# Patient Record
Sex: Female | Born: 1935
Health system: Southern US, Community
[De-identification: ages and names within clinical notes are randomized; demographics above are authoritative.]

## PROBLEM LIST (undated history)

## (undated) DIAGNOSIS — R7303 Prediabetes: Secondary | ICD-10-CM

## (undated) DIAGNOSIS — J302 Other seasonal allergic rhinitis: Secondary | ICD-10-CM

## (undated) DIAGNOSIS — I10 Essential (primary) hypertension: Secondary | ICD-10-CM

## (undated) DIAGNOSIS — E119 Type 2 diabetes mellitus without complications: Secondary | ICD-10-CM

## (undated) DIAGNOSIS — G459 Transient cerebral ischemic attack, unspecified: Secondary | ICD-10-CM

## (undated) DIAGNOSIS — Z8679 Personal history of other diseases of the circulatory system: Secondary | ICD-10-CM

## (undated) DIAGNOSIS — M1990 Unspecified osteoarthritis, unspecified site: Secondary | ICD-10-CM

## (undated) DIAGNOSIS — Z87891 Personal history of nicotine dependence: Secondary | ICD-10-CM

## (undated) DIAGNOSIS — N189 Chronic kidney disease, unspecified: Secondary | ICD-10-CM

## (undated) HISTORY — DX: Personal history of nicotine dependence: Z87.891

## (undated) HISTORY — DX: Transient cerebral ischemic attack, unspecified: G45.9

## (undated) HISTORY — DX: Essential (primary) hypertension: I10

## (undated) HISTORY — PX: TONSILLECTOMY: SUR1361

---

## 1997-08-22 ENCOUNTER — Ambulatory Visit (HOSPITAL_COMMUNITY): Admission: RE | Admit: 1997-08-22 | Discharge: 1997-08-22 | Payer: Self-pay | Admitting: Cardiology

## 2000-04-17 ENCOUNTER — Encounter (INDEPENDENT_AMBULATORY_CARE_PROVIDER_SITE_OTHER): Payer: Self-pay | Admitting: *Deleted

## 2000-04-17 ENCOUNTER — Ambulatory Visit (HOSPITAL_COMMUNITY): Admission: RE | Admit: 2000-04-17 | Discharge: 2000-04-17 | Payer: Self-pay | Admitting: Gastroenterology

## 2000-07-08 ENCOUNTER — Encounter: Admission: RE | Admit: 2000-07-08 | Discharge: 2000-07-08 | Payer: Self-pay | Admitting: Family Medicine

## 2000-07-08 ENCOUNTER — Encounter: Payer: Self-pay | Admitting: Family Medicine

## 2003-12-07 ENCOUNTER — Ambulatory Visit (HOSPITAL_COMMUNITY): Admission: RE | Admit: 2003-12-07 | Discharge: 2003-12-07 | Payer: Self-pay | Admitting: Gastroenterology

## 2005-12-10 ENCOUNTER — Ambulatory Visit (HOSPITAL_COMMUNITY): Admission: RE | Admit: 2005-12-10 | Discharge: 2005-12-10 | Payer: Self-pay | Admitting: Cardiology

## 2005-12-10 IMAGING — CT CT HEAD WO/W CM
2 of 3 series · 16 of 30 positions shown, 18 images · IV contrast (omnipaque)
Comparison: Report of MRI of [DATE].

CLINICAL DATA: 80-year-old female with recurrent TIAs and visual disturbance.  Decreased vision in the left eye.   No eye bleed or CVA. 
 HEAD CT WITHOUT AND WITH CONTRAST:
TECHNIQUE: Contiguous axial images were obtained from the base of the skull through the vertex according to standard protocol before and after administration of intravenous contrast.
 Contrast:  100 mL Omnipaque 300.

[Series 2: brain · axial · 0.47mm/px · z∈[+166,+274]mm · 8 of 28 slices shown, 10 images (1 of 2)]
[im 4/28  brain]
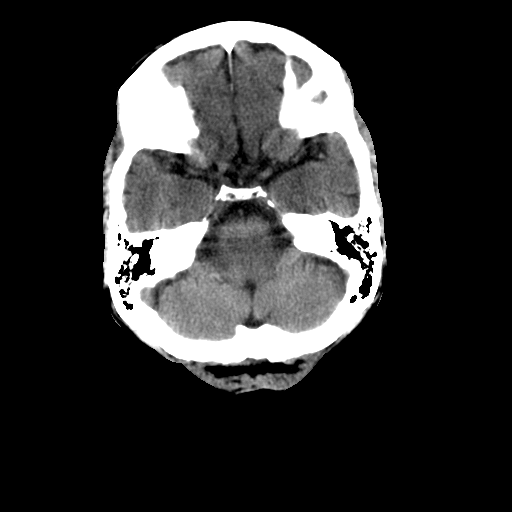
[im 4/28  bone]
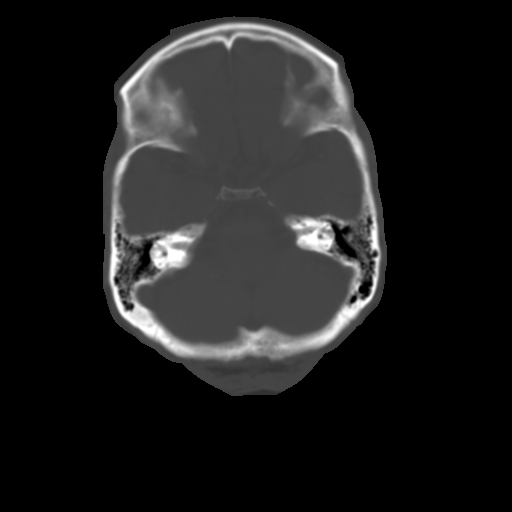
[im 7/28  brain]
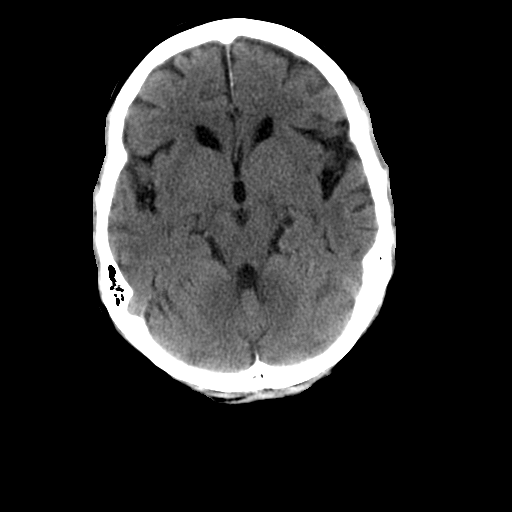
[im 10/28  brain]
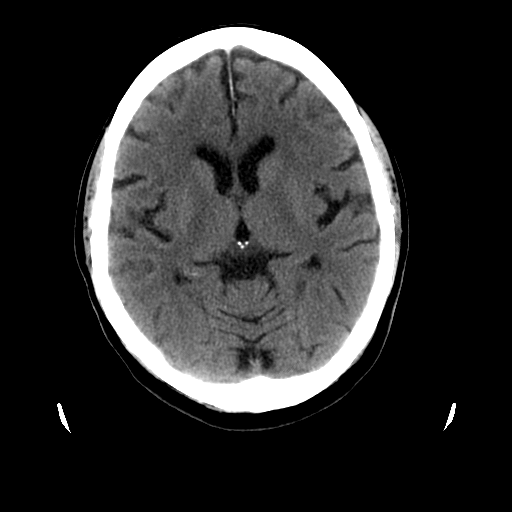
[im 13/28  brain]
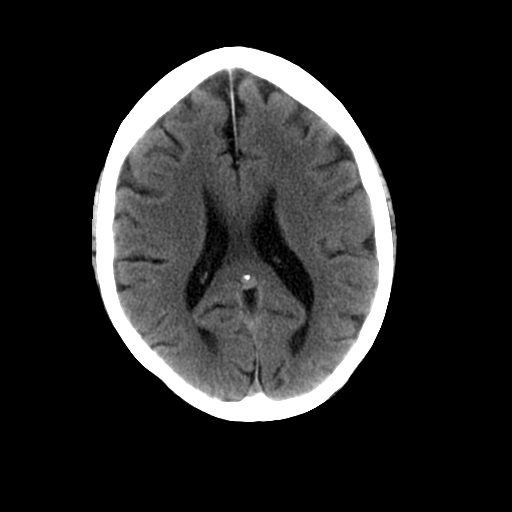
[im 16/28  brain]
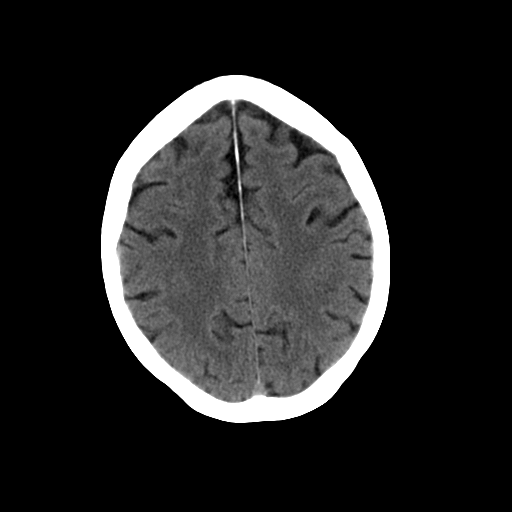
[im 16/28  bone]
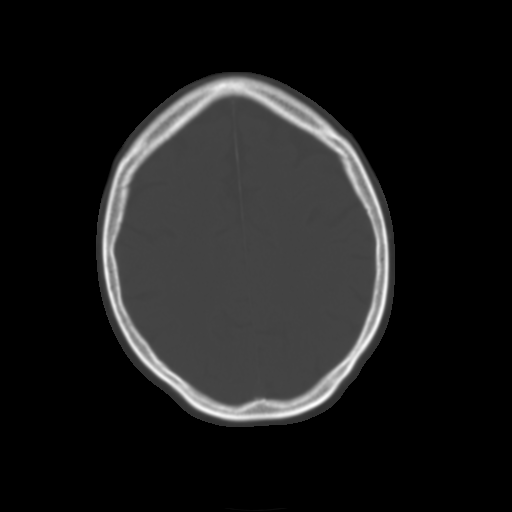
[im 19/28  brain]
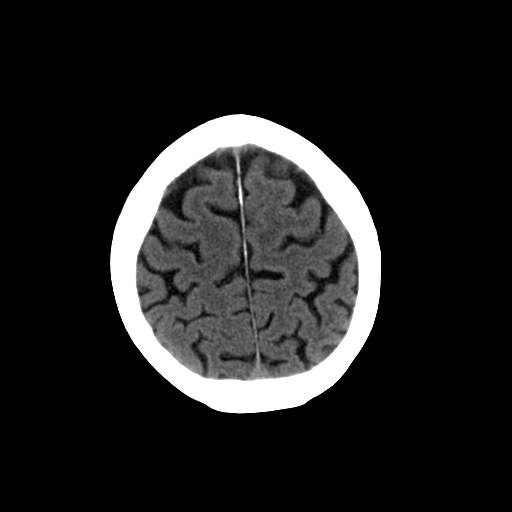
[im 22/28  brain]
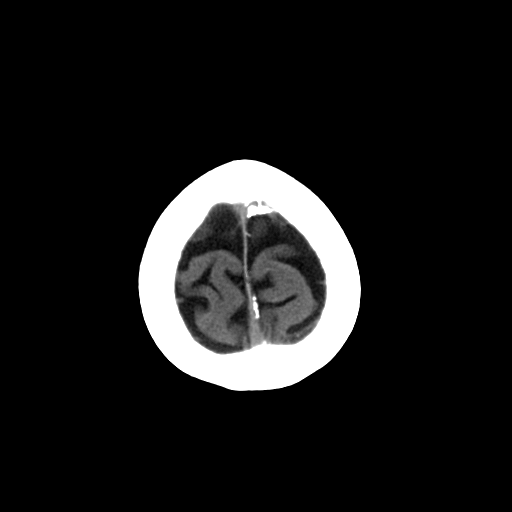
[im 25/28  brain]
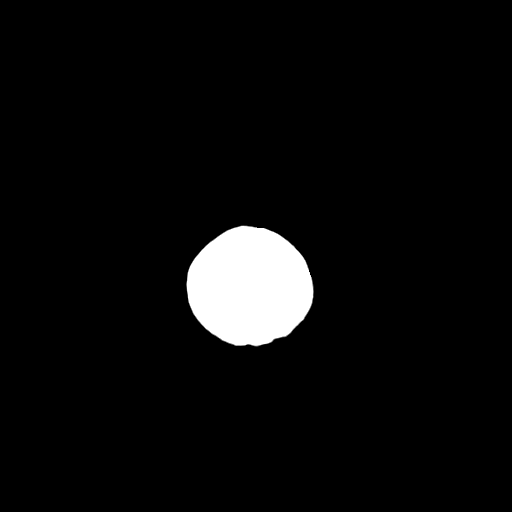

[Series 3: brain · axial · 0.47mm/px · z∈[+166,+274]mm · 8 of 28 slices shown (2 of 2)]
[im 4/28  brain]
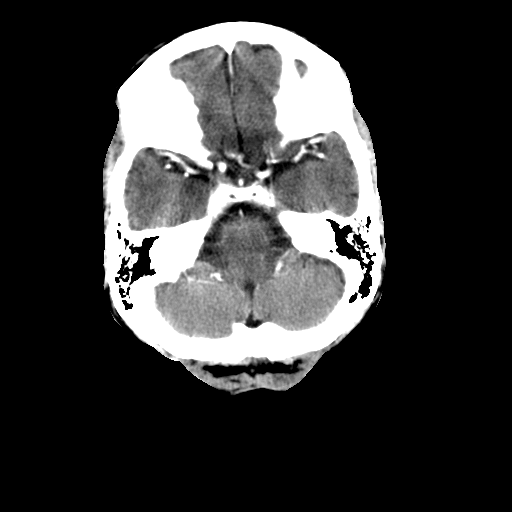
[im 7/28  brain]
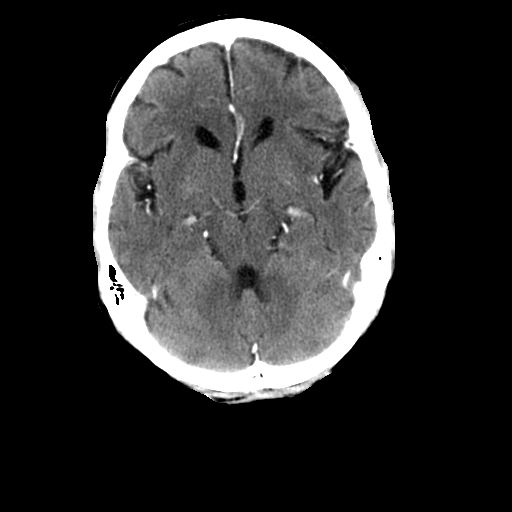
[im 10/28  brain]
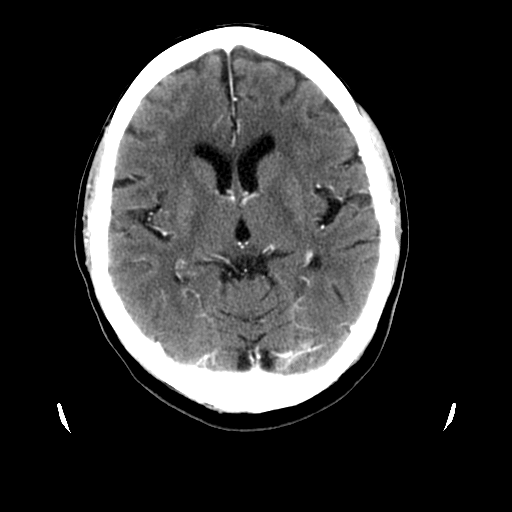
[im 13/28  brain]
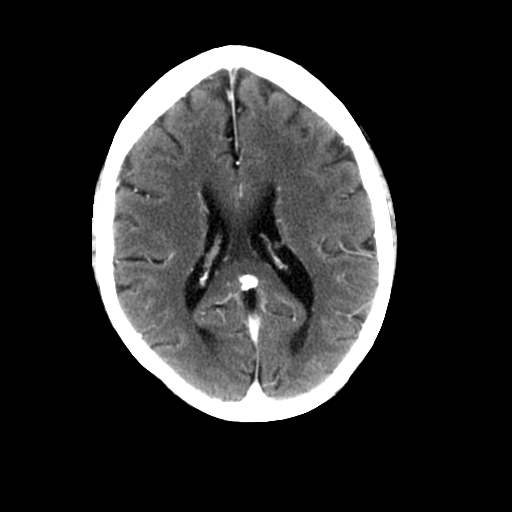
[im 16/28  brain]
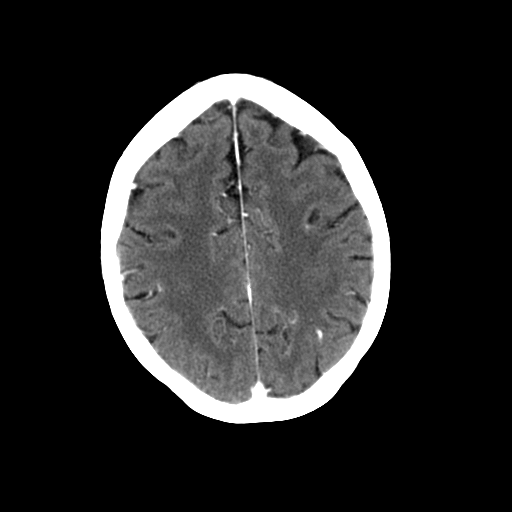
[im 19/28  brain]
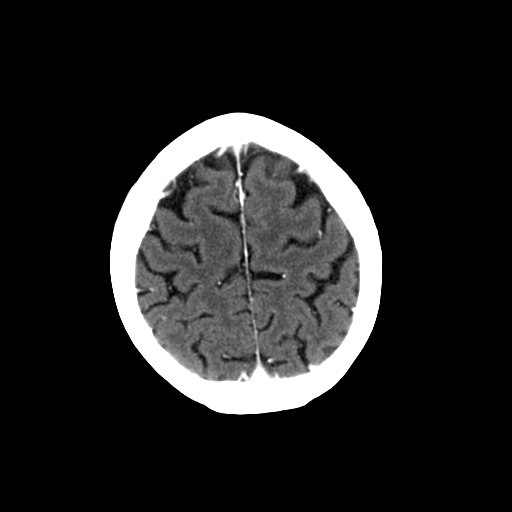
[im 22/28  brain]
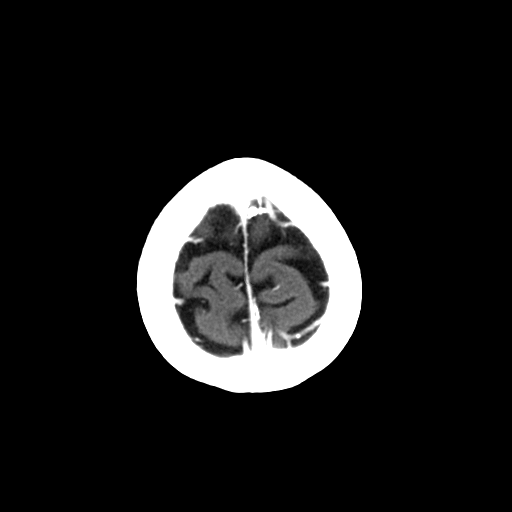
[im 25/28  brain]
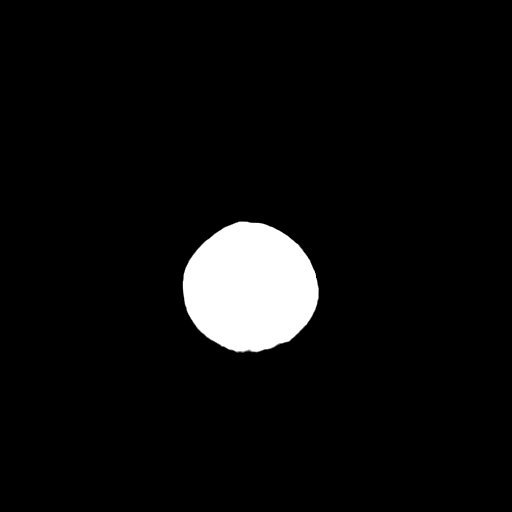

[16 of 30 positions shown; findings below may reference images not displayed]

Images are no longer available.  Of interest, the patient's complaint at the time of that MRI scan was blurred vision in the left eye.
FINDINGS: No acute intracranial abnormality is present.  Specifically, there is no evidence for acute infarct, hemorrhage, mass, hydrocephalus, or extraaxial fluid collection.  There are scattered ill-defined hypodensities in the subcortical white matter bilaterally.  The most prominent area is in the left frontal operculum.  The basal ganglia are intact.  The globes are not imaged. 
 Postcontrast images demonstrate no areas of pathologic enhancement. 
 The paranasal sinuses and mastoid air cells are clear.
IMPRESSION: 1.  Minimal subcortical white matter disease.
 2.  No acute intracranial abnormality or areas of pathologic enhancement. 
 3.  CT is known to be insensitive for acute infarcts in the first 24-48 hours.  MRI would be more sensitive and specific for the workup of TIA if clinically indicated.

## 2005-12-18 HISTORY — PX: TRANSTHORACIC ECHOCARDIOGRAM: SHX275

## 2005-12-24 ENCOUNTER — Encounter: Admission: RE | Admit: 2005-12-24 | Discharge: 2005-12-24 | Payer: Self-pay | Admitting: Family Medicine

## 2005-12-24 IMAGING — CR DG SHOULDER 2+V*L*
3 series · 3 of 3 positions shown · non-contrast
Comparison: none

CLINICAL DATA: Three weeks left shoulder pain.  No specific injury. 
 LEFT SHOULDER THREE VIEWS:
 There is no evidence of fracture or dislocation.  No other significant bone or soft tissue abnormalities are identified. 
 IMPRESSION
 Normal study.

[w shoulder ap internal left]
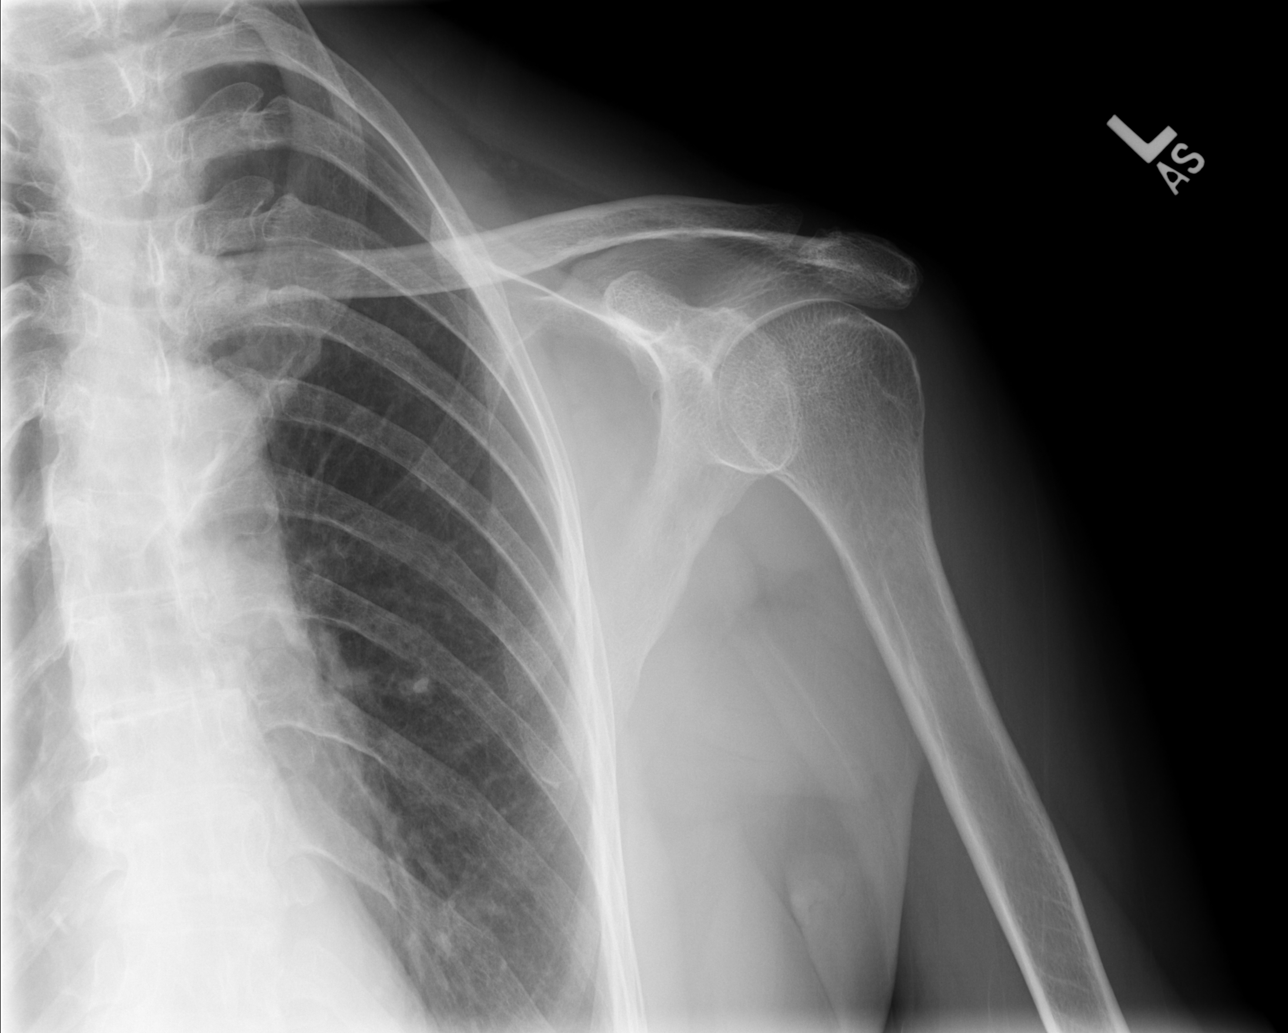

[w shoulder ap external left]
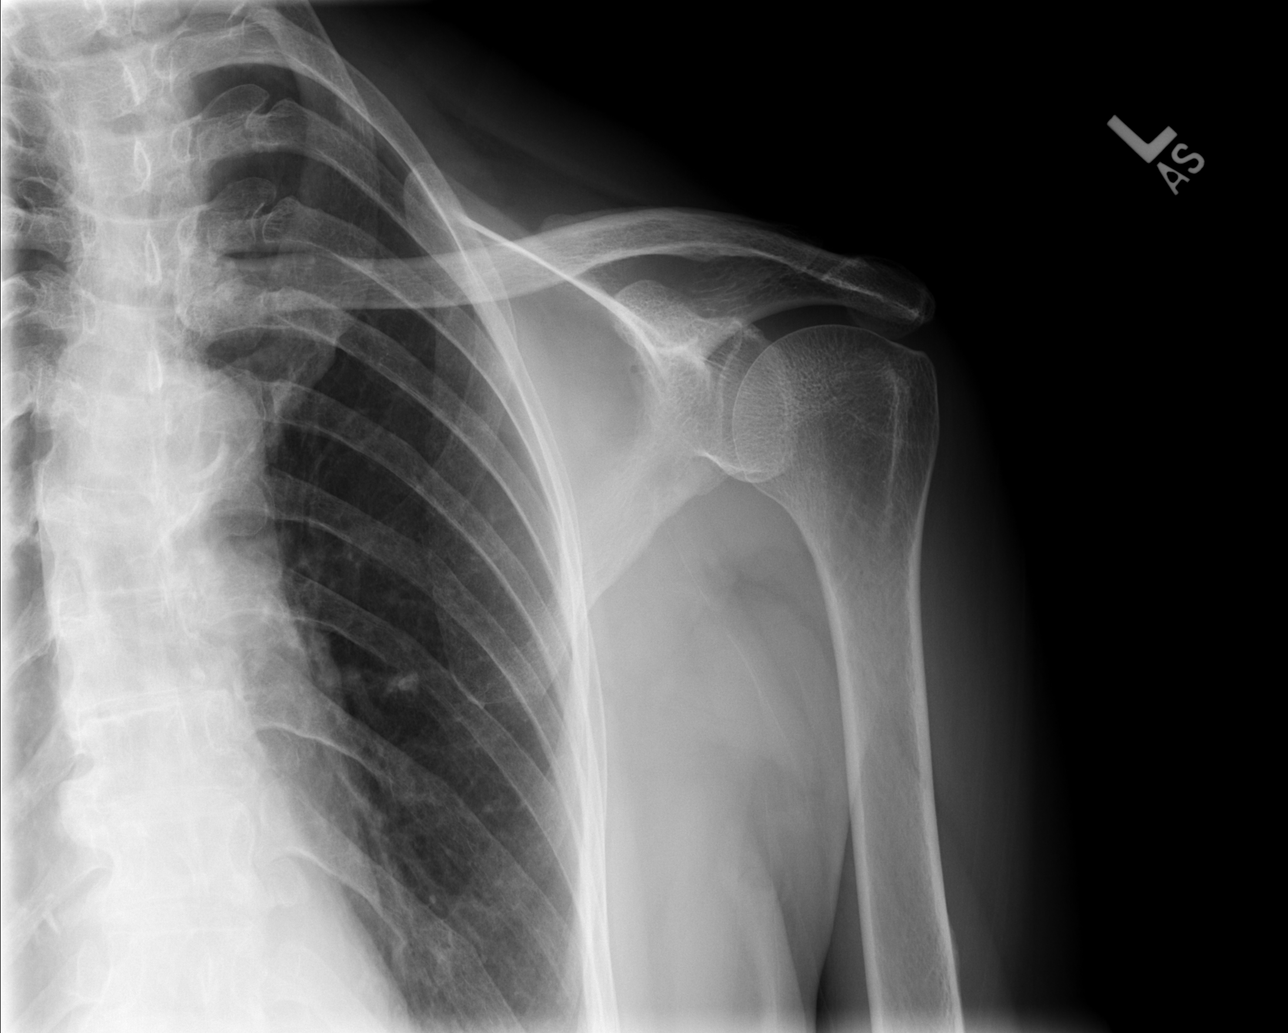

[w shoulder axillary left *]
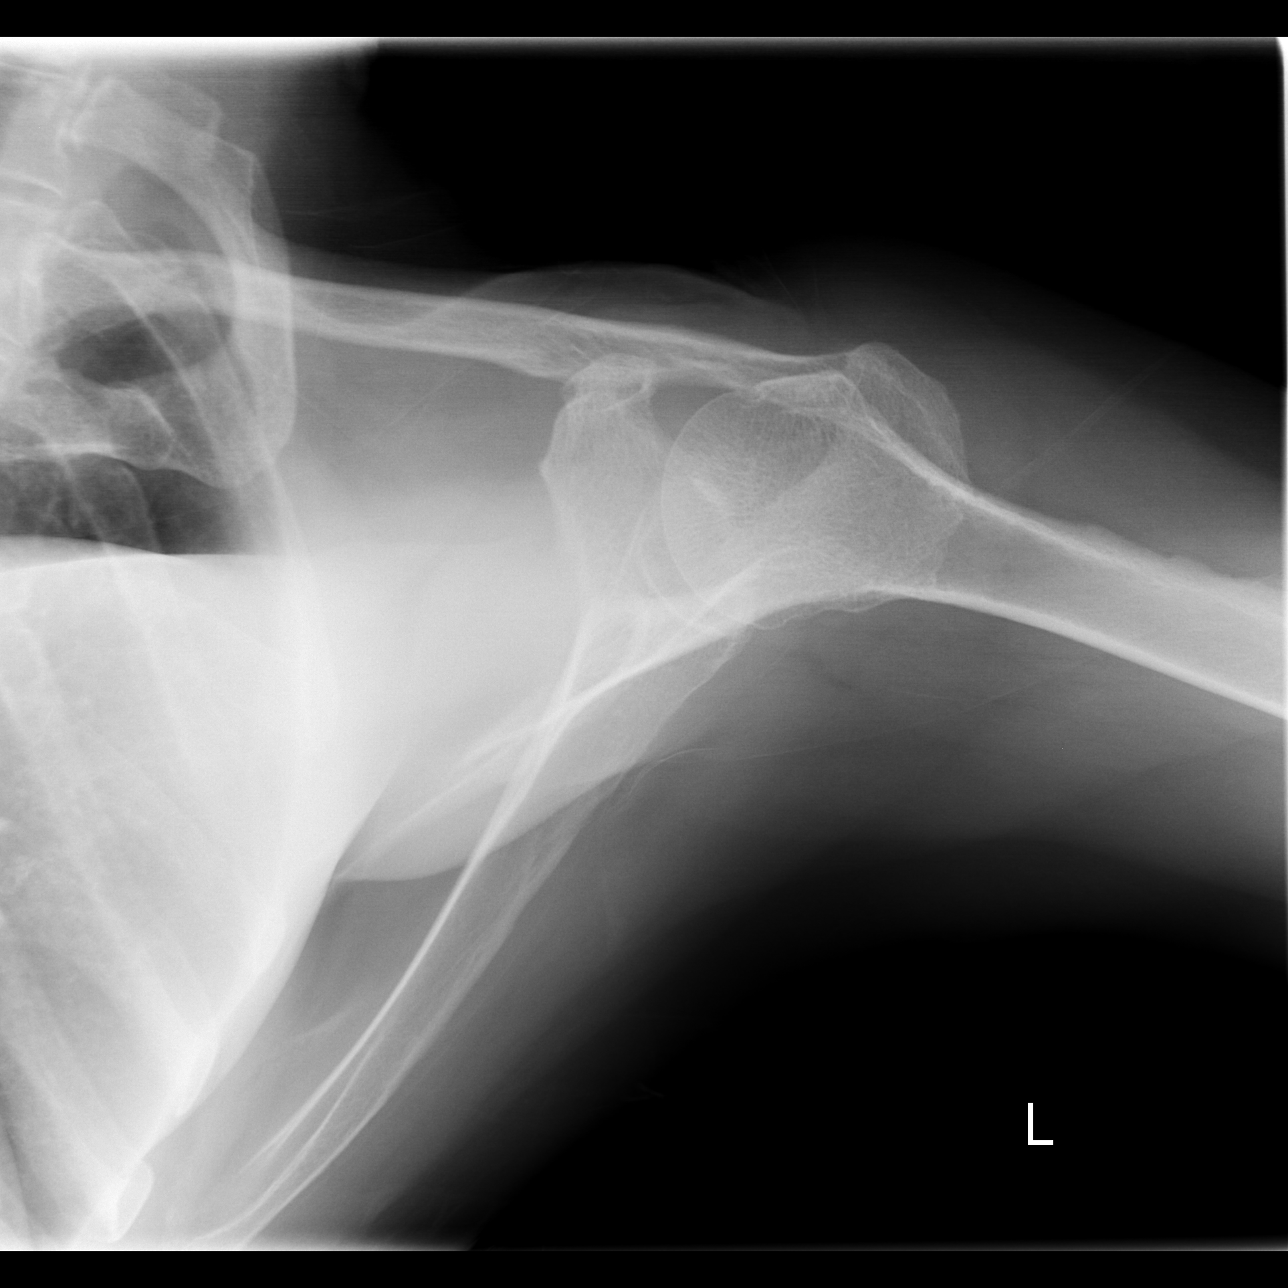

[3 of 3 positions shown; findings below may reference images not displayed]

## 2005-12-27 ENCOUNTER — Encounter: Admission: RE | Admit: 2005-12-27 | Discharge: 2005-12-27 | Payer: Self-pay | Admitting: Family Medicine

## 2005-12-27 IMAGING — US US CAROTID DUPLEX BILAT
1 series · 14 of 24 positions shown · non-contrast
Comparison: none

CLINICAL DATA: TIA.  Visual losses. 
 BILATERAL CAROTID DUPLEX ULTRASOUND: 
 Velocities are as follows (cm per second):
 SITE  PEAK SYSTOLIC  END-DIASTOLIC
 RIGHT ICA    86  21
 RIGHT CCA  61  10
 RIGHT ICA/CCA RATIO
 RIGHT ECA  66
 LEFT ICA  91  27
 LEFT CCA    81  10
 LEFT ICA/CCA RATIO
 LEFT ECA    101

[Series 1: unknown · 0.07mm/px · 14 of 64 slices shown]
[im 1/64]
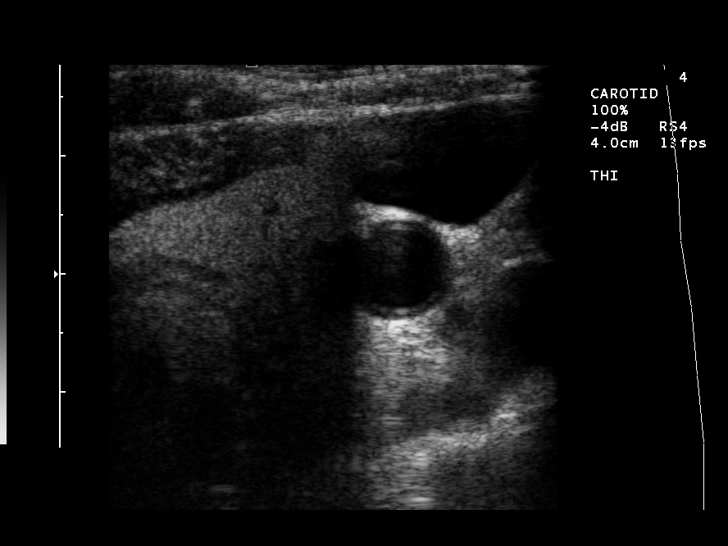
[im 6/64]
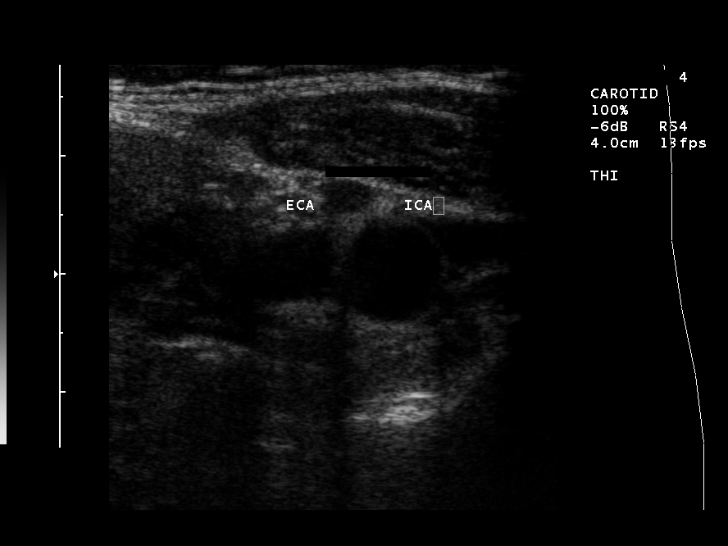
[im 11/64]
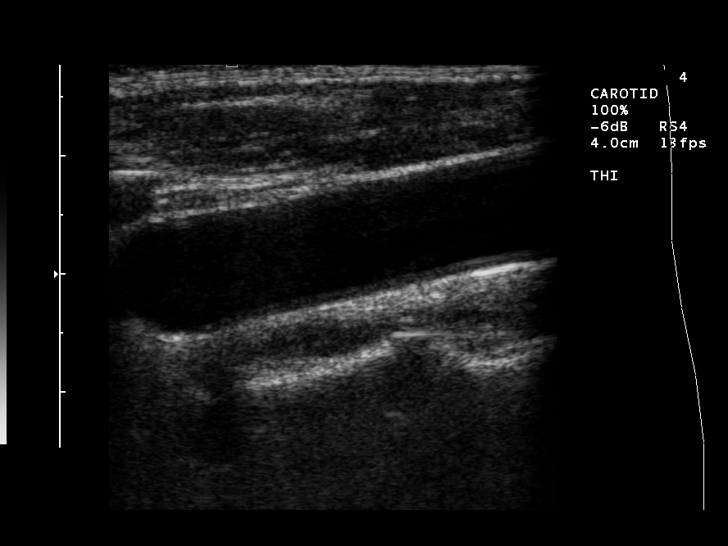
[im 17/64]
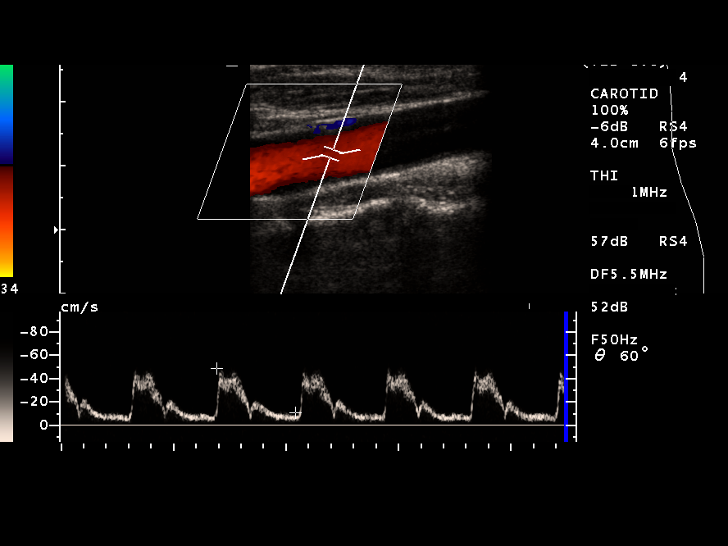
[im 20/64]
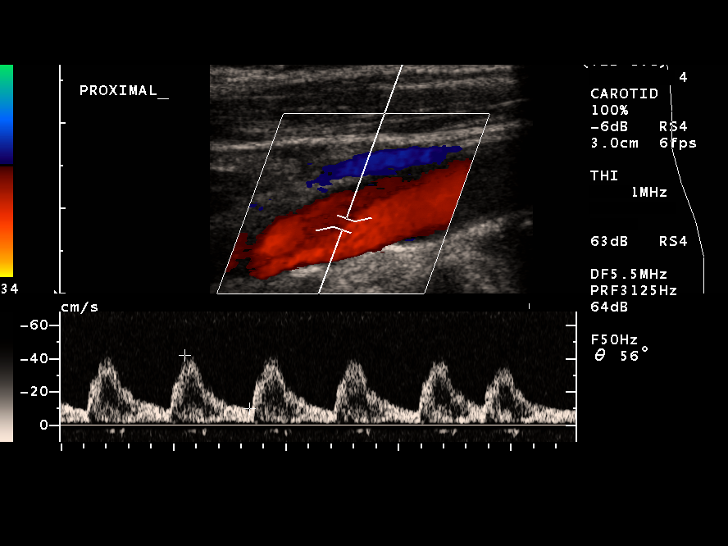
[im 25/64]
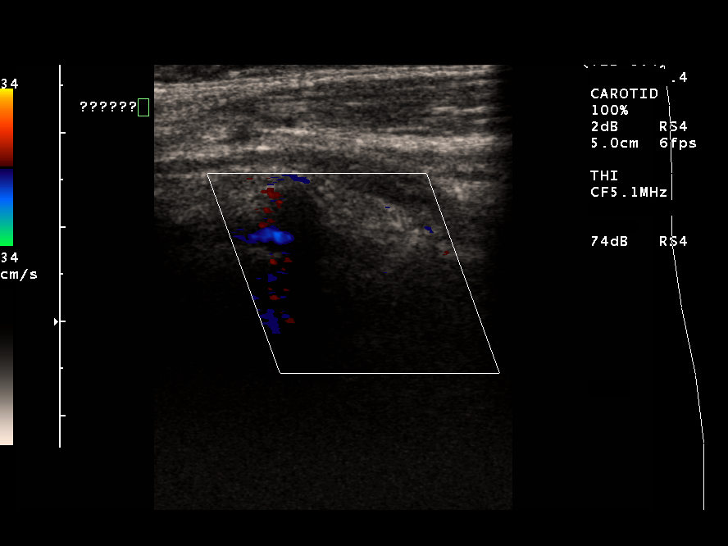
[im 31/64]
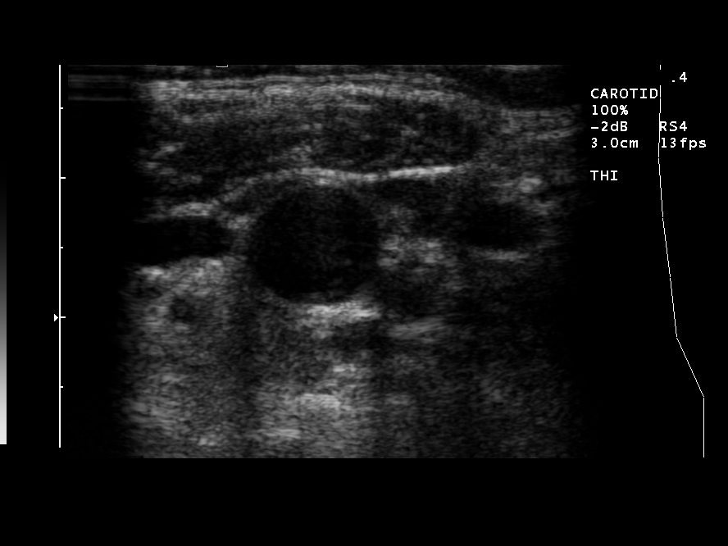
[im 33/64]
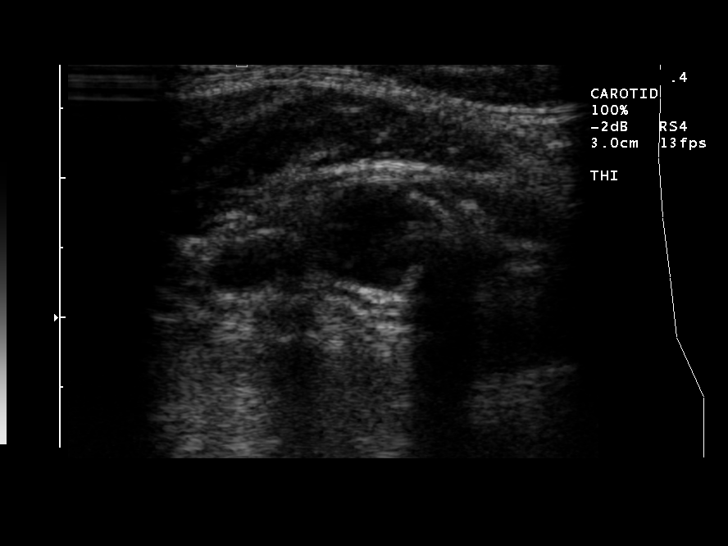
[im 39/64]
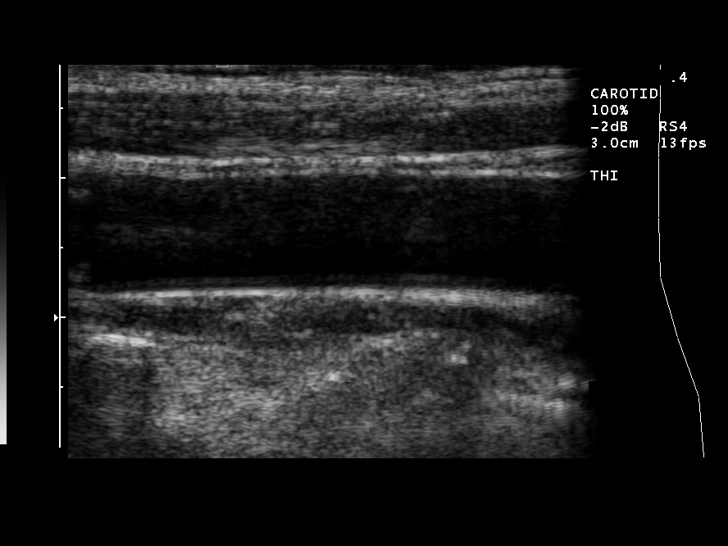
[im 44/64]
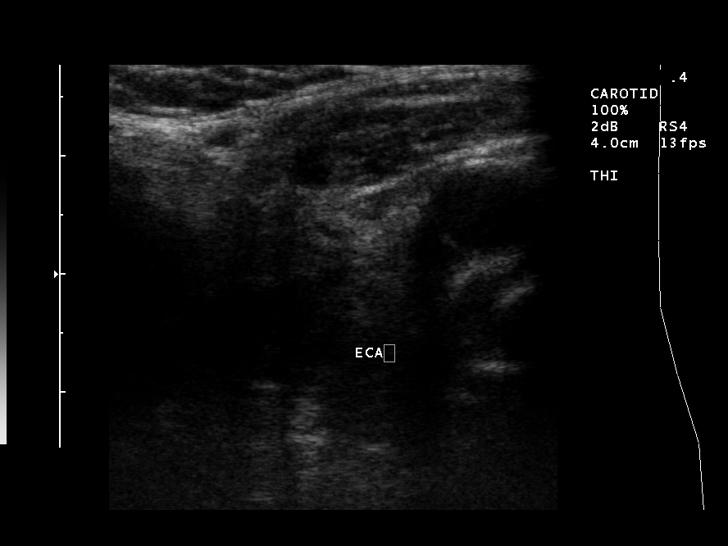
[im 50/64]
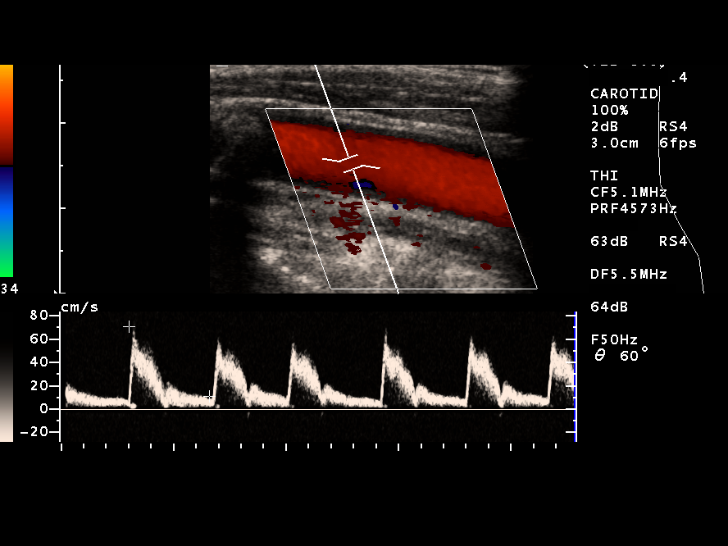
[im 53/64]
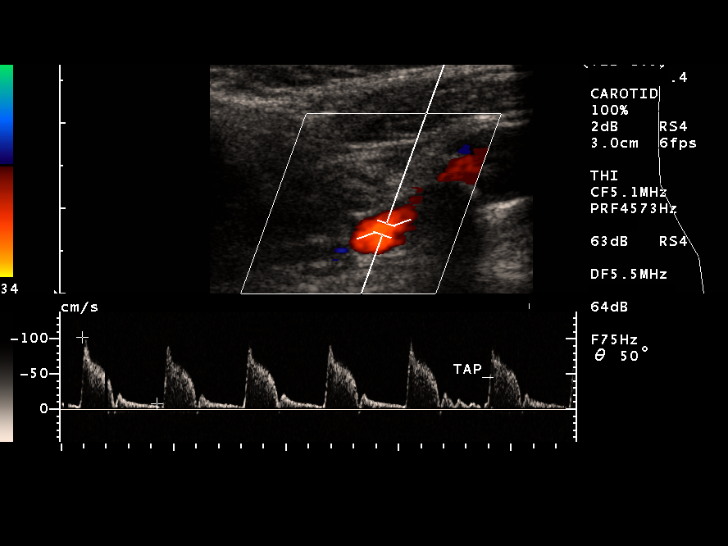
[im 58/64]
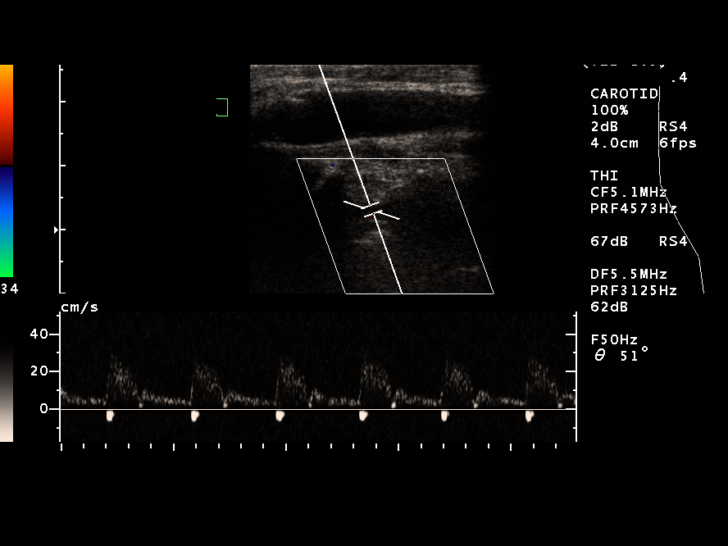
[im 64/64]
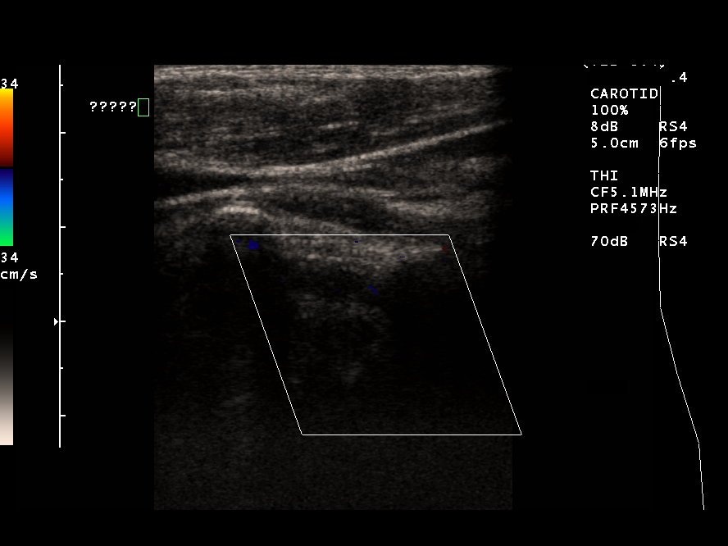

[14 of 24 positions shown; findings below may reference images not displayed]

FINDINGS: There is mild soft plaque in the right ICA bulb.  Doppler analysis demonstrates a low resistance waveform with a sharp upstroke.  The right vertebral artery was very poorly visualized.  Both antegrade and retrograde flow is suspected. 
 There is minimal soft and calcified plaque in the left ICA bulb.  Doppler analysis demonstrates a low resistance waveform with sharp upstroke.  The left vertebral artery is antegrade in flow.
IMPRESSION: 1.  Estimated stenosis in the right and left ICA?s is 0 to 50% and 0 to 50% respectively.
 2.  There is abnormal flow in the right vertebral artery as described.  Dedicated angiography can be performed to further delineate.

## 2006-05-30 ENCOUNTER — Encounter: Admission: RE | Admit: 2006-05-30 | Discharge: 2006-05-30 | Payer: Self-pay | Admitting: Family Medicine

## 2006-05-30 IMAGING — CR DG TMJ OPEN & CLOSE BILAT
6 series · 6 of 6 positions shown · non-contrast
Comparison: none

CLINICAL DATA: Left sided TMJ pain for four days. 
 TEMPOROMANDIBULAR JOINTS WITH OPEN AND CLOSED VIEWS - 6 VIEW:

[view not recorded (1 of 6)]
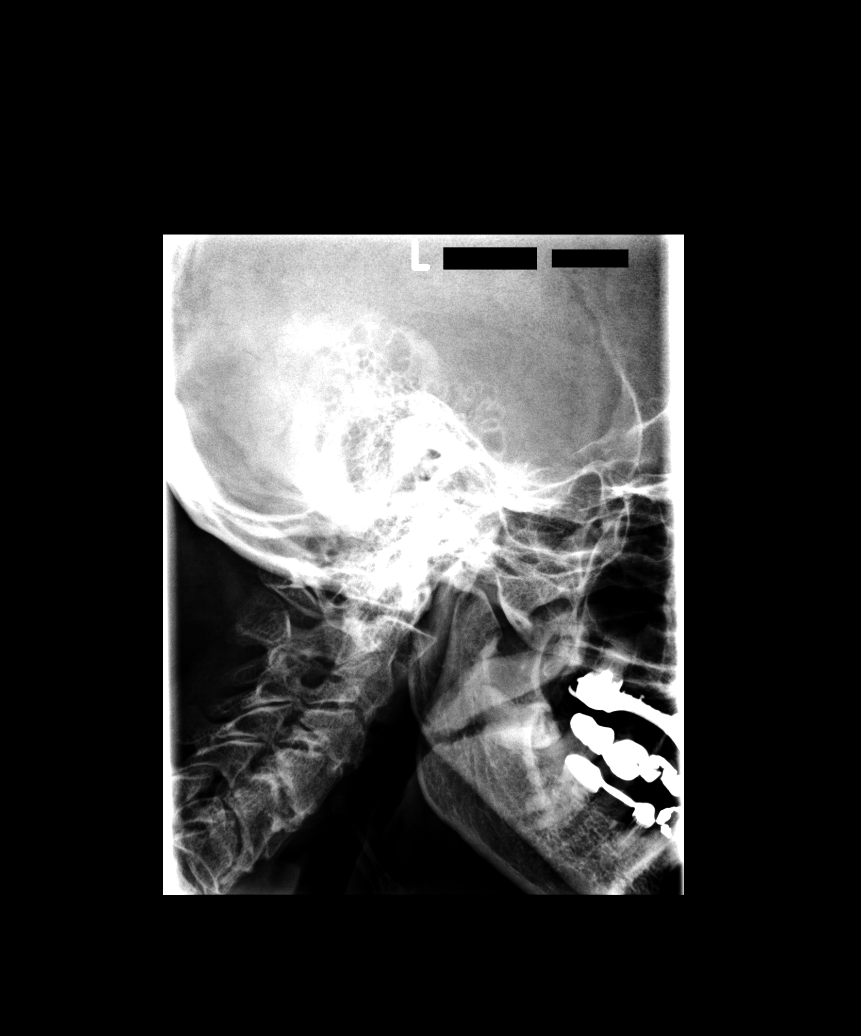

[view not recorded (2 of 6)]
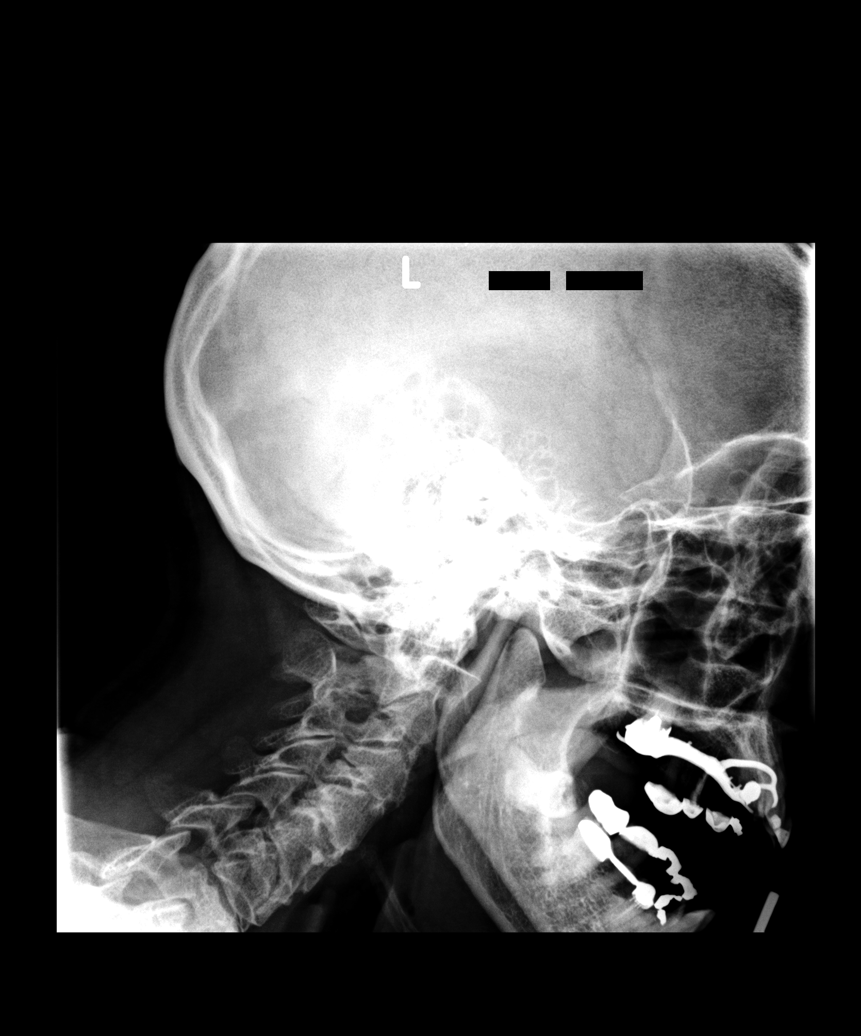

[view not recorded (3 of 6)]
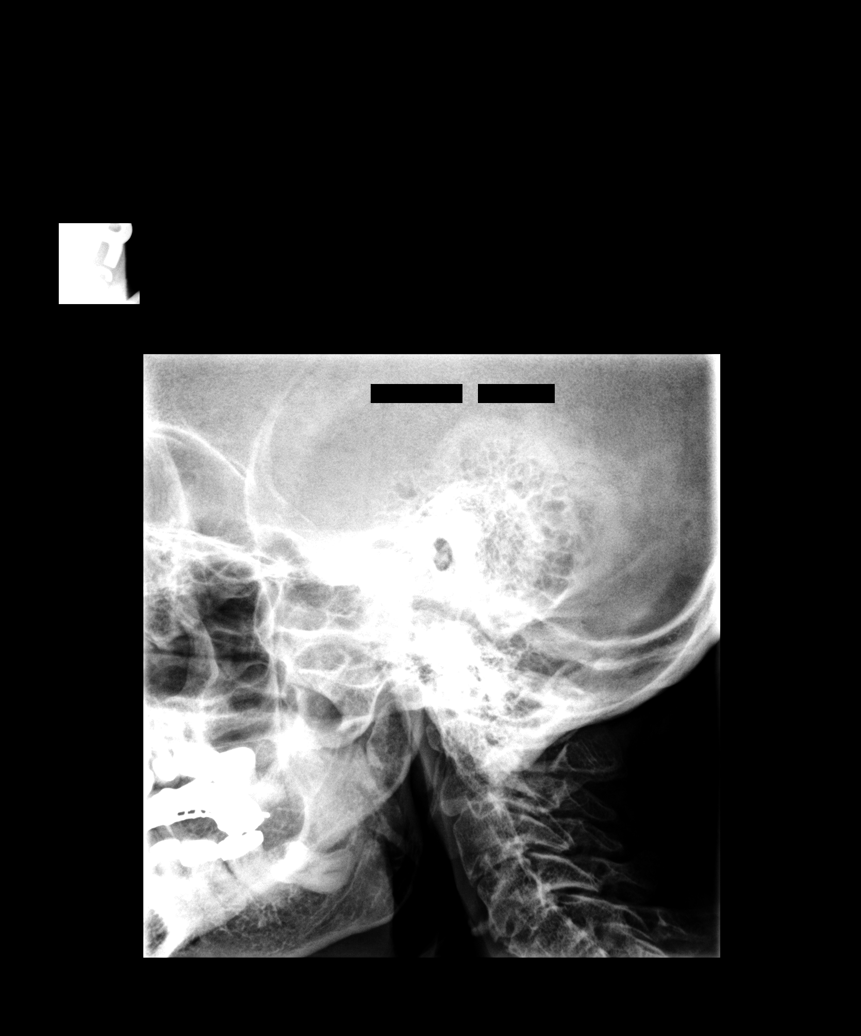

[view not recorded (4 of 6)]
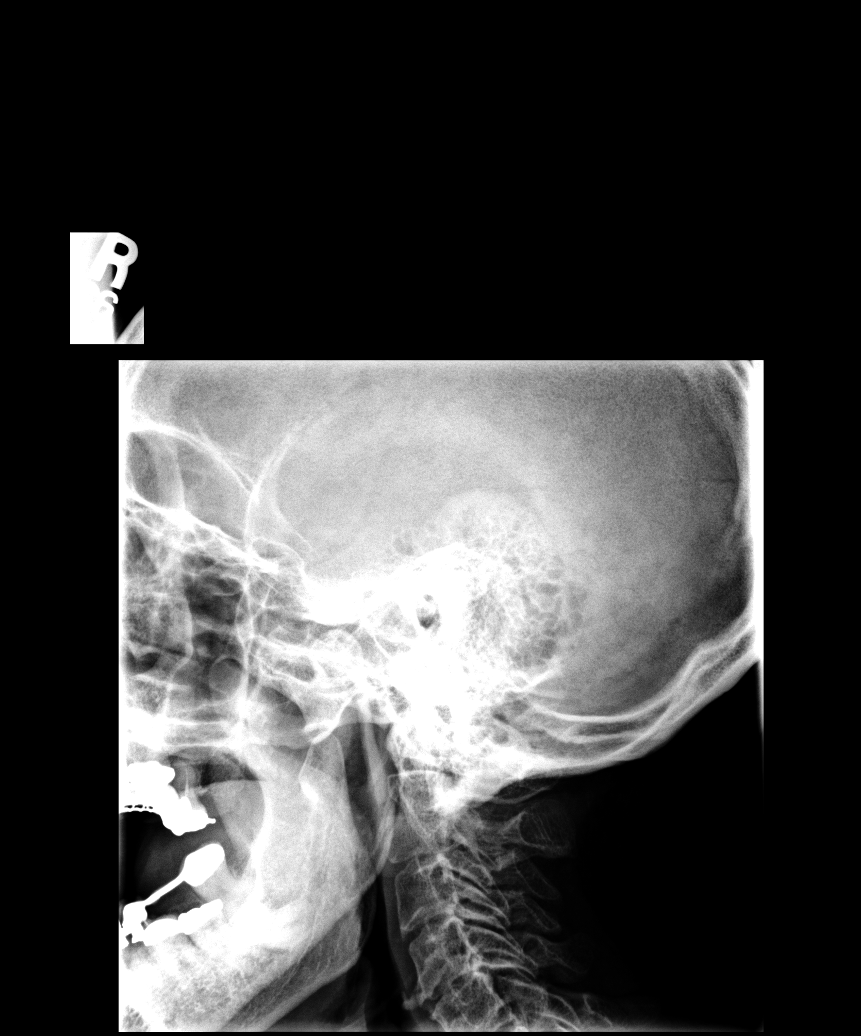

[view not recorded (5 of 6)]
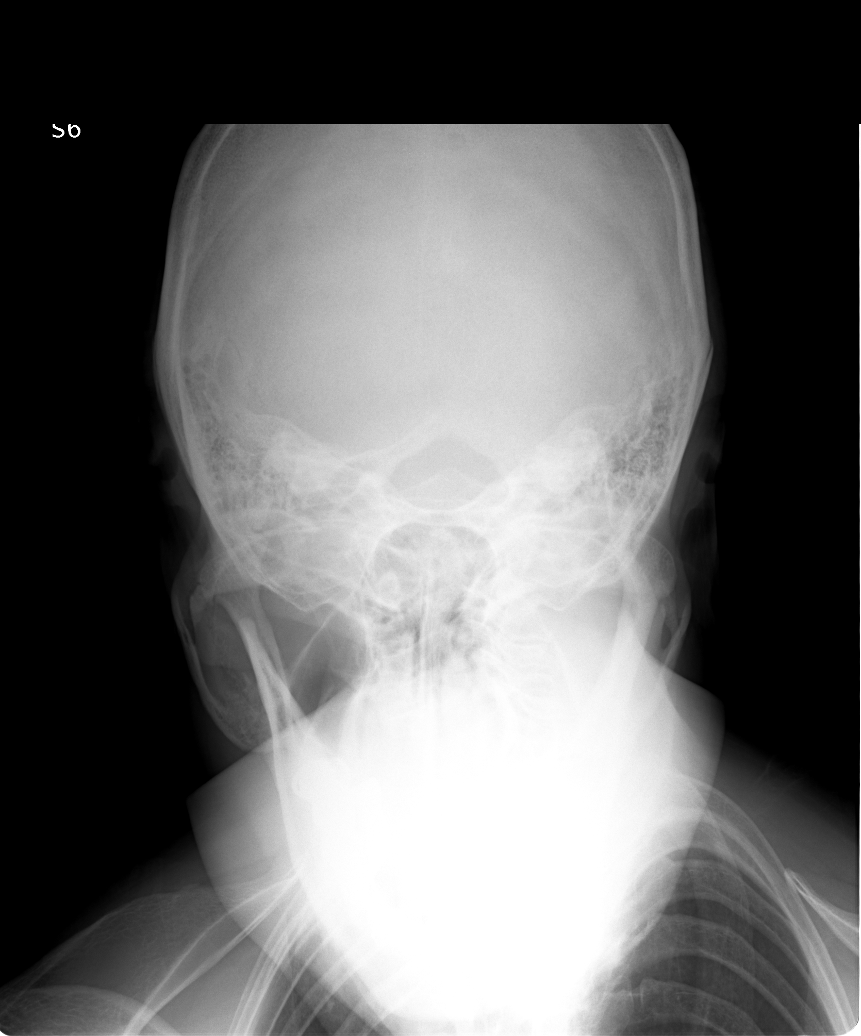

[view not recorded (6 of 6)]
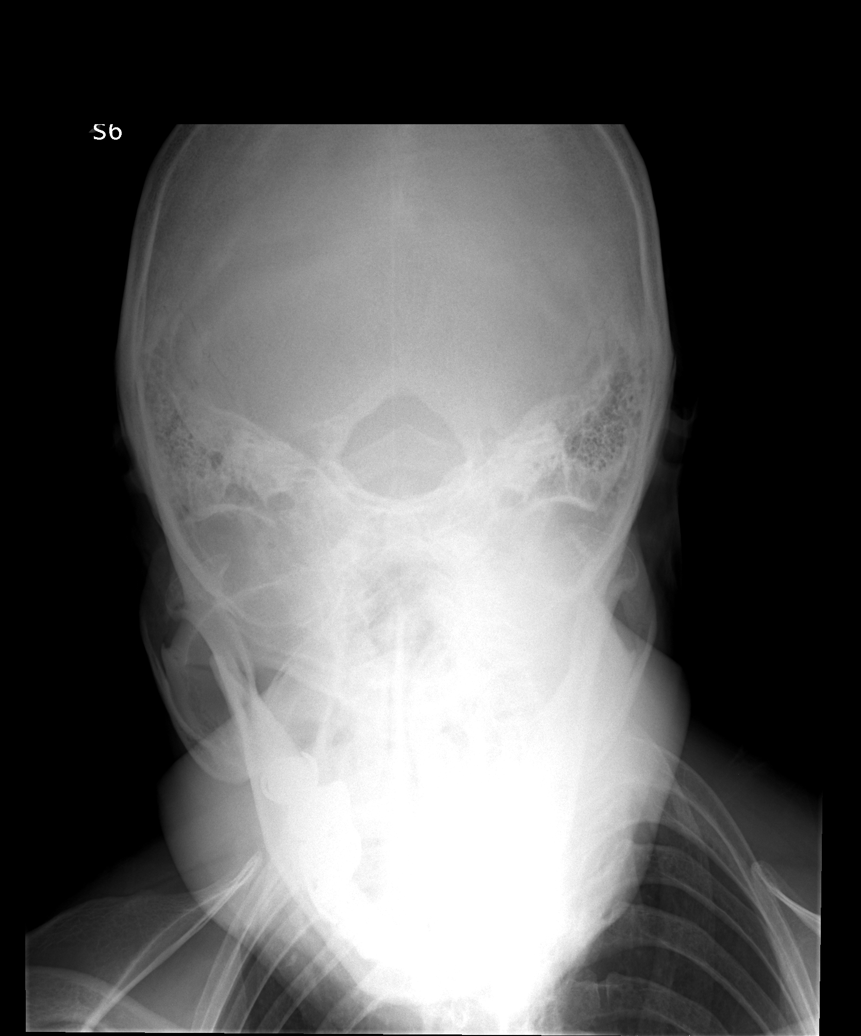

[6 of 6 positions shown; findings below may reference images not displayed]

FINDINGS: Both temporomandibular joints appear within normal limits radiographically.  I do not see any arthritic changes affecting either of the mandibular condyles or the temporal fossae.  The mandibular condyles move appropriately with opening and closing.
IMPRESSION: Normal plain film examination.  Of course, this does not address the possibility of meniscal disease.

## 2007-05-07 ENCOUNTER — Ambulatory Visit (HOSPITAL_COMMUNITY): Admission: RE | Admit: 2007-05-07 | Discharge: 2007-05-07 | Payer: Self-pay | Admitting: Ophthalmology

## 2007-05-07 IMAGING — CR DG CHEST 2V
2 series · 2 of 2 positions shown · non-contrast
Comparison: none

CLINICAL DATA: Iritis, hypertension.
CHEST - 2 VIEWS:

[w chest pa *]
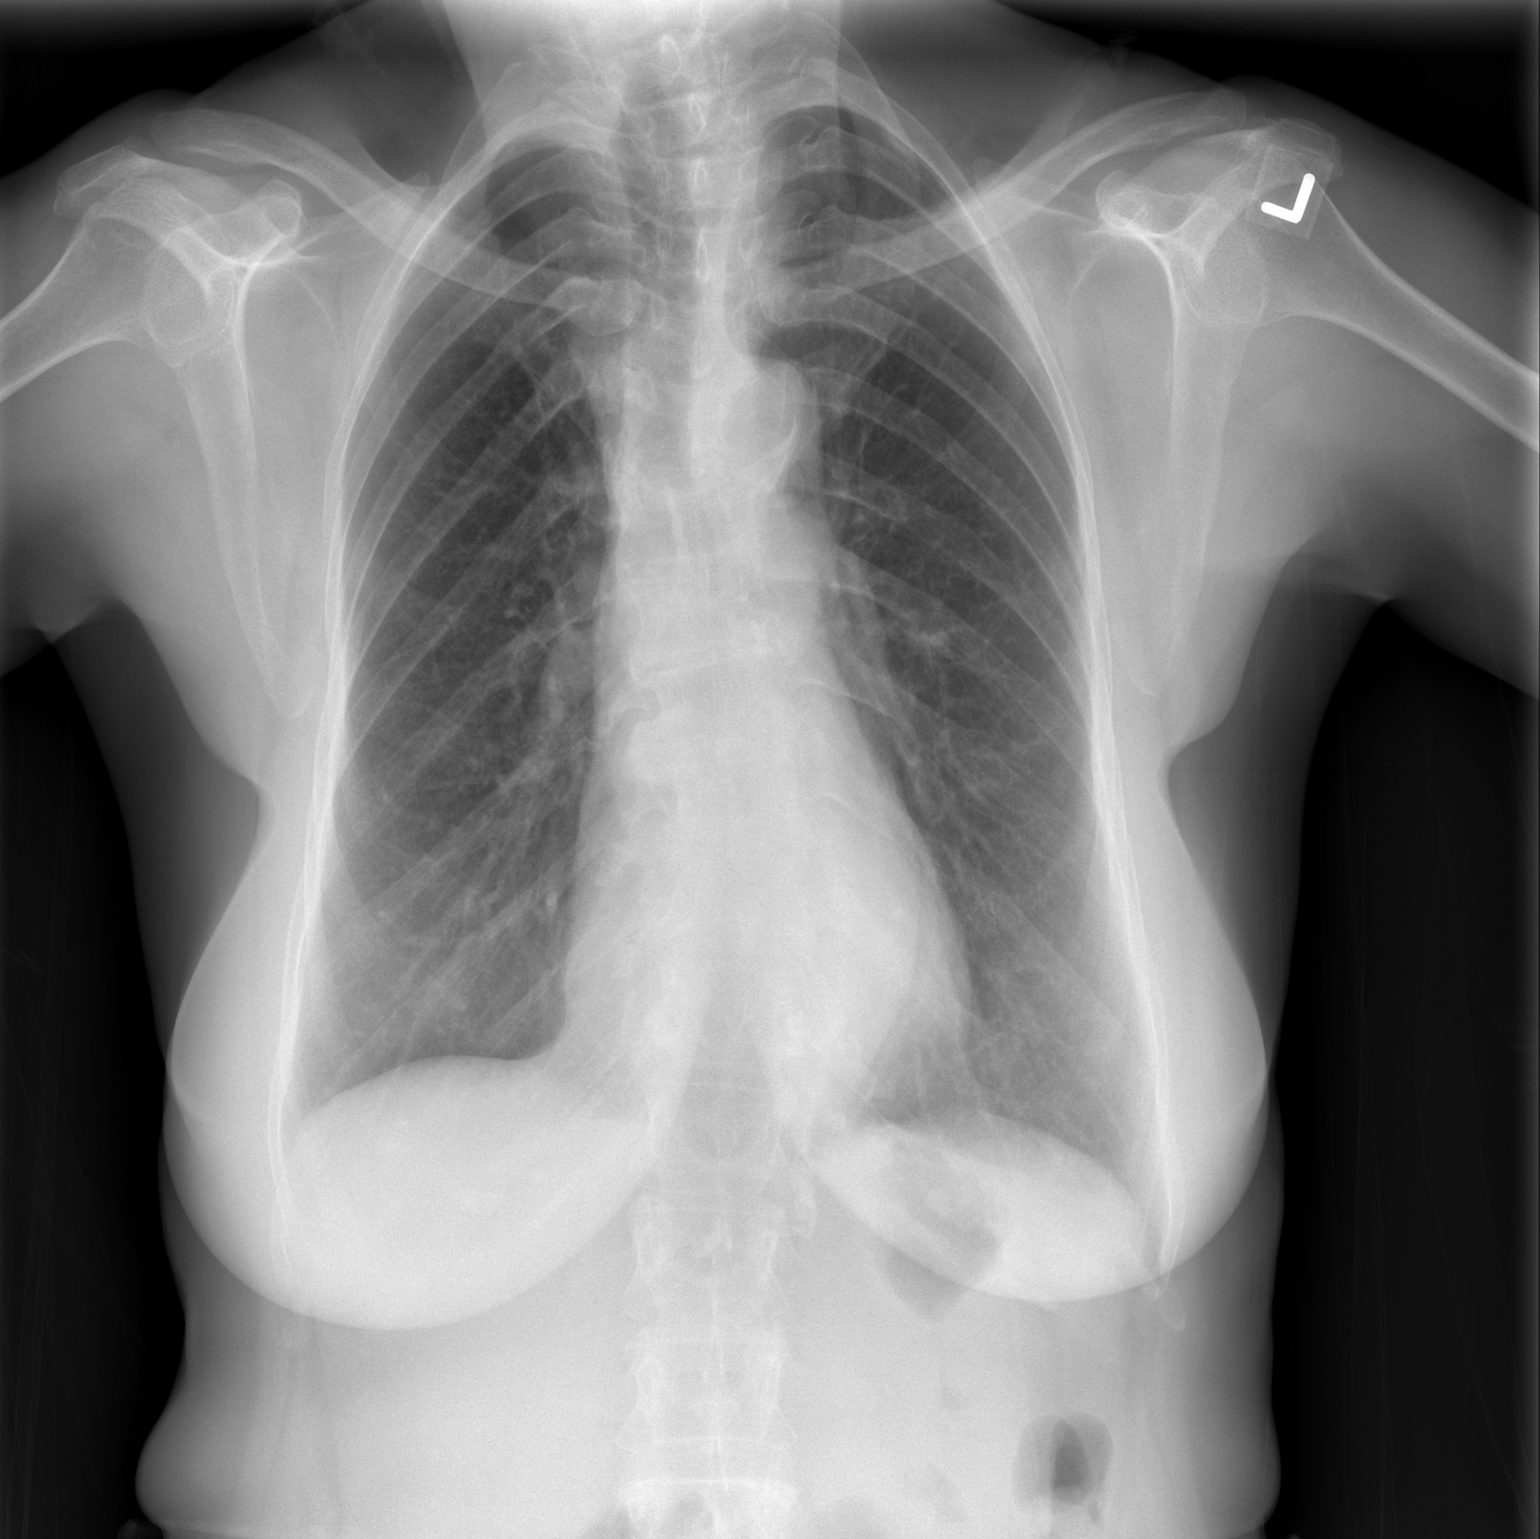

[w chest lat]
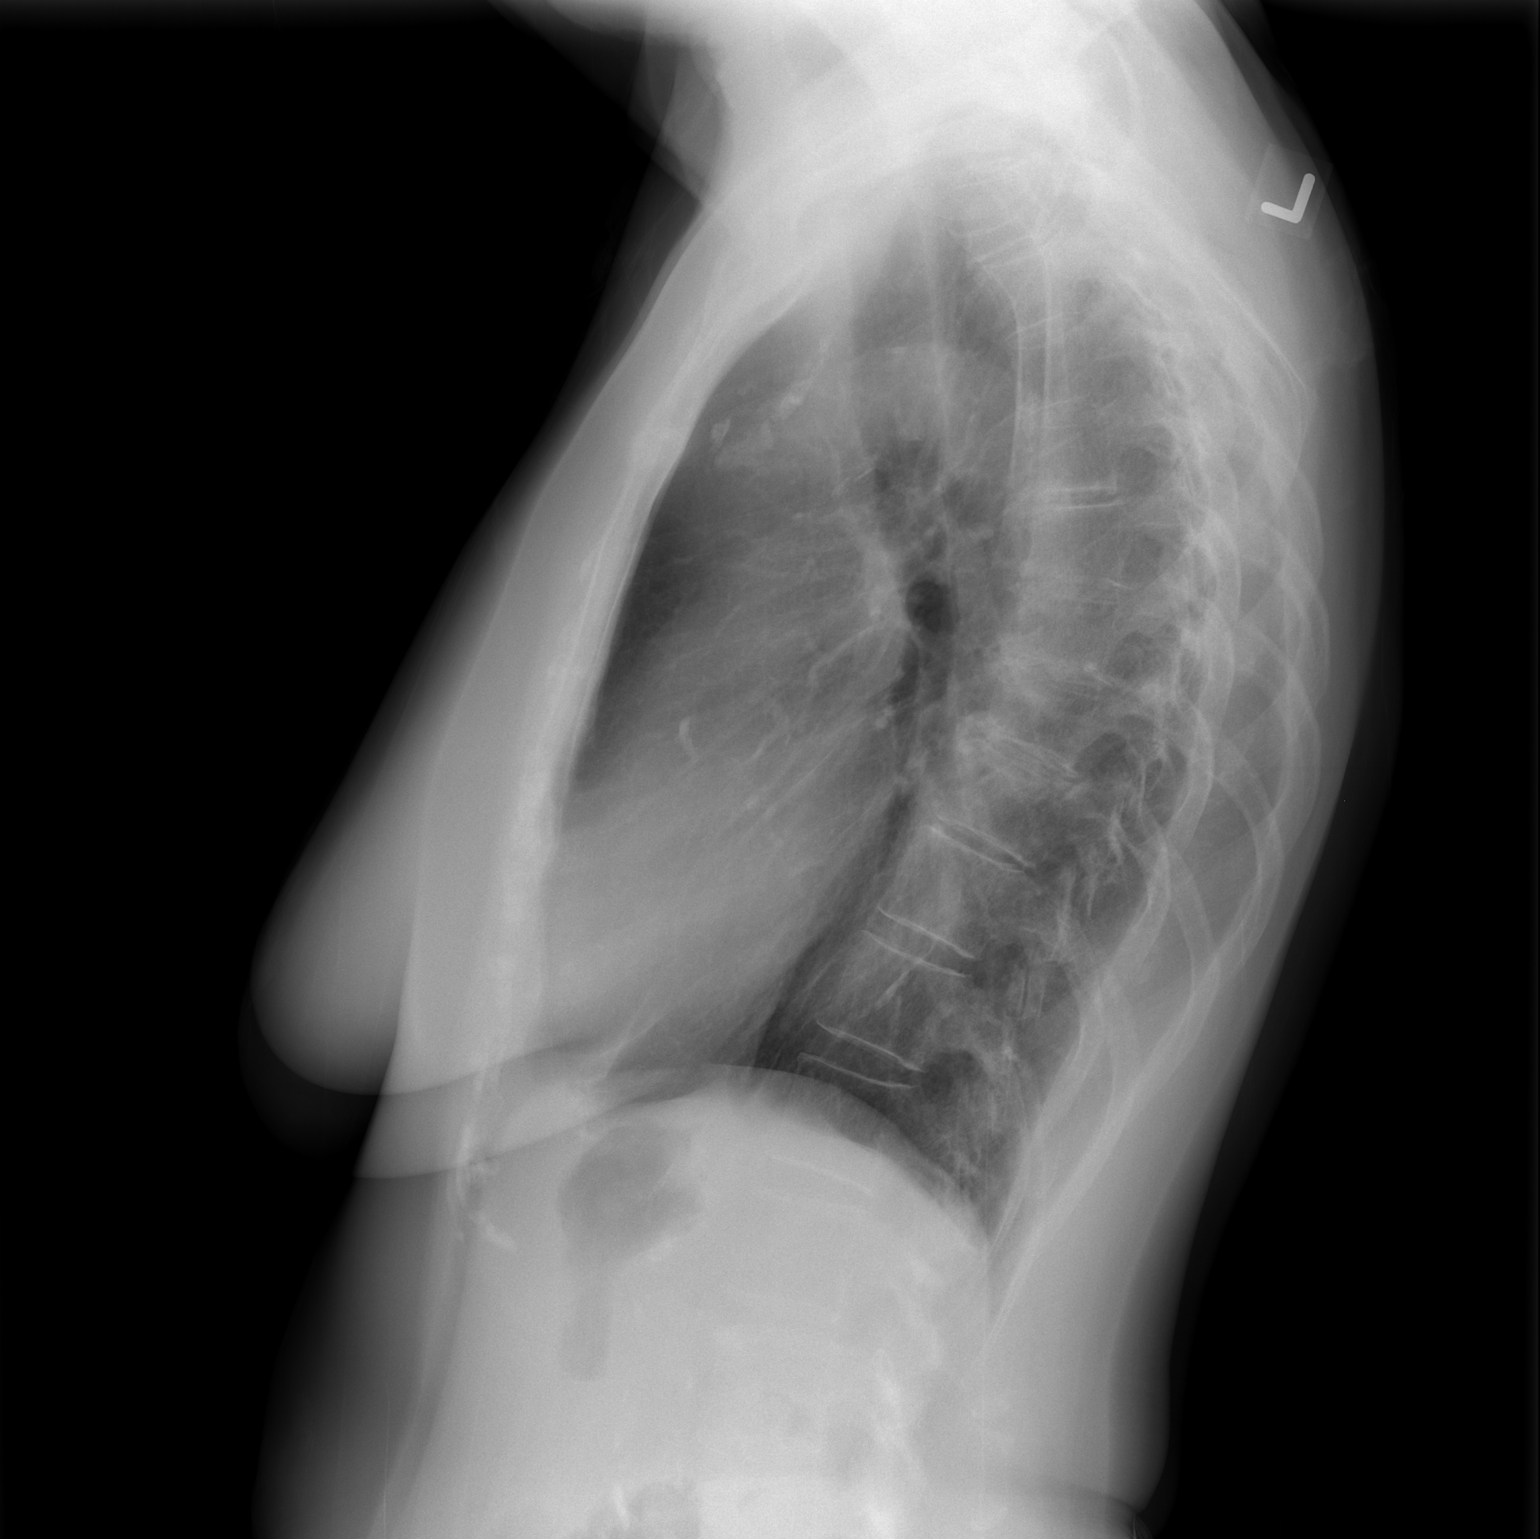

[2 of 2 positions shown; findings below may reference images not displayed]

FINDINGS: The cardiac size is within normal limits. There is hyperaeration of the lungs.  No active pulmonary process.  Symmetrical normal sized pulmonary hila.  Degenerative changes in the thoracic spine, mainly at T8-9 and T9-10.  Mild thoracic scoliosis.
IMPRESSION: Pulmonary hyperaeration raises suspicion for COPD.  No acute chest findings.

## 2008-01-19 HISTORY — PX: NM MYOCAR PERF WALL MOTION: HXRAD629

## 2008-08-30 ENCOUNTER — Encounter: Admission: RE | Admit: 2008-08-30 | Discharge: 2008-08-30 | Payer: Self-pay | Admitting: Family Medicine

## 2008-08-30 IMAGING — CR DG FOOT COMPLETE 3+V*L*
3 series · 3 of 3 positions shown · non-contrast
Comparison: [HOSPITAL] at [HOSPITAL] left ankle
radiographs [DATE].

CLINICAL DATA: Left great toe and foot pain for a week.  Possible
gout.

LEFT FOOT - COMPLETE 3+ VIEW

[view not recorded (1 of 3)]
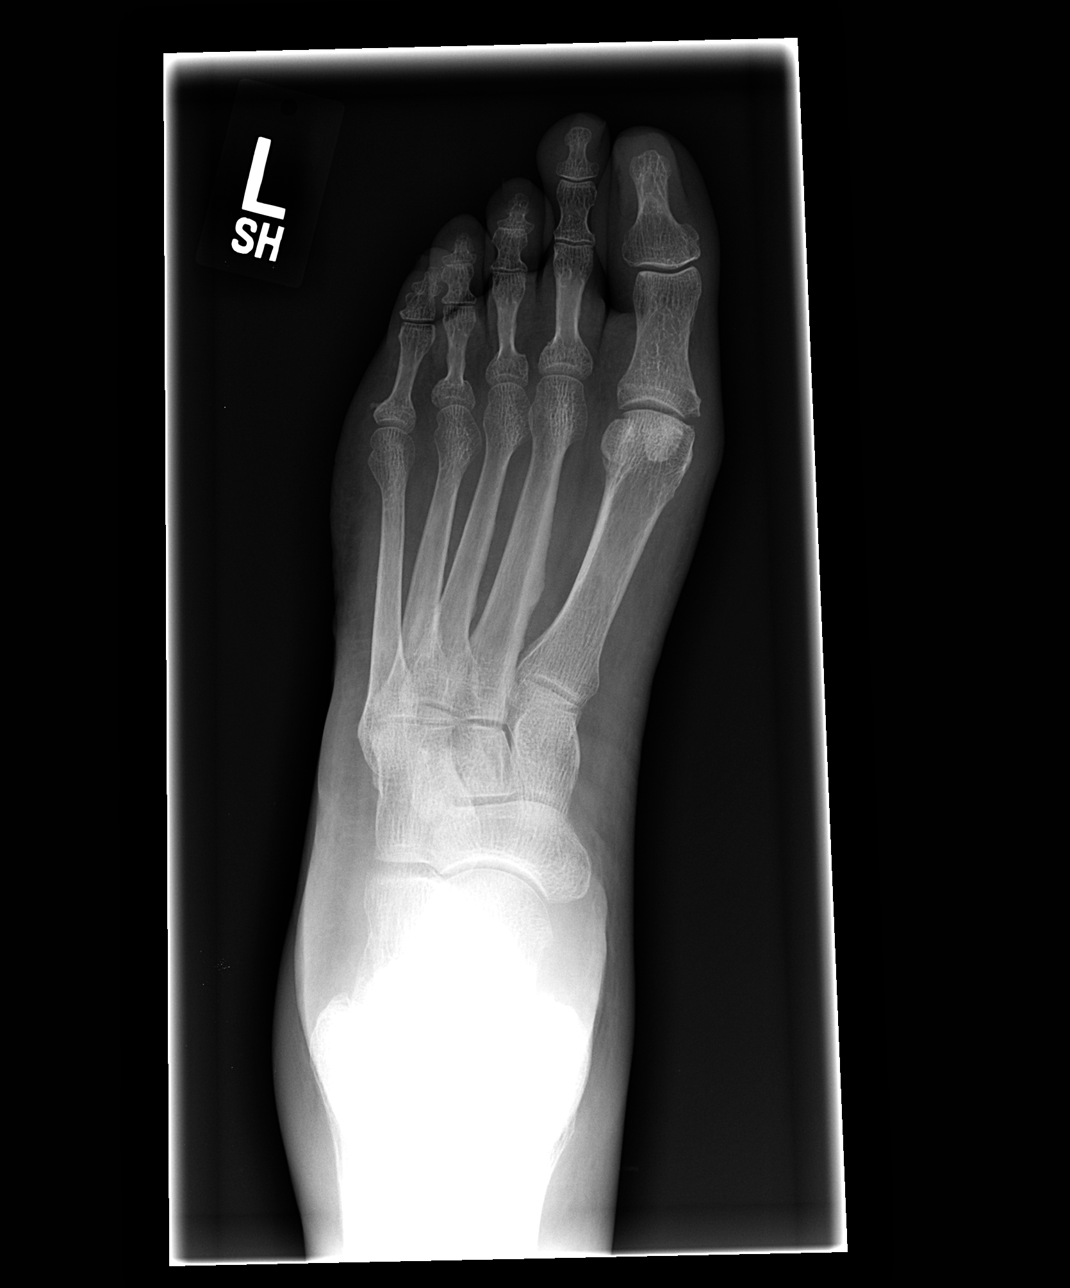

[view not recorded (2 of 3)]
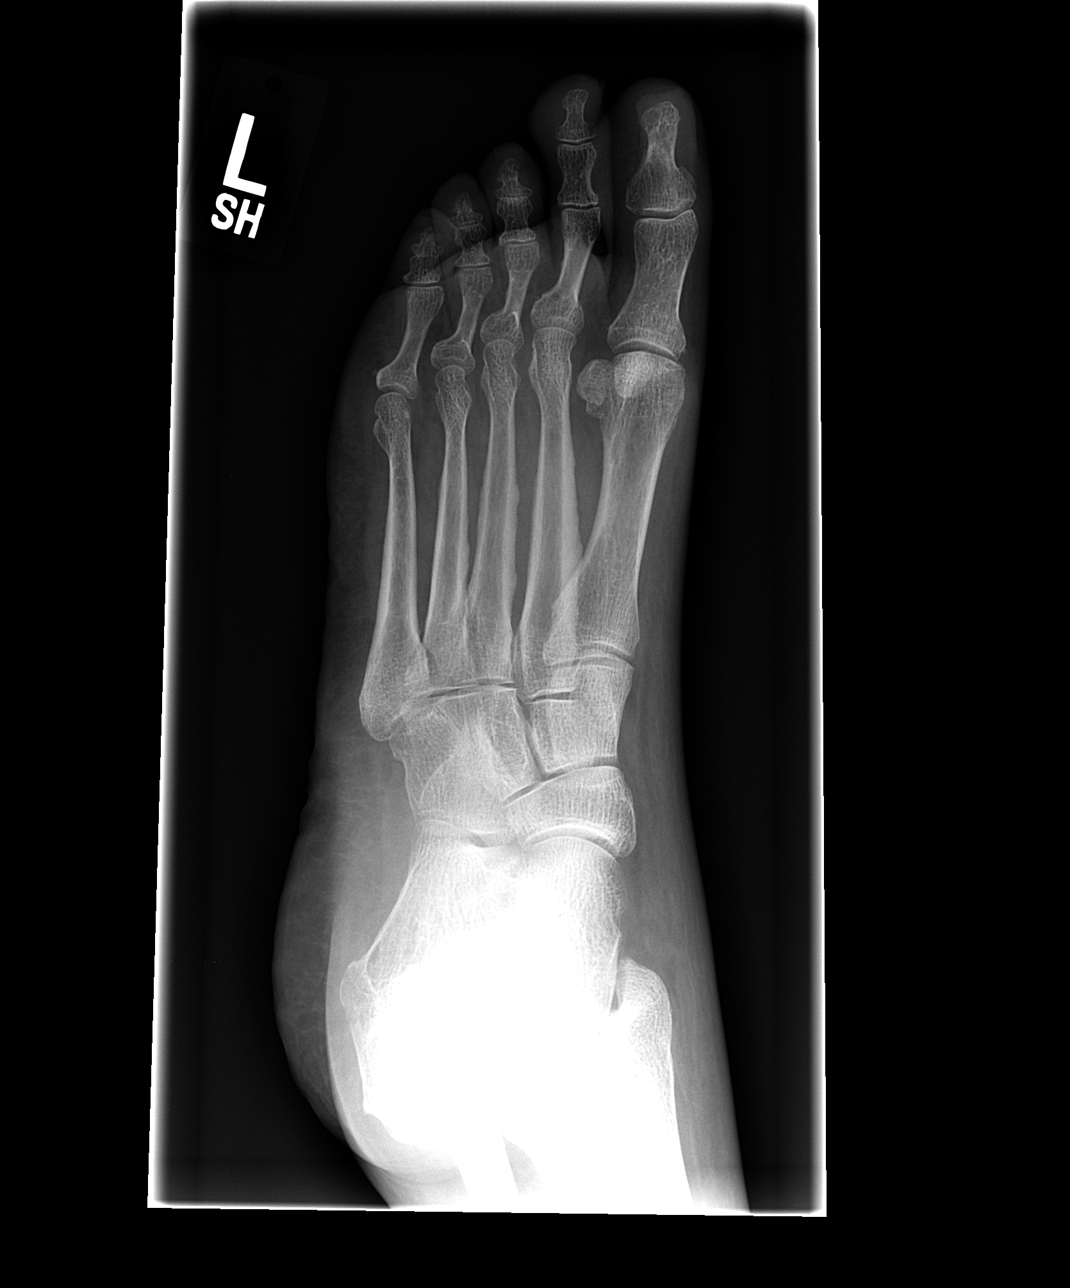

[view not recorded (3 of 3)]
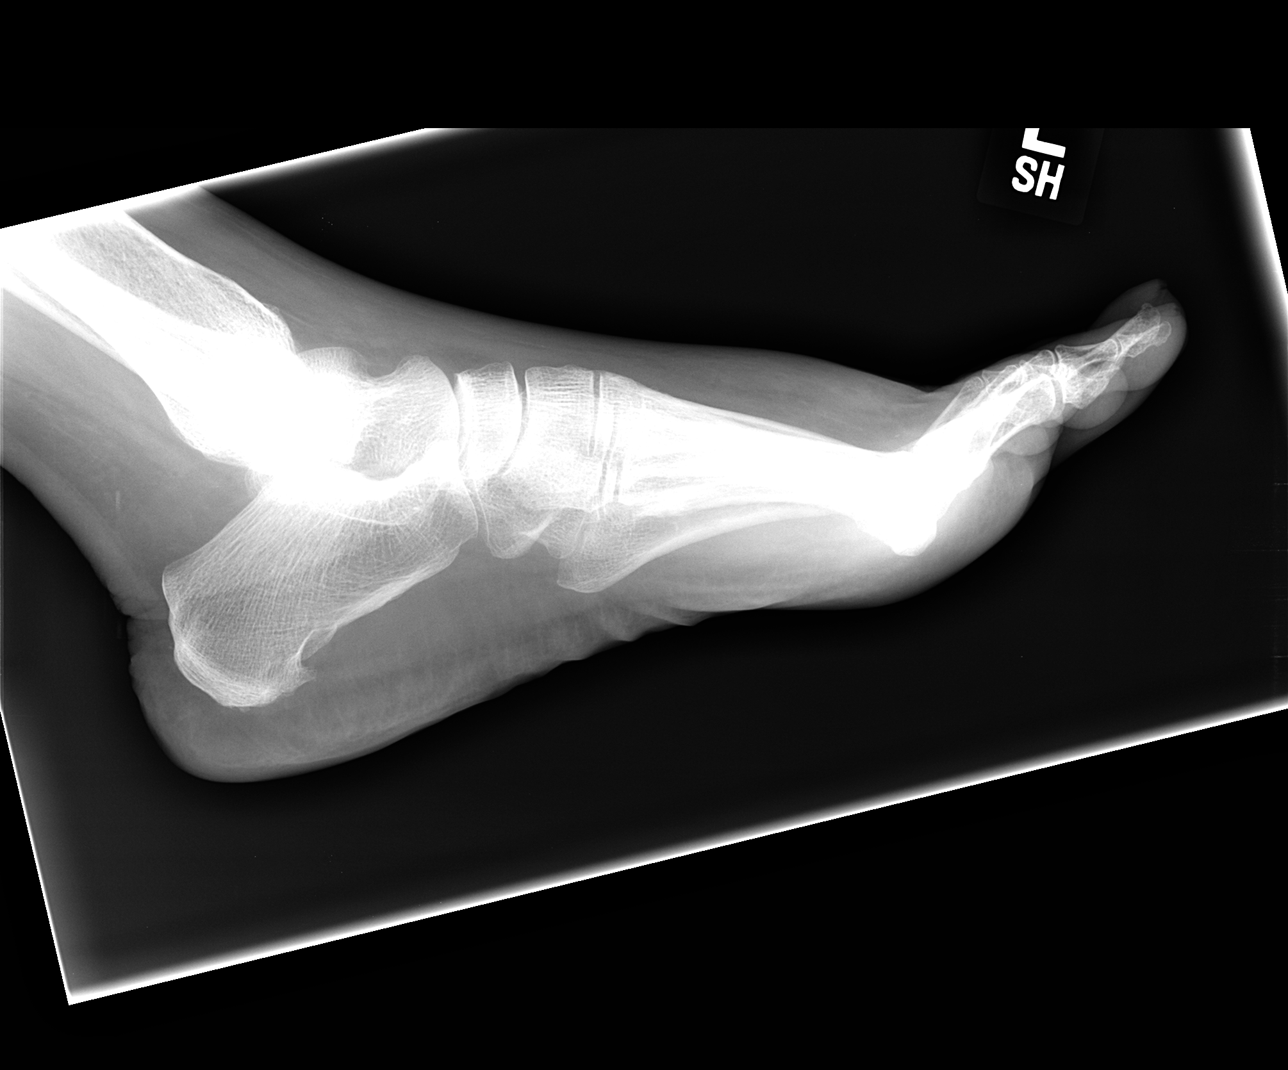

[3 of 3 positions shown; findings below may reference images not displayed]

FINDINGS: 3 mm subchondral cyst is seen at the medial base proximal
phalanx left great toe.  No other gouty arthritic changes are seen.
3 mm inferior degenerative calcaneal spurs seen.  No other
significant osseous, articular, or soft tissue abnormality noted.
IMPRESSION: 1.  3 mm solitary subchondral cyst medial base proximal phalanx
left great toe.  Degenerative etiology favored, subtle gout
possible.
2.  3 mm degenerative inferior calcaneal spur.
3.  Otherwise, negative.

## 2008-08-30 IMAGING — CR DG ANKLE COMPLETE 3+V*L*
3 series · 3 of 3 positions shown · non-contrast
Comparison: [HOSPITAL] at [HOSPITAL] the left foot
radiographs [DATE].

CLINICAL DATA: Gout with left foot pain.

LEFT ANKLE COMPLETE - 3+ VIEW

[view not recorded (1 of 3)]
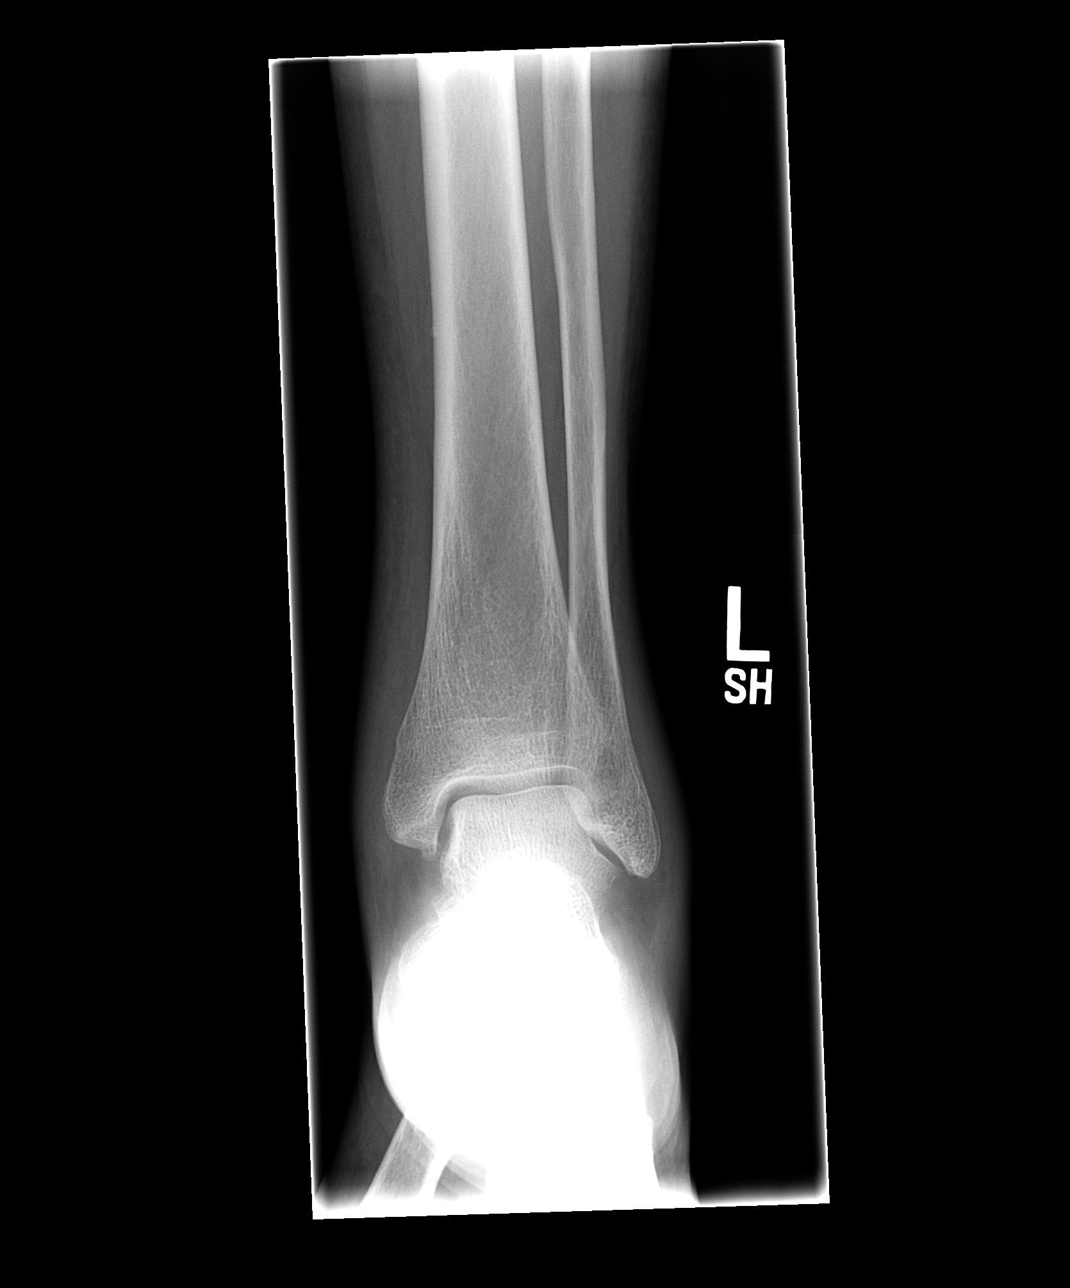

[view not recorded (2 of 3)]
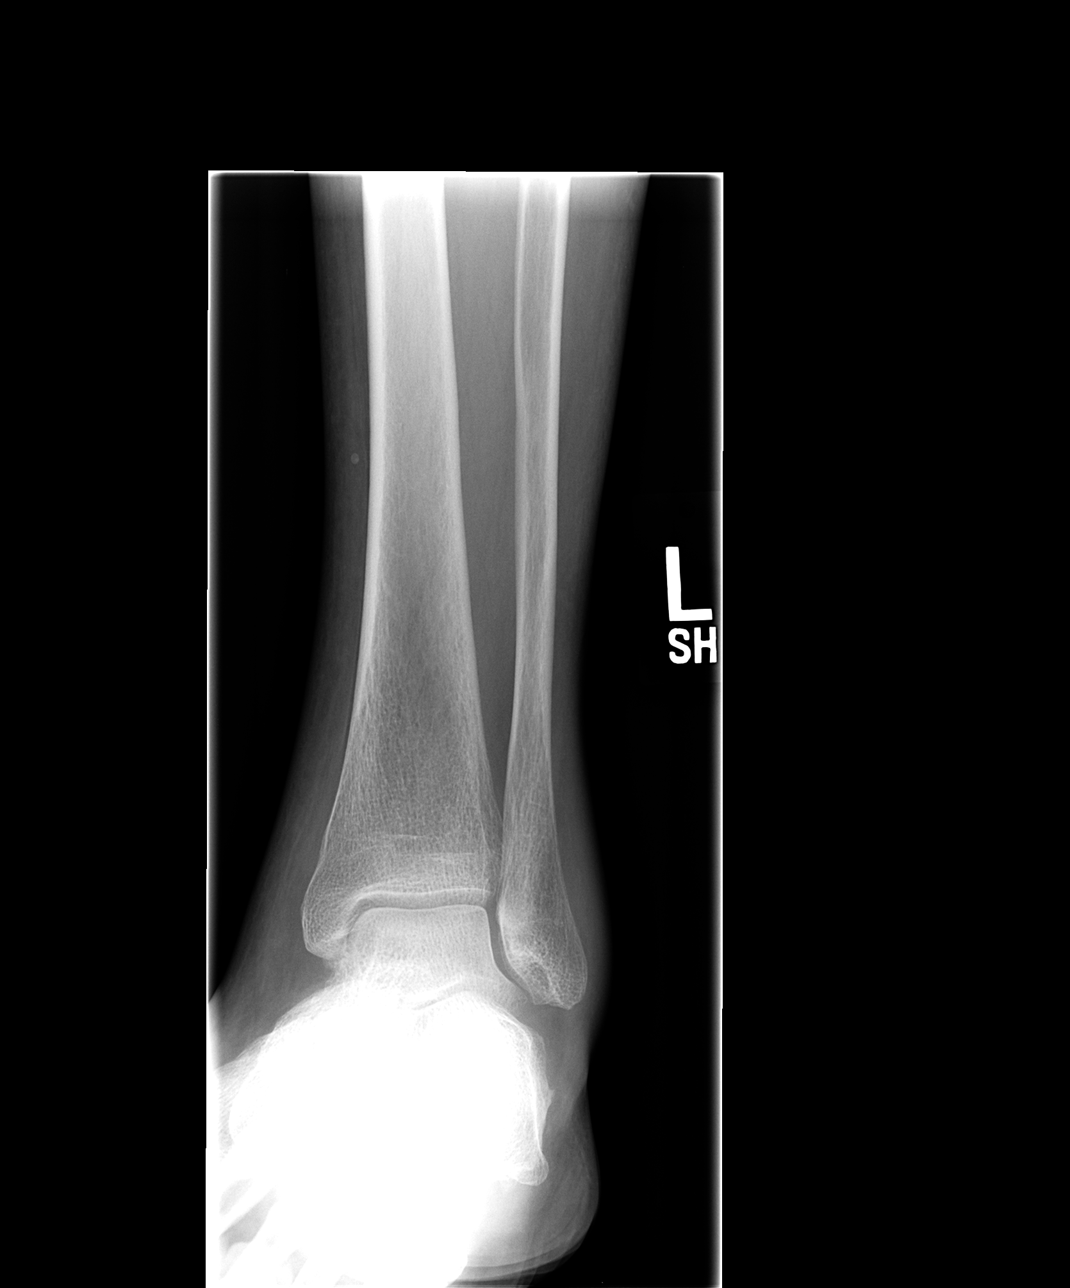

[view not recorded (3 of 3)]
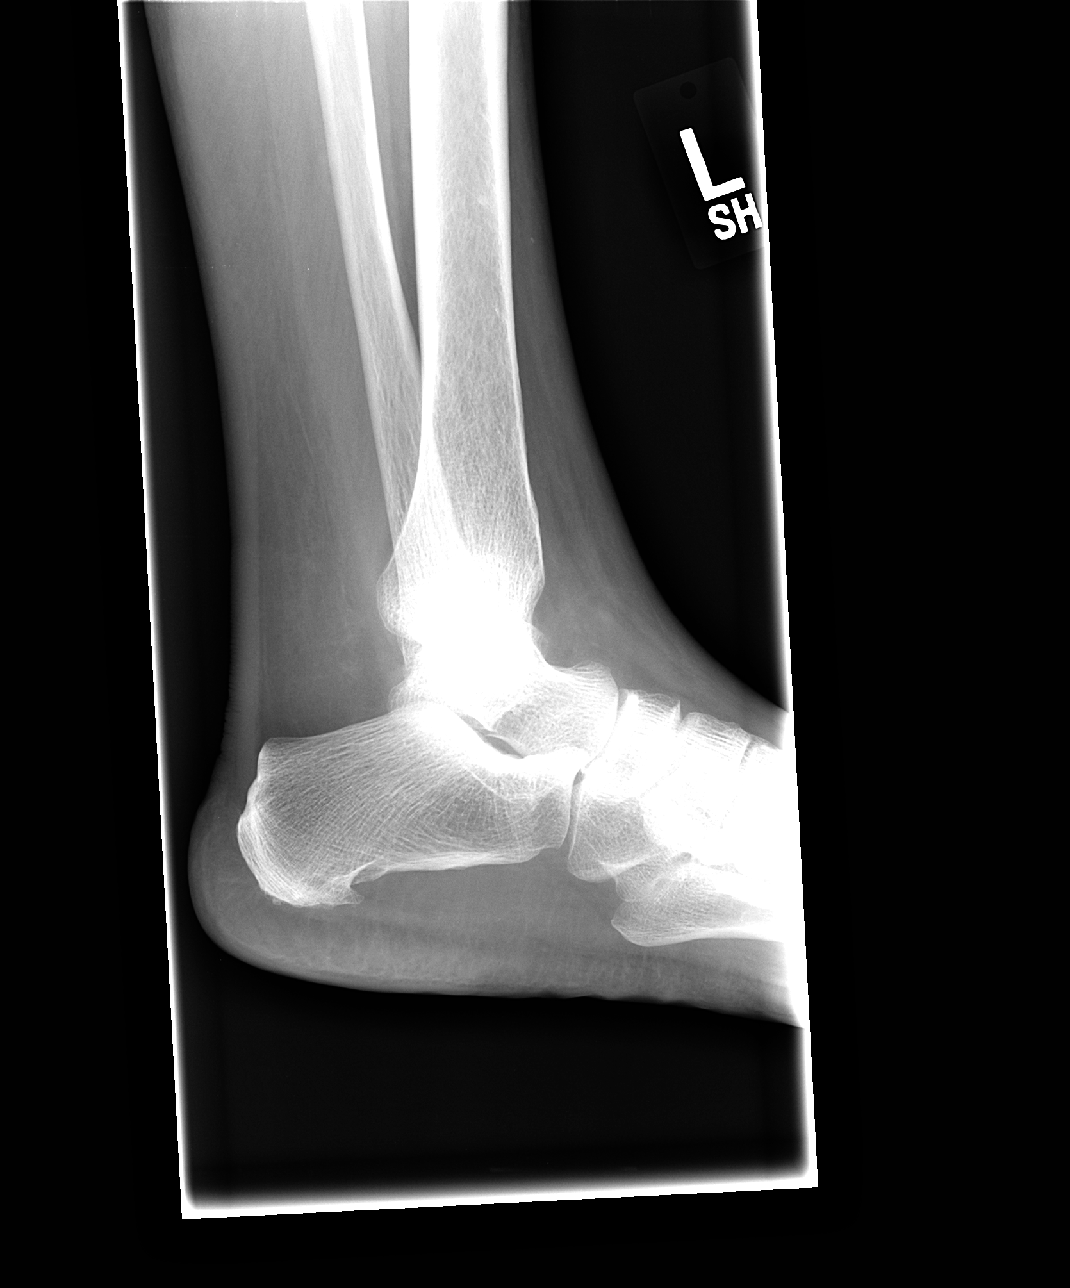

[3 of 3 positions shown; findings below may reference images not displayed]

FINDINGS: Slight nonspecific diffuse left ankle soft tissue
swelling is seen.  Ankle mortise is symmetric.  Small 4 mm medial
malleolar inferior osteophyte and inferior calcaneal spurs seen.
No other significant osseous, articular or soft tissue
abnormalities seen.  Specifically no evidence for gouty arthritis
visualized.
IMPRESSION: 1.  Slight nonspecific diffuse left ankle soft tissue swelling.
2.  Small degenerative osteophytes of the medial malleolus and
inferior calcaneus.
3.  No other acute or gouty arthritic findings.

## 2009-02-06 ENCOUNTER — Encounter: Admission: RE | Admit: 2009-02-06 | Discharge: 2009-02-06 | Payer: Self-pay | Admitting: Family Medicine

## 2009-02-06 IMAGING — CR DG LUMBAR SPINE COMPLETE 4+V
5 series · 5 of 5 positions shown · non-contrast
Comparison: 

CLINICAL DATA: LUMBAR SPINE - COMPLETE 4+ VIEW

[view not recorded (1 of 5)]
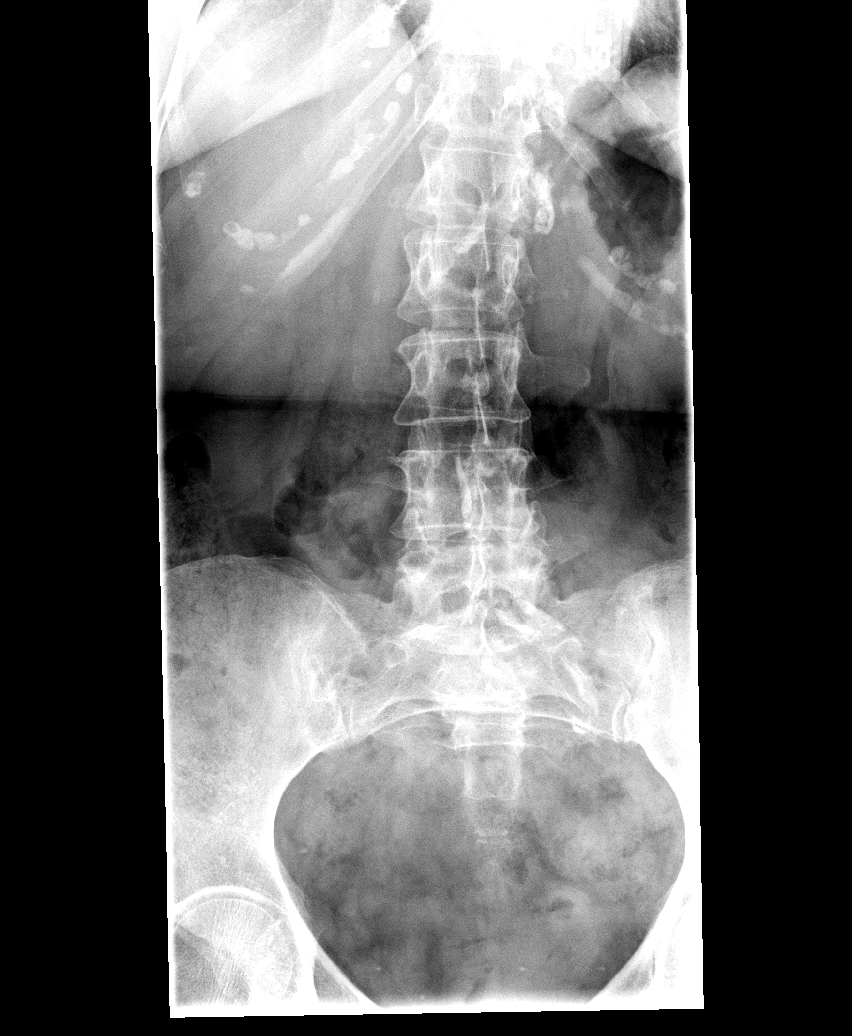

[view not recorded (2 of 5)]
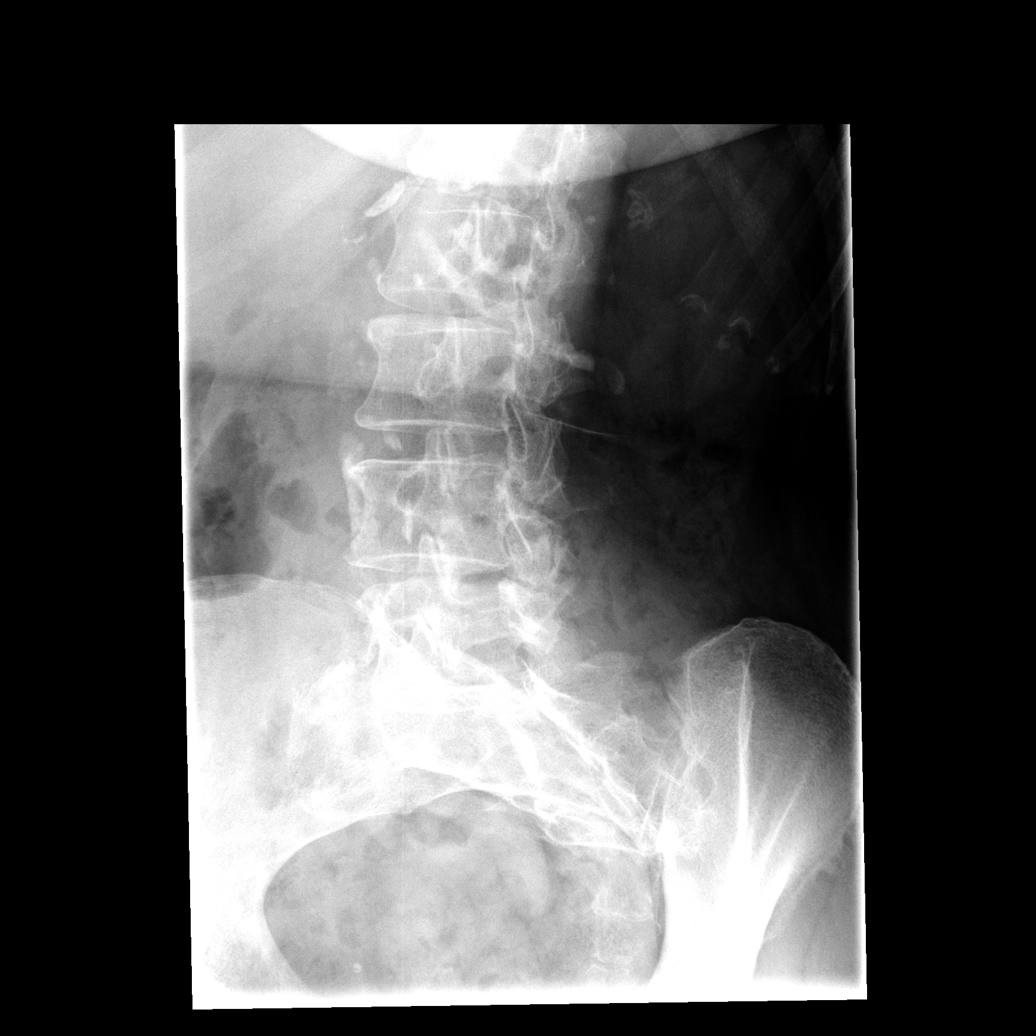

[view not recorded (3 of 5)]
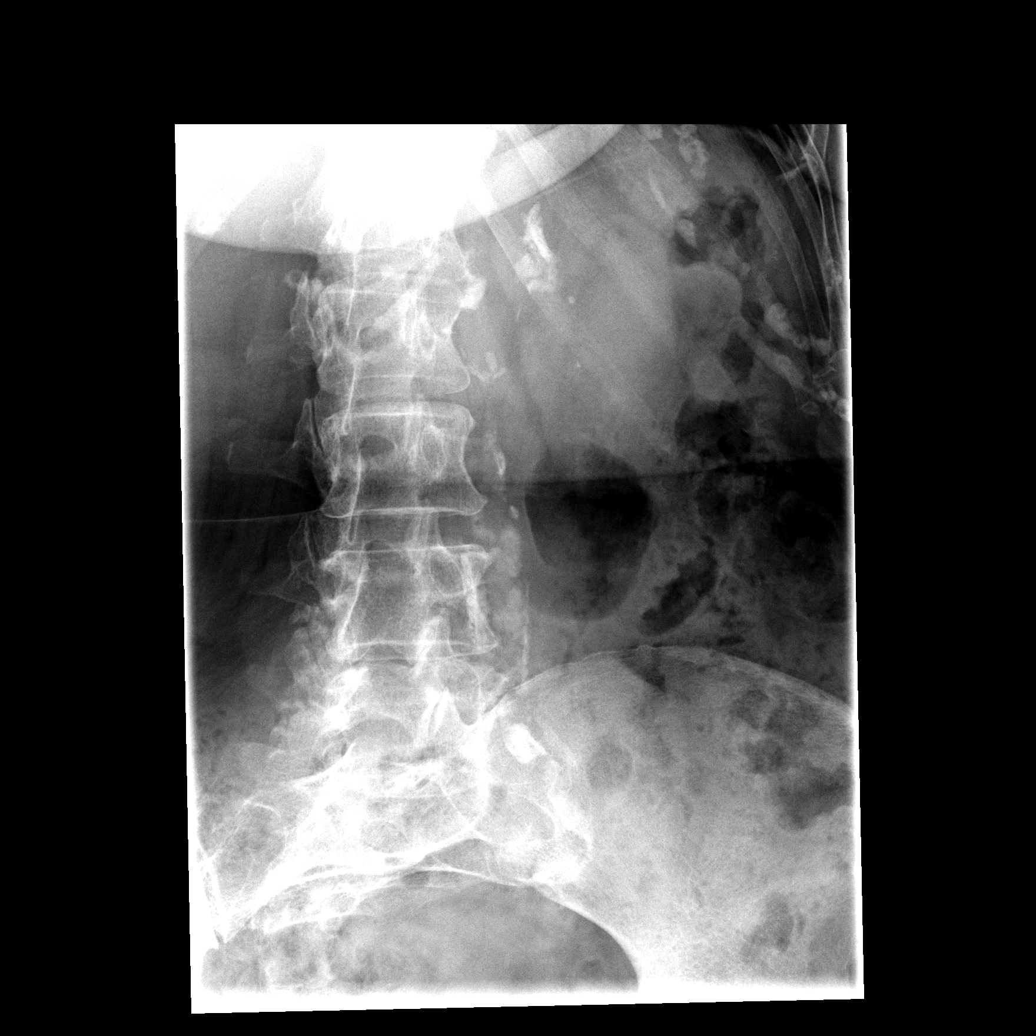

[view not recorded (4 of 5)]
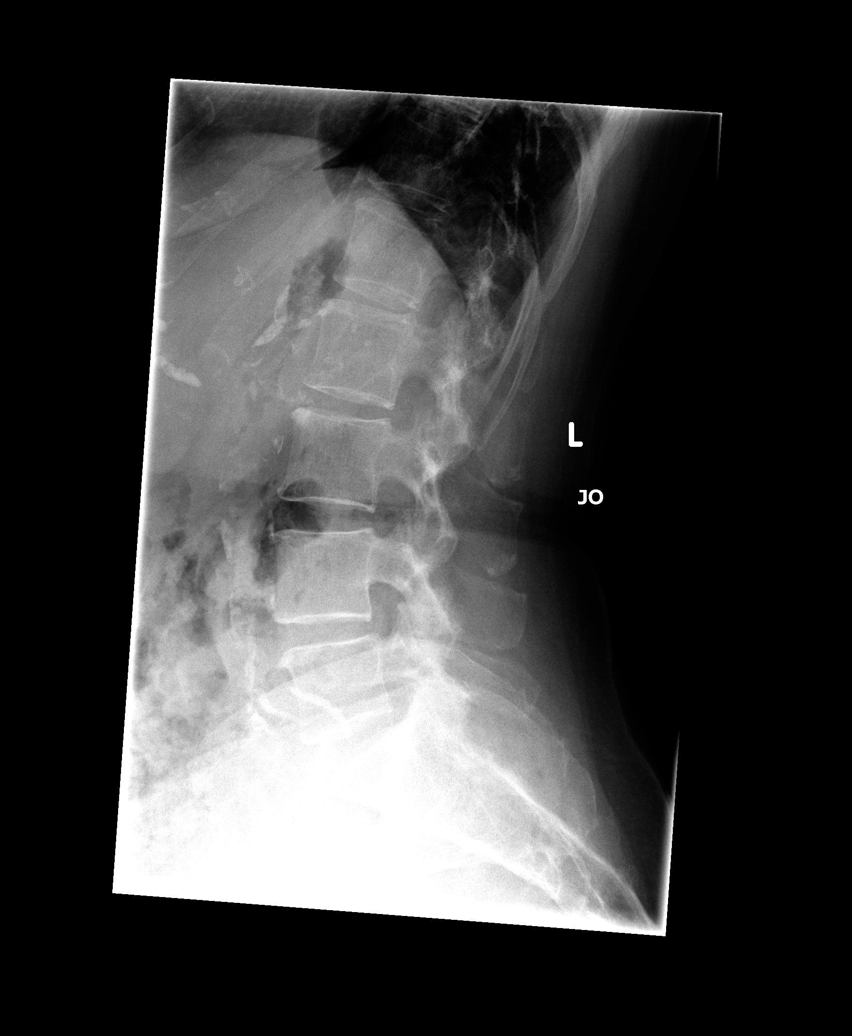

[view not recorded (5 of 5)]
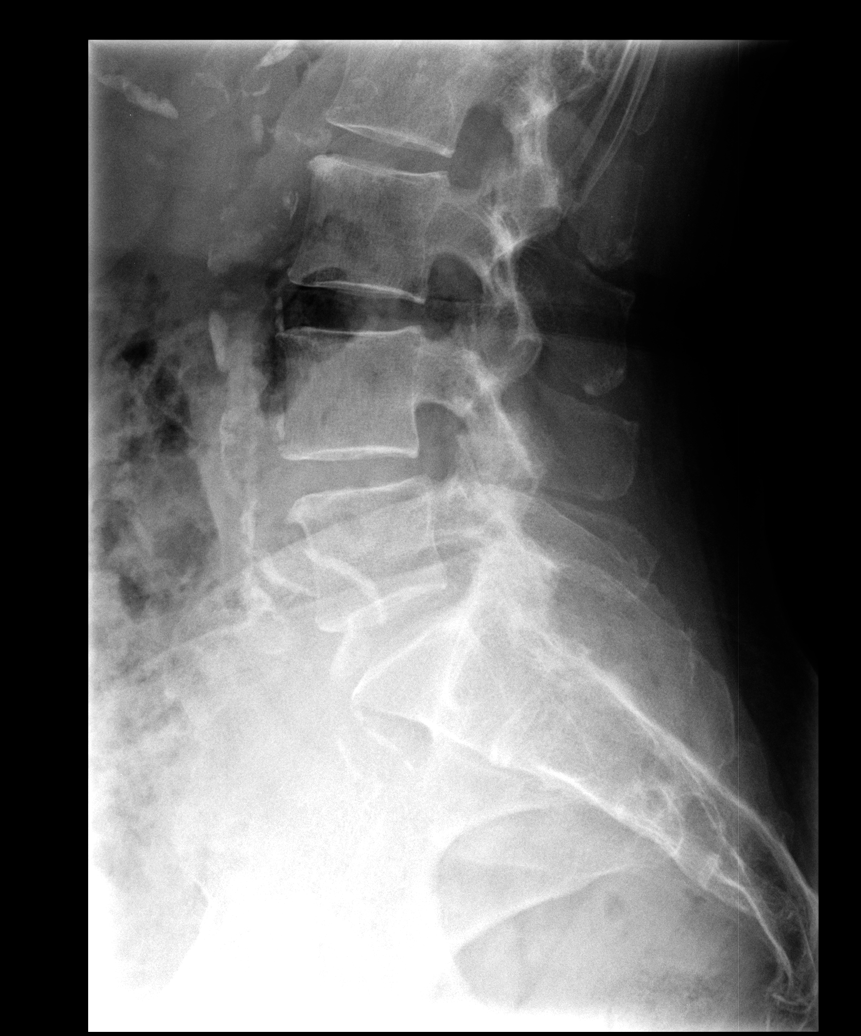

[5 of 5 positions shown; findings below may reference images not displayed]

FINDINGS: Facet arthropathy at L4-5 and L5-S1. No disc space
narrowing.  No pars defects.  No spondylolisthesis.  Aortic
calcification.  SI joints unremarkable
IMPRESSION: Lower lumbar facet arthropathy.

## 2010-10-05 NOTE — Op Note (Signed)
Mia [name], Mia [name]                          ACCOUNT NO.:  [id number]   MEDICAL RECORD NO.:  [id number]                   PATIENT TYPE:  AMB   LOCATION:  ENDO                                 FACILITY:  [facility]   PHYSICIAN:  Nelwyn Salisbury, M.D.               DATE OF BIRTH:  1926/02/08   DATE OF PROCEDURE:  12/07/2003  DATE OF DISCHARGE:                                 OPERATIVE REPORT   PROCEDURE PERFORMED:  Screening colonoscopy.   ENDOSCOPIST:  Nelwyn Salisbury, M.D.   INSTRUMENT USED:  Olympus video colonoscope.   INDICATION FOR PROCEDURE:  A 77 year old African-American female undergoing  screening colonoscopy.  The patient has a previous history of a tubular  adenoma removed from the colon and diverticulosis diagnosed in the sigmoid  colon.  Rule out recurrent polyps.   PREPROCEDURE PREPARATION:  Informed consent was procured from the patient.  The patient was fasted for eight hours prior to the procedure and prepped  with a bottle of magnesium citrate and a gallon of GoLYTELY the night prior  to the procedure.   PREPROCEDURE PHYSICAL:  VITAL SIGNS:  The patient had stable vital signs.  NECK:  Supple.  CHEST:  Clear to auscultation.  S1, S2 regular.  ABDOMEN:  Soft with normal bowel sounds.   DESCRIPTION OF PROCEDURE:  The patient was placed in the left lateral  decubitus position and sedated with 60 mg of Demerol and 6 mg of Versed  intravenously.  Once the patient was adequately sedate and maintained on low-  flow oxygen and continuous cardiac monitoring, the Olympus video colonoscope  was advanced from the rectum to the cecum.  There was evidence of sigmoid  diverticulosis.  No masses or polyps were identified.  The appendiceal  orifice and the ileocecal valve were clearly visualized and photographed.  The IC valve seemed somewhat lipomatous because of its yellowish coloration.  Retroflexion in the rectum revealed no abnormalities.  The mucosa was  clearly examined on  withdrawal of the scope.  The patient tolerated the  procedure well without immediate complications.   IMPRESSION:  1. Sigmoid diverticulosis.  2. No masses or polyps seen.   RECOMMENDATIONS:  1. Repeat colonoscopy in the next five years unless the patient develops any     abnormal symptoms in the interim.  2. Continue a high-fiber diet with liberal fluid intake to prevent worsening     of diverticular disease.  3. Outpatient follow-up as the need arises in the future.                                               Nelwyn Salisbury, M.D.    JNM/MEDQ  D:  12/07/2003  T:  12/07/2003  Job:  [id number]   cc:   Myra Rude  Criss Rosales, M.D.  [phone] [address], [address]  [place]  [name] [address]  Fax: [phone]

## 2010-10-05 NOTE — Procedures (Signed)
[place]  Patient:    Mia [name], Mia [name]                       MRN: [id number] Proc. Date: 04/17/00 Adm. Date:  [id number] Attending:  Juanita Craver CC:         Elyn Peers, M.D.   Procedure Report  DATE OF BIRTH:  1925-12-06  PROCEDURE PERFORMED:  Esophagogastroduodenoscopy.  ENDOSCOPIST:  Nelwyn Salisbury, M.D.  INSTRUMENT USED:  Olympus video panendoscope.  INDICATIONS:  Epigastric pain in a 77 year old African-American female. Rule out peptic ulcer disease, esophagitis, gastritis, etc.  PREPROCEDURE PREPARATION:  Informed consent was procured from the patient. The patient was fasted for 8 hours prior to the procedure.  PREPROCEDURE PHYSICAL:  Patient has stable vital signs.  NECK:  Supple.  CHEST:  Clear to auscultation. S1, S2 regular.  ABDOMEN:  Soft with normal abdominal bowel sounds.  DESCRIPTION OF PROCEDURE:  The patient was placed in the left lateral decubitus position and sedated with 40 mcg of fentanyl and 4 mg of Versed intravenously.  Once the patient was adequately sedated and maintained on low-flow oxygen and continuous cardiac monitoring, the Olympus video panendoscope was advanced through the mouth piece, over the tongue into the esophagus under direct vision.  The entire esophagus appeared normal without evidence of rings, strictures, masses, lesions or esophagitis.  The scope was then advanced into the stomach.   A small hiatal hernia was seen on high retroflexion.  There was mild antral gastritis.  No erosions, ulcerations were seen.  The proximal small bowel also appeared normal.  IMPRESSION:  Essentially normal esophagogastroduodenoscopy except for mild antral gastritis and small hiatal hernia.  RECOMMENDATIONS: 1. Avoid nonsteroidals and follow antireflux measures. 2. Proceed with colonoscopy at this time. DD:  04/17/00 TD:  04/17/00 Job: [id number]

## 2012-09-10 ENCOUNTER — Ambulatory Visit: Payer: Self-pay | Admitting: *Deleted

## 2012-11-12 ENCOUNTER — Ambulatory Visit
Admission: RE | Admit: 2012-11-12 | Discharge: 2012-11-12 | Disposition: A | Discharge status: Home / Self Care | Payer: 59 | Source: Ambulatory Visit | Attending: Family Medicine | Admitting: Family Medicine

## 2012-11-12 ENCOUNTER — Other Ambulatory Visit: Payer: Self-pay | Admitting: Family Medicine

## 2012-11-12 DIAGNOSIS — R52 Pain, unspecified: Secondary | ICD-10-CM

## 2012-11-12 IMAGING — CR DG FOOT COMPLETE 3+V*L*
3 series · 3 of 3 positions shown · non-contrast
Comparison: [DATE]

CLINICAL DATA: Left foot pain.

LEFT FOOT - COMPLETE 3+ VIEW

[view not recorded (1 of 3)]
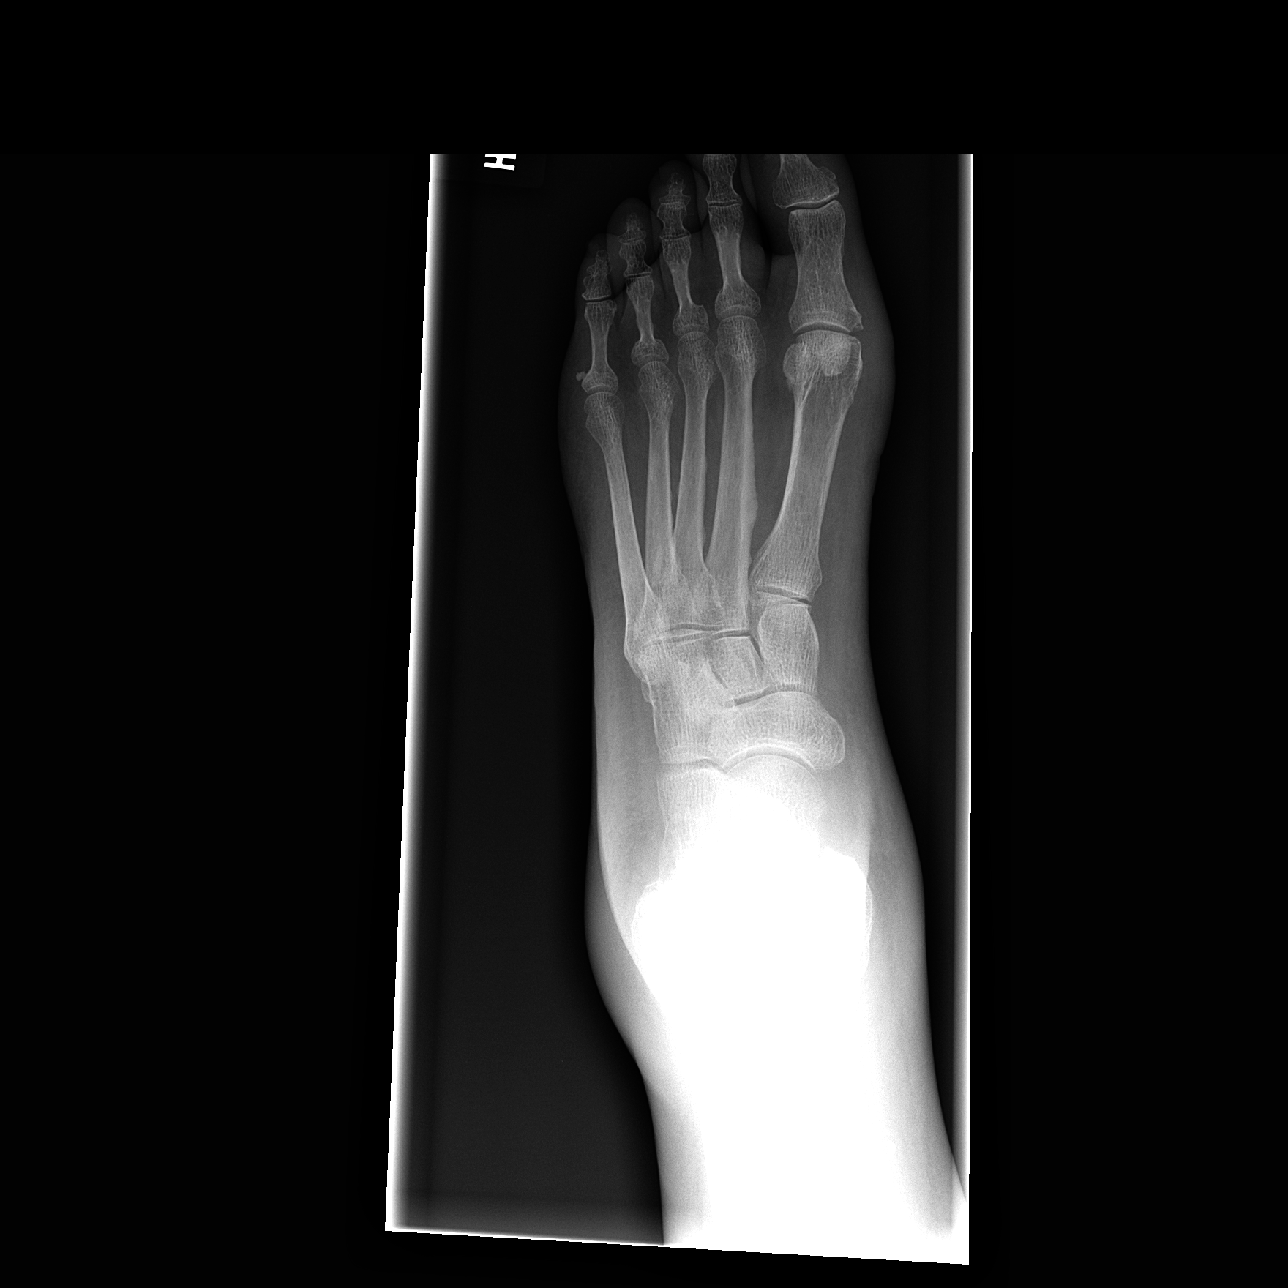

[view not recorded (2 of 3)]
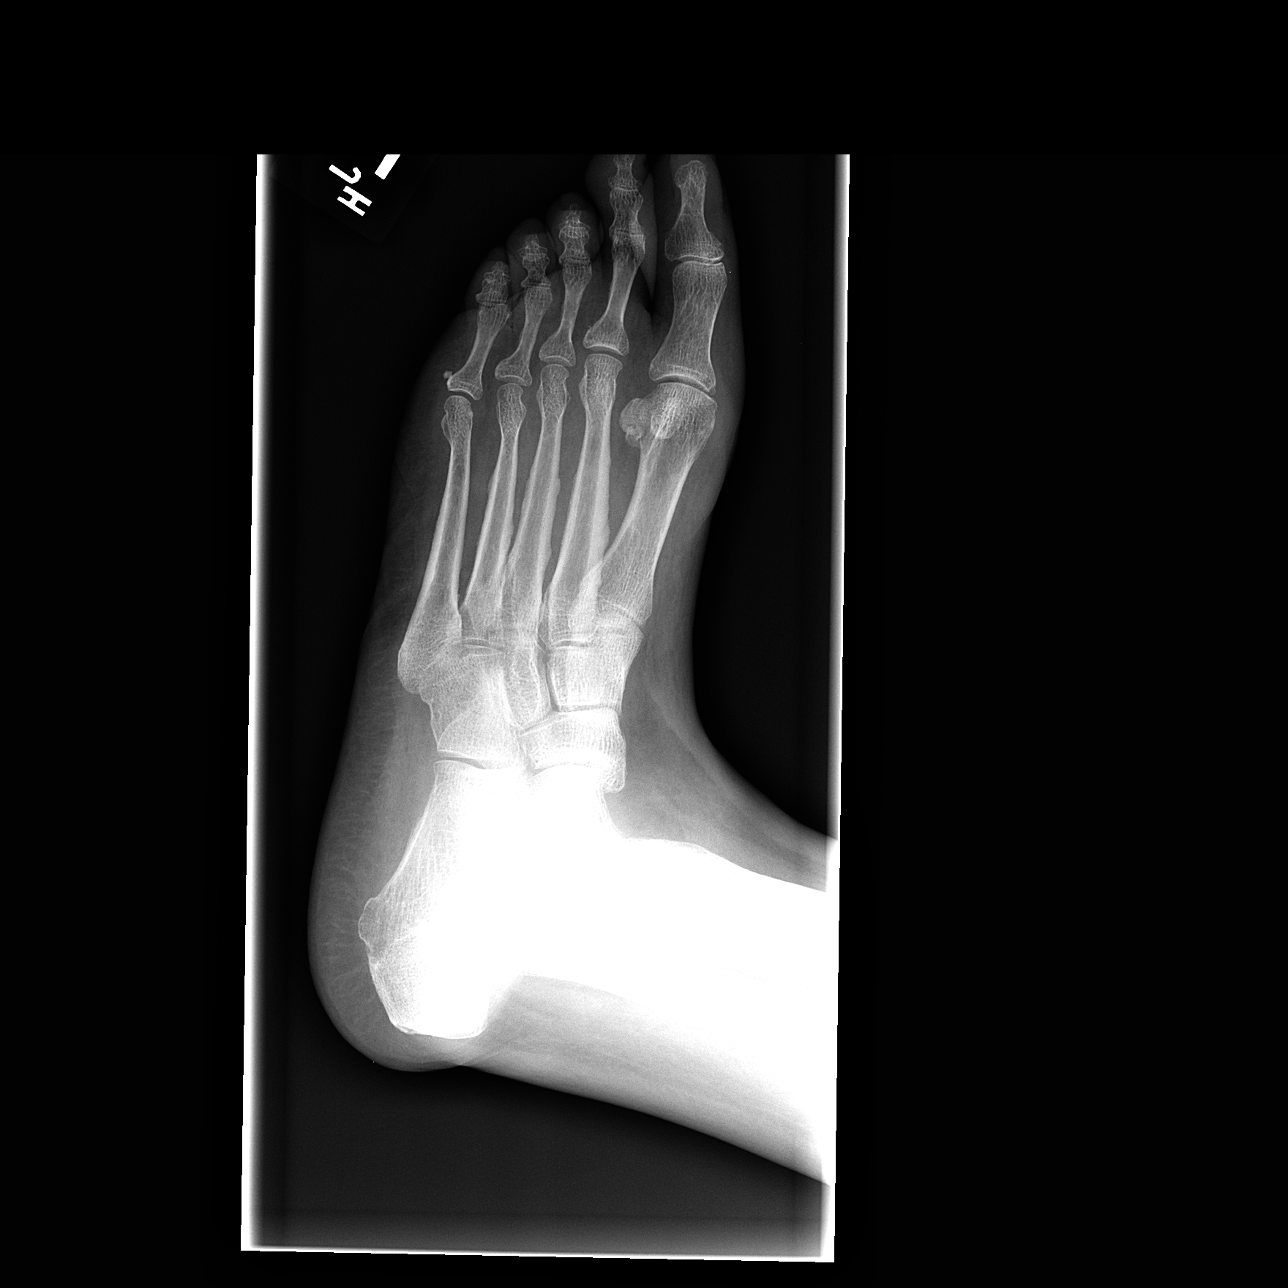

[view not recorded (3 of 3)]
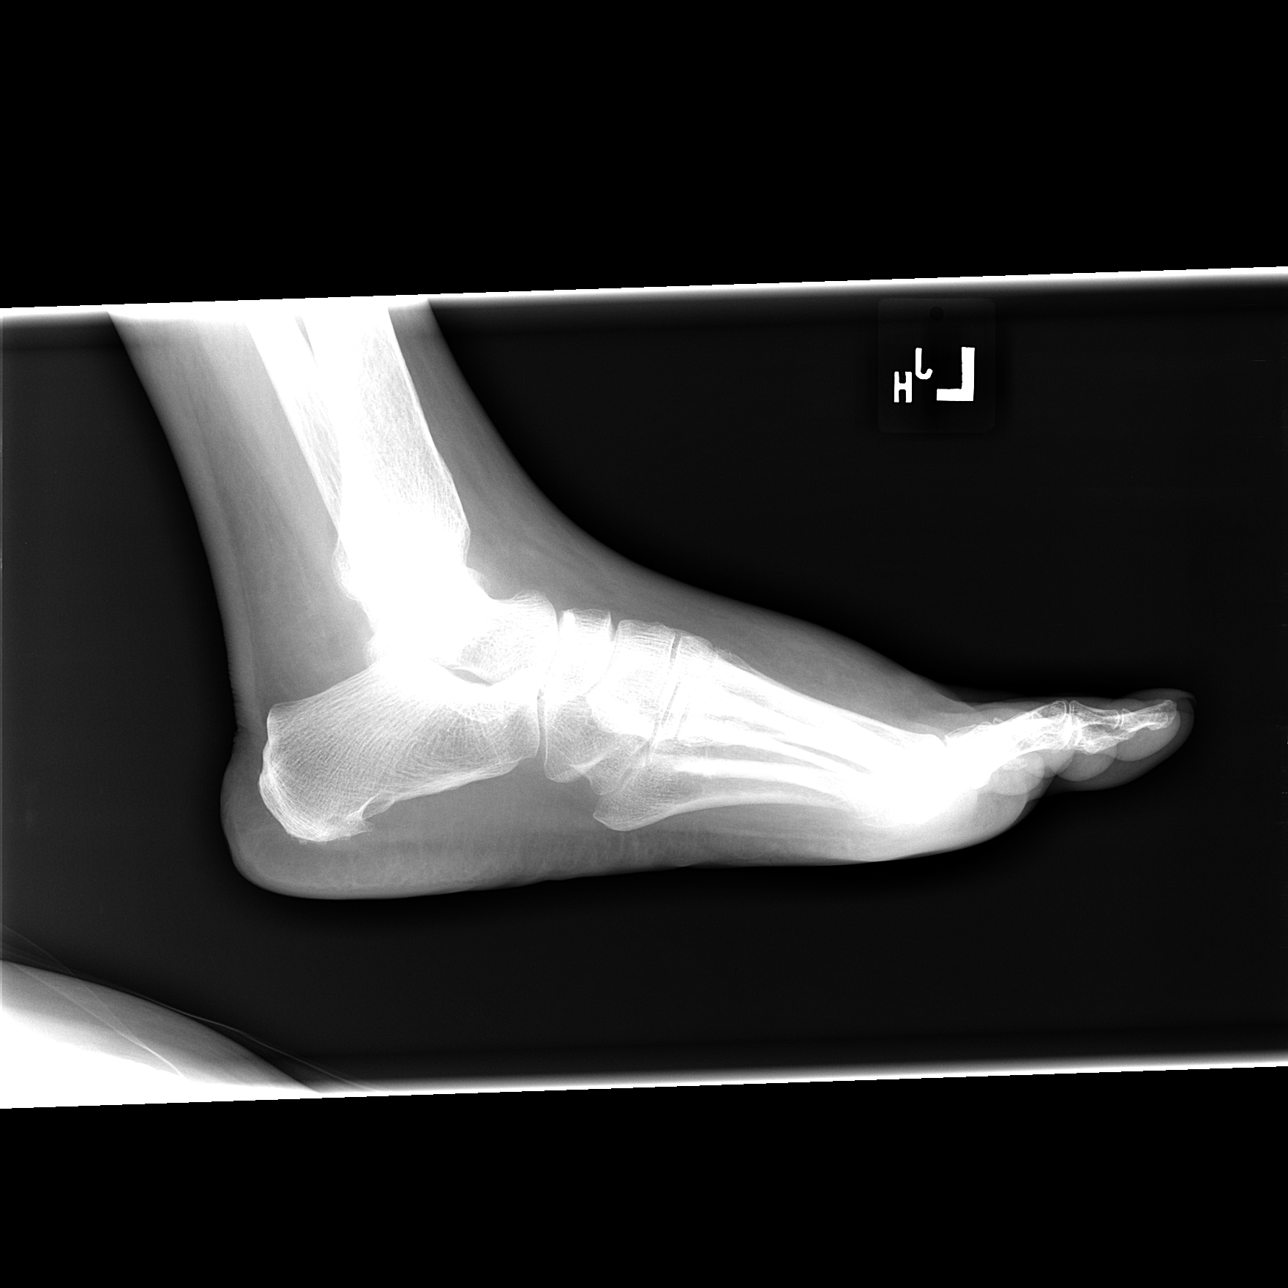

[3 of 3 positions shown; findings below may reference images not displayed]

FINDINGS: The joint spaces are maintained.  No acute fractures
identified.  Mild irregular cortical thickening of the second third
and fourth metatarsals is likely stress-related and appears stable.
IMPRESSION: No acute bony findings.

## 2013-07-02 ENCOUNTER — Other Ambulatory Visit: Payer: Self-pay | Admitting: Internal Medicine

## 2013-07-06 ENCOUNTER — Ambulatory Visit: Payer: 59 | Admitting: Internal Medicine

## 2013-07-11 ENCOUNTER — Other Ambulatory Visit: Payer: Self-pay | Admitting: Internal Medicine

## 2013-07-12 NOTE — Telephone Encounter (Signed)
Forward to med record Need paper chart

## 2013-07-12 NOTE — Telephone Encounter (Signed)
E sentrx 

## 2013-07-30 ENCOUNTER — Encounter: Payer: Self-pay | Admitting: *Deleted

## 2013-08-05 ENCOUNTER — Encounter: Payer: Self-pay | Admitting: Internal Medicine

## 2013-08-05 ENCOUNTER — Ambulatory Visit (INDEPENDENT_AMBULATORY_CARE_PROVIDER_SITE_OTHER): Payer: 59 | Admitting: Internal Medicine

## 2013-08-05 VITALS — BP 122/70 | HR 95 | Ht 69.0 in | Wt 132.1 lb

## 2013-08-05 DIAGNOSIS — R0600 Dyspnea, unspecified: Secondary | ICD-10-CM

## 2013-08-05 DIAGNOSIS — I10 Essential (primary) hypertension: Secondary | ICD-10-CM

## 2013-08-05 DIAGNOSIS — R0609 Other forms of dyspnea: Secondary | ICD-10-CM

## 2013-08-05 DIAGNOSIS — R0989 Other specified symptoms and signs involving the circulatory and respiratory systems: Secondary | ICD-10-CM

## 2013-08-05 DIAGNOSIS — I4949 Other premature depolarization: Secondary | ICD-10-CM

## 2013-08-05 DIAGNOSIS — R5381 Other malaise: Secondary | ICD-10-CM

## 2013-08-05 DIAGNOSIS — I493 Ventricular premature depolarization: Secondary | ICD-10-CM

## 2013-08-05 DIAGNOSIS — Z8673 Personal history of transient ischemic attack (TIA), and cerebral infarction without residual deficits: Secondary | ICD-10-CM

## 2013-08-05 DIAGNOSIS — R0602 Shortness of breath: Secondary | ICD-10-CM

## 2013-08-05 DIAGNOSIS — R5383 Other fatigue: Secondary | ICD-10-CM

## 2013-08-05 MED ORDER — AMLODIPINE BESYLATE 5 MG PO TABS: ORAL_TABLET | ORAL | Status: DC

## 2013-08-05 MED ORDER — CLOPIDOGREL BISULFATE 75 MG PO TABS: ORAL_TABLET | ORAL | Status: DC

## 2013-08-05 MED ORDER — METOPROLOL SUCCINATE ER 50 MG PO TB24: ORAL_TABLET | ORAL | Status: DC

## 2013-08-05 NOTE — Patient Instructions (Signed)
Your physician has requested that you have en exercise stress myoview. For further information please visit [email or web address]. Please follow instruction sheet, as given.  Your physician recommends that you schedule a follow-up appointment in: 2-4 weeks, after your stress test.

## 2013-08-06 ENCOUNTER — Encounter: Payer: Self-pay | Admitting: Internal Medicine

## 2013-08-06 DIAGNOSIS — R0609 Other forms of dyspnea: Secondary | ICD-10-CM

## 2013-08-06 DIAGNOSIS — Z8673 Personal history of transient ischemic attack (TIA), and cerebral infarction without residual deficits: Secondary | ICD-10-CM | POA: Insufficient documentation

## 2013-08-06 DIAGNOSIS — R0600 Dyspnea, unspecified: Secondary | ICD-10-CM | POA: Insufficient documentation

## 2013-08-06 DIAGNOSIS — I10 Essential (primary) hypertension: Secondary | ICD-10-CM | POA: Insufficient documentation

## 2013-08-06 NOTE — Progress Notes (Signed)
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 She has over the past year done very well, eats a pretty healthy diet and exercise without any difficulty. She continues to have some cold feet and hands but has had a negative rheumatologic workup with a negative ANA in the past. She does exercise about 3 times a week for 30 minutes. Recently she has noted worsening exercise tolerance and feels that she gets short of breath easier toward the end of exercise. In fact she cannot go nearly as far as she used to go. She denies any chest pain with this however she notes that it does take her longer to recover after exercise. Her last stress test was negative in 2009.  PMHx:  Past Medical History  Diagnosis Date  . Hypertension   . TIA (transient ischemic attack)   . Former smoker     Past Surgical History  Procedure Laterality Date  . Transthoracic echocardiogram  12/2005    mild DUST, EF=>55%; mild mitral annular calcif; mild-mod aortic root calcif  . Nm myocar perf wall motion  01/2008    lexiscan myoview - normal pattern of perfusion in all regions, EF 92%, no significant ischemia demonstated    FAMHx:  Family History  Problem Relation Age of Onset  . Lung cancer Brother     SOCHx:   reports that she quit smoking about 13 years ago. She does not have any smokeless tobacco history on file. She reports that she drinks about 3.5 ounces of alcohol per week. She reports that she does not use illicit drugs.  ALLERGIES:  No Known Allergies  ROS: A comprehensive review of systems was negative except for: Respiratory: positive for dyspnea on exertion and decreased exercise tolerance  HOME MEDS: Current Outpatient Prescriptions  Medication Sig Dispense Refill  . amLODipine (NORVASC) 5 MG tablet take 1 tablet by mouth once  daily  90 tablet  3  . Cholecalciferol (VITAMIN D-3) 5000 UNITS TABS Take 1 tablet by mouth daily.      . clopidogrel (PLAVIX) 75 MG tablet take 1 tablet by mouth once daily  90 tablet  3  . metoprolol succinate (TOPROL-XL) 50 MG 24 hr tablet take 1 tablet by mouth once daily  90 tablet  3  . Probiotic Product (PROBIOTIC PO) Take by mouth daily.      Marland Kitchen triamterene-hydrochlorothiazide (MAXZIDE) 75-50 MG per tablet take 1 tablet by mouth once daily  30 tablet  11   No current facility-administered medications for this visit.    LABS/IMAGING: No results found for this or any previous visit (from the past 48 hour(s)). No results found.  VITALS: BP 122/70  Pulse 95  Ht 5\' 9"  (1.753 m)  Wt 132 lb 1.6 oz (59.92 kg)  BMI 19.50 kg/m2  EXAM: General appearance: alert and no distress Neck: no carotid bruit and no JVD Lungs: clear to auscultation bilaterally Heart: regular rate and rhythm, S1, S2 normal, no murmur, click, rub or gallop Abdomen: soft, non-tender; bowel sounds normal; no masses,  no organomegaly Extremities: extremities normal, atraumatic, no cyanosis or edema Pulses: 2+ and symmetric Skin: Skin color, texture, turgor normal. No rashes or lesions Neurologic: Grossly normal Psych: Mood, affect normal  EKG: Sinus rhythm with occasional PVCs at 95  ASSESSMENT: 1. Progressive shortness of breath and decreased exercise  tolerance 2. Hypertension 3. History of TIA  PLAN: 1.   Mia [name] is actually done very well and continues exercise, appearing much younger than her chronologic age of 69. She continues to do the treadmill however recently she's had worsening exercise tolerance, progressive shortness of breath and slow her recovery time. I consider that this could represent ischemia. I would recommend an exercise nuclear stress test to further evaluate this. Her last study was in 2009. Her hypertension is well controlled. Plan to see her back to discuss results of her stress  test in a few weeks.  Pixie Casino, MD, [facility] Attending Cardiologist [facility]  HILTY,Kenneth C 08/06/2013, 1:32 PM

## 2013-08-13 ENCOUNTER — Telehealth (HOSPITAL_COMMUNITY): Payer: Self-pay

## 2013-08-17 ENCOUNTER — Encounter (HOSPITAL_COMMUNITY): Payer: Self-pay | Admitting: *Deleted

## 2013-08-18 ENCOUNTER — Ambulatory Visit (HOSPITAL_COMMUNITY)
Admission: RE | Admit: 2013-08-18 | Discharge: 2013-08-18 | Disposition: A | Discharge status: Home / Self Care | Payer: Medicare Other | Source: Ambulatory Visit | Attending: Cardiovascular Disease | Admitting: Cardiovascular Disease

## 2013-08-18 DIAGNOSIS — R5381 Other malaise: Secondary | ICD-10-CM | POA: Insufficient documentation

## 2013-08-18 DIAGNOSIS — I4949 Other premature depolarization: Secondary | ICD-10-CM | POA: Insufficient documentation

## 2013-08-18 DIAGNOSIS — R0602 Shortness of breath: Secondary | ICD-10-CM | POA: Insufficient documentation

## 2013-08-18 DIAGNOSIS — I493 Ventricular premature depolarization: Secondary | ICD-10-CM

## 2013-08-18 DIAGNOSIS — R5383 Other fatigue: Secondary | ICD-10-CM

## 2013-08-18 MED ORDER — TECHNETIUM TC 99M SESTAMIBI GENERIC - CARDIOLITE
10.6000 | Freq: Once | INTRAVENOUS | Status: AC | PRN
  Administered 2013-08-18: 11 via INTRAVENOUS

## 2013-08-18 MED ORDER — TECHNETIUM TC 99M SESTAMIBI GENERIC - CARDIOLITE
29.7000 | Freq: Once | INTRAVENOUS | Status: AC | PRN
  Administered 2013-08-18: 30 via INTRAVENOUS

## 2013-08-18 NOTE — Procedures (Addendum)
[facility] [address] [place] [address] [id number]  Cardiology Nuclear Med Study  Mia [name] is a 80 y.o. female     MRN : [id number]     DOB: 1925-12-11  Procedure Date: 08/18/2013  Nuclear Med Background Indication for Stress Test:  Surgical Clearance History:  No prior cardiac or respiratory history reported. Last Lexi MPI on 09/29/209-nonischemic;EF=92% Cardiac Risk Factors: Family History - CAD, History of Smoking, Hypertension, TIA and Hx of PVC's  Symptoms:  Dizziness, Fatigue, Light-Headedness, Palpitations and SOB   Nuclear Pre-Procedure Caffeine/Decaff Intake:  7:00pm NPO After: 5:00am   IV Site: R Forearm  IV 0.9% NS with Angio Cath:  22g  Chest Size (in):  n/a IV Started by: Azucena Cecil, RN  Height: 5\' 9"  (1.753 m)  Cup Size: B  BMI:  Body mass index is 19.48 kg/(m^2). Weight:  132 lb (59.875 kg)   Tech Comments:  n/a    Nuclear Med Study 1 or 2 day study: 1 day  Stress Test Type:  Stress  Order Authorizing Provider:  Lyman Bishop, MD   Resting Radionuclide: Technetium 25m Sestamibi  Resting Radionuclide Dose: 10.6 mCi   Stress Radionuclide:  Technetium 14m Sestamibi  Stress Radionuclide Dose: 29.7 mCi           Stress Protocol Rest HR:90 Stress HR: 142  Rest BP: 148/97 Stress BP:192/87  Exercise Time (min): 3:33 METS: 5.20   Predicted Max HR: 135 bpm % Max HR: 105.19 bpm Rate Pressure Product: 27264  Dose of Adenosine (mg):  n/a Dose of Lexiscan: n/a mg  Dose of Atropine (mg): n/a Dose of Dobutamine: n/a mcg/kg/min (at max HR)  Stress Test Technologist: Mellody Memos, CCT Nuclear Technologist: Imagene Riches, CNMT   Rest Procedure:  Myocardial perfusion imaging was performed at rest 45 minutes following the intravenous administration of Technetium 24m Sestamibi. Stress Procedure:  The patient performed treadmill exercise using a Bruce  Protocol for 3 minutes and 33 seconds. The patient  stopped due to generalized fatigue. Patient denied any chest pain.  There were no significant ST-T wave changes.  Technetium 21m Sestamibi was injected IV at peak exercise and myocardial perfusion imaging was performed after a brief delay.  Transient Ischemic Dilatation (Normal <1.22):  0.83 Lung/Heart Ratio (Normal <0.45):  0.31 QGS EDV:  31 ml QGS ESV:  2 ml LV Ejection Fraction: 92%        Rest ECG: NSR-RBBB  Stress ECG: There are scattered PACs.  QPS Raw Data Images:  Normal; no motion artifact; normal heart/lung ratio. Stress Images:  Normal homogeneous uptake in all areas of the myocardium. Rest Images:  Normal homogeneous uptake in all areas of the myocardium. Subtraction (SDS):  No evidence of ischemia. LV Wall Motion:  NL LV Function; NL Wall Motion  Impression Exercise Capacity:  Poor exercise capacity. BP Response:  Hypertensive blood pressure response. Clinical Symptoms:  No significant symptoms noted. ECG Impression:  No significant ST segment change suggestive of ischemia. Comparison with Prior Nuclear Study: No significant change from previous study   Overall Impression:  Normal stress nuclear study.   Sanda Klein, MD  08/18/2013 1:16 PM

## 2013-09-15 ENCOUNTER — Encounter: Payer: Self-pay | Admitting: Internal Medicine

## 2013-09-16 ENCOUNTER — Ambulatory Visit: Payer: 59 | Admitting: Internal Medicine

## 2013-09-16 ENCOUNTER — Ambulatory Visit (INDEPENDENT_AMBULATORY_CARE_PROVIDER_SITE_OTHER): Payer: Medicare Other | Admitting: Internal Medicine

## 2013-09-16 ENCOUNTER — Encounter: Payer: Self-pay | Admitting: Internal Medicine

## 2013-09-16 VITALS — BP 110/62 | HR 62 | Ht 69.0 in | Wt 132.5 lb

## 2013-09-16 DIAGNOSIS — R0609 Other forms of dyspnea: Secondary | ICD-10-CM

## 2013-09-16 DIAGNOSIS — R0600 Dyspnea, unspecified: Secondary | ICD-10-CM

## 2013-09-16 DIAGNOSIS — R0989 Other specified symptoms and signs involving the circulatory and respiratory systems: Secondary | ICD-10-CM

## 2013-09-16 DIAGNOSIS — I10 Essential (primary) hypertension: Secondary | ICD-10-CM

## 2013-09-16 NOTE — Progress Notes (Signed)
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 She has over the past year done very well, eats a pretty healthy diet and exercise without any difficulty. She continues to have some cold feet and hands but has had a negative rheumatologic workup with a negative ANA in the past. She does exercise about 3 times a week for 30 minutes. Recently she has noted worsening exercise tolerance and feels that she gets short of breath easier toward the end of exercise. In fact she cannot go nearly as far as she used to go. She denies any chest pain with this however she notes that it does take her longer to recover after exercise. Her last stress test was negative in 2009.  She returns today for followup of her stress test. This was negative for ischemia with an EF greater than 70%. She reports her shortness of breath is resolved and she's not noting palpitations as frequently as she had before.  PMHx:  Past Medical History  Diagnosis Date  . Hypertension   . TIA (transient ischemic attack)   . Former smoker     Past Surgical History  Procedure Laterality Date  . Transthoracic echocardiogram  12/2005    mild DUST, EF=>55%; mild mitral annular calcif; mild-mod aortic root calcif  . Nm myocar perf wall motion  01/2008    lexiscan myoview - normal pattern of perfusion in all regions, EF 92%, no significant ischemia demonstated    FAMHx:  Family History  Problem Relation Age of Onset  . Lung cancer Brother     SOCHx:   reports that she quit smoking about 13 years ago. She does not have any smokeless tobacco history on file. She reports that she drinks about 3.5 ounces of alcohol per week. She reports that she does not use illicit drugs.  ALLERGIES:  No Known Allergies  ROS: A comprehensive review of systems was negative.  HOME  MEDS: Current Outpatient Prescriptions  Medication Sig Dispense Refill  . amLODipine (NORVASC) 5 MG tablet take 1 tablet by mouth once daily  90 tablet  3  . Cholecalciferol (VITAMIN D-3) 5000 UNITS TABS Take 1 tablet by mouth daily.      . clopidogrel (PLAVIX) 75 MG tablet take 1 tablet by mouth once daily  90 tablet  3  . metoprolol succinate (TOPROL-XL) 50 MG 24 hr tablet take 1 tablet by mouth once daily  90 tablet  3  . Probiotic Product (PROBIOTIC PO) Take by mouth daily.      Marland Kitchen triamterene-hydrochlorothiazide (MAXZIDE) 75-50 MG per tablet take 1 tablet by mouth once daily  30 tablet  11   No current facility-administered medications for this visit.    LABS/IMAGING: No results found for this or any previous visit (from the past 48 hour(s)). No results found.  VITALS: BP 110/62  Pulse 62  Ht 5\' 9"  (1.753 m)  Wt 132 lb 8 oz (60.102 kg)  BMI 19.56 kg/m2  EXAM: deferred  EKG: deferred  ASSESSMENT: 1. Progressive shortness of breath and decreased exercise tolerance - negative nuclear stress test 2. Hypertension 3. History of TIA  PLAN: 1.   Mia [name] reports that her symptoms have improved. She's been able to go back to exercise and seems to be recovering better. Her palpitations have improved. I suspect this may be  due to some diastolic dysfunction. Her stress test is normal. I would recommend followup in one year and no changes to her medications at this time.  Pixie Casino, MD, [facility] Attending Cardiologist [place] 09/16/2013, 9:03 AM

## 2013-09-16 NOTE — Patient Instructions (Signed)
Your physician wants you to follow-up in: 1 year. You will receive a reminder letter in the mail two months in advance. If you don't receive a letter, please call our office to schedule the follow-up appointment.  

## 2013-09-23 ENCOUNTER — Ambulatory Visit (INDEPENDENT_AMBULATORY_CARE_PROVIDER_SITE_OTHER): Payer: 59 | Admitting: Internal Medicine

## 2013-09-23 ENCOUNTER — Encounter: Payer: Self-pay | Admitting: Internal Medicine

## 2013-09-23 VITALS — BP 120/72 | HR 95 | Temp 96.7°F | Resp 10 | Ht 67.0 in | Wt 131.4 lb

## 2013-09-23 DIAGNOSIS — E559 Vitamin D deficiency, unspecified: Secondary | ICD-10-CM

## 2013-09-23 DIAGNOSIS — R7309 Other abnormal glucose: Secondary | ICD-10-CM

## 2013-09-23 DIAGNOSIS — M1710 Unilateral primary osteoarthritis, unspecified knee: Secondary | ICD-10-CM

## 2013-09-23 DIAGNOSIS — IMO0002 Reserved for concepts with insufficient information to code with codable children: Secondary | ICD-10-CM

## 2013-09-23 DIAGNOSIS — I4949 Other premature depolarization: Secondary | ICD-10-CM

## 2013-09-23 DIAGNOSIS — E538 Deficiency of other specified B group vitamins: Secondary | ICD-10-CM | POA: Insufficient documentation

## 2013-09-23 DIAGNOSIS — I10 Essential (primary) hypertension: Secondary | ICD-10-CM

## 2013-09-23 DIAGNOSIS — R739 Hyperglycemia, unspecified: Secondary | ICD-10-CM

## 2013-09-23 DIAGNOSIS — I493 Ventricular premature depolarization: Secondary | ICD-10-CM | POA: Insufficient documentation

## 2013-09-23 DIAGNOSIS — M1711 Unilateral primary osteoarthritis, right knee: Secondary | ICD-10-CM | POA: Insufficient documentation

## 2013-09-23 NOTE — Progress Notes (Signed)
Patient ID: Mia [name], female   DOB: [birth year], 80 y.o.   MRN: [id number]   Location:  [facility]  Code Status:  Has living will--brought a copy.  Will do a health care power of attorney.  Given MOST to discuss with family and bring back for [place] to complete next time.    No Known Allergies  Chief Complaint  Patient presents with  . Establish Care    New patient establish care  . Orders    Order for bone density    HPI: Patient is a 80 y.o. black female  seen in the office today to establish.  Moved here when her husband retired from [employer].    Thinks she is getting neuropathy.   Burning and discoloration of bottoms of her feet at times--they itch and burn.  Seems to be getting worse.  Doesn't know what last hba1c was, but borderline.  Follows healthy diet and exercises regularly.     Has pvcs--sees cardiology for this.  Showed up on EKG and had stress test.  Does not actually feel the pvcs.  Stress test was normal.  Only needs to see him once a year.  Dr. Debara Pickett.    V fib many years ago she says--10 years--heart rate was fluctuating significantly--felt like it would stop.  Felt like she would pass out.  Had a different cardiologist then--was put on plavix then to not throw clots.  Has not had any more TIAs.  Does not sound like she had TIAs at all, but palpitations--? Truly vfib.  Says she did not have afib.  Has had hypertension most of her adult life.  On amlodipine, toprol xl and triamterene/hctz for this.  BP excellent today.  No dizziness or lightheadedness.  Had previously been having trouble recovering from exercise--was reason for stress test.     Requested her records from Dr. Criss Rosales.    Has had gout--no recent flares.  Does natural stuff--tart cherry juice and avoiding red meat, chicken.  Eats overall a lot of fruits and veggies.  Did not tolerate colcrys well--had diarrhea, abdominal pain and sedation.    Iritis:  Had cataracts  removed several years ago.  Was getting periodic flare-ups--had steroid drops off and on for a while.  Is now seeing Dr. Herbert Deaner.  Was having vision difficulties and referred her to retina specialist b/c of macular degeneration.  Had injection to help with flare and put her on drops.  Vision doing fine now.    Review of Systems:  Review of Systems  Constitutional: Negative for fever, chills, weight loss and malaise/fatigue.  HENT: Negative for congestion.   Eyes: Positive for redness. Negative for blurred vision.       See hpi  Respiratory: Negative for shortness of breath.   Cardiovascular: Positive for palpitations. Negative for chest pain and leg swelling.  Gastrointestinal: Negative for heartburn, abdominal pain, constipation, blood in stool and melena.  Genitourinary: Negative for dysuria, urgency and frequency.  Musculoskeletal: Positive for joint pain. Negative for falls and myalgias.  Skin: Negative for rash.  Neurological: Negative for dizziness, loss of consciousness and weakness.  Endo/Heme/Allergies: Does not bruise/bleed easily.  Psychiatric/Behavioral: Negative for depression and memory loss. The patient is not nervous/anxious.      Past Medical History  Diagnosis Date  . Hypertension   . TIA (transient ischemic attack)   . Former smoker     Past Surgical History  Procedure Laterality Date  .  Transthoracic echocardiogram  12/2005    mild DUST, EF=>55%; mild mitral annular calcif; mild-mod aortic root calcif  . Nm myocar perf wall motion  01/2008    lexiscan myoview - normal pattern of perfusion in all regions, EF 92%, no significant ischemia demonstated    Social History:   reports that she quit smoking about 13 years ago. She does not have any smokeless tobacco history on file. She reports that she drinks about 4.2 ounces of alcohol per week. She reports that she does not use illicit drugs.  Family History  Problem Relation Age of Onset  . Lung cancer Brother   .  Congestive Heart Failure Mother   . Prostate cancer Son   . Bipolar disorder Son     Medications: Patient's Medications  New Prescriptions   No medications on file  Previous Medications   CHOLECALCIFEROL (VITAMIN D-3) 5000 UNITS TABS    Take 1 tablet by mouth daily.   KETOROLAC (ACULAR) 0.5 % OPHTHALMIC SOLUTION    Place 1 drop into the right eye 2 (two) times daily.   LUTEIN PO    Take by mouth daily.   PROBIOTIC PRODUCT (PROBIOTIC PO)    Take by mouth daily.   VITAMIN B-12 (CYANOCOBALAMIN) 1000 MCG TABLET    Take 1,000 mcg by mouth daily.  Modified Medications   Modified Medication Previous Medication   AMLODIPINE (NORVASC) 5 MG TABLET amLODipine (NORVASC) 5 MG tablet      take 1 tablet by mouth once daily for blood pressure    take 1 tablet by mouth once daily   CLOPIDOGREL (PLAVIX) 75 MG TABLET clopidogrel (PLAVIX) 75 MG tablet      take 1 tablet by mouth once daily for clot prevention    take 1 tablet by mouth once daily   METOPROLOL SUCCINATE (TOPROL-XL) 50 MG 24 HR TABLET metoprolol succinate (TOPROL-XL) 50 MG 24 hr tablet      take 1 tablet by mouth once daily for blood pressure    take 1 tablet by mouth once daily   TRIAMTERENE-HYDROCHLOROTHIAZIDE (MAXZIDE) 75-50 MG PER TABLET triamterene-hydrochlorothiazide (MAXZIDE) 75-50 MG per tablet      take 1 tablet by mouth once daily for blood pressure    take 1 tablet by mouth once daily  Discontinued Medications   No medications on file     Physical Exam: Filed Vitals:   09/23/13 0916  BP: 120/72  Pulse: 95  Temp: 96.7 F (35.9 C)  TempSrc: Oral  Resp: 10  Height: 5\' 7"  (1.702 m)  Weight: 131 lb 6.4 oz (59.603 kg)  SpO2: 93%  Physical Exam  Constitutional: She is oriented to person, place, and time. She appears well-developed and well-nourished. No distress.  HENT:  Head: Normocephalic and atraumatic.  Right Ear: External ear normal.  Left Ear: External ear normal.  Nose: Nose normal.  Mouth/Throat: Oropharynx is  clear and moist. No oropharyngeal exudate.  Eyes: Conjunctivae and EOM are normal. Pupils are equal, round, and reactive to light.  Neck: Normal range of motion. Neck supple. No JVD present. No tracheal deviation present. No thyromegaly present.  Cardiovascular: Normal heart sounds and intact distal pulses.   Frequent pvcs  Pulmonary/Chest: Effort normal and breath sounds normal. No respiratory distress.  Abdominal: Soft. Bowel sounds are normal. She exhibits no distension and no mass. There is no tenderness.  Musculoskeletal: Normal range of motion. She exhibits tenderness.  Of right knee with warmth and edema present  Neurological: She is alert and  oriented to person, place, and time. She has normal reflexes.  Skin: Skin is warm and dry.  Psychiatric: She has a normal mood and affect.    Labs reviewed: None recently   Assessment/Plan 1. B12 deficiency - is taking a supplement - CBC With differential/Platelet - B12 and Folate Panel  2. Essential hypertension, benign -bp at goal with three medications -f/u electrolytes today  3. PVC's (premature ventricular contractions) - continue to f/u with Dr. Debara Pickett annually - she tells me about TIAs and vfib, but I don't see this in her records - Comprehensive metabolic panel  4. Vitamin D deficiency - cont vitamin d supplement and check level today  5. Hyperglycemia - tells me her sugar has been trending up and she's been getting neuropathic pain - Hemoglobin A1c  6.  Right knee osteoarthritis -has been getting worse lately -will plan to obtain xrays next time in order to assess severity--may want knee injections or referral to ortho  Labs/tests ordered: Orders Placed This Encounter  Procedures  . Comprehensive metabolic panel  . Hemoglobin A1c  . CBC With differential/Platelet  . B12 and Folate Panel    Next appt:  6 mos for annual exam

## 2013-09-24 ENCOUNTER — Encounter: Payer: Self-pay | Admitting: *Deleted

## 2013-09-24 LAB — COMPREHENSIVE METABOLIC PANEL
ALT: 12 IU/L (ref 0–32)
AST: 21 IU/L (ref 0–40)
Albumin/Globulin Ratio: 2.1 (ref 1.1–2.5)
Albumin: 4.5 g/dL (ref 3.5–4.7)
Alkaline Phosphatase: 56 IU/L (ref 39–117)
BUN/Creatinine Ratio: 18 (ref 11–26)
BUN: 27 mg/dL (ref 8–27)
CO2: 25 mmol/L (ref 18–29)
Calcium: 10.3 mg/dL (ref 8.7–10.3)
Chloride: 99 mmol/L (ref 97–108)
Creatinine, Ser: 1.52 mg/dL — ABNORMAL HIGH (ref 0.57–1.00)
GFR calc Af Amer: 36 mL/min/{1.73_m2} — ABNORMAL LOW (ref >59)
GFR calc non Af Amer: 31 mL/min/{1.73_m2} — ABNORMAL LOW (ref >59)
Globulin, Total: 2.1 g/dL (ref 1.5–4.5)
Glucose: 103 mg/dL — ABNORMAL HIGH (ref 65–99)
Potassium: 4.7 mmol/L (ref 3.5–5.2)
Sodium: 142 mmol/L (ref 134–144)
Total Bilirubin: 0.3 mg/dL (ref 0.0–1.2)
Total Protein: 6.6 g/dL (ref 6.0–8.5)

## 2013-09-24 LAB — CBC WITH DIFFERENTIAL
Basophils Absolute: 0 10*3/uL (ref 0.0–0.2)
Basos: 0 %
Eos: 1 %
Eosinophils Absolute: 0.1 10*3/uL (ref 0.0–0.4)
HCT: 41.5 % (ref 34.0–46.6)
Hemoglobin: 13.5 g/dL (ref 11.1–15.9)
Immature Grans (Abs): 0 10*3/uL (ref 0.0–0.1)
Immature Granulocytes: 0 %
Lymphocytes Absolute: 0.9 10*3/uL (ref 0.7–3.1)
Lymphs: 15 %
MCH: 30.3 pg (ref 26.6–33.0)
MCHC: 32.5 g/dL (ref 31.5–35.7)
MCV: 93 fL (ref 79–97)
Monocytes Absolute: 0.5 10*3/uL (ref 0.1–0.9)
Monocytes: 8 %
Neutrophils Absolute: 4.6 10*3/uL (ref 1.4–7.0)
Neutrophils Relative %: 76 %
Platelets: 219 10*3/uL (ref 150–379)
RBC: 4.46 x10E6/uL (ref 3.77–5.28)
RDW: 14.7 % (ref 12.3–15.4)
WBC: 6 10*3/uL (ref 3.4–10.8)

## 2013-09-24 LAB — HEMOGLOBIN A1C
Est. average glucose Bld gHb Est-mCnc: 140 mg/dL
Hgb A1c MFr Bld: 6.5 % — ABNORMAL HIGH (ref 4.8–5.6)

## 2013-09-24 LAB — B12 AND FOLATE PANEL
Folate: 15 ng/mL (ref >3.0)
Vitamin B-12: 704 pg/mL (ref 211–946)

## 2013-09-30 ENCOUNTER — Encounter: Payer: Self-pay | Admitting: *Deleted

## 2013-11-17 NOTE — Telephone Encounter (Signed)
Encounter complete. 

## 2013-11-24 ENCOUNTER — Ambulatory Visit (INDEPENDENT_AMBULATORY_CARE_PROVIDER_SITE_OTHER): Payer: 59 | Admitting: Internal Medicine

## 2013-11-24 ENCOUNTER — Encounter: Payer: Self-pay | Admitting: Internal Medicine

## 2013-11-24 VITALS — BP 144/82 | HR 84 | Temp 98.6°F | Wt 128.8 lb

## 2013-11-24 DIAGNOSIS — L0231 Cutaneous abscess of buttock: Secondary | ICD-10-CM

## 2013-11-24 DIAGNOSIS — T2112XA Burn of first degree of abdominal wall, initial encounter: Secondary | ICD-10-CM

## 2013-11-24 DIAGNOSIS — L03317 Cellulitis of buttock: Secondary | ICD-10-CM

## 2013-11-24 MED ORDER — TRAMADOL HCL 50 MG PO TABS: 50.0000 mg | ORAL_TABLET | Freq: Three times a day (TID) | ORAL | Status: DC | PRN

## 2013-11-24 MED ORDER — SILVER SULFADIAZINE 1 % EX CREA: 1.0000 "application " | TOPICAL_CREAM | Freq: Every day | CUTANEOUS | Status: DC

## 2013-11-24 MED ORDER — ZINC SULFATE 220 (50 ZN) MG PO CAPS: 220.0000 mg | ORAL_CAPSULE | Freq: Two times a day (BID) | ORAL | Status: DC

## 2013-11-24 MED ORDER — VITAMIN C 500 MG PO TABS: 500.0000 mg | ORAL_TABLET | Freq: Two times a day (BID) | ORAL | Status: DC

## 2013-11-24 MED ORDER — SULFAMETHOXAZOLE-TMP DS 800-160 MG PO TABS: 1.0000 | ORAL_TABLET | Freq: Two times a day (BID) | ORAL | Status: DC
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
## 2013-12-15 ENCOUNTER — Ambulatory Visit (INDEPENDENT_AMBULATORY_CARE_PROVIDER_SITE_OTHER): Payer: 59 | Admitting: Internal Medicine

## 2013-12-15 ENCOUNTER — Encounter: Payer: Self-pay | Admitting: Internal Medicine

## 2013-12-15 VITALS — BP 110/68 | HR 68 | Temp 96.6°F | Ht 66.5 in | Wt 128.0 lb

## 2013-12-15 DIAGNOSIS — T2112XA Burn of first degree of abdominal wall, initial encounter: Secondary | ICD-10-CM

## 2013-12-15 MED ORDER — ZINC SULFATE 220 (50 ZN) MG PO CAPS: 220.0000 mg | ORAL_CAPSULE | Freq: Every day | ORAL | Status: DC
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
## 2014-01-12 ENCOUNTER — Ambulatory Visit: Payer: 59 | Admitting: Internal Medicine

## 2014-03-31 ENCOUNTER — Encounter: Payer: 59 | Admitting: Internal Medicine

## 2014-07-15 ENCOUNTER — Ambulatory Visit (INDEPENDENT_AMBULATORY_CARE_PROVIDER_SITE_OTHER): Payer: 59 | Admitting: Internal Medicine

## 2014-07-15 ENCOUNTER — Encounter: Payer: Self-pay | Admitting: Internal Medicine

## 2014-07-15 VITALS — BP 126/68 | HR 72 | Temp 96.5°F | Resp 12 | Ht 67.0 in | Wt 129.0 lb

## 2014-07-15 DIAGNOSIS — Z13220 Encounter for screening for lipoid disorders: Secondary | ICD-10-CM

## 2014-07-15 DIAGNOSIS — E538 Deficiency of other specified B group vitamins: Secondary | ICD-10-CM

## 2014-07-15 DIAGNOSIS — E559 Vitamin D deficiency, unspecified: Secondary | ICD-10-CM | POA: Diagnosis not present

## 2014-07-15 DIAGNOSIS — E2839 Other primary ovarian failure: Secondary | ICD-10-CM | POA: Diagnosis not present

## 2014-07-15 DIAGNOSIS — L853 Xerosis cutis: Secondary | ICD-10-CM

## 2014-07-15 DIAGNOSIS — Z23 Encounter for immunization: Secondary | ICD-10-CM

## 2014-07-15 DIAGNOSIS — M1711 Unilateral primary osteoarthritis, right knee: Secondary | ICD-10-CM

## 2014-07-15 DIAGNOSIS — R739 Hyperglycemia, unspecified: Secondary | ICD-10-CM | POA: Diagnosis not present

## 2014-07-15 DIAGNOSIS — G3184 Mild cognitive impairment, so stated: Secondary | ICD-10-CM

## 2014-07-15 DIAGNOSIS — Z0000 Encounter for general adult medical examination without abnormal findings: Secondary | ICD-10-CM | POA: Diagnosis not present

## 2014-07-15 DIAGNOSIS — I10 Essential (primary) hypertension: Secondary | ICD-10-CM

## 2014-07-15 NOTE — Progress Notes (Signed)
Passed clock drawing 

## 2014-07-15 NOTE — Patient Instructions (Addendum)
Oil of olay quench  Stop amlodipine.  If your bp goes up over 150/90 at least one hour after you've taken your other medications on 3 consecutive days, please restart amlodipine 1/2 tablet and continue to check your blood pressure.

## 2014-07-15 NOTE — Progress Notes (Signed)
Patient ID: Mia [name], female   DOB: Mar 10, 1926, 81 y.o.   MRN: [id number]   Location:  [facility] / [place]  Advanced Directive information Does patient have an advance directive?: Yes, Type of Advance Directive: Healthcare Power of [place];Living will, Does patient want to make changes to advanced directive?: No - Patient declined  No Known Allergies  Chief Complaint  Patient presents with  . Annual Exam    Yearly check-up, not fasting. MMSE 26/30 (passed clock drawing ). Discuss B/P medications- discuss d/c'ing   . Immunizations    Prevnar 13 today, refuse TDAP  . Orders    BMD order request     HPI: Patient is a 81 y.o. female seen in the office today for her annual exam.  She has no concerns herself.  Stopped triamterene/hctz due to kidney function, but went back on b/c her bp was up for 3 consecutive days over 150/90.    She scored 26/30, passed clock--had some difficulty figuring out long and short hands and not in middle of clock.  Says she knows she has a few memory glitches, but can make adjustments/compensate for them.     Had spilled hot coffee in her lap--seems to have permanent scars.  No longer doing mammograms or pap smears or colonoscopies.  Says she would not want intervention if she had any of these and a malignancy was identified at her age.   Has previously had a bone density through Dr. Fransico Setters office.  Bone density was normal at that time--about 10-15 years ago.  Is interested in repeat.    Has terribly dry skin.  Did step up her fluids.  Does use a lot of lotion already.  Says she could get a vaporizer.  Showers every other day.    Refuses tdap.  Getting prevnar today.  Had pneumovax.  Check screening lipids.  Had MMSE 26/30 with mild cognitive impairment.  No depression.  No falls.  Bone density to be ordered.    Review of Systems:  Review of Systems  Constitutional: Negative for fever and chills.  HENT: Negative for  congestion and hearing loss.   Eyes: Positive for blurred vision.       Failed visual fields she tells me, following up with ophtho  Respiratory: Negative for shortness of breath.   Cardiovascular: Negative for chest pain.  Gastrointestinal: Negative for abdominal pain, constipation, blood in stool and melena.       Using probiotic for constipation with benefit  Genitourinary: Positive for urgency. Negative for dysuria, frequency and hematuria.  Musculoskeletal: Positive for joint pain. Negative for myalgias and falls.  Skin: Negative for rash.  Neurological: Negative for dizziness, loss of consciousness and headaches.  Endo/Heme/Allergies: Does not bruise/bleed easily.  Psychiatric/Behavioral: Positive for memory loss. Negative for depression. The patient is not nervous/anxious and does not have insomnia.     Past Medical History  Diagnosis Date  . Hypertension   . TIA (transient ischemic attack)   . Former smoker     Past Surgical History  Procedure Laterality Date  . Transthoracic echocardiogram  12/2005    mild DUST, EF=>55%; mild mitral annular calcif; mild-mod aortic root calcif  . Nm myocar perf wall motion  01/2008    lexiscan myoview - normal pattern of perfusion in all regions, EF 92%, no significant ischemia demonstated    Social History:   reports that she quit smoking about 13 years ago. She does not have any smokeless tobacco history  on file. She reports that she drinks about 4.2 oz of alcohol per week. She reports that she does not use illicit drugs.  Family History  Problem Relation Age of Onset  . Lung cancer Brother   . Congestive Heart Failure Mother   . Prostate cancer Son   . Bipolar disorder Son     Medications: Patient's Medications  New Prescriptions   No medications on file  Previous Medications   AMLODIPINE (NORVASC) 5 MG TABLET    take 1 tablet by mouth once daily for blood pressure   CHOLECALCIFEROL (VITAMIN D-3) 5000 UNITS TABS    Take 1  tablet by mouth daily.   CLOPIDOGREL (PLAVIX) 75 MG TABLET    take 1 tablet by mouth once daily for clot prevention   KETOROLAC (ACULAR) 0.5 % OPHTHALMIC SOLUTION    Place 1 drop into the right eye 2 (two) times daily.   LUTEIN PO    Take by mouth daily.   METOPROLOL SUCCINATE (TOPROL-XL) 50 MG 24 HR TABLET    take 1 tablet by mouth once daily for blood pressure   PROBIOTIC PRODUCT (PROBIOTIC PO)    Take by mouth daily.   TRIAMTERENE-HYDROCHLOROTHIAZIDE (MAXZIDE) 75-50 MG PER TABLET    take 1 tablet by mouth once daily for blood pressure  Modified Medications   No medications on file  Discontinued Medications   SILVER SULFADIAZINE (SILVADENE) 1 % CREAM    Apply 1 application topically daily.   TRAMADOL (ULTRAM) 50 MG TABLET    Take 1 tablet (50 mg total) by mouth every 8 (eight) hours as needed.   VITAMIN B-12 (CYANOCOBALAMIN) 1000 MCG TABLET    Take 1,000 mcg by mouth daily.   VITAMIN C (ASCORBIC ACID) 500 MG TABLET    Take 1 tablet (500 mg total) by mouth 2 (two) times daily.   ZINC SULFATE (ZINCATE) 220 MG CAPSULE    Take 1 capsule (220 mg total) by mouth daily.     Physical Exam: Filed Vitals:   07/15/14 0854  BP: 126/68  Pulse: 72  Temp: 96.5 F (35.8 C)  TempSrc: Oral  Resp: 12  Height: 5\' 7"  (1.702 m)  Weight: 129 lb (58.514 kg)  Physical Exam  Constitutional: She is oriented to person, place, and time. She appears well-developed and well-nourished. No distress.  Thin black female  HENT:  Head: Normocephalic and atraumatic.  Right Ear: External ear normal.  Left Ear: External ear normal.  Nose: Nose normal.  Mouth/Throat: Oropharynx is clear and moist. No oropharyngeal exudate.  Eyes: Conjunctivae and EOM are normal. Pupils are equal, round, and reactive to light.  Neck: Normal range of motion. Neck supple. No JVD present. No tracheal deviation present. No thyromegaly present.  Cardiovascular: Normal rate, regular rhythm, normal heart sounds and intact distal pulses.     Pulmonary/Chest: Effort normal and breath sounds normal. No respiratory distress. Right breast exhibits no inverted nipple, no mass, no nipple discharge, no skin change and no tenderness. Left breast exhibits no inverted nipple, no mass, no nipple discharge, no skin change and no tenderness.  Abdominal: Soft. Bowel sounds are normal. She exhibits no distension and no mass. There is no tenderness.  Musculoskeletal: Normal range of motion. She exhibits no edema or tenderness.  Lymphadenopathy:    She has no cervical adenopathy.  Neurological: She is alert and oriented to person, place, and time. She has normal reflexes. No cranial nerve deficit.  Skin: Skin is warm and dry.  Psychiatric: She has  a normal mood and affect.    Labs reviewed: Basic Metabolic Panel:  Recent Labs  09/23/13 1036  NA 142  K 4.7  CL 99  CO2 25  GLUCOSE 103*  BUN 27  CREATININE 1.52*  CALCIUM 10.3   Liver Function Tests:  Recent Labs  09/23/13 1036  AST 21  ALT 12  ALKPHOS 56  BILITOT 0.3  PROT 6.6   No results for input(s): LIPASE, AMYLASE in the last 8760 hours. No results for input(s): AMMONIA in the last 8760 hours. CBC:  Recent Labs  09/23/13 1036  WBC 6.0  NEUTROABS 4.6  HGB 13.5  HCT 41.5  MCV 93  PLT 219   Lipid Panel: No results for input(s): CHOL, HDL, LDLCALC, TRIG, CHOLHDL, LDLDIRECT in the last 8760 hours. Lab Results  Component Value Date   HGBA1C 6.5* 09/23/2013    Assessment/Plan 1. Routine general medical examination at a health care facility - see hpi - CBC with Differential/Platelet; Future  2. Hyperglycemia -eats healthy balanced diet and walks for exercise -f/u labs: - Comprehensive metabolic panel; Future - Hemoglobin A1c; Future  3. Essential hypertension, benign -advised to try stopping the amlodipine and monitor her bp for goal <150/90 at her age -she tried stopped the triamterene hctz due to her renal function, but bp went up, so she restarted  it -recheck renal function: - Comprehensive metabolic panel; Future  4. B12 deficiency -cont supplement and check levels - B12 and Folate Panel; Future  5. Vitamin D deficiency - cont 5000 units daily and check level monday - Vit D  25 hydroxy (rtn osteoporosis monitoring); Future - DG Bone Density; Future  6. Primary osteoarthritis of right knee -stable lately, no complaints today  7. Xerosis cutis -discussed trying olay quench when she showers--otherwise continue interventions she's made and get humidifier for winter  8. Mild cognitive impairment - 26/30 on mmse, some trouble with clock but ok when done -says she makes adjustments - CBC with Differential/Platelet; Future  9. Screening, lipid -not on meds for cholesterol  - Lipid panel; Future  10. Estrogen deficiency -obviously at 79, but this is what covers her bone density - DG Bone Density; Future  11. Need for vaccination with 13-polyvalent pneumococcal conjugate vaccine -prevnar given today  Labs/tests ordered:   Orders Placed This Encounter  Procedures  . DG Bone Density    Standing Status: Future     Number of Occurrences:      Standing Expiration Date: 09/13/2015    Order Specific Question:  Reason for Exam (SYMPTOM  OR DIAGNOSIS REQUIRED)    Answer:  estrogen deficiency; last BMD was more than 10 years ago    Order Specific Question:  Preferred imaging location?    Answer:  External     Comments:  [facility]  . Pneumococcal conjugate vaccine 13-valent  . CBC with Differential/Platelet    Standing Status: Future     Number of Occurrences:      Standing Expiration Date: 10/13/2014  . Comprehensive metabolic panel    Standing Status: Future     Number of Occurrences:      Standing Expiration Date: 10/13/2014    Order Specific Question:  Has the patient fasted?    Answer:  Yes  . Lipid panel    Standing Status: Future     Number of Occurrences:      Standing Expiration Date: 10/13/2014    Order Specific  Question:  Has the patient fasted?    Answer:  Yes  . Hemoglobin A1c    Standing Status: Future     Number of Occurrences:      Standing Expiration Date: 10/13/2014  . Vit D  25 hydroxy (rtn osteoporosis monitoring)    Standing Status: Future     Number of Occurrences:      Standing Expiration Date: 10/13/2014  . B12 and Folate Panel    Standing Status: Future     Number of Occurrences:      Standing Expiration Date: 10/13/2014    Next appt:  6 mos; but come for labs [place]. Cierria Height, D.O. [facility] [address], [place] [address] Cell Phone (Mon-Fri 8am-5pm):  [phone] On Call:  [phone] & follow prompts after 5pm & weekends Office Phone:  [phone] Office Fax:  [phone]

## 2014-07-17 ENCOUNTER — Other Ambulatory Visit: Payer: Self-pay | Admitting: Internal Medicine

## 2014-07-18 ENCOUNTER — Other Ambulatory Visit: Payer: 59

## 2014-07-18 DIAGNOSIS — Z13220 Encounter for screening for lipoid disorders: Secondary | ICD-10-CM

## 2014-07-18 DIAGNOSIS — E538 Deficiency of other specified B group vitamins: Secondary | ICD-10-CM

## 2014-07-18 DIAGNOSIS — E559 Vitamin D deficiency, unspecified: Secondary | ICD-10-CM

## 2014-07-18 DIAGNOSIS — R739 Hyperglycemia, unspecified: Secondary | ICD-10-CM

## 2014-07-18 DIAGNOSIS — I10 Essential (primary) hypertension: Secondary | ICD-10-CM

## 2014-07-18 DIAGNOSIS — G3184 Mild cognitive impairment, so stated: Secondary | ICD-10-CM

## 2014-07-18 DIAGNOSIS — Z0000 Encounter for general adult medical examination without abnormal findings: Secondary | ICD-10-CM

## 2014-07-18 NOTE — Telephone Encounter (Signed)
Rx has been sent to the pharmacy electronically. ° °

## 2014-07-19 LAB — CBC WITH DIFFERENTIAL/PLATELET
Basophils Absolute: 0 10*3/uL (ref 0.0–0.2)
Basos: 1 %
Eos: 5 %
Eosinophils Absolute: 0.2 10*3/uL (ref 0.0–0.4)
HCT: 40 % (ref 34.0–46.6)
Hemoglobin: 13.4 g/dL (ref 11.1–15.9)
Immature Grans (Abs): 0 10*3/uL (ref 0.0–0.1)
Immature Granulocytes: 0 %
Lymphocytes Absolute: 0.9 10*3/uL (ref 0.7–3.1)
Lymphs: 21 %
MCH: 30.3 pg (ref 26.6–33.0)
MCHC: 33.5 g/dL (ref 31.5–35.7)
MCV: 91 fL (ref 79–97)
Monocytes Absolute: 0.4 10*3/uL (ref 0.1–0.9)
Monocytes: 9 %
Neutrophils Absolute: 2.8 10*3/uL (ref 1.4–7.0)
Neutrophils Relative %: 64 %
Platelets: 213 10*3/uL (ref 150–379)
RBC: 4.42 x10E6/uL (ref 3.77–5.28)
RDW: 15.6 % — ABNORMAL HIGH (ref 12.3–15.4)
WBC: 4.4 10*3/uL (ref 3.4–10.8)

## 2014-07-19 LAB — COMPREHENSIVE METABOLIC PANEL WITH GFR
ALT: 12 IU/L (ref 0–32)
AST: 21 IU/L (ref 0–40)
Albumin/Globulin Ratio: 1.8 (ref 1.1–2.5)
Albumin: 4.2 g/dL (ref 3.5–4.7)
Alkaline Phosphatase: 51 IU/L (ref 39–117)
BUN/Creatinine Ratio: 14 (ref 11–26)
BUN: 25 mg/dL (ref 8–27)
Bilirubin Total: 0.4 mg/dL (ref 0.0–1.2)
CO2: 24 mmol/L (ref 18–29)
Calcium: 10 mg/dL (ref 8.7–10.3)
Chloride: 100 mmol/L (ref 97–108)
Creatinine, Ser: 1.74 mg/dL — ABNORMAL HIGH (ref 0.57–1.00)
GFR calc Af Amer: 30 mL/min/1.73 — ABNORMAL LOW
GFR calc non Af Amer: 26 mL/min/1.73 — ABNORMAL LOW
Globulin, Total: 2.3 g/dL (ref 1.5–4.5)
Glucose: 99 mg/dL (ref 65–99)
Potassium: 3.7 mmol/L (ref 3.5–5.2)
Sodium: 141 mmol/L (ref 134–144)
Total Protein: 6.5 g/dL (ref 6.0–8.5)

## 2014-07-19 LAB — LIPID PANEL
Chol/HDL Ratio: 1.9 ratio units (ref 0.0–4.4)
Cholesterol, Total: 153 mg/dL (ref 100–199)
HDL: 79 mg/dL (ref >39)
LDL Calculated: 59 mg/dL (ref 0–99)
Triglycerides: 77 mg/dL (ref 0–149)
VLDL Cholesterol Cal: 15 mg/dL (ref 5–40)

## 2014-07-19 LAB — HEMOGLOBIN A1C
Est. average glucose Bld gHb Est-mCnc: 143 mg/dL
Hgb A1c MFr Bld: 6.6 % — ABNORMAL HIGH (ref 4.8–5.6)

## 2014-07-19 LAB — VITAMIN D 25 HYDROXY (VIT D DEFICIENCY, FRACTURES): Vit D, 25-Hydroxy: 85.4 ng/mL (ref 30.0–100.0)

## 2014-07-19 LAB — B12 AND FOLATE PANEL
Folate: 12.7 ng/mL (ref >3.0)
Vitamin B-12: 459 pg/mL (ref 211–946)

## 2014-08-07 ENCOUNTER — Other Ambulatory Visit: Payer: Self-pay | Admitting: Internal Medicine

## 2014-08-08 NOTE — Telephone Encounter (Signed)
Rx(s) sent to pharmacy electronically.  

## 2014-08-11 ENCOUNTER — Telehealth: Payer: Self-pay | Admitting: *Deleted

## 2014-08-11 NOTE — Telephone Encounter (Signed)
Patient called and left voice mail message that she want a referral to a Diabetic Specialist for her Diabetes. Reviewing her chart I do not see a diagnosis of Diabetes. Called and left message on patient's voicemail to return my call.

## 2014-08-16 ENCOUNTER — Other Ambulatory Visit: Payer: Self-pay | Admitting: Internal Medicine

## 2014-08-16 NOTE — Telephone Encounter (Signed)
Rx has been sent to the pharmacy electronically. ° °

## 2014-08-29 ENCOUNTER — Encounter: Payer: Self-pay | Admitting: Internal Medicine

## 2014-08-29 ENCOUNTER — Telehealth: Payer: Self-pay | Admitting: Internal Medicine

## 2014-08-30 NOTE — Telephone Encounter (Signed)
Close encounter 

## 2014-09-16 ENCOUNTER — Ambulatory Visit: Payer: TRICARE For Life (TFL) | Admitting: Internal Medicine

## 2014-09-29 ENCOUNTER — Ambulatory Visit (INDEPENDENT_AMBULATORY_CARE_PROVIDER_SITE_OTHER): Payer: Medicare Other | Admitting: Internal Medicine

## 2014-09-29 ENCOUNTER — Encounter: Payer: Self-pay | Admitting: Internal Medicine

## 2014-09-29 VITALS — BP 132/70 | HR 72 | Ht 67.5 in | Wt 126.4 lb

## 2014-09-29 DIAGNOSIS — I10 Essential (primary) hypertension: Secondary | ICD-10-CM

## 2014-09-29 DIAGNOSIS — Z8673 Personal history of transient ischemic attack (TIA), and cerebral infarction without residual deficits: Secondary | ICD-10-CM | POA: Diagnosis not present

## 2014-09-29 DIAGNOSIS — I493 Ventricular premature depolarization: Secondary | ICD-10-CM

## 2014-09-29 MED ORDER — AMLODIPINE BESYLATE 5 MG PO TABS: 5.0000 mg | ORAL_TABLET | Freq: Every day | ORAL | Status: DC

## 2014-09-29 NOTE — Progress Notes (Signed)
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 She has over the past year done very well, eats a pretty healthy diet and exercise without any difficulty. She continues to have some cold feet and hands but has had a negative rheumatologic workup with a negative ANA in the past. She does exercise about 3 times a week for 30 minutes. Recently she has noted worsening exercise tolerance and feels that she gets short of breath easier toward the end of exercise. In fact she cannot go nearly as far as she used to go. She denies any chest pain with this however she notes that it does take her longer to recover after exercise. Her last stress test was negative in 2009.  She returns today for followup of her stress test. This was negative for ischemia with an EF greater than 70%. She reports her shortness of breath is resolved and she's not noting palpitations as frequently as she had before.  I saw mod Kious back in the office today. Overall she is doing well without any complaints. Unfortunate she's been missing some meals and is grieving over the loss of her son which was unexpected. She denies any chest pain or worsening shortness of breath. Weight is getting to be a little bit low and she is aware that she needs to increase her calorie intake. Overall though she denies any palpitations.  PMHx:  Past Medical History  Diagnosis Date  . Hypertension   . TIA (transient ischemic attack)   . Former smoker     Past Surgical History  Procedure Laterality Date  . Transthoracic echocardiogram  12/2005    mild DUST, EF=>55%; mild mitral annular calcif; mild-mod aortic root calcif  . Nm myocar perf wall motion  01/2008    lexiscan myoview - normal pattern of perfusion in all regions, EF 92%, no significant ischemia demonstated      FAMHx:  Family History  Problem Relation Age of Onset  . Lung cancer Brother   . Congestive Heart Failure Mother   . Prostate cancer Son   . Bipolar disorder Son     SOCHx:   reports that she quit smoking about 14 years ago. She does not have any smokeless tobacco history on file. She reports that she drinks about 4.2 oz of alcohol per week. She reports that she does not use illicit drugs.  ALLERGIES:  No Known Allergies  ROS: A comprehensive review of systems was negative.  HOME MEDS: Current Outpatient Prescriptions  Medication Sig Dispense Refill  . amLODipine (NORVASC) 5 MG tablet Take 1 tablet (5 mg total) by mouth daily. 90 tablet 2  . Cholecalciferol (VITAMIN D-3) 5000 UNITS TABS Take 1 tablet by mouth daily.    . clopidogrel (PLAVIX) 75 MG tablet take 1 tablet by mouth once daily 90 tablet 0  . ketorolac (ACULAR) 0.5 % ophthalmic solution Place 1 drop into the right eye 2 (two) times daily.    . LUTEIN PO Take by mouth daily.    . metoprolol succinate (TOPROL-XL) 50 MG 24 hr tablet take 1 tablet by mouth once daily 90 tablet 0  . Probiotic Product (PROBIOTIC PO) Take by mouth daily.    Marland Kitchen triamterene-hydrochlorothiazide (MAXZIDE) 75-50 MG per tablet take 1 tablet by mouth once daily 30 tablet 3  No current facility-administered medications for this visit.    LABS/IMAGING: No results found for this or any previous visit (from the past 48 hour(s)). No results found.  VITALS: BP 132/70 mmHg  Pulse 72  Ht 5' 7.5" (1.715 m)  Wt 126 lb 6.4 oz (57.335 kg)  BMI 19.49 kg/m2  EXAM: General appearance: alert and no distress Neck: no carotid bruit and no JVD Lungs: clear to auscultation bilaterally Heart: regular rate and rhythm, S1, S2 normal, no murmur, click, rub or gallop Abdomen: soft, non-tender; bowel sounds normal; no masses,  no organomegaly Extremities: extremities normal, atraumatic, no cyanosis or edema Pulses: 2+ and symmetric Skin: Skin color,  texture, turgor normal. No rashes or lesions Neurologic: Grossly normal Psych: Pleasant  EKG: Sinus rhythm with PACs, right bundle branch block at 72.  ASSESSMENT: 1. Hypertension 2. History of TIA 3. Right bundle branch block  PLAN: 1.   Mia [name] is doing well and asymptomatic from a cardiac standpoint. Her blood pressure is well-controlled. She is not bothered by palpitations. She did have a nonspecific intraventricular conduction delay in the past however today he appears to have a right bundle-branch pattern. As she is asymptomatic and had a recent stress test which was negative, no further workup is necessary at this time. She presents with no symptoms concerning for conduction disease abnormalities. Plan to see her back annually or sooner as necessary.  [place], [address] [facility] Attending Cardiologist [name] 09/29/2014, 1:03 PM

## 2014-09-29 NOTE — Patient Instructions (Signed)
Your physician wants you to follow-up in: ONE YEAR You will receive a reminder letter in the mail two months in advance. If you don't receive a letter, please call our office to schedule the follow-up appointment.  

## 2014-11-15 ENCOUNTER — Other Ambulatory Visit: Payer: Self-pay | Admitting: Internal Medicine

## 2014-11-29 ENCOUNTER — Other Ambulatory Visit: Payer: Self-pay | Admitting: Internal Medicine

## 2014-11-29 NOTE — Telephone Encounter (Signed)
Rx(s) sent to pharmacy electronically.  

## 2014-12-01 ENCOUNTER — Other Ambulatory Visit: Payer: Self-pay | Admitting: Internal Medicine

## 2014-12-02 NOTE — Telephone Encounter (Signed)
Rx(s) sent to pharmacy electronically.  

## 2015-01-13 ENCOUNTER — Ambulatory Visit (INDEPENDENT_AMBULATORY_CARE_PROVIDER_SITE_OTHER): Payer: Medicare Other | Admitting: Internal Medicine

## 2015-01-13 ENCOUNTER — Encounter: Payer: Self-pay | Admitting: Internal Medicine

## 2015-01-13 VITALS — BP 120/68 | HR 74 | Temp 97.6°F | Ht 68.0 in | Wt 123.0 lb

## 2015-01-13 DIAGNOSIS — R739 Hyperglycemia, unspecified: Secondary | ICD-10-CM | POA: Diagnosis not present

## 2015-01-13 DIAGNOSIS — M1711 Unilateral primary osteoarthritis, right knee: Secondary | ICD-10-CM

## 2015-01-13 DIAGNOSIS — I10 Essential (primary) hypertension: Secondary | ICD-10-CM | POA: Diagnosis not present

## 2015-01-13 DIAGNOSIS — R634 Abnormal weight loss: Secondary | ICD-10-CM | POA: Diagnosis not present

## 2015-01-13 DIAGNOSIS — Z13220 Encounter for screening for lipoid disorders: Secondary | ICD-10-CM

## 2015-01-13 DIAGNOSIS — W19XXXA Unspecified fall, initial encounter: Secondary | ICD-10-CM | POA: Diagnosis not present

## 2015-01-13 NOTE — Progress Notes (Signed)
Patient ID: Mia [name], female   DOB: [birth year], 81 y.o.   MRN: [id number]   Location:  [facility]  Code Status: DNR Goals of Care: Advanced Directive information Does patient have an advance directive?: Yes, Type of Advance Directive: [place], Does patient want to make changes to advanced directive?: No - Patient declined   Chief Complaint  Patient presents with  . Medical Management of Chronic Issues    6 month follow-up, fasting for labs   . Form Completion    Fill out Handicapped Registration     HPI: Patient is a 81 y.o. black female seen in the office today for med mgt of chronic diseases.    She is doing well except she had a fall the other day.  Hit her head and scraped her right elbow.  Felt shaking afterwards for a while.  Is sore.  Had forgotten her cane and had taken her car for a checkup and slipped on the sidewalk.  Bad right knee may have turned during the fall.  Orthopedic surgeon is trying to keep from doing a TKA on the right.  Is using a brace and the knee is rotating in and she is getting a cortisone shot every 6 mos which is helping somewhat.  Knee has definitely worsened since February.  Saw Dr. Debara Pickett for her f/u cardiology visit in May.    Has lost a couple more lbs since he saw her in May.  Sleeps late, has a late breakfast, misses lunch and has a big dinner.   Falls asleep easily, but wakes up to urinate.  Is able to go back to sleep.  Knows she needs more calories.  Has oatmeal with nuts, a salad at lunch and a well balanced dinner--does cook her own food. Does like to eat.  Does occasionally have peanut butter crackers with banana.      Denies depression now.  Had lost her son and had been sad and grieving for a long time.    She is very active.  Does exercise every other day--strength training, cardio, mixture at [facility].  She gets too skinny if she goes daily.  Podiatry and derm come out  to [facility].  Seeing not as clearly as she once was.  Is following with ophtho.  Eye exam was normal the other day.  Thinks she may need new glasses.    Review of Systems:  Review of Systems  Constitutional: Positive for weight loss. Negative for fever, chills and malaise/fatigue.  Eyes: Positive for blurred vision.       Glasses, see hpi  Respiratory: Negative for shortness of breath.   Cardiovascular: Negative for chest pain and leg swelling.  Gastrointestinal: Negative for abdominal pain.  Genitourinary: Negative for dysuria.  Musculoskeletal: Positive for joint pain and falls.  Skin: Negative for rash.  Neurological: Negative for dizziness, loss of consciousness, weakness and headaches.  Psychiatric/Behavioral: Negative for depression and memory loss.    Past Medical History  Diagnosis Date  . Hypertension   . TIA (transient ischemic attack)   . Former smoker     Past Surgical History  Procedure Laterality Date  . Transthoracic echocardiogram  12/2005    mild DUST, EF=>55%; mild mitral annular calcif; mild-mod aortic root calcif  . Nm myocar perf wall motion  01/2008    lexiscan myoview - normal pattern of perfusion in all regions, EF 92%, no significant ischemia demonstated    No  Known Allergies Medications: Patient's Medications  New Prescriptions   No medications on file  Previous Medications   AMLODIPINE (NORVASC) 5 MG TABLET    Take 1 tablet (5 mg total) by mouth daily.   CHOLECALCIFEROL (VITAMIN D-3) 5000 UNITS TABS    Take 1 tablet by mouth daily.   CLOPIDOGREL (PLAVIX) 75 MG TABLET    take 1 tablet by mouth once daily   KETOROLAC (ACULAR) 0.5 % OPHTHALMIC SOLUTION    Place 1 drop into the right eye 2 (two) times daily.   LUTEIN PO    Take by mouth daily.   METOPROLOL SUCCINATE (TOPROL-XL) 50 MG 24 HR TABLET    take 1 tablet by mouth once daily   PROBIOTIC PRODUCT (PROBIOTIC PO)    Take by mouth daily.   TRIAMTERENE-HYDROCHLOROTHIAZIDE (MAXZIDE) 75-50 MG PER TABLET     take 1 tablet by mouth once daily  Modified Medications   No medications on file  Discontinued Medications   No medications on file    Physical Exam: Filed Vitals:   01/13/15 1033  BP: 120/68  Pulse: 74  Temp: 97.6 F (36.4 C)  TempSrc: Oral  Height: 5\' 8"  (1.727 m)  Weight: 123 lb (55.792 kg)  SpO2: 94%  Body mass index is 18.71 kg/(m^2).  Physical Exam  Constitutional: She is oriented to person, place, and time. She appears well-nourished. No distress.  Thin black female, walks with cane  HENT:  Head: Normocephalic and atraumatic.  Eyes:  glasses  Cardiovascular: Normal rate, regular rhythm, normal heart sounds and intact distal pulses.   Pulmonary/Chest: Effort normal and breath sounds normal. No respiratory distress.  Abdominal: Soft. Bowel sounds are normal. She exhibits no distension. There is no tenderness.  Musculoskeletal: Normal range of motion. She exhibits tenderness.  Right knee tender  Neurological: She is alert and oriented to person, place, and time.  Skin: Skin is warm and dry.  Psychiatric: She has a normal mood and affect. Her behavior is normal. Judgment and thought content normal.    Labs reviewed: Basic Metabolic Panel:  Recent Labs  07/18/14 1004  NA 141  K 3.7  CL 100  CO2 24  GLUCOSE 99  BUN 25  CREATININE 1.74*  CALCIUM 10.0   Liver Function Tests:  Recent Labs  07/18/14 1004  AST 21  ALT 12  ALKPHOS 51  BILITOT 0.4  PROT 6.5   No results for input(s): LIPASE, AMYLASE in the last 8760 hours. No results for input(s): AMMONIA in the last 8760 hours. CBC:  Recent Labs  07/18/14 1004  WBC 4.4  NEUTROABS 2.8  HGB 13.4  HCT 40.0  MCV 91  PLT 213   Lipid Panel:  Recent Labs  07/18/14 1004  CHOL 153  HDL 79  LDLCALC 59  TRIG 77  CHOLHDL 1.9   Lab Results  Component Value Date   HGBA1C 6.6* 07/18/2014    Assessment/Plan 1. Fall, initial encounter -feels sore, and seems she injured the right knee -has f/u  with orthopedics already planned -cont use of brace and cane for support -offered PT referral but she did not feel she needed at present  2. Loss of weight - discussed high protein snacks to help maintain weight b/c she does miss her breakfast most days--needs to make up those calories somewhere - Hemoglobin A1c - TSH - Basic metabolic panel - CBC with Differential/Platelet  3. Hyperglycemia -f/u labs to reassess with hba1c  4. Essential hypertension, benign -bp at goal with  current therapy including triamterene hctz, metoprolol succinate and amlodipine and denies dizziness -cont current meds  5. Primary osteoarthritis of right knee -keep f/u with ortho continuing her brace and routine use of her cane -will refer for therapy if she desires  6. Screening, lipid - she is not on statin therapy as lipids are at goal and does not have known CAD, last LDL 59 and HDL 79 - Lipid panel  Labs/tests ordered: Orders Placed This Encounter  Procedures  . Hemoglobin A1c  . Lipid panel    Order Specific Question:  Has the patient fasted?    Answer:  Yes  . TSH  . Basic metabolic panel    Order Specific Question:  Has the patient fasted?    Answer:  Yes  . CBC with Differential/Platelet    Next appt:  4 mos for med mgt and wt loss  Shanquita Ronning L. Rushil Kimbrell, D.O. [facility] [address], [place] [address] Cell Phone (Mon-Fri 8am-5pm):  [phone] On Call:  [phone] & follow prompts after 5pm & weekends Office Phone:  [phone] Office Fax:  [phone]

## 2015-01-14 LAB — CBC WITH DIFFERENTIAL/PLATELET
Basophils Absolute: 0 10*3/uL (ref 0.0–0.2)
Basos: 0 %
EOS (ABSOLUTE): 0.1 10*3/uL (ref 0.0–0.4)
Eos: 1 %
Hematocrit: 42 % (ref 34.0–46.6)
Hemoglobin: 14.1 g/dL (ref 11.1–15.9)
Immature Grans (Abs): 0 10*3/uL (ref 0.0–0.1)
Immature Granulocytes: 0 %
Lymphocytes Absolute: 0.9 10*3/uL (ref 0.7–3.1)
Lymphs: 14 %
MCH: 30.5 pg (ref 26.6–33.0)
MCHC: 33.6 g/dL (ref 31.5–35.7)
MCV: 91 fL (ref 79–97)
Monocytes Absolute: 0.4 10*3/uL (ref 0.1–0.9)
Monocytes: 7 %
Neutrophils Absolute: 5 10*3/uL (ref 1.4–7.0)
Neutrophils: 78 %
Platelets: 249 10*3/uL (ref 150–379)
RBC: 4.63 x10E6/uL (ref 3.77–5.28)
RDW: 15.1 % (ref 12.3–15.4)
WBC: 6.4 10*3/uL (ref 3.4–10.8)

## 2015-01-14 LAB — LIPID PANEL
Chol/HDL Ratio: 2 ratio units (ref 0.0–4.4)
Cholesterol, Total: 163 mg/dL (ref 100–199)
HDL: 81 mg/dL (ref >39)
LDL Calculated: 59 mg/dL (ref 0–99)
Triglycerides: 115 mg/dL (ref 0–149)
VLDL Cholesterol Cal: 23 mg/dL (ref 5–40)

## 2015-01-14 LAB — BASIC METABOLIC PANEL
BUN/Creatinine Ratio: 17 (ref 11–26)
BUN: 29 mg/dL — ABNORMAL HIGH (ref 8–27)
CO2: 25 mmol/L (ref 18–29)
Calcium: 10.3 mg/dL (ref 8.7–10.3)
Chloride: 99 mmol/L (ref 97–108)
Creatinine, Ser: 1.73 mg/dL — ABNORMAL HIGH (ref 0.57–1.00)
GFR calc Af Amer: 30 mL/min/{1.73_m2} — ABNORMAL LOW (ref >59)
GFR calc non Af Amer: 26 mL/min/{1.73_m2} — ABNORMAL LOW (ref >59)
Glucose: 113 mg/dL — ABNORMAL HIGH (ref 65–99)
Potassium: 3.8 mmol/L (ref 3.5–5.2)
Sodium: 142 mmol/L (ref 134–144)

## 2015-01-14 LAB — TSH: TSH: 2.08 u[IU]/mL (ref 0.450–4.500)

## 2015-01-14 LAB — HEMOGLOBIN A1C
Est. average glucose Bld gHb Est-mCnc: 146 mg/dL
Hgb A1c MFr Bld: 6.7 % — ABNORMAL HIGH (ref 4.8–5.6)

## 2015-02-27 ENCOUNTER — Encounter: Payer: Self-pay | Admitting: Internal Medicine

## 2015-02-27 ENCOUNTER — Ambulatory Visit (INDEPENDENT_AMBULATORY_CARE_PROVIDER_SITE_OTHER): Payer: Medicare Other | Admitting: Internal Medicine

## 2015-02-27 VITALS — BP 116/70 | HR 80 | Temp 97.6°F | Resp 14 | Ht 68.0 in | Wt 119.0 lb

## 2015-02-27 DIAGNOSIS — M10071 Idiopathic gout, right ankle and foot: Secondary | ICD-10-CM | POA: Diagnosis not present

## 2015-02-27 DIAGNOSIS — R638 Other symptoms and signs concerning food and fluid intake: Secondary | ICD-10-CM

## 2015-02-27 DIAGNOSIS — R490 Dysphonia: Secondary | ICD-10-CM | POA: Diagnosis not present

## 2015-02-27 DIAGNOSIS — R5383 Other fatigue: Secondary | ICD-10-CM | POA: Diagnosis not present

## 2015-02-27 DIAGNOSIS — R634 Abnormal weight loss: Secondary | ICD-10-CM

## 2015-02-27 DIAGNOSIS — R5381 Other malaise: Secondary | ICD-10-CM | POA: Diagnosis not present

## 2015-02-27 MED ORDER — PREDNISONE 10 MG PO TABS: ORAL_TABLET | ORAL | Status: DC

## 2015-02-27 NOTE — Progress Notes (Signed)
Patient ID: Mia [name], female   DOB: [birth year], 81 y.o.   MRN: [id number]   Location:  [facility]  Goals of Care: Advanced Directive information Does patient have an advance directive?: Yes, Type of Advance Directive: [place], Does patient want to make changes to advanced directive?: No - Patient declined   Chief Complaint  Patient presents with  . Acute Visit    Possible gout and neuropathy. Patient c/o right foot pain x 1 week (off/on). Fasting if any labs due    HPI: Patient is a 81 y.o. black female seen in the office today for an acute visit with right foot pain for a couple of weeks.  She is wondering if she has gout or neuropathy.  No injury.   Also notes hoarseness x 2 wks.  Didn't notice at first, but then felt like voice was different.  No indigestion, no congestion.  Down 4 lbs and is trying to gain weight.  Feels like something is in there and she wants to clear her throat all of the time.  Clearing it does temporarily help.  Review of Systems:  Review of Systems  Constitutional: Positive for weight loss and malaise/fatigue. Negative for fever and chills.  HENT: Negative for congestion and sore throat.        Hoarseness, globus sensation  Eyes: Negative for blurred vision.  Respiratory: Negative for cough, sputum production, shortness of breath and stridor.   Cardiovascular: Negative for chest pain.  Gastrointestinal: Negative for abdominal pain, blood in stool and melena.  Genitourinary: Negative for dysuria.  Musculoskeletal: Negative for falls.       Unsteady gait; redness, warmth, swelling of right great toe and medial foot with difficulty walking--using cane  Skin: Negative for rash.  Neurological: Negative for dizziness.  Psychiatric/Behavioral: Negative for memory loss.    Past Medical History  Diagnosis Date  . Hypertension   . TIA (transient ischemic attack)   . Former smoker     Past  Surgical History  Procedure Laterality Date  . Transthoracic echocardiogram  12/2005    mild DUST, EF=>55%; mild mitral annular calcif; mild-mod aortic root calcif  . Nm myocar perf wall motion  01/2008    lexiscan myoview - normal pattern of perfusion in all regions, EF 92%, no significant ischemia demonstated    No Known Allergies Medications: Patient's Medications  New Prescriptions   No medications on file  Previous Medications   AMLODIPINE (NORVASC) 5 MG TABLET    Take 1 tablet (5 mg total) by mouth daily.   CHOLECALCIFEROL (VITAMIN D) 1000 UNITS TABLET    Take 1,000 Units by mouth daily.   CLOPIDOGREL (PLAVIX) 75 MG TABLET    take 1 tablet by mouth once daily   KETOROLAC (ACULAR) 0.5 % OPHTHALMIC SOLUTION    Place 1 drop into the right eye 2 (two) times daily.   LUTEIN PO    Take by mouth daily.   METOPROLOL SUCCINATE (TOPROL-XL) 50 MG 24 HR TABLET    take 1 tablet by mouth once daily   PROBIOTIC PRODUCT (PROBIOTIC PO)    Take by mouth daily.   TRIAMTERENE-HYDROCHLOROTHIAZIDE (MAXZIDE) 75-50 MG PER TABLET    take 1 tablet by mouth once daily  Modified Medications   No medications on file  Discontinued Medications   CHOLECALCIFEROL (VITAMIN D-3) 5000 UNITS TABS    Take 1 tablet by mouth daily.    Physical Exam: Filed Vitals:   02/27/15  1117  BP: 116/70  Pulse: 80  Temp: 97.6 F (36.4 C)  TempSrc: Oral  Resp: 14  Height: 5\' 8"  (1.727 m)  Weight: 119 lb (53.978 kg)   Physical Exam  Constitutional: She is oriented to person, place, and time. No distress.  Increasingly frail black female  Neck: Neck supple.  Cardiovascular: Normal rate, regular rhythm, normal heart sounds and intact distal pulses.   Pulmonary/Chest: Effort normal and breath sounds normal.  Abdominal: Soft. Bowel sounds are normal.  Musculoskeletal: She exhibits tenderness.  Right medial foot and great toe with some mild swelling and increased warmth; gait more unsteady and not bearing weight well on  right foot  Lymphadenopathy:    She has cervical adenopathy.  Neurological: She is alert and oriented to person, place, and time.  Skin: Skin is warm and dry.  Psychiatric: She has a normal mood and affect.    Labs reviewed: Basic Metabolic Panel:  Recent Labs  07/18/14 1004 01/13/15 1130  NA 141 142  K 3.7 3.8  CL 100 99  CO2 24 25  GLUCOSE 99 113*  BUN 25 29*  CREATININE 1.74* 1.73*  CALCIUM 10.0 10.3  TSH  --  2.080   Liver Function Tests:  Recent Labs  07/18/14 1004  AST 21  ALT 12  ALKPHOS 51  BILITOT 0.4  PROT 6.5  ALBUMIN 4.2   No results for input(s): LIPASE, AMYLASE in the last 8760 hours. No results for input(s): AMMONIA in the last 8760 hours. CBC:  Recent Labs  07/18/14 1004 01/13/15 1130  WBC 4.4 6.4  NEUTROABS 2.8 5.0  HGB 13.4  --   HCT 40.0 42.0  MCV 91  --   PLT 213  --    Lipid Panel:  Recent Labs  07/18/14 1004 01/13/15 1130  CHOL 153 163  HDL 79 81  LDLCALC 59 59  TRIG 77 115  CHOLHDL 1.9 2.0   Lab Results  Component Value Date   HGBA1C 6.7* 01/13/2015   Assessment/Plan 1. Acute idiopathic gout of right foot - involving great toe and medial aspect of her foot - cannot take colchicine or indomethacin with CKD so will use prednisone which may also help her appetite, weight temporarily and hoarseness depending on etiology - Uric Acid - predniSONE (DELTASONE) 10 MG tablet; Take 6 tabs for 1 day, 5 tabs for 1 day, 4 tabs for 1 day, 3 tabs for 1 day, 2 tabs for 1 day, 1 tab for 1 day, then stop  Dispense: 21 tablet; Refill: 0  2. Loss of weight -ongoing, this is very concerning to me -she admits she does not eat as much, but says she does have an appetite -f/u labs: - CBC with Differential/Platelet - Comprehensive metabolic panel - Prealbumin  3. Hoarseness of voice - etiology unclear - seems to have some mild adenopathy -could be allergic, but has h/o tobacco abuse in the past - if this does not improve in another  two weeks, would refer to ENT for evaluation  4. Decreased oral intake -ongoing, given some supplement samples today--she plans to try to get more intake  -has reduced her exercise due to her weight loss, but this has not leveled off - Comprehensive metabolic panel - Prealbumin  5. Malaise and fatigue -worse for past couple of weeks -etiology unclear, but worrisome in view of weight loss, new hoarseness and h/o prior tobacco abuse  Labs/tests ordered: Orders Placed This Encounter  Procedures  . CBC with Differential/Platelet  .  Comprehensive metabolic panel  . Uric Acid  . Prealbumin    Next appt:  Keep as scheduled and return sooner if hoarseness does not resolve and she's not gaining weight back  Sherel Fennell L. Ronalee Scheunemann, D.O. [facility] [address], [place] [address] Cell Phone (Mon-Fri 8am-5pm):  [phone] On Call:  [phone] & follow prompts after 5pm & weekends Office Phone:  [phone] Office Fax:  [phone]

## 2015-02-28 ENCOUNTER — Telehealth: Payer: Self-pay

## 2015-02-28 LAB — COMPREHENSIVE METABOLIC PANEL
ALT: 13 IU/L (ref 0–32)
AST: 21 IU/L (ref 0–40)
Albumin/Globulin Ratio: 1.8 (ref 1.1–2.5)
Albumin: 4.3 g/dL (ref 3.5–4.7)
Alkaline Phosphatase: 56 IU/L (ref 39–117)
BUN/Creatinine Ratio: 22 (ref 11–26)
BUN: 39 mg/dL — ABNORMAL HIGH (ref 8–27)
Bilirubin Total: 0.4 mg/dL (ref 0.0–1.2)
CO2: 23 mmol/L (ref 18–29)
Calcium: 10.6 mg/dL — ABNORMAL HIGH (ref 8.7–10.3)
Chloride: 97 mmol/L (ref 97–108)
Creatinine, Ser: 1.79 mg/dL — ABNORMAL HIGH (ref 0.57–1.00)
GFR calc Af Amer: 29 mL/min/{1.73_m2} — ABNORMAL LOW (ref >59)
GFR calc non Af Amer: 25 mL/min/{1.73_m2} — ABNORMAL LOW (ref >59)
Globulin, Total: 2.4 g/dL (ref 1.5–4.5)
Glucose: 104 mg/dL — ABNORMAL HIGH (ref 65–99)
Potassium: 4.2 mmol/L (ref 3.5–5.2)
Sodium: 140 mmol/L (ref 134–144)
Total Protein: 6.7 g/dL (ref 6.0–8.5)

## 2015-02-28 LAB — CBC WITH DIFFERENTIAL/PLATELET
Basophils Absolute: 0 10*3/uL (ref 0.0–0.2)
Basos: 0 %
EOS (ABSOLUTE): 0.1 10*3/uL (ref 0.0–0.4)
Eos: 1 %
Hematocrit: 41 % (ref 34.0–46.6)
Hemoglobin: 13.5 g/dL (ref 11.1–15.9)
Immature Grans (Abs): 0 10*3/uL (ref 0.0–0.1)
Immature Granulocytes: 0 %
Lymphocytes Absolute: 0.9 10*3/uL (ref 0.7–3.1)
Lymphs: 11 %
MCH: 30.3 pg (ref 26.6–33.0)
MCHC: 32.9 g/dL (ref 31.5–35.7)
MCV: 92 fL (ref 79–97)
Monocytes Absolute: 0.4 10*3/uL (ref 0.1–0.9)
Monocytes: 5 %
Neutrophils Absolute: 6.9 10*3/uL (ref 1.4–7.0)
Neutrophils: 83 %
Platelets: 340 10*3/uL (ref 150–379)
RBC: 4.45 x10E6/uL (ref 3.77–5.28)
RDW: 14.8 % (ref 12.3–15.4)
WBC: 8.3 10*3/uL (ref 3.4–10.8)

## 2015-02-28 LAB — PREALBUMIN: PREALBUMIN: 19 mg/dL (ref 9–32)

## 2015-02-28 LAB — URIC ACID: Uric Acid: 10 mg/dL — ABNORMAL HIGH (ref 2.5–7.1)

## 2015-02-28 MED ORDER — ALLOPURINOL 100 MG PO TABS: 100.0000 mg | ORAL_TABLET | Freq: Every day | ORAL | Status: DC

## 2015-02-28 NOTE — Telephone Encounter (Signed)
Left message on voicemail for patient to return call when available   

## 2015-02-28 NOTE — Telephone Encounter (Signed)
-----   Message from Gayland Curry, DO sent at 02/28/2015  9:31 AM EDT ----- Uric acid level was 10 when goal is 6.  Cont prednisone taper ordered yesterday.  Also please place an order for her for allopurinol 100mg  po daily--this is a chronic medication to prevent gout flares by reducing the uric acid level in her bloodstream.  We will recheck her uric acid level at her next appointment to see if it reaches the goal of <6.  Her kidney function is about the same.  Encourage hydration with water--at least 6 8oz glasses per day.  Prealbumin surprisingly is within the normal range.

## 2015-02-28 NOTE — Telephone Encounter (Signed)
Discussed with patient, patient verbalized understanding of results. Patient states she will give allopurinol another try, in the past it just didn't really agree with her. Patient would like 30 day supply first and if tolerated she will call for 90 day supply

## 2015-05-08 ENCOUNTER — Encounter: Payer: Self-pay | Admitting: Internal Medicine

## 2015-05-08 ENCOUNTER — Ambulatory Visit (INDEPENDENT_AMBULATORY_CARE_PROVIDER_SITE_OTHER): Payer: Medicare Other | Admitting: Internal Medicine

## 2015-05-08 VITALS — BP 128/84 | HR 68 | Temp 97.5°F | Ht 68.0 in | Wt 120.0 lb

## 2015-05-08 DIAGNOSIS — I10 Essential (primary) hypertension: Secondary | ICD-10-CM | POA: Diagnosis not present

## 2015-05-08 DIAGNOSIS — R490 Dysphonia: Secondary | ICD-10-CM | POA: Diagnosis not present

## 2015-05-08 DIAGNOSIS — Z23 Encounter for immunization: Secondary | ICD-10-CM | POA: Diagnosis not present

## 2015-05-08 DIAGNOSIS — Z8673 Personal history of transient ischemic attack (TIA), and cerebral infarction without residual deficits: Secondary | ICD-10-CM | POA: Diagnosis not present

## 2015-05-08 DIAGNOSIS — M109 Gout, unspecified: Secondary | ICD-10-CM

## 2015-05-08 DIAGNOSIS — R634 Abnormal weight loss: Secondary | ICD-10-CM | POA: Diagnosis not present

## 2015-05-08 DIAGNOSIS — F430 Acute stress reaction: Secondary | ICD-10-CM | POA: Diagnosis not present

## 2015-05-08 DIAGNOSIS — M10079 Idiopathic gout, unspecified ankle and foot: Secondary | ICD-10-CM

## 2015-05-08 MED ORDER — TETANUS-DIPHTH-ACELL PERTUSSIS 5-2.5-18.5 LF-MCG/0.5 IM SUSP: 0.5000 mL | Freq: Once | INTRAMUSCULAR | Status: DC

## 2015-05-08 NOTE — Progress Notes (Signed)
Patient ID: Mia [name], female   DOB: [birth year], 81 y.o.   MRN: [id number]   Location: [facility] Provider: Rexene Edison. Mariea Clonts, D.O., C.M.D.  Code Status: DNR Goals of Care: Advanced Directive information Does patient have an advance directive?: Yes, Type of Advance Directive: [place];Living will  Chief Complaint  Patient presents with  . Medical Management of Chronic Issues    4 month check on blood pressure, hyperglycermia, weight loss    HPI: Patient is a 81 y.o. female seen in the office today for med mgt of her chronic diseases.  No new things.   Started allopurinol for preventive for gout.  Had not taken.   Had a period of wanting to sleep and drink cold water after she finished that. Said she wants to just deal with the episodic gout.   She has gained one lb back today.  Is eating more calories now.  Stress in family has been bothersome.  Sleeping ok.  Does wake up to use the bathroom, and does fall back to sleep.   Sister passed last year, son passed suddenly in April and his wife has leukemia.  Pt's daughter has PTSD and has isolated herself.  Realized these things are out of her control.   Lives in a town home--neighbors are supportive.  Had a memorial service there for her son.  Many good friends and church support, too.      BP at goal with norvasc and toprol and maxzide.  No dizziness or lightheadedness.  No more falls.  She is very careful to use her cane.  Says many people fall where she is.    Sees orthopedics for a steroid injection q 6 mos on her right knee.  Is  It is overdue at present.    Review of Systems:  Review of Systems  Constitutional: Positive for malaise/fatigue. Negative for fever, chills and weight loss.  HENT: Negative for congestion and sore throat.        Hoarseness resolved  Eyes: Negative for blurred vision.       Glasses  Respiratory: Negative for shortness of breath and stridor.   Cardiovascular: Negative for  chest pain and leg swelling.  Gastrointestinal: Negative for abdominal pain, constipation, blood in stool and melena.  Genitourinary: Positive for frequency. Negative for dysuria.       Nocturia   Musculoskeletal: Negative for falls.  Skin: Negative for rash.  Neurological: Positive for weakness. Negative for dizziness.  Endo/Heme/Allergies: Does not bruise/bleed easily.  Psychiatric/Behavioral: Positive for depression and memory loss. The patient is not nervous/anxious and does not have insomnia.     Past Medical History  Diagnosis Date  . Hypertension   . TIA (transient ischemic attack)   . Former smoker     Past Surgical History  Procedure Laterality Date  . Transthoracic echocardiogram  12/2005    mild DUST, EF=>55%; mild mitral annular calcif; mild-mod aortic root calcif  . Nm myocar perf wall motion  01/2008    lexiscan myoview - normal pattern of perfusion in all regions, EF 92%, no significant ischemia demonstated    No Known Allergies    Medication List       This list is accurate as of: 05/08/15  8:52 AM.  Always use your most recent med list.               amLODipine 5 MG tablet  Commonly known as:  NORVASC  Take 1 tablet (5 mg total) by  mouth daily.     cholecalciferol 1000 UNITS tablet  Commonly known as:  VITAMIN D  Take 1,000 Units by mouth daily.     clopidogrel 75 MG tablet  Commonly known as:  PLAVIX  take 1 tablet by mouth once daily     ketorolac 0.5 % ophthalmic solution  Commonly known as:  ACULAR  Place 1 drop into the right eye 2 (two) times daily.     LUTEIN PO  Take by mouth daily.     metoprolol succinate 50 MG 24 hr tablet  Commonly known as:  TOPROL-XL  take 1 tablet by mouth once daily     PROBIOTIC PO  Take by mouth daily.     triamterene-hydrochlorothiazide 75-50 MG tablet  Commonly known as:  MAXZIDE  take 1 tablet by mouth once daily        Health Maintenance  Topic Date Due  . DEXA SCAN  09/25/1992  .  TETANUS/TDAP  05/20/2018 (Originally 09/26/1944)  . INFLUENZA VACCINE  12/19/2015  . ZOSTAVAX  Completed  . PNA vac Low Risk Adult  Completed    Physical Exam: Filed Vitals:   05/08/15 0841  BP: 128/84  Pulse: 68  Temp: 97.5 F (36.4 C)  TempSrc: Oral  Height: 5\' 8"  (1.727 m)  Weight: 120 lb (54.432 kg)  SpO2: 99%   Body mass index is 18.25 kg/(m^2). Physical Exam  Constitutional: She is oriented to person, place, and time. No distress.  Thin black female  HENT:  Head: Normocephalic and atraumatic.  Cardiovascular: Normal rate, regular rhythm, normal heart sounds and intact distal pulses.   Pulmonary/Chest: Effort normal and breath sounds normal. No respiratory distress.  Abdominal: Bowel sounds are normal.  Musculoskeletal: Normal range of motion.  Short shuffling gait, stooped posture, using cane, no tremor  Neurological: She is alert and oriented to person, place, and time.  Skin: Skin is warm and dry.  Psychiatric:  Somewhat flat affect    Labs reviewed: Basic Metabolic Panel:  Recent Labs  07/18/14 1004 01/13/15 1130 02/27/15 1150  NA 141 142 140  K 3.7 3.8 4.2  CL 100 99 97  CO2 24 25 23   GLUCOSE 99 113* 104*  BUN 25 29* 39*  CREATININE 1.74* 1.73* 1.79*  CALCIUM 10.0 10.3 10.6*  TSH  --  2.080  --    Liver Function Tests:  Recent Labs  07/18/14 1004 02/27/15 1150  AST 21 21  ALT 12 13  ALKPHOS 51 56  BILITOT 0.4 0.4  PROT 6.5 6.7  ALBUMIN 4.2 4.3   No results for input(s): LIPASE, AMYLASE in the last 8760 hours. No results for input(s): AMMONIA in the last 8760 hours. CBC:  Recent Labs  07/18/14 1004 01/13/15 1130 02/27/15 1150  WBC 4.4 6.4 8.3  NEUTROABS 2.8 5.0 6.9  HGB 13.4  --   --   HCT 40.0 42.0 41.0  MCV 91  --   --   PLT 213  --   --    Lipid Panel:  Recent Labs  07/18/14 1004 01/13/15 1130  CHOL 153 163  HDL 79 81  LDLCALC 59 59  TRIG 77 115  CHOLHDL 1.9 2.0   Lab Results  Component Value Date   HGBA1C 6.7*  01/13/2015   Assessment/Plan 1. Loss of weight -weight has stabilized -she now admits that this was related to grief and stress and has improved - CBC with Differential/Platelet - Comprehensive metabolic panel -cont increased calories and supplemental beverages  2. Hoarseness of voice -resolved -etiology was never discovered  3. Gout of foot, unspecified cause, unspecified chronicity, unspecified laterality -she has opted not to take allopurinol b/c she "does not feel well on it"  4. Essential hypertension, benign -bp well controlled with her three agents, not dizzy, cont same - Comprehensive metabolic panel  5. Acute stress reaction - due to loss of her son, sister and illness in daughter in law, PTSD in daughter -does not want medications to help with the depression portion at this time, but will let me know if she changes her mind -suspect this affected her memory, as well  6. H/O TIA (transient ischemic attack) and stroke - will f/u hba1c and lipid to reassess and ensure at goal for prevention of further ischemic events -cont plavix - CBC with Differential/Platelet - Comprehensive metabolic panel - Hemoglobin A1c - Lipid panel  7. Need for Tdap vaccination - Tdap ([place]) 5-2.5-18.5 LF-MCG/0.5 injection; Inject 0.5 mLs into the muscle once.  Dispense: 0.5 mL; Refill: 0 RX given to get at the pharmacy  Labs/tests ordered:   Orders Placed This Encounter  Procedures  . CBC with Differential/Platelet  . Comprehensive metabolic panel    Order Specific Question:  Has the patient fasted?    Answer:  Yes  . Hemoglobin A1c  . Lipid panel    Order Specific Question:  Has the patient fasted?    Answer:  Yes    Next appt:  4 mos for med mgt  Goran Olden L. Sean Malinowski, D.O. [facility] [address], [place] [address] Cell Phone (Mon-Fri 8am-5pm):  [phone] On Call:  [phone] & follow prompts after 5pm &  weekends Office Phone:  [phone] Office Fax:  [phone]        +

## 2015-05-09 LAB — CBC WITH DIFFERENTIAL/PLATELET
Basophils Absolute: 0 10*3/uL (ref 0.0–0.2)
Basos: 1 %
EOS (ABSOLUTE): 0.1 10*3/uL (ref 0.0–0.4)
Eos: 2 %
Hematocrit: 40.8 % (ref 34.0–46.6)
Hemoglobin: 13.4 g/dL (ref 11.1–15.9)
Immature Grans (Abs): 0 10*3/uL (ref 0.0–0.1)
Immature Granulocytes: 0 %
Lymphocytes Absolute: 0.7 10*3/uL (ref 0.7–3.1)
Lymphs: 15 %
MCH: 30.2 pg (ref 26.6–33.0)
MCHC: 32.8 g/dL (ref 31.5–35.7)
MCV: 92 fL (ref 79–97)
Monocytes Absolute: 0.5 10*3/uL (ref 0.1–0.9)
Monocytes: 9 %
Neutrophils Absolute: 3.7 10*3/uL (ref 1.4–7.0)
Neutrophils: 73 %
Platelets: 243 10*3/uL (ref 150–379)
RBC: 4.44 x10E6/uL (ref 3.77–5.28)
RDW: 15.9 % — ABNORMAL HIGH (ref 12.3–15.4)
WBC: 5 10*3/uL (ref 3.4–10.8)

## 2015-05-09 LAB — LIPID PANEL
Chol/HDL Ratio: 2 ratio units (ref 0.0–4.4)
Cholesterol, Total: 156 mg/dL (ref 100–199)
HDL: 78 mg/dL (ref >39)
LDL Calculated: 59 mg/dL (ref 0–99)
Triglycerides: 97 mg/dL (ref 0–149)
VLDL Cholesterol Cal: 19 mg/dL (ref 5–40)

## 2015-05-09 LAB — COMPREHENSIVE METABOLIC PANEL
ALT: 13 IU/L (ref 0–32)
AST: 22 IU/L (ref 0–40)
Albumin/Globulin Ratio: 2.1 (ref 1.1–2.5)
Albumin: 4.1 g/dL (ref 3.5–4.7)
Alkaline Phosphatase: 55 IU/L (ref 39–117)
BUN/Creatinine Ratio: 16 (ref 11–26)
BUN: 24 mg/dL (ref 8–27)
Bilirubin Total: 0.4 mg/dL (ref 0.0–1.2)
CO2: 25 mmol/L (ref 18–29)
Calcium: 10.3 mg/dL (ref 8.7–10.3)
Chloride: 102 mmol/L (ref 96–106)
Creatinine, Ser: 1.54 mg/dL — ABNORMAL HIGH (ref 0.57–1.00)
GFR calc Af Amer: 35 mL/min/{1.73_m2} — ABNORMAL LOW (ref >59)
GFR calc non Af Amer: 30 mL/min/{1.73_m2} — ABNORMAL LOW (ref >59)
Globulin, Total: 2 g/dL (ref 1.5–4.5)
Glucose: 106 mg/dL — ABNORMAL HIGH (ref 65–99)
Potassium: 3.8 mmol/L (ref 3.5–5.2)
Sodium: 143 mmol/L (ref 134–144)
Total Protein: 6.1 g/dL (ref 6.0–8.5)

## 2015-05-09 LAB — HEMOGLOBIN A1C
Est. average glucose Bld gHb Est-mCnc: 131 mg/dL
Hgb A1c MFr Bld: 6.2 % — ABNORMAL HIGH (ref 4.8–5.6)

## 2015-07-05 LAB — HM DIABETES EYE EXAM

## 2015-07-11 ENCOUNTER — Encounter: Payer: Self-pay | Admitting: *Deleted

## 2015-08-03 ENCOUNTER — Other Ambulatory Visit: Payer: Self-pay | Admitting: Internal Medicine

## 2015-08-03 NOTE — Telephone Encounter (Signed)
REFILL 

## 2015-08-30 ENCOUNTER — Telehealth: Payer: Self-pay | Admitting: Internal Medicine

## 2015-08-30 NOTE — Telephone Encounter (Signed)
1. Type of surgery: right total knee arthoplasty 2. Date of surgery: to be scheduled 3. Surgeon: Dr. Zollie Beckers 4. Medications that need to be held & how long: none specified - takes plavix 5. Fax and/or Phone: (p)  [phone]  (f) [phone]  Dondra Spry)  Message routed to MD Last OV 09/2014 OV scheduled for 09/06/2015 - rescheduled from May as patient wants to get clearance

## 2015-08-31 NOTE — Telephone Encounter (Signed)
Yes ... we will discuss then.  Dr. Lemmie Evens

## 2015-09-04 ENCOUNTER — Encounter: Payer: Self-pay | Admitting: Internal Medicine

## 2015-09-04 ENCOUNTER — Ambulatory Visit (INDEPENDENT_AMBULATORY_CARE_PROVIDER_SITE_OTHER): Payer: Medicare Other | Admitting: Internal Medicine

## 2015-09-04 VITALS — BP 110/70 | HR 67 | Temp 97.5°F | Ht 68.0 in | Wt 119.0 lb

## 2015-09-04 DIAGNOSIS — R739 Hyperglycemia, unspecified: Secondary | ICD-10-CM

## 2015-09-04 DIAGNOSIS — M1711 Unilateral primary osteoarthritis, right knee: Secondary | ICD-10-CM

## 2015-09-04 DIAGNOSIS — G3184 Mild cognitive impairment, so stated: Secondary | ICD-10-CM | POA: Insufficient documentation

## 2015-09-04 DIAGNOSIS — I10 Essential (primary) hypertension: Secondary | ICD-10-CM | POA: Diagnosis not present

## 2015-09-04 DIAGNOSIS — R634 Abnormal weight loss: Secondary | ICD-10-CM | POA: Insufficient documentation

## 2015-09-04 DIAGNOSIS — E538 Deficiency of other specified B group vitamins: Secondary | ICD-10-CM | POA: Diagnosis not present

## 2015-09-04 NOTE — Progress Notes (Signed)
Patient ID: Mia [name], female   DOB: November 10, 1925, 82 y.o.   MRN: [id number]   Location:  [facility] Provider:  Lianne Carreto L. Mariea Clonts, D.O., C.M.D.  Code Status:  Goals of Care:  Advanced Directives 09/04/2015  Does patient have an advance directive? Yes  Type of Advance Directive [place]  Does patient want to make changes to advanced directive? -  Copy of advanced directive(s) in chart? Yes     Chief Complaint  Patient presents with  . Medical Management of Chronic Issues    4 mth follow-up    HPI: Patient is a 82 y.o. female seen today for medical management of chronic diseases.    Needs cardiac clearance for right knee TKA.  Has that on Wednesday.  Is unsteady, pain is increasing and steroid shots are no longer effective.  Says she feels well otherwise.  Dr. Rush Farmer is her orthopedic surgeon.  There are plans to go to [facility] for rehab.   Discussed need for bowel regimen postop and risk of delirium she may have postop.  There are thoughts about spinal anesthesia rather than general for her which is probably a good idea.    Mild cognitive impairment:  No changes she notes.    HTN:  bp was great this am.  Some dizziness when she goes out in the am and the sun comes into her yes.  Not on standing.    B12 deficiency:  Some neuropathy.  Has decreased her wine intake which has helped the neuropathy.  Takes B12 sublingually.    Hyperglycemia:  Needs f/u hba1c.  Is still exercising.  Lost 1 more lb since Dec.  Needs supplement w/o lactose or sugar.    Spirits have overall been better.  This past week has been rough b/c it's the anniversary of her son passing away.    Bone density was ordered in Feb but not done.  Discussed doing this later on after she is through her knee surgery.    Past Medical History  Diagnosis Date  . Hypertension   . TIA (transient ischemic attack)   . Former smoker     Past Surgical History  Procedure Laterality Date  .  Transthoracic echocardiogram  12/2005    mild DUST, EF=>55%; mild mitral annular calcif; mild-mod aortic root calcif  . Nm myocar perf wall motion  01/2008    lexiscan myoview - normal pattern of perfusion in all regions, EF 92%, no significant ischemia demonstated    No Known Allergies    Medication List       This list is accurate as of: 09/04/15  8:28 AM.  Always use your most recent med list.               amLODipine 5 MG tablet  Commonly known as:  NORVASC  take 1 tablet by mouth once daily     cholecalciferol 1000 units tablet  Commonly known as:  VITAMIN D  Take 1,000 Units by mouth daily.     clopidogrel 75 MG tablet  Commonly known as:  PLAVIX  take 1 tablet by mouth once daily     ketorolac 0.5 % ophthalmic solution  Commonly known as:  ACULAR  Place 1 drop into the right eye 2 (two) times daily.     LUTEIN PO  Take by mouth daily.     metoprolol succinate 50 MG 24 hr tablet  Commonly known as:  TOPROL-XL  take 1 tablet by mouth once daily  PROBIOTIC PO  Take by mouth daily.     triamterene-hydrochlorothiazide 75-50 MG tablet  Commonly known as:  MAXZIDE  take 1 tablet by mouth once daily        Review of Systems:  Review of Systems  Constitutional: Positive for weight loss and malaise/fatigue. Negative for fever and chills.  HENT: Negative for congestion.   Eyes:       Glasses  Respiratory: Negative for shortness of breath.   Cardiovascular: Negative for chest pain and leg swelling.  Gastrointestinal: Negative for abdominal pain and constipation.  Genitourinary: Negative for dysuria.  Musculoskeletal: Positive for joint pain. Negative for falls.       Right knee, near falls, giving out; walks with cane  Skin: Negative for itching and rash.  Neurological: Positive for dizziness. Negative for loss of consciousness and weakness.  Psychiatric/Behavioral: Positive for memory loss. Negative for depression and suicidal ideas.    Health  Maintenance  Topic Date Due  . DEXA SCAN  09/25/1992  . INFLUENZA VACCINE  12/19/2015  . TETANUS/TDAP  07/08/2025  . ZOSTAVAX  Completed  . PNA vac Low Risk Adult  Completed    Physical Exam: Filed Vitals:   09/04/15 0819  BP: 110/70  Pulse: 67  Temp: 97.5 F (36.4 C)  TempSrc: Oral  Height: 5\' 8"  (1.727 m)  Weight: 119 lb (53.978 kg)   Body mass index is 18.1 kg/(m^2). Physical Exam  Constitutional: She is oriented to person, place, and time. No distress.  Thin tall black female  Cardiovascular: Normal rate, regular rhythm, normal heart sounds and intact distal pulses.   Pulmonary/Chest: Effort normal and breath sounds normal. No respiratory distress.  Abdominal: Bowel sounds are normal.  Musculoskeletal:  Slow gait, small steps, using cane for support; right knee slightly warm, crepitus present  Neurological: She is alert and oriented to person, place, and time.  Skin: Skin is warm and dry.  Psychiatric: She has a normal mood and affect.    Labs reviewed: Basic Metabolic Panel:  Recent Labs  01/13/15 1130 02/27/15 1150 05/08/15 0910  NA 142 140 143  K 3.8 4.2 3.8  CL 99 97 102  CO2 25 23 25   GLUCOSE 113* 104* 106*  BUN 29* 39* 24  CREATININE 1.73* 1.79* 1.54*  CALCIUM 10.3 10.6* 10.3  TSH 2.080  --   --    Liver Function Tests:  Recent Labs  02/27/15 1150 05/08/15 0910  AST 21 22  ALT 13 13  ALKPHOS 56 55  BILITOT 0.4 0.4  PROT 6.7 6.1  ALBUMIN 4.3 4.1   No results for input(s): LIPASE, AMYLASE in the last 8760 hours. No results for input(s): AMMONIA in the last 8760 hours. CBC:  Recent Labs  01/13/15 1130 02/27/15 1150 05/08/15 0910  WBC 6.4 8.3 5.0  NEUTROABS 5.0 6.9 3.7  HCT 42.0 41.0 40.8  MCV 91 92 92  PLT 249 340 243   Lipid Panel:  Recent Labs  01/13/15 1130 05/08/15 0910  CHOL 163 156  HDL 81 78  LDLCALC 59 59  TRIG 115 97  CHOLHDL 2.0 2.0   Lab Results  Component Value Date   HGBA1C 6.2* 05/08/2015     Assessment/Plan 1. Loss of weight -has lost 1 more lb  -does continue exercise which helps her moods, eats very well by report, but still losing--no other localizing symptoms - will refer to dietitian to help with this--she requests a supplement that is lactose and sugar-free, but has been unable  to find on her own - Amb ref to Medical Nutrition Therapy-MNT - CBC with Differential/Platelet - Comprehensive metabolic panel - Hemoglobin A1c - Lipid panel - TSH  2. Essential hypertension, benign - bp well controlled; has some dizziness, but this does not sound like it's related to bp as it happens if the sun shines in her face (I cannot explain this) - Comprehensive metabolic panel  3. Primary osteoarthritis of right knee -is planning on surgery for TKA on right with Dr. Rush Farmer due to knee giving out, bone-on-bone OA, pain, near falls and steroid injections no longer helpful -feels vitamin C is helping her?  4. Hyperglycemia - f/u labs today - Amb ref to Medical Nutrition Therapy-MNT - Hemoglobin A1c - Lipid panel  5. B12 deficiency -cont B12 sublingual  6. Mild cognitive impairment with memory loss -discussed risks of delirium in the postop period due to anesthesia, environmental changes, narcotic medications MMSE - Mini Mental State Exam 07/15/2014  Orientation to time 4  Orientation to Place 5  Registration 3  Attention/ Calculation 5  Recall 1  Language- name 2 objects 2  Language- repeat 1  Language- follow 3 step command 2  Language- read & follow direction 1  Write a sentence 1  Copy design 1  Total score 26  previous MMSE last year 26/30; is due for her annual exam in near future  Labs/tests ordered:  Orders Placed This Encounter  Procedures  . CBC with Differential/Platelet  . Comprehensive metabolic panel    Order Specific Question:  Has the patient fasted?    Answer:  Yes  . Hemoglobin A1c  . Lipid panel    Order Specific Question:  Has the patient  fasted?    Answer:  Yes  . TSH  . Amb ref to Medical Nutrition Therapy-MNT    Referral Priority:  Routine    Referral Type:  Consultation    Referral Reason:  Specialty Services Required    Requested Specialty:  Nutrition    Number of Visits Requested:  1    Next appt:  F/u after rehab at [facility]; then next visit must be annual   Kaulder Zahner L. Awab Abebe, D.O. [facility] [address], [place] [address] Cell Phone (Mon-Fri 8am-5pm):  [phone] On Call:  [phone] & follow prompts after 5pm & weekends Office Phone:  [phone] Office Fax:  [phone]

## 2015-09-05 LAB — LIPID PANEL
Chol/HDL Ratio: 1.8 ratio units (ref 0.0–4.4)
Cholesterol, Total: 151 mg/dL (ref 100–199)
HDL: 85 mg/dL (ref >39)
LDL Calculated: 53 mg/dL (ref 0–99)
Triglycerides: 66 mg/dL (ref 0–149)
VLDL Cholesterol Cal: 13 mg/dL (ref 5–40)

## 2015-09-05 LAB — COMPREHENSIVE METABOLIC PANEL
ALT: 12 IU/L (ref 0–32)
AST: 23 IU/L (ref 0–40)
Albumin/Globulin Ratio: 2 (ref 1.2–2.2)
Albumin: 4.3 g/dL (ref 3.5–4.7)
Alkaline Phosphatase: 51 IU/L (ref 39–117)
BUN/Creatinine Ratio: 18 (ref 12–28)
BUN: 36 mg/dL — ABNORMAL HIGH (ref 8–27)
Bilirubin Total: 0.6 mg/dL (ref 0.0–1.2)
CO2: 26 mmol/L (ref 18–29)
Calcium: 10.4 mg/dL — ABNORMAL HIGH (ref 8.7–10.3)
Chloride: 98 mmol/L (ref 96–106)
Creatinine, Ser: 1.96 mg/dL — ABNORMAL HIGH (ref 0.57–1.00)
GFR calc Af Amer: 26 mL/min/{1.73_m2} — ABNORMAL LOW (ref >59)
GFR calc non Af Amer: 23 mL/min/{1.73_m2} — ABNORMAL LOW (ref >59)
Globulin, Total: 2.2 g/dL (ref 1.5–4.5)
Glucose: 102 mg/dL — ABNORMAL HIGH (ref 65–99)
Potassium: 3.8 mmol/L (ref 3.5–5.2)
Sodium: 142 mmol/L (ref 134–144)
Total Protein: 6.5 g/dL (ref 6.0–8.5)

## 2015-09-05 LAB — CBC WITH DIFFERENTIAL/PLATELET
Basophils Absolute: 0 10*3/uL (ref 0.0–0.2)
Basos: 0 %
EOS (ABSOLUTE): 0.1 10*3/uL (ref 0.0–0.4)
Eos: 1 %
Hematocrit: 44.2 % (ref 34.0–46.6)
Hemoglobin: 14.3 g/dL (ref 11.1–15.9)
Immature Grans (Abs): 0 10*3/uL (ref 0.0–0.1)
Immature Granulocytes: 0 %
Lymphocytes Absolute: 0.8 10*3/uL (ref 0.7–3.1)
Lymphs: 13 %
MCH: 30.4 pg (ref 26.6–33.0)
MCHC: 32.4 g/dL (ref 31.5–35.7)
MCV: 94 fL (ref 79–97)
Monocytes Absolute: 0.4 10*3/uL (ref 0.1–0.9)
Monocytes: 7 %
Neutrophils Absolute: 4.4 10*3/uL (ref 1.4–7.0)
Neutrophils: 79 %
Platelets: 217 10*3/uL (ref 150–379)
RBC: 4.71 x10E6/uL (ref 3.77–5.28)
RDW: 15.5 % — ABNORMAL HIGH (ref 12.3–15.4)
WBC: 5.6 10*3/uL (ref 3.4–10.8)

## 2015-09-05 LAB — TSH: TSH: 2.77 u[IU]/mL (ref 0.450–4.500)

## 2015-09-05 LAB — HEMOGLOBIN A1C
Est. average glucose Bld gHb Est-mCnc: 140 mg/dL
Hgb A1c MFr Bld: 6.5 % — ABNORMAL HIGH (ref 4.8–5.6)

## 2015-09-06 ENCOUNTER — Ambulatory Visit (INDEPENDENT_AMBULATORY_CARE_PROVIDER_SITE_OTHER): Payer: Medicare Other | Admitting: Internal Medicine

## 2015-09-06 ENCOUNTER — Encounter: Payer: Self-pay | Admitting: Internal Medicine

## 2015-09-06 VITALS — BP 110/76 | HR 82 | Ht 67.0 in | Wt 120.8 lb

## 2015-09-06 DIAGNOSIS — I4510 Unspecified right bundle-branch block: Secondary | ICD-10-CM | POA: Diagnosis not present

## 2015-09-06 DIAGNOSIS — I493 Ventricular premature depolarization: Secondary | ICD-10-CM | POA: Diagnosis not present

## 2015-09-06 DIAGNOSIS — Z01810 Encounter for preprocedural cardiovascular examination: Secondary | ICD-10-CM | POA: Diagnosis not present

## 2015-09-06 DIAGNOSIS — I10 Essential (primary) hypertension: Secondary | ICD-10-CM

## 2015-09-06 NOTE — Patient Instructions (Signed)
Your physician wants you to follow-up in: 1 year with Dr. Debara Pickett. You will receive a reminder letter in the mail two months in advance. If you don't receive a letter, please call our office to schedule the follow-up appointment.  Dr. Debara Pickett has cleared you for surgery

## 2015-09-06 NOTE — Telephone Encounter (Signed)
Dr. Debara Pickett has cleared patient for surgery. She will need to hold PLAVIX 5 days prior and restart after.

## 2015-09-06 NOTE — Telephone Encounter (Signed)
Clearance faxed.  Routed to Dr. Ninfa Linden via [facility]

## 2015-09-06 NOTE — Progress Notes (Signed)
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 She has over the past year done very well, eats a pretty healthy diet and exercise without any difficulty. She continues to have some cold feet and hands but has had a negative rheumatologic workup with a negative ANA in the past. She does exercise about 3 times a week for 30 minutes. Recently she has noted worsening exercise tolerance and feels that she gets short of breath easier toward the end of exercise. In fact she cannot go nearly as far as she used to go. She denies any chest pain with this however she notes that it does take her longer to recover after exercise. Her last stress test was negative in 2009.  She returns today for followup of her stress test. This was negative for ischemia with an EF greater than 70%. She reports her shortness of breath is resolved and she's not noting palpitations as frequently as she had before.  I saw mod Bendick back in the office today. Overall she is doing well without any complaints. Unfortunate she's been missing some meals and is grieving over the loss of her son which was unexpected. She denies any chest pain or worsening shortness of breath. Weight is getting to be a little bit low and she is aware that she needs to increase her calorie intake. Overall though she denies any palpitations.  Mia [name] returns today for follow-up. She is contemplating right knee surgery due to continued pain and decreased mobility. She is scheduled to have surgery with Dr. Ninfa Linden. From a preoperative standpoint, she denies any chest pain or worsening shortness of breath with exertion. She is able to do for metabolic: Selective activity without any problems. She occasionally gets some leg swelling but that tends to go away when elevating her feet.  PMHx:  Past Medical History    Diagnosis Date  . Hypertension   . TIA (transient ischemic attack)   . Former smoker     Past Surgical History  Procedure Laterality Date  . Transthoracic echocardiogram  12/2005    mild DUST, EF=>55%; mild mitral annular calcif; mild-mod aortic root calcif  . Nm myocar perf wall motion  01/2008    lexiscan myoview - normal pattern of perfusion in all regions, EF 92%, no significant ischemia demonstated    FAMHx:  Family History  Problem Relation Age of Onset  . Lung cancer Brother   . Congestive Heart Failure Mother   . Prostate cancer Son   . Bipolar disorder Son   . Heart disease Son     SOCHx:   reports that she quit smoking about 15 years ago. She has never used smokeless tobacco. She reports that she does not drink alcohol or use illicit drugs.  ALLERGIES:  No Known Allergies  ROS: A comprehensive review of systems was negative.  HOME MEDS: Current Outpatient Prescriptions  Medication Sig Dispense Refill  . amLODipine (NORVASC) 5 MG tablet take 1 tablet by mouth once daily 90 tablet 0  . cholecalciferol (VITAMIN D) 1000 UNITS tablet Take 1,000 Units by mouth daily.    . clopidogrel (PLAVIX) 75 MG tablet take 1 tablet by mouth once daily 90 tablet 3  . ketorolac (ACULAR) 0.5 % ophthalmic solution Place 1 drop into the right eye 2 (two) times daily.    . LUTEIN PO Take  by mouth daily.    . metoprolol succinate (TOPROL-XL) 50 MG 24 hr tablet take 1 tablet by mouth once daily 90 tablet 3  . Probiotic Product (PROBIOTIC PO) Take by mouth daily.    Marland Kitchen triamterene-hydrochlorothiazide (MAXZIDE) 75-50 MG per tablet take 1 tablet by mouth once daily 30 tablet 10   No current facility-administered medications for this visit.    LABS/IMAGING: No results found for this or any previous visit (from the past 48 hour(s)). No results found.  VITALS: BP 110/76 mmHg  Pulse 82  Ht 5\' 7"  (1.702 m)  Wt 120 lb 12.8 oz (54.795 kg)  BMI 18.92 kg/m2  EXAM: General appearance:  alert and no distress Neck: no carotid bruit and no JVD Lungs: clear to auscultation bilaterally Heart: regular rate and rhythm, S1, S2 normal, no murmur, click, rub or gallop Abdomen: soft, non-tender; bowel sounds normal; no masses,  no organomegaly Extremities: extremities normal, atraumatic, no cyanosis or edema Pulses: 2+ and symmetric Skin: Skin color, texture, turgor normal. No rashes or lesions Neurologic: Grossly normal Psych: Pleasant  EKG: Sinus rhythm at 82 with right bundle branch block  ASSESSMENT: 1. Hypertension 2. History of TIA 3. Right bundle branch block 4. Low risk for upcoming surgery  PLAN: 1.   Mia [name] is doing well and asymptomatic from a cardiac standpoint. Her blood pressure is well-controlled. She is not bothered by palpitations, but has been noted to have PVCs in the past. She did have a nonspecific intraventricular conduction delay in the past however today he appears to have a right bundle-branch pattern.she is asymptomatic with this. I think she is low risk for upcoming surgery. She should stop her Plavix 5 days prior to surgery and restart as soon as possible after. Follow-up with me annually or sooner as necessary.   Pixie Casino, MD, [facility] Attending Cardiologist [facility] 09/06/2015, 6:03 PM

## 2015-09-26 ENCOUNTER — Other Ambulatory Visit: Payer: Self-pay | Admitting: Physician Assistant

## 2015-09-28 ENCOUNTER — Other Ambulatory Visit (HOSPITAL_COMMUNITY): Payer: Self-pay | Admitting: *Deleted

## 2015-09-28 NOTE — Progress Notes (Addendum)
CARDIAC CLEARANC ENOTE AND EKG FROM DR HILTY 09-06-15 EPIC  STRESS TETS 08-18-13 EPIC HEMAGLOBIN [id number] EPIC

## 2015-09-28 NOTE — Patient Instructions (Addendum)
Mia [name]  09/28/2015   Your procedure is scheduled on: 10-06-15  Report to [facility] Main  Entrance take [facility]  elevators to 3rd floor to  [facility] a 530  AM.  Call this number if you have problems the morning of surgery [phone]   Remember: ONLY 1 PERSON MAY GO WITH YOU TO SHORT STAY TO GET  READY MORNING OF Mia [name].  Do not eat food or drink liquids :After Midnight.     Take these medicines the morning of surgery with A SIP OF WATER: AMLODIPINE (NORVASC), EYE DROP, METOPROLOL SUCCINATE                               You may not have any metal on your body including hair pins and              piercings  Do not wear jewelry, make-up, lotions, powders or perfumes, deodorant             Do not wear nail polish.  Do not shave  48 hours prior to surgery.              Men may shave face and neck.   Do not bring valuables to the hospital. [place].  Contacts, dentures or bridgework may not be worn into surgery.  Leave suitcase in the car. After surgery it may be brought to your room.                Please read over the following fact sheets you were given: _____________________________________________________________________             [facility] - Preparing for Surgery Before surgery, you can play an important role.  Because skin is not sterile, your skin needs to be as free of germs as possible.  You can reduce the number of germs on your skin by washing with CHG (chlorahexidine gluconate) soap before surgery.  CHG is an antiseptic cleaner which kills germs and bonds with the skin to continue killing germs even after washing. Please DO NOT use if you have an allergy to CHG or antibacterial soaps.  If your skin becomes reddened/irritated stop using the CHG and inform your nurse when you arrive at Short Stay. Do not shave (including legs and underarms) for at least 48 hours prior to the first  CHG shower.  You may shave your face/neck. Please follow these instructions carefully:  1.  Shower with CHG Soap the night before surgery and the  morning of Surgery.  2.  If you choose to wash your hair, wash your hair first as usual with your  normal  shampoo.  3.  After you shampoo, rinse your hair and body thoroughly to remove the  shampoo.                           4.  Use CHG as you would any other liquid soap.  You can apply chg directly  to the skin and wash                       Gently with a scrungie or clean washcloth.  5.  Apply the  CHG Soap to your body ONLY FROM THE NECK DOWN.   Do not use on face/ open                           Wound or open sores. Avoid contact with eyes, ears mouth and genitals (private parts).                       Wash face,  Genitals (private parts) with your normal soap.             6.  Wash thoroughly, paying special attention to the area where your surgery  will be performed.  7.  Thoroughly rinse your body with warm water from the neck down.  8.  DO NOT shower/wash with your normal soap after using and rinsing off  the CHG Soap.                9.  Pat yourself dry with a clean towel.            10.  Wear clean pajamas.            11.  Place clean sheets on your bed the night of your first shower and do not  sleep with pets. Day of Surgery : Do not apply any lotions/deodorants the morning of surgery.  Please wear clean clothes to the hospital/surgery center.  FAILURE TO FOLLOW THESE INSTRUCTIONS MAY RESULT IN THE CANCELLATION OF YOUR SURGERY PATIENT SIGNATURE_________________________________  NURSE SIGNATURE__________________________________  ________________________________________________________________________   Mia [name]  An incentive spirometer is a tool that can help keep your lungs clear and active. This tool measures how well you are filling your lungs with each breath. Taking long deep breaths may help reverse or decrease the  chance of developing breathing (pulmonary) problems (especially infection) following:  A long period of time when you are unable to move or be active. BEFORE THE PROCEDURE   If the spirometer includes an indicator to show your best effort, your nurse or respiratory therapist will set it to a desired goal.  If possible, sit up straight or lean slightly forward. Try not to slouch.  Hold the incentive spirometer in an upright position. INSTRUCTIONS FOR USE   Sit on the edge of your bed if possible, or sit up as far as you can in bed or on a chair.  Hold the incentive spirometer in an upright position.  Breathe out normally.  Place the mouthpiece in your mouth and seal your lips tightly around it.  Breathe in slowly and as deeply as possible, raising the piston or the ball toward the top of the column.  Hold your breath for 3-5 seconds or for as long as possible. Allow the piston or ball to fall to the bottom of the column.  Remove the mouthpiece from your mouth and breathe out normally.  Rest for a few seconds and repeat Steps 1 through 7 at least 10 times every 1-2 hours when you are awake. Take your time and take a few normal breaths between deep breaths.  The spirometer may include an indicator to show your best effort. Use the indicator as a goal to work toward during each repetition.  After each set of 10 deep breaths, practice coughing to be sure your lungs are clear. If you have an incision (the cut made at the time of surgery), support your incision when coughing by placing a pillow or rolled up  towels firmly against it. Once you are able to get out of bed, walk around indoors and cough well. You may stop using the incentive spirometer when instructed by your caregiver.  RISKS AND COMPLICATIONS  Take your time so you do not get dizzy or light-headed.  If you are in pain, you may need to take or ask for pain medication before doing incentive spirometry. It is harder to take a  deep breath if you are having pain. AFTER USE  Rest and breathe slowly and easily.  It can be helpful to keep track of a log of your progress. Your caregiver can provide you with a simple table to help with this. If you are using the spirometer at home, follow these instructions: [place] IF:   You are having difficultly using the spirometer.  You have trouble using the spirometer as often as instructed.  Your pain medication is not giving enough relief while using the spirometer.  You develop fever of 100.5 F (38.1 C) or higher. SEEK IMMEDIATE MEDICAL CARE IF:   You cough up bloody sputum that had not been present before.  You develop fever of 102 F (38.9 C) or greater.  You develop worsening pain at or near the incision site. MAKE SURE YOU:   Understand these instructions.  Will watch your condition.  Will get help right away if you are not doing well or get worse. Document Released: 09/16/2006 Document Revised: 07/29/2011 Document Reviewed: 11/17/2006 Exit[employer] Patient Information 2014 Exit[employer], [place].   ________________________________________________________________________  WHAT IS A BLOOD TRANSFUSION? Blood Transfusion Information  A transfusion is the replacement of blood or some of its parts. Blood is made up of multiple cells which provide different functions.  Red blood cells carry oxygen and are used for blood loss replacement.  White blood cells fight against infection.  Platelets control bleeding.  Plasma helps clot blood.  Other blood products are available for specialized needs, such as hemophilia or other clotting disorders. BEFORE THE TRANSFUSION  Who gives blood for transfusions?   Healthy volunteers who are fully evaluated to make sure their blood is safe. This is blood bank blood. Transfusion therapy is the safest it has ever been in the practice of medicine. Before blood is taken from a donor, a complete history is taken to make  sure that person has no history of diseases nor engages in risky social behavior (examples are intravenous drug use or sexual activity with multiple partners). The donor's travel history is screened to minimize risk of transmitting infections, such as malaria. The donated blood is tested for signs of infectious diseases, such as HIV and hepatitis. The blood is then tested to be sure it is compatible with you in order to minimize the chance of a transfusion reaction. If you or a relative donates blood, this is often done in anticipation of surgery and is not appropriate for emergency situations. It takes many days to process the donated blood. RISKS AND COMPLICATIONS Although transfusion therapy is very safe and saves many lives, the main dangers of transfusion include:   Getting an infectious disease.  Developing a transfusion reaction. This is an allergic reaction to something in the blood you were given. Every precaution is taken to prevent this. The decision to have a blood transfusion has been considered carefully by your caregiver before blood is given. Blood is not given unless the benefits outweigh the risks. AFTER THE TRANSFUSION  Right after receiving a blood transfusion, you will usually feel much  better and more energetic. This is especially true if your red blood cells have gotten low (anemic). The transfusion raises the level of the red blood cells which carry oxygen, and this usually causes an energy increase.  The nurse administering the transfusion will monitor you carefully for complications. HOME CARE INSTRUCTIONS  No special instructions are needed after a transfusion. You may find your energy is better. Speak with your caregiver about any limitations on activity for underlying diseases you may have. SEEK MEDICAL CARE IF:   Your condition is not improving after your transfusion.  You develop redness or irritation at the intravenous (IV) site. SEEK IMMEDIATE MEDICAL CARE IF:   Any of the following symptoms occur over the next 12 hours:  Shaking chills.  You have a temperature by mouth above 102 F (38.9 C), not controlled by medicine.  Chest, back, or muscle pain.  People around you feel you are not acting correctly or are confused.  Shortness of breath or difficulty breathing.  Dizziness and fainting.  You get a rash or develop hives.  You have a decrease in urine output.  Your urine turns a dark color or changes to pink, red, or brown. Any of the following symptoms occur over the next 10 days:  You have a temperature by mouth above 102 F (38.9 C), not controlled by medicine.  Shortness of breath.  Weakness after normal activity.  The white part of the eye turns yellow (jaundice).  You have a decrease in the amount of urine or are urinating less often.  Your urine turns a dark color or changes to pink, red, or brown. Document Released: 05/03/2000 Document Revised: 07/29/2011 Document Reviewed: 12/21/2007 [facility] Patient Information 2014 [place], [place].  _______________________________________________________________________

## 2015-09-29 ENCOUNTER — Encounter (HOSPITAL_COMMUNITY)
Admission: RE | Admit: 2015-09-29 | Discharge: 2015-09-29 | Disposition: A | Discharge status: Home / Self Care | Payer: Medicare Other | Source: Ambulatory Visit | Attending: Orthopaedic Surgery | Admitting: Orthopaedic Surgery

## 2015-09-29 ENCOUNTER — Encounter (HOSPITAL_COMMUNITY): Payer: Self-pay

## 2015-09-29 DIAGNOSIS — Z01812 Encounter for preprocedural laboratory examination: Secondary | ICD-10-CM | POA: Diagnosis not present

## 2015-09-29 HISTORY — DX: Chronic kidney disease, unspecified: N18.9

## 2015-09-29 HISTORY — DX: Unspecified osteoarthritis, unspecified site: M19.90

## 2015-09-29 HISTORY — DX: Prediabetes: R73.03

## 2015-09-29 HISTORY — DX: Personal history of other diseases of the circulatory system: Z86.79

## 2015-09-29 LAB — BASIC METABOLIC PANEL
Anion gap: 9 (ref 5–15)
BUN: 43 mg/dL — ABNORMAL HIGH (ref 6–20)
CO2: 28 mmol/L (ref 22–32)
Calcium: 10.3 mg/dL (ref 8.9–10.3)
Chloride: 105 mmol/L (ref 101–111)
Creatinine, Ser: 1.83 mg/dL — ABNORMAL HIGH (ref 0.44–1.00)
GFR calc Af Amer: 27 mL/min — ABNORMAL LOW (ref >60)
GFR calc non Af Amer: 24 mL/min — ABNORMAL LOW (ref >60)
Glucose, Bld: 129 mg/dL — ABNORMAL HIGH (ref 65–99)
Potassium: 4.3 mmol/L (ref 3.5–5.1)
Sodium: 142 mmol/L (ref 135–145)

## 2015-09-29 LAB — CBC
HCT: 40.9 % (ref 36.0–46.0)
Hemoglobin: 14 g/dL (ref 12.0–15.0)
MCH: 31 pg (ref 26.0–34.0)
MCHC: 34.2 g/dL (ref 30.0–36.0)
MCV: 90.5 fL (ref 78.0–100.0)
Platelets: 215 10*3/uL (ref 150–400)
RBC: 4.52 MIL/uL (ref 3.87–5.11)
RDW: 14.8 % (ref 11.5–15.5)
WBC: 7.2 10*3/uL (ref 4.0–10.5)

## 2015-09-29 LAB — SURGICAL PCR SCREEN
MRSA, PCR: NEGATIVE
Staphylococcus aureus: NEGATIVE

## 2015-09-29 NOTE — Progress Notes (Signed)
bmet results faxed by epic to dr Harrell Gave blackman

## 2015-10-02 ENCOUNTER — Ambulatory Visit: Payer: Medicare Other | Admitting: Dietician

## 2015-10-06 ENCOUNTER — Ambulatory Visit: Payer: Medicare Other | Admitting: Internal Medicine

## 2015-10-06 ENCOUNTER — Inpatient Hospital Stay (HOSPITAL_COMMUNITY): Payer: Medicare Other

## 2015-10-06 ENCOUNTER — Encounter (HOSPITAL_COMMUNITY): Payer: Self-pay

## 2015-10-06 ENCOUNTER — Encounter (HOSPITAL_COMMUNITY)
Admission: RE | Disposition: A | Discharge status: Skilled Nursing Facility | Payer: Self-pay | Source: Ambulatory Visit | Attending: Orthopaedic Surgery

## 2015-10-06 ENCOUNTER — Inpatient Hospital Stay (HOSPITAL_COMMUNITY)
Admission: RE | Admit: 2015-10-06 | Discharge: 2015-10-09 | DRG: 470 | Disposition: A | Discharge status: Skilled Nursing Facility | Payer: Medicare Other | Source: Ambulatory Visit | Attending: Orthopaedic Surgery | Admitting: Orthopaedic Surgery

## 2015-10-06 ENCOUNTER — Inpatient Hospital Stay (HOSPITAL_COMMUNITY): Payer: Medicare Other | Admitting: Anesthesiology

## 2015-10-06 DIAGNOSIS — Z79899 Other long term (current) drug therapy: Secondary | ICD-10-CM | POA: Diagnosis not present

## 2015-10-06 DIAGNOSIS — Z96659 Presence of unspecified artificial knee joint: Secondary | ICD-10-CM

## 2015-10-06 DIAGNOSIS — N189 Chronic kidney disease, unspecified: Secondary | ICD-10-CM | POA: Diagnosis present

## 2015-10-06 DIAGNOSIS — G3184 Mild cognitive impairment, so stated: Secondary | ICD-10-CM | POA: Diagnosis present

## 2015-10-06 DIAGNOSIS — Z8673 Personal history of transient ischemic attack (TIA), and cerebral infarction without residual deficits: Secondary | ICD-10-CM

## 2015-10-06 DIAGNOSIS — E119 Type 2 diabetes mellitus without complications: Secondary | ICD-10-CM | POA: Diagnosis present

## 2015-10-06 DIAGNOSIS — M1711 Unilateral primary osteoarthritis, right knee: Secondary | ICD-10-CM | POA: Diagnosis present

## 2015-10-06 DIAGNOSIS — M25561 Pain in right knee: Secondary | ICD-10-CM | POA: Diagnosis present

## 2015-10-06 DIAGNOSIS — E876 Hypokalemia: Secondary | ICD-10-CM | POA: Diagnosis present

## 2015-10-06 DIAGNOSIS — Z87891 Personal history of nicotine dependence: Secondary | ICD-10-CM

## 2015-10-06 DIAGNOSIS — Z96651 Presence of right artificial knee joint: Secondary | ICD-10-CM

## 2015-10-06 DIAGNOSIS — I129 Hypertensive chronic kidney disease with stage 1 through stage 4 chronic kidney disease, or unspecified chronic kidney disease: Secondary | ICD-10-CM | POA: Diagnosis present

## 2015-10-06 HISTORY — DX: Type 2 diabetes mellitus without complications: E11.9

## 2015-10-06 HISTORY — PX: TOTAL KNEE ARTHROPLASTY: SHX125

## 2015-10-06 HISTORY — DX: Other seasonal allergic rhinitis: J30.2

## 2015-10-06 LAB — APTT: aPTT: 27 seconds (ref 24–37)

## 2015-10-06 LAB — GLUCOSE, CAPILLARY
Glucose-Capillary: 115 mg/dL — ABNORMAL HIGH (ref 65–99)
Glucose-Capillary: 129 mg/dL — ABNORMAL HIGH (ref 65–99)

## 2015-10-06 LAB — ABO/RH: ABO/RH(D): AB POS

## 2015-10-06 LAB — TYPE AND SCREEN
ABO/RH(D): AB POS
Antibody Screen: NEGATIVE

## 2015-10-06 LAB — PROTIME-INR
INR: 1.14 (ref 0.00–1.49)
Prothrombin Time: 14.4 seconds (ref 11.6–15.2)

## 2015-10-06 IMAGING — DX DG KNEE 1-2V PORT*R*
2 series · 2 of 2 positions shown · non-contrast
Comparison: None.

CLINICAL DATA: Status post total knee replacement

EXAM:
PORTABLE RIGHT KNEE - 1-2 VIEW

[knee ap]
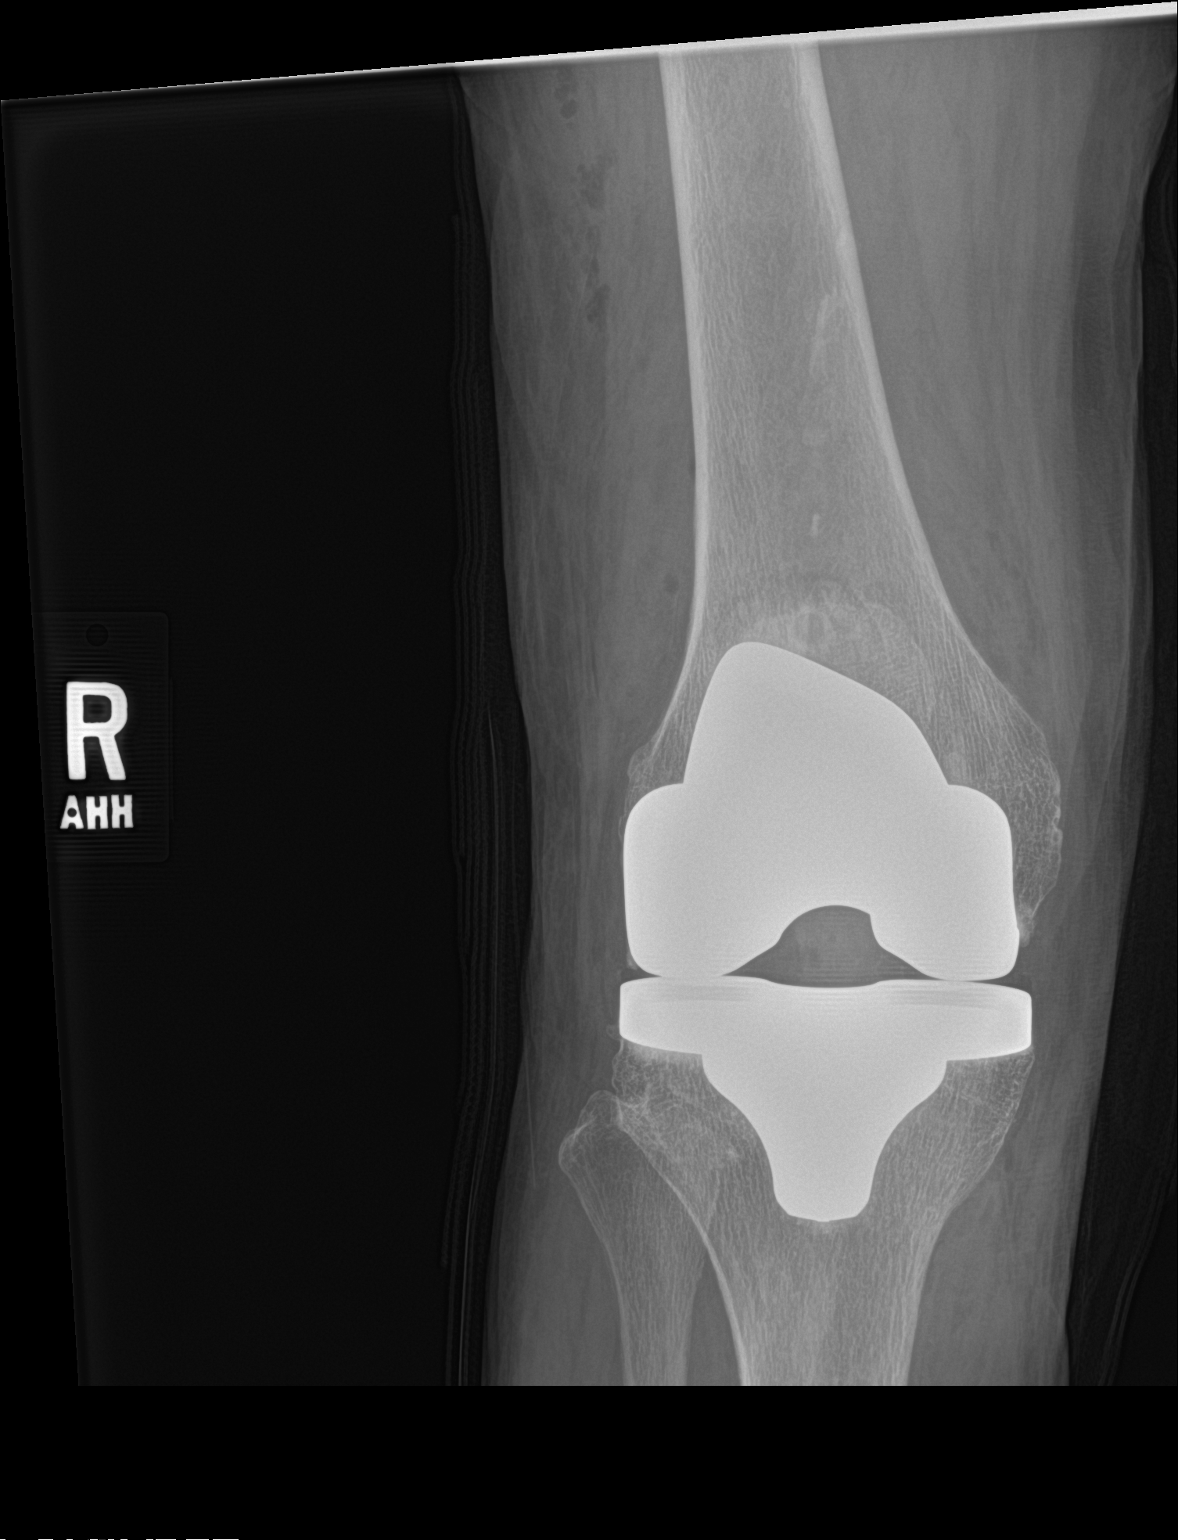

[knee lat]
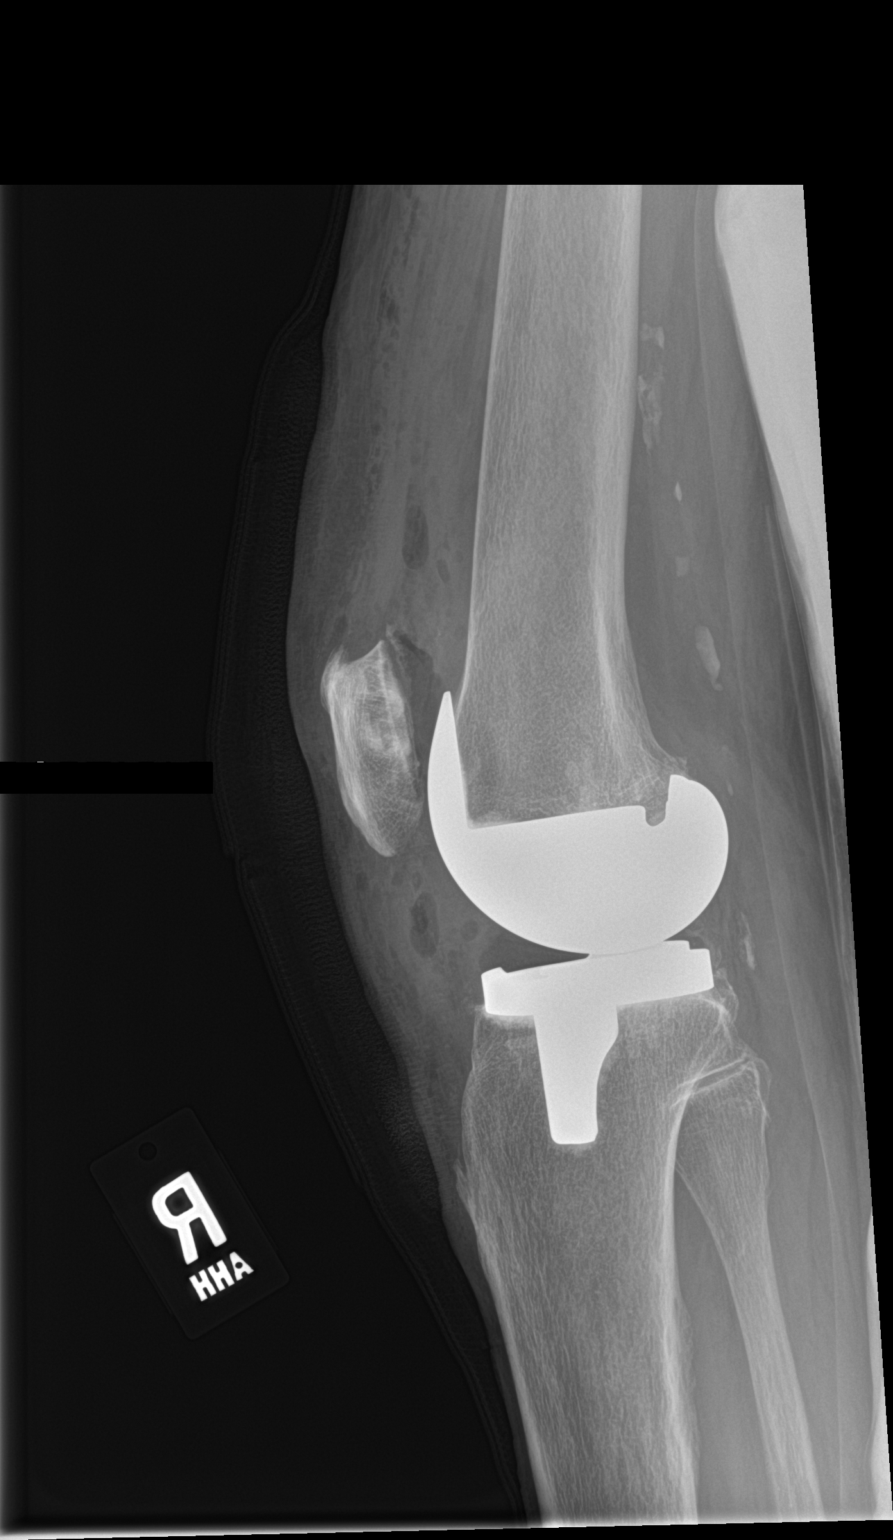

[2 of 2 positions shown; findings below may reference images not displayed]

FINDINGS: Frontal and lateral views were obtained. Patient is status post
total knee replacement with femoral and tibial prosthetic components
appearing well-seated. No acute fracture or dislocation. There is
extensive soft tissue air, an expected postoperative finding. There
are multiple foci of atherosclerotic vascular calcification.
IMPRESSION: Femoral and tibial prosthetic components appear well seated. No
fracture or dislocation.

## 2015-10-06 SURGERY — ARTHROPLASTY, KNEE, TOTAL
Anesthesia: General | Site: Knee | Laterality: Right

## 2015-10-06 MED ORDER — VITAMIN C 500 MG PO TABS
500.0000 mg | ORAL_TABLET | Freq: Every day | ORAL | Status: DC
  Administered 2015-10-07 – 2015-10-09 (×3): 500 mg via ORAL
  Filled 2015-10-06 (×4): qty 1

## 2015-10-06 MED ORDER — ACETAMINOPHEN 650 MG RE SUPP: 650.0000 mg | Freq: Four times a day (QID) | RECTAL | Status: DC | PRN

## 2015-10-06 MED ORDER — FENTANYL CITRATE (PF) 100 MCG/2ML IJ SOLN
INTRAMUSCULAR | Status: AC
  Administered 2015-10-06: 50 ug via INTRAVENOUS
  Filled 2015-10-06: qty 2

## 2015-10-06 MED ORDER — DOCUSATE SODIUM 100 MG PO CAPS
100.0000 mg | ORAL_CAPSULE | Freq: Two times a day (BID) | ORAL | Status: DC
  Administered 2015-10-06 – 2015-10-09 (×7): 100 mg via ORAL
  Filled 2015-10-06 (×7): qty 1

## 2015-10-06 MED ORDER — MENTHOL 3 MG MT LOZG: 1.0000 | LOZENGE | OROMUCOSAL | Status: DC | PRN

## 2015-10-06 MED ORDER — FENTANYL CITRATE (PF) 100 MCG/2ML IJ SOLN
25.0000 ug | INTRAMUSCULAR | Status: DC | PRN
  Administered 2015-10-06 (×2): 50 ug via INTRAVENOUS

## 2015-10-06 MED ORDER — ROCURONIUM BROMIDE 50 MG/5ML IV SOLN
INTRAVENOUS | Status: AC
  Filled 2015-10-06: qty 1

## 2015-10-06 MED ORDER — OCUVITE-LUTEIN PO CAPS
1.0000 | ORAL_CAPSULE | Freq: Every day | ORAL | Status: DC
  Administered 2015-10-07 – 2015-10-09 (×3): 1 via ORAL
  Filled 2015-10-06 (×4): qty 1

## 2015-10-06 MED ORDER — METOCLOPRAMIDE HCL 5 MG/ML IJ SOLN: 5.0000 mg | Freq: Three times a day (TID) | INTRAMUSCULAR | Status: DC | PRN

## 2015-10-06 MED ORDER — CEFAZOLIN SODIUM-DEXTROSE 2-4 GM/100ML-% IV SOLN
2.0000 g | INTRAVENOUS | Status: AC
  Administered 2015-10-06: 2 g via INTRAVENOUS

## 2015-10-06 MED ORDER — PROPOFOL 10 MG/ML IV BOLUS
INTRAVENOUS | Status: AC
  Filled 2015-10-06: qty 20

## 2015-10-06 MED ORDER — DIPHENHYDRAMINE HCL 12.5 MG/5ML PO ELIX: 12.5000 mg | ORAL_SOLUTION | ORAL | Status: DC | PRN

## 2015-10-06 MED ORDER — METOCLOPRAMIDE HCL 5 MG PO TABS: 5.0000 mg | ORAL_TABLET | Freq: Three times a day (TID) | ORAL | Status: DC | PRN

## 2015-10-06 MED ORDER — LACTATED RINGERS IV SOLN
INTRAVENOUS | Status: DC
  Administered 2015-10-06: 10:00:00 via INTRAVENOUS

## 2015-10-06 MED ORDER — ONDANSETRON HCL 4 MG/2ML IJ SOLN
INTRAMUSCULAR | Status: DC | PRN
  Administered 2015-10-06: 4 mg via INTRAVENOUS

## 2015-10-06 MED ORDER — SUGAMMADEX SODIUM 200 MG/2ML IV SOLN
INTRAVENOUS | Status: DC | PRN
  Administered 2015-10-06: 110 mg via INTRAVENOUS

## 2015-10-06 MED ORDER — ALUM & MAG HYDROXIDE-SIMETH 200-200-20 MG/5ML PO SUSP: 30.0000 mL | ORAL | Status: DC | PRN

## 2015-10-06 MED ORDER — SODIUM CHLORIDE 0.9 % IR SOLN
Status: DC | PRN
  Administered 2015-10-06: 3000 mL

## 2015-10-06 MED ORDER — CLOPIDOGREL BISULFATE 75 MG PO TABS
75.0000 mg | ORAL_TABLET | Freq: Every day | ORAL | Status: DC
  Administered 2015-10-06 – 2015-10-09 (×4): 75 mg via ORAL
  Filled 2015-10-06 (×4): qty 1

## 2015-10-06 MED ORDER — LIDOCAINE HCL (CARDIAC) 20 MG/ML IV SOLN
INTRAVENOUS | Status: DC | PRN
  Administered 2015-10-06: 50 mg via INTRAVENOUS

## 2015-10-06 MED ORDER — METHOCARBAMOL 1000 MG/10ML IJ SOLN: 500.0000 mg | Freq: Four times a day (QID) | INTRAMUSCULAR | Status: DC | PRN

## 2015-10-06 MED ORDER — CEFAZOLIN SODIUM 1-5 GM-% IV SOLN: 1.0000 g | Freq: Four times a day (QID) | INTRAVENOUS | Status: DC

## 2015-10-06 MED ORDER — PROPOFOL 10 MG/ML IV BOLUS
INTRAVENOUS | Status: DC | PRN
  Administered 2015-10-06: 120 mg via INTRAVENOUS

## 2015-10-06 MED ORDER — METOPROLOL SUCCINATE ER 50 MG PO TB24
50.0000 mg | ORAL_TABLET | Freq: Every day | ORAL | Status: DC
Start: 2015-10-07 — End: 2015-10-09
  Administered 2015-10-07 – 2015-10-09 (×3): 50 mg via ORAL
  Filled 2015-10-06 (×3): qty 1

## 2015-10-06 MED ORDER — ASPIRIN EC 325 MG PO TBEC
325.0000 mg | DELAYED_RELEASE_TABLET | Freq: Every day | ORAL | Status: DC
  Administered 2015-10-07 – 2015-10-09 (×3): 325 mg via ORAL
  Filled 2015-10-06 (×3): qty 1

## 2015-10-06 MED ORDER — ONDANSETRON HCL 4 MG/2ML IJ SOLN
INTRAMUSCULAR | Status: AC
  Filled 2015-10-06: qty 2

## 2015-10-06 MED ORDER — LACTATED RINGERS IV SOLN
INTRAVENOUS | Status: DC | PRN
  Administered 2015-10-06 (×2): via INTRAVENOUS

## 2015-10-06 MED ORDER — ONDANSETRON HCL 4 MG/2ML IJ SOLN: 4.0000 mg | Freq: Four times a day (QID) | INTRAMUSCULAR | Status: DC | PRN

## 2015-10-06 MED ORDER — POLYETHYLENE GLYCOL 3350 17 G PO PACK: 17.0000 g | PACK | Freq: Every day | ORAL | Status: DC | PRN

## 2015-10-06 MED ORDER — SUGAMMADEX SODIUM 200 MG/2ML IV SOLN
INTRAVENOUS | Status: AC
  Filled 2015-10-06: qty 2

## 2015-10-06 MED ORDER — SUCCINYLCHOLINE CHLORIDE 20 MG/ML IJ SOLN
INTRAMUSCULAR | Status: DC | PRN
  Administered 2015-10-06: 100 mg via INTRAVENOUS

## 2015-10-06 MED ORDER — PHENOL 1.4 % MT LIQD
1.0000 | OROMUCOSAL | Status: DC | PRN
Start: 2015-10-06 — End: 2015-10-09

## 2015-10-06 MED ORDER — HYDROMORPHONE HCL 1 MG/ML IJ SOLN: 0.5000 mg | INTRAMUSCULAR | Status: DC | PRN

## 2015-10-06 MED ORDER — CEFAZOLIN SODIUM-DEXTROSE 2-4 GM/100ML-% IV SOLN
INTRAVENOUS | Status: AC
  Filled 2015-10-06: qty 100

## 2015-10-06 MED ORDER — LIDOCAINE HCL (CARDIAC) 20 MG/ML IV SOLN
INTRAVENOUS | Status: AC
  Filled 2015-10-06: qty 5

## 2015-10-06 MED ORDER — PHENYLEPHRINE HCL 10 MG/ML IJ SOLN
INTRAMUSCULAR | Status: DC | PRN
  Administered 2015-10-06 (×3): 80 ug via INTRAVENOUS

## 2015-10-06 MED ORDER — SODIUM CHLORIDE 0.9 % IV SOLN
INTRAVENOUS | Status: DC
  Administered 2015-10-06: 12:00:00 via INTRAVENOUS
  Administered 2015-10-07: 1000 mL via INTRAVENOUS

## 2015-10-06 MED ORDER — METHOCARBAMOL 500 MG PO TABS
500.0000 mg | ORAL_TABLET | Freq: Four times a day (QID) | ORAL | Status: DC | PRN
  Administered 2015-10-08: 500 mg via ORAL
  Filled 2015-10-06: qty 1

## 2015-10-06 MED ORDER — ACETAMINOPHEN 325 MG PO TABS
650.0000 mg | ORAL_TABLET | Freq: Four times a day (QID) | ORAL | Status: DC | PRN
  Administered 2015-10-07 – 2015-10-09 (×2): 650 mg via ORAL
  Filled 2015-10-06 (×2): qty 2

## 2015-10-06 MED ORDER — CEFAZOLIN SODIUM 1-5 GM-% IV SOLN
1.0000 g | Freq: Two times a day (BID) | INTRAVENOUS | Status: AC
  Administered 2015-10-06 – 2015-10-07 (×2): 1 g via INTRAVENOUS
  Filled 2015-10-06 (×2): qty 50

## 2015-10-06 MED ORDER — FENTANYL CITRATE (PF) 250 MCG/5ML IJ SOLN
INTRAMUSCULAR | Status: AC
  Filled 2015-10-06: qty 5

## 2015-10-06 MED ORDER — TRIAMTERENE-HCTZ 75-50 MG PO TABS
1.0000 | ORAL_TABLET | Freq: Every day | ORAL | Status: DC
  Administered 2015-10-08: 1 via ORAL
  Filled 2015-10-06 (×3): qty 1

## 2015-10-06 MED ORDER — VITAMIN D 1000 UNITS PO TABS
1000.0000 [IU] | ORAL_TABLET | Freq: Every day | ORAL | Status: DC
  Administered 2015-10-07 – 2015-10-09 (×3): 1000 [IU] via ORAL
  Filled 2015-10-06 (×3): qty 1

## 2015-10-06 MED ORDER — ROCURONIUM BROMIDE 100 MG/10ML IV SOLN
INTRAVENOUS | Status: DC | PRN
  Administered 2015-10-06: 25 mg via INTRAVENOUS

## 2015-10-06 MED ORDER — FENTANYL CITRATE (PF) 100 MCG/2ML IJ SOLN
INTRAMUSCULAR | Status: DC | PRN
  Administered 2015-10-06: 50 ug via INTRAVENOUS
  Administered 2015-10-06: 100 ug via INTRAVENOUS
  Administered 2015-10-06 (×2): 50 ug via INTRAVENOUS

## 2015-10-06 MED ORDER — KETOROLAC TROMETHAMINE 0.5 % OP SOLN
1.0000 [drp] | Freq: Two times a day (BID) | OPHTHALMIC | Status: DC
  Administered 2015-10-06 – 2015-10-09 (×6): 1 [drp] via OPHTHALMIC
  Filled 2015-10-06: qty 3

## 2015-10-06 MED ORDER — OXYCODONE HCL 5 MG PO TABS
5.0000 mg | ORAL_TABLET | ORAL | Status: DC | PRN
  Administered 2015-10-06 – 2015-10-08 (×3): 5 mg via ORAL
  Filled 2015-10-06 (×3): qty 1

## 2015-10-06 MED ORDER — ONDANSETRON HCL 4 MG PO TABS: 4.0000 mg | ORAL_TABLET | Freq: Four times a day (QID) | ORAL | Status: DC | PRN

## 2015-10-06 MED ORDER — AMLODIPINE BESYLATE 5 MG PO TABS
5.0000 mg | ORAL_TABLET | Freq: Every day | ORAL | Status: DC
  Administered 2015-10-08 – 2015-10-09 (×2): 5 mg via ORAL
  Filled 2015-10-06 (×3): qty 1

## 2015-10-06 SURGICAL SUPPLY — 51 items
BAG ZIPLOCK 12X15 (MISCELLANEOUS) IMPLANT
BANDAGE ACE 6X5 VEL STRL LF (GAUZE/BANDAGES/DRESSINGS) IMPLANT
BANDAGE ELASTIC 6 VELCRO ST LF (GAUZE/BANDAGES/DRESSINGS) ×2 IMPLANT
BENZOIN TINCTURE PRP APPL 2/3 (GAUZE/BANDAGES/DRESSINGS) ×2 IMPLANT
BLADE SAG 13.0X1.37X90 (BLADE) IMPLANT
BLADE SAG 18X100X1.27 (BLADE) IMPLANT
BOWL SMART MIX CTS (DISPOSABLE) ×2 IMPLANT
CAPT KNEE TOTAL 3 ×2 IMPLANT
CEMENT BONE SIMPLEX SPEEDSET (Cement) ×4 IMPLANT
CLOTH BEACON ORANGE TIMEOUT ST (SAFETY) ×2 IMPLANT
CUFF TOURN SGL QUICK 34 (TOURNIQUET CUFF) ×1
CUFF TRNQT CYL 34X4X40X1 (TOURNIQUET CUFF) ×1 IMPLANT
DRAPE U-SHAPE 47X51 STRL (DRAPES) ×2 IMPLANT
DRSG AQUACEL AG ADV 3.5X10 (GAUZE/BANDAGES/DRESSINGS) IMPLANT
DRSG PAD ABDOMINAL 8X10 ST (GAUZE/BANDAGES/DRESSINGS) IMPLANT
DURAPREP 26ML APPLICATOR (WOUND CARE) ×2 IMPLANT
ELECT REM PT RETURN 9FT ADLT (ELECTROSURGICAL) ×2
ELECTRODE REM PT RTRN 9FT ADLT (ELECTROSURGICAL) ×1 IMPLANT
GAUZE SPONGE 4X4 12PLY STRL (GAUZE/BANDAGES/DRESSINGS) ×2 IMPLANT
GAUZE XEROFORM 1X8 LF (GAUZE/BANDAGES/DRESSINGS) IMPLANT
GLOVE BIO SURGEON STRL SZ7.5 (GLOVE) ×2 IMPLANT
GLOVE BIOGEL PI IND STRL 8 (GLOVE) ×2 IMPLANT
GLOVE BIOGEL PI INDICATOR 8 (GLOVE) ×2
GLOVE ECLIPSE 8.0 STRL XLNG CF (GLOVE) ×2 IMPLANT
GOWN STRL REUS W/TWL XL LVL3 (GOWN DISPOSABLE) ×4 IMPLANT
HANDPIECE INTERPULSE COAX TIP (DISPOSABLE) ×1
IMMOBILIZER KNEE 20 (SOFTGOODS) ×2 IMPLANT
IMMOBILIZER KNEE 20 THIGH 36 (SOFTGOODS) IMPLANT
NS IRRIG 1000ML POUR BTL (IV SOLUTION) ×2 IMPLANT
PACK TOTAL KNEE CUSTOM (KITS) ×2 IMPLANT
PAD ABD 8X10 STRL (GAUZE/BANDAGES/DRESSINGS) ×2 IMPLANT
PADDING CAST COTTON 6X4 STRL (CAST SUPPLIES) ×2 IMPLANT
POSITIONER SURGICAL ARM (MISCELLANEOUS) ×2 IMPLANT
SET HNDPC FAN SPRY TIP SCT (DISPOSABLE) ×1 IMPLANT
SET PAD KNEE POSITIONER (MISCELLANEOUS) ×2 IMPLANT
STAPLER VISISTAT 35W (STAPLE) IMPLANT
STRIP CLOSURE SKIN 1/2X4 (GAUZE/BANDAGES/DRESSINGS) ×2 IMPLANT
SUCTION FRAZIER HANDLE 12FR (TUBING) ×1
SUCTION TUBE FRAZIER 12FR DISP (TUBING) ×1 IMPLANT
SUT MNCRL AB 4-0 PS2 18 (SUTURE) IMPLANT
SUT VIC AB 0 CT1 27 (SUTURE) ×1
SUT VIC AB 0 CT1 27XBRD ANTBC (SUTURE) ×1 IMPLANT
SUT VIC AB 1 CT1 27 (SUTURE) ×2
SUT VIC AB 1 CT1 27XBRD ANTBC (SUTURE) ×2 IMPLANT
SUT VIC AB 2-0 CT1 27 (SUTURE) ×2
SUT VIC AB 2-0 CT1 TAPERPNT 27 (SUTURE) ×2 IMPLANT
TRAY FOLEY W/METER SILVER 14FR (SET/KITS/TRAYS/PACK) ×2 IMPLANT
TRAY FOLEY W/METER SILVER 16FR (SET/KITS/TRAYS/PACK) IMPLANT
WATER STERILE IRR 1500ML POUR (IV SOLUTION) ×2 IMPLANT
WRAP KNEE MAXI GEL POST OP (GAUZE/BANDAGES/DRESSINGS) ×2 IMPLANT
YANKAUER SUCT BULB TIP 10FT TU (MISCELLANEOUS) ×2 IMPLANT

## 2015-10-06 NOTE — Transfer of Care (Signed)
Immediate Anesthesia Transfer of Care Note  Patient: Mia [name]  Procedure(s) Performed: Procedure(s): RIGHT TOTAL KNEE ARTHROPLASTY (Right)  Patient Location: PACU  Anesthesia Type:General  Level of Consciousness:  sedated, patient cooperative and responds to stimulation  Airway & Oxygen Therapy:Patient Spontanous Breathing and Patient connected to face mask oxgen  Post-op Assessment:  Report given to PACU RN and Post -op Vital signs reviewed and stable  Post vital signs:  Reviewed and stable  Last Vitals:  Filed Vitals:   10/06/15 0532  BP: 139/66  Pulse: 84  Temp: 36.5 C  Resp: 16    Complications: No apparent anesthesia complications

## 2015-10-06 NOTE — Anesthesia Postprocedure Evaluation (Signed)
Anesthesia Post Note  Patient: Mia [name]  Procedure(s) Performed: Procedure(s) (LRB): RIGHT TOTAL KNEE ARTHROPLASTY (Right)  Patient location during evaluation: PACU Anesthesia Type: General Level of consciousness: awake Pain management: pain level controlled Vital Signs Assessment: post-procedure vital signs reviewed and stable Respiratory status: spontaneous breathing Cardiovascular status: stable Anesthetic complications: no    Last Vitals:  Filed Vitals:   10/06/15 0943 10/06/15 0945  BP: 105/77 125/80  Pulse: 82 79  Temp: 36.8 C   Resp: 16 13    Last Pain:  Filed Vitals:   10/06/15 0954  PainSc: 0-No pain                 EDWARDS,Keishon Chavarin

## 2015-10-06 NOTE — Evaluation (Signed)
Physical Therapy Evaluation Patient Details Name: Mia [name] MRN: [id number] DOB: [birth year] Today's Date: 10/06/2015   History of Present Illness  Pt s/p R TKR with hx of DM and TIA  Clinical Impression  Pt s/p R TKR presents with decreased R LE strength/ROM and post op pain limiting functional mobility.  Pt would benefit from follow up rehab at SNF level to maximize IND and safety prior to return home with limited assist.    Follow Up Recommendations SNF    Equipment Recommendations  None recommended by PT    Recommendations for Other Services OT consult     Precautions / Restrictions Precautions Precautions: Knee;Fall Required Braces or Orthoses: Knee Immobilizer - Right Knee Immobilizer - Right: Discontinue once straight leg raise with < 10 degree lag Restrictions Weight Bearing Restrictions: No Other Position/Activity Restrictions: WBAT      Mobility  Bed Mobility Overal bed mobility: Needs Assistance Bed Mobility: Supine to Sit     Supine to sit: Mod assist     General bed mobility comments: Increased time with cues for sequence and use of L LE to self assist  Transfers Overall transfer level: Needs assistance Equipment used: Rolling walker (2 wheeled) Transfers: Sit to/from Stand Sit to Stand: Mod assist         General transfer comment: cues for LE management and use of UEs to self assist  Ambulation/Gait Ambulation/Gait assistance: Mod assist Ambulation Distance (Feet): 4 Feet Assistive device: Rolling walker (2 wheeled) Gait Pattern/deviations: Step-to pattern;Shuffle;Decreased step length - left;Decreased stance time - right Gait velocity: decr   General Gait Details: cues for sequence, posture, stride length and position from RW.  Pt ltd by c/o increased pain with WB on R LE  Stairs            Wheelchair Mobility    Modified Rankin (Stroke Patients Only)       Balance                                              Pertinent Vitals/Pain Pain Assessment: 0-10 Pain Score: 9  Pain Location: R knee 9/10 with WB - 5/10 at rest Pain Descriptors / Indicators: Aching;Sore Pain Intervention(s): Limited activity within patient's tolerance;Monitored during session;Premedicated before session    Home Living Family/patient expects to be discharged to:: Skilled nursing facility Living Arrangements: Alone               Additional Comments: Pt lived IND living at [facility]    Prior Function Level of Independence: Needs assistance   Gait / Transfers Assistance Needed: Cane for ambulation           Hand Dominance        Extremity/Trunk Assessment   Upper Extremity Assessment: Overall WFL for tasks assessed           Lower Extremity Assessment: RLE deficits/detail      Cervical / Trunk Assessment: Normal  Communication   Communication: No difficulties  Cognition Arousal/Alertness: Awake/alert Behavior During Therapy: WFL for tasks assessed/performed Overall Cognitive Status: Within Functional Limits for tasks assessed                      General Comments      Exercises Total Joint Exercises Ankle Circles/Pumps: AROM;Both;15 reps;Supine      Assessment/Plan    PT Assessment Patient needs continued  PT services  PT Diagnosis Difficulty walking   PT Problem List Decreased strength;Decreased range of motion;Decreased activity tolerance;Decreased balance;Decreased mobility;Decreased knowledge of use of DME;Pain  PT Treatment Interventions DME instruction;Gait training;Stair training;Functional mobility training;Therapeutic activities;Therapeutic exercise;Patient/family education   PT Goals (Current goals can be found in the Care Plan section) Acute Rehab PT Goals Patient Stated Goal: regain IND PT Goal Formulation: With patient Time For Goal Achievement: 10/10/15 Potential to Achieve Goals: Good    Frequency 7X/week   Barriers to discharge         Co-evaluation               End of Session Equipment Utilized During Treatment: Gait belt;Right knee immobilizer Activity Tolerance: Patient limited by fatigue;Patient limited by pain Patient left: in chair;with call bell/phone within reach;with chair alarm set Nurse Communication: Mobility status         Time: 1545-1620 PT Time Calculation (min) (ACUTE ONLY): 35 min   Charges:   PT Evaluation $PT Eval Low Complexity: 1 Procedure PT Treatments $Gait Training: 8-22 mins   PT G Codes:        Rollande Thursby 2015/10/16, 4:32 PM

## 2015-10-06 NOTE — H&P (Signed)
TOTAL KNEE ADMISSION H&P  Patient is being admitted for right total knee arthroplasty.  Subjective:  Chief Complaint:right knee pain.  HPI: Mia [name], 82 y.o. female, has a history of pain and functional disability in the right knee due to arthritis and has failed non-surgical conservative treatments for greater than 12 weeks to includeNSAID's and/or analgesics, corticosteriod injections, viscosupplementation injections, flexibility and strengthening excercises, supervised PT with diminished ADL's post treatment, use of assistive devices and activity modification.  Onset of symptoms was gradual, starting 5 years ago with gradually worsening course since that time. The patient noted no past surgery on the right knee(s).  Patient currently rates pain in the right knee(s) at 10 out of 10 with activity. Patient has night pain, worsening of pain with activity and weight bearing, pain that interferes with activities of daily living, pain with passive range of motion, crepitus and joint swelling.  Patient has evidence of subchondral sclerosis, periarticular osteophytes and joint space narrowing by imaging studies. There is no active infection.  Patient Active Problem List   Diagnosis Date Noted  . RBBB 09/06/2015  . Preoperative cardiovascular examination 09/06/2015  . Mild cognitive impairment with memory loss 09/04/2015  . Loss of weight 09/04/2015  . Mild cognitive impairment 07/15/2014  . Primary osteoarthritis of right knee 07/15/2014  . Xerosis cutis 07/15/2014  . Superficial burn of groin 11/24/2013  . B12 deficiency 09/23/2013  . Essential hypertension, benign 09/23/2013  . PVC's (premature ventricular contractions) 09/23/2013  . Vitamin D deficiency 09/23/2013  . Hyperglycemia 09/23/2013  . Osteoarthritis of right knee 09/23/2013  . History of TIAs 08/06/2013  . DOE (dyspnea on exertion) 08/06/2013   Past Medical History  Diagnosis Date  . Hypertension   . TIA (transient ischemic  attack) none recent  . Former smoker   . H/O ventricular fibrillation 10 yrs ago dx  . Prediabetes   . Chronic kidney disease     chronic kidney diseaase follow by primary md  . Arthritis     oa  . Seasonal allergies   . Diabetes mellitus without complication ([place])     Diet controlled    Past Surgical History  Procedure Laterality Date  . Transthoracic echocardiogram  12/2005    mild DUST, EF=>55%; mild mitral annular calcif; mild-mod aortic root calcif  . Nm myocar perf wall motion  01/2008    lexiscan myoview - normal pattern of perfusion in all regions, EF 92%, no significant ischemia demonstated  . Tonsillectomy  age 3    and adenoids    Prescriptions prior to admission  Medication Sig Dispense Refill Last Dose  . amLODipine (NORVASC) 5 MG tablet take 1 tablet by mouth once daily 90 tablet 0 10/06/2015 at 0500  . cetirizine (ZYRTEC) 10 MG tablet Take 10 mg by mouth daily.   10/05/2015 at afternoon  . cholecalciferol (VITAMIN D) 1000 UNITS tablet Take 1,000 Units by mouth daily.   10/05/2015 at afternoon  . clopidogrel (PLAVIX) 75 MG tablet take 1 tablet by mouth once daily 90 tablet 3 09/29/2015  . ketorolac (ACULAR) 0.5 % ophthalmic solution Place 1 drop into the right eye 2 (two) times daily.   10/06/2015 at 0500  . LUTEIN PO Take 1 tablet by mouth daily.    10/05/2015 at afternoon  . metoprolol succinate (TOPROL-XL) 50 MG 24 hr tablet take 1 tablet by mouth once daily 90 tablet 3 10/06/2015 at 0500  . Probiotic Product (PROBIOTIC PO) Take 1 capsule by mouth daily.  10/05/2015 at afternoon  . triamterene-hydrochlorothiazide (MAXZIDE) 75-50 MG per tablet take 1 tablet by mouth once daily 30 tablet 10 10/05/2015 at afternoon  . vitamin C (ASCORBIC ACID) 500 MG tablet Take 500 mg by mouth daily.   Past Week at Unknown time  . Cyanocobalamin (VITAMIN B 12 PO) Take by mouth daily. And vit b 6 combo     . multivitamin-lutein (OCUVITE-LUTEIN) CAPS capsule Take 1 capsule by mouth daily.       No Known Allergies  Social History  Substance Use Topics  . Smoking status: Former Smoker -- 0.25 packs/day for 40 years    Types: Cigarettes    Quit date: 07/30/2000  . Smokeless tobacco: Never Used  . Alcohol Use: 4.2 oz/week    7 Glasses of wine per week     Comment: occasional wine    Family History  Problem Relation Age of Onset  . Lung cancer Brother   . Congestive Heart Failure Mother   . Prostate cancer Son   . Bipolar disorder Son   . Heart disease Son      Review of Systems  Musculoskeletal: Positive for joint pain.  All other systems reviewed and are negative.   Objective:  Physical Exam  Constitutional: She is oriented to person, place, and time. She appears well-developed and well-nourished.  HENT:  Head: Normocephalic and atraumatic.  Eyes: EOM are normal.  Neck: Normal range of motion. Neck supple.  Cardiovascular: Normal rate and regular rhythm.   Respiratory: Effort normal and breath sounds normal.  GI: Soft. Bowel sounds are normal.  Musculoskeletal:       Right knee: She exhibits decreased range of motion and swelling. Tenderness found. Medial joint line and lateral joint line tenderness noted.  Neurological: She is alert and oriented to person, place, and time.  Skin: Skin is warm and dry.  Psychiatric: She has a normal mood and affect.    Vital signs in last 24 hours: Temp:  [97.7 F (36.5 C)] 97.7 F (36.5 C) (05/19 0532) Pulse Rate:  [84] 84 (05/19 0532) Resp:  [16] 16 (05/19 0532) BP: (139)/(66) 139/66 mmHg (05/19 0532) SpO2:  [100 %] 100 % (05/19 0532) Weight:  [52.334 kg (115 lb 6 oz)] 52.334 kg (115 lb 6 oz) (05/19 0532)  Labs:   Estimated body mass index is 18.07 kg/(m^2) as calculated from the following:   Height as of this encounter: 5\' 7"  (1.702 m).   Weight as of this encounter: 52.334 kg (115 lb 6 oz).   Imaging Review Plain radiographs demonstrate severe degenerative joint disease of the right knee(s). The overall  alignment isneutral. The bone quality appears to be good for age and reported activity level.  Assessment/Plan:  End stage arthritis, right knee   The patient history, physical examination, clinical judgment of the provider and imaging studies are consistent with end stage degenerative joint disease of the right knee(s) and total knee arthroplasty is deemed medically necessary. The treatment options including medical management, injection therapy arthroscopy and arthroplasty were discussed at length. The risks and benefits of total knee arthroplasty were presented and reviewed. The risks due to aseptic loosening, infection, stiffness, patella tracking problems, thromboembolic complications and other imponderables were discussed. The patient acknowledged the explanation, agreed to proceed with the plan and consent was signed. Patient is being admitted for inpatient treatment for surgery, pain control, PT, OT, prophylactic antibiotics, VTE prophylaxis, progressive ambulation and ADL's and discharge planning. The patient is planning to be discharged to  skilled nursing facility

## 2015-10-06 NOTE — Clinical Social Work Placement (Signed)
   CLINICAL SOCIAL WORK PLACEMENT  NOTE  Date:  10/06/2015  Patient Details  Name: Mia [name] MRN: [id number] Date of Birth: 10/01/1925  Clinical Social Work is seeking post-discharge placement for this patient at the [place] level of care (*CSW will initial, date and re-position this form in  chart as items are completed):  No   Patient/family provided with [place] Work Department's list of facilities offering this level of care within the geographic area requested by the patient (or if unable, by the patient's family).  Yes   Patient/family informed of their freedom to choose among providers that offer the needed level of care, that participate in Medicare, Medicaid or managed care program needed by the patient, have an available bed and are willing to accept the patient.  Yes   Patient/family informed of [facility]'s ownership interest in [facility] and [facility], as well as of the fact that they are under no obligation to receive care at these facilities.  PASRR submitted to EDS on 10/06/15     PASRR number received on 10/06/15     Existing PASRR number confirmed on       FL2 transmitted to all facilities in geographic area requested by pt/family on 10/06/15     FL2 transmitted to all facilities within larger geographic area on       Patient informed that his/her managed care company has contracts with or will negotiate with certain facilities, including the following:            Patient/family informed of bed offers received.  Patient chooses bed at       Physician recommends and patient chooses bed at      Patient to be transferred to   on  .  Patient to be transferred to facility by       Patient family notified on   of transfer.  Name of family member notified:        PHYSICIAN       Additional Comment:    _______________________________________________ Luretha Rued, [place] 10/06/2015, 3:55  PM

## 2015-10-06 NOTE — Progress Notes (Signed)
Utilization review completed.  

## 2015-10-06 NOTE — Clinical Social Work Note (Signed)
Clinical Social Work Assessment  Patient Details  Name: Mia [name] MRN: [id number] Date of Birth: 1926-03-19  Date of referral:  10/06/15               Reason for consult:  Discharge Planning, Facility Placement                Permission sought to share information with:  Chartered certified accountant granted to share information::  Yes, Verbal Permission Granted  Name::        Agency::     Relationship::     Contact Information:     Housing/Transportation Living arrangements for the past 2 months:  Single Family Home Source of Information:  Patient Patient Interpreter Needed:  None Criminal Activity/Legal Involvement Pertinent to Current Situation/Hospitalization:  No - Comment as needed Significant Relationships:  Adult Children, Friend Lives with:  Self Do you feel safe going back to the place where you live?  No (SNF needed.) Need for family participation in patient care:  Yes (Comment)  Care giving concerns:  Pt's care cannot be managed at home following hospital d/c.   Social Worker assessment / plan: Pt hospitalized on 10/06/15 for a pre planned right TKA. CSW met with pt to assist with d/c planning. Pt reports that she is going to have ST Rehab at [facility] at d/c. CSW has contacted [facility] to confirm d/c plan. Awaiting return call. CSW will follow to assist with d/c planning to SNF.  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  Patient/Family's Understanding of and Emotional Response to Diagnosis, Current Treatment, and Prognosis:  Pt is aware of her medical status. " I relieved my surgery is over and all went well. " Pt is motivated to work with therapy.  Emotional Assessment Appearance:  Appears stated age Attitude/Demeanor/Rapport:   Other (cooperative) Affect (typically observed):  Calm, Pleasant, Appropriate Orientation:  Oriented to Self, Oriented to Place, Oriented to  Time, Oriented to Situation Alcohol / Substance use:  Not Applicable Psych involvement (Current and /or in the community):  No (Comment)  Discharge Needs  Concerns to be addressed:  Discharge Planning Concerns Readmission within the last 30 days:  No Current discharge risk:  None Barriers to Discharge:  No Barriers Identified   Luretha Rued, [place] 10/06/2015, 3:28 PM

## 2015-10-06 NOTE — Anesthesia Procedure Notes (Signed)
Procedure Name: Intubation Date/Time: 10/06/2015 7:37 AM Performed by: Anne Fu Pre-anesthesia Checklist: Patient identified, Emergency Drugs available, Suction available, Patient being monitored and Timeout performed Patient Re-evaluated:Patient Re-evaluated prior to inductionOxygen Delivery Method: Circle system utilized Preoxygenation: Pre-oxygenation with 100% oxygen Intubation Type: IV induction Ventilation: Mask ventilation without difficulty Laryngoscope Size: Mac and 4 Grade View: Grade II Tube type: Oral Tube size: 7.5 mm Number of attempts: 1 Airway Equipment and Method: Stylet Placement Confirmation: ETT inserted through vocal cords under direct vision,  positive ETCO2,  CO2 detector and breath sounds checked- equal and bilateral Secured at: 21 cm Tube secured with: Tape Dental Injury: Teeth and Oropharynx as per pre-operative assessment

## 2015-10-06 NOTE — Brief Op Note (Signed)
10/06/2015  9:10 AM  PATIENT:  Guadalupe Maple  81 y.o. female  PRE-OPERATIVE DIAGNOSIS:  severe osteoarthritis right knee  POST-OPERATIVE DIAGNOSIS:  severe osteoarthritis right knee  PROCEDURE:  Procedure(s): RIGHT TOTAL KNEE ARTHROPLASTY (Right)  SURGEON:  Surgeon(s) and Role:    * [name], MD - Primary  PHYSICIAN ASSISTANT: Benita Stabile, PA-C  ANESTHESIA:   general  EBL:  Total I/O In: 1000 [I.V.:1000] Out: 300 [Urine:200; Blood:100]  COUNTS:  YES  TOURNIQUET:   Total Tourniquet Time Documented: Thigh (Right) - 56 minutes Total: Thigh (Right) - 56 minutes   DICTATION: .Other Dictation: Dictation Number [id number]  PLAN OF CARE: Admit to inpatient   PATIENT DISPOSITION:  PACU - hemodynamically stable.   Delay start of Pharmacological VTE agent (>24hrs) due to surgical blood loss or risk of bleeding: no

## 2015-10-06 NOTE — Addendum Note (Signed)
Addendum  created 10/06/15 1017 by Anne Fu, CRNA   Modules edited: Anesthesia Medication Administration

## 2015-10-06 NOTE — NC FL2 (Signed)
[place] LEVEL OF CARE SCREENING TOOL     IDENTIFICATION  Patient Name: Mia [name] Birthdate: 09/21/1925 Sex: female Admission Date (Current Location): 10/06/2015  [facility] and [place] Number:  Herbalist and Address:  [facility],  [place], [place]      Provider Number: [id number]  Attending Physician Name and Address:  [name], *  Relative Name and Phone Number:       Current Level of Care: Hospital Recommended Level of Care: [place] Prior Approval Number:    Date Approved/Denied:   PASRR Number: [id number]  Discharge Plan: SNF    Current Diagnoses: Patient Active Problem List   Diagnosis Date Noted  . Status post total right knee replacement 10/06/2015  . RBBB 09/06/2015  . Preoperative cardiovascular examination 09/06/2015  . Mild cognitive impairment with memory loss 09/04/2015  . Loss of weight 09/04/2015  . Mild cognitive impairment 07/15/2014  . Primary osteoarthritis of right knee 07/15/2014  . Xerosis cutis 07/15/2014  . Superficial burn of groin 11/24/2013  . B12 deficiency 09/23/2013  . Essential hypertension, benign 09/23/2013  . PVC's (premature ventricular contractions) 09/23/2013  . Vitamin D deficiency 09/23/2013  . Hyperglycemia 09/23/2013  . Osteoarthritis of right knee 09/23/2013  . History of TIAs 08/06/2013  . DOE (dyspnea on exertion) 08/06/2013    Orientation RESPIRATION BLADDER Height & Weight     Self, Time, Situation, Place  O2 Indwelling catheter Weight: 52.334 kg (115 lb 6 oz) Height:  5\' 7"  (170.2 cm)  BEHAVIORAL SYMPTOMS/MOOD NEUROLOGICAL BOWEL NUTRITION STATUS  Other (Comment) (no behaviors)   Continent Diet  AMBULATORY STATUS COMMUNICATION OF NEEDS Skin   Extensive Assist Verbally Surgical wounds                       Personal Care Assistance Level of Assistance  Bathing, Feeding, Dressing Bathing Assistance: Limited  assistance Feeding assistance: Independent Dressing Assistance: Limited assistance     Functional Limitations Info  Sight, Hearing, Speech Sight Info: Adequate Hearing Info: Adequate Speech Info: Adequate    SPECIAL CARE FACTORS FREQUENCY  PT (By licensed PT), OT (By licensed OT)     PT Frequency: 5 x wk OT Frequency: 5 x wk            Contractures Contractures Info: Not present    Additional Factors Info  Code Status Code Status Info: Full Code             Current Medications (10/06/2015):  This is the current hospital active medication list Current Facility-Administered Medications  Medication Dose Route Frequency Provider Last Rate Last Dose  . 0.9 %  sodium chloride infusion   Intravenous Continuous [name], MD 50 mL/hr at 10/06/15 1148    . acetaminophen (TYLENOL) tablet 650 mg  650 mg Oral Q6H PRN [name], MD       Or  . acetaminophen (TYLENOL) suppository 650 mg  650 mg Rectal Q6H PRN [name], MD      . alum & mag hydroxide-simeth (MAALOX/MYLANTA) 200-200-20 MG/5ML suspension 30 mL  30 mL Oral Q4H PRN [name], MD      . Derrill Memo ON 10/07/2015] amLODipine (NORVASC) tablet 5 mg  5 mg Oral Daily [name], MD      . Derrill Memo ON 10/07/2015] aspirin EC tablet 325 mg  325 mg Oral Q breakfast [name], MD      .  ceFAZolin (ANCEF) IVPB 1 g/50 mL premix  1 g Intravenous Q12H [name], MD      . cholecalciferol (VITAMIN D) tablet 1,000 Units  1,000 Units Oral Daily [name], MD   1,000 Units at 10/06/15 1300  . clopidogrel (PLAVIX) tablet 75 mg  75 mg Oral Daily [name], MD   75 mg at 10/06/15 1148  . diphenhydrAMINE (BENADRYL) 12.5 MG/5ML elixir 12.5-25 mg  12.5-25 mg Oral Q4H PRN [name], MD      . docusate sodium (COLACE) capsule 100 mg  100 mg Oral BID [name], MD   100 mg at 10/06/15 1542  . HYDROmorphone (DILAUDID)  injection 0.5 mg  0.5 mg Intravenous Q2H PRN [name], MD      . ketorolac (ACULAR) 0.5 % ophthalmic solution 1 drop  1 drop Right Eye BID [name], MD      . menthol-cetylpyridinium (CEPACOL) lozenge 3 mg  1 lozenge Oral PRN [name], MD       Or  . phenol (CHLORASEPTIC) mouth spray 1 spray  1 spray Mouth/Throat PRN [name], MD      . methocarbamol (ROBAXIN) tablet 500 mg  500 mg Oral Q6H PRN [name], MD      . metoCLOPramide (REGLAN) tablet 5 mg  5 mg Oral Q8H PRN [name], MD       Or  . metoCLOPramide (REGLAN) injection 5 mg  5 mg Intravenous Q8H PRN [name], MD      . Derrill Memo ON 10/07/2015] metoprolol succinate (TOPROL-XL) 24 hr tablet 50 mg  50 mg Oral Daily [name], MD      . multivitamin-lutein (OCUVITE-LUTEIN) capsule 1 capsule  1 capsule Oral Daily [name], MD   1 capsule at 10/06/15 1300  . ondansetron (ZOFRAN) tablet 4 mg  4 mg Oral Q6H PRN [name], MD       Or  . ondansetron [facility]) injection 4 mg  4 mg Intravenous Q6H PRN [name], MD      . oxyCODONE (Oxy IR/ROXICODONE) immediate release tablet 5-10 mg  5-10 mg Oral Q3H PRN [name], MD   5 mg at 10/06/15 1542  . polyethylene glycol (MIRALAX / GLYCOLAX) packet 17 g  17 g Oral Daily PRN [name], MD      . Derrill Memo ON 10/07/2015] triamterene-hydrochlorothiazide (MAXZIDE) 75-50 MG per tablet 1 tablet  1 tablet Oral Daily [name], MD      . vitamin C (ASCORBIC ACID) tablet 500 mg  500 mg Oral Daily [name], MD   500 mg at 10/06/15 1300     Discharge Medications: Please see discharge summary for a list of discharge medications.  Relevant Imaging Results:  Relevant Lab Results:   Additional Information SS # [id number]  Mia [name], Randall An, LCSW

## 2015-10-06 NOTE — Anesthesia Preprocedure Evaluation (Signed)
Anesthesia Evaluation  Patient identified by MRN, date of birth, ID band Patient awake    Airway Mallampati: II  TM Distance: >3 FB Neck ROM: Full    Dental   Pulmonary shortness of breath, former smoker,    breath sounds clear to auscultation       Cardiovascular hypertension, + DOE  + dysrhythmias  Rhythm:Regular Rate:Normal     Neuro/Psych    GI/Hepatic negative GI ROS, Neg liver ROS,   Endo/Other  diabetes  Renal/GU Renal disease     Musculoskeletal   Abdominal   Peds  Hematology   Anesthesia Other Findings   Reproductive/Obstetrics                             Anesthesia Physical Anesthesia Plan  ASA: III  Anesthesia Plan: General   Post-op Pain Management:    Induction: Intravenous  Airway Management Planned: Oral ETT  Additional Equipment:   Intra-op Plan:   Post-operative Plan: Extubation in OR  Informed Consent: I have reviewed the patients History and Physical, chart, labs and discussed the procedure including the risks, benefits and alternatives for the proposed anesthesia with the patient or authorized representative who has indicated his/her understanding and acceptance.     Plan Discussed with: CRNA and Anesthesiologist  Anesthesia Plan Comments:         Anesthesia Quick Evaluation

## 2015-10-07 LAB — CBC
HCT: 36 % (ref 36.0–46.0)
Hemoglobin: 11.9 g/dL — ABNORMAL LOW (ref 12.0–15.0)
MCH: 30.4 pg (ref 26.0–34.0)
MCHC: 33.1 g/dL (ref 30.0–36.0)
MCV: 92.1 fL (ref 78.0–100.0)
Platelets: 239 10*3/uL (ref 150–400)
RBC: 3.91 MIL/uL (ref 3.87–5.11)
RDW: 14.3 % (ref 11.5–15.5)
WBC: 9.7 10*3/uL (ref 4.0–10.5)

## 2015-10-07 LAB — BASIC METABOLIC PANEL
Anion gap: 12 (ref 5–15)
BUN: 26 mg/dL — ABNORMAL HIGH (ref 6–20)
CO2: 29 mmol/L (ref 22–32)
Calcium: 9.4 mg/dL (ref 8.9–10.3)
Chloride: 99 mmol/L — ABNORMAL LOW (ref 101–111)
Creatinine, Ser: 1.49 mg/dL — ABNORMAL HIGH (ref 0.44–1.00)
GFR calc Af Amer: 35 mL/min — ABNORMAL LOW (ref >60)
GFR calc non Af Amer: 30 mL/min — ABNORMAL LOW (ref >60)
Glucose, Bld: 144 mg/dL — ABNORMAL HIGH (ref 65–99)
Potassium: 2.9 mmol/L — ABNORMAL LOW (ref 3.5–5.1)
Sodium: 140 mmol/L (ref 135–145)

## 2015-10-07 LAB — GLUCOSE, CAPILLARY: Glucose-Capillary: 140 mg/dL — ABNORMAL HIGH (ref 65–99)

## 2015-10-07 MED ORDER — POTASSIUM CHLORIDE CRYS ER 20 MEQ PO TBCR
40.0000 meq | EXTENDED_RELEASE_TABLET | Freq: Two times a day (BID) | ORAL | Status: AC
  Administered 2015-10-07 (×2): 40 meq via ORAL
  Filled 2015-10-07 (×2): qty 2

## 2015-10-07 NOTE — Progress Notes (Signed)
Physical Therapy Treatment Patient Details Name: Mia [name] MRN: [id number] DOB: Nov 04, 1925 Today's Date: 10/07/2015    History of Present Illness Pt s/p R TKR with hx of DM and TIA    PT Comments    Slow progress 2* c/o pain and "wooziness" with ambulation.  BP 114/46. HR 72, SaO2 100% - RN aware.  Follow Up Recommendations  SNF     Equipment Recommendations  None recommended by PT    Recommendations for Other Services OT consult     Precautions / Restrictions Precautions Precautions: Knee;Fall Required Braces or Orthoses: Knee Immobilizer - Right Knee Immobilizer - Right: Discontinue once straight leg raise with < 10 degree lag Restrictions Weight Bearing Restrictions: No Other Position/Activity Restrictions: WBAT    Mobility  Bed Mobility Overal bed mobility: Needs Assistance Bed Mobility: Supine to Sit     Supine to sit: Mod assist     General bed mobility comments: Increased time with cues for sequence and use of L LE to self assist  Transfers Overall transfer level: Needs assistance Equipment used: Rolling walker (2 wheeled) Transfers: Sit to/from Stand Sit to Stand: +2 physical assistance;+2 safety/equipment;Mod assist;From elevated surface         General transfer comment: cues for LE management and use of UEs to self assist  Ambulation/Gait Ambulation/Gait assistance: Mod assist;+2 physical assistance;+2 safety/equipment Ambulation Distance (Feet): 4 Feet Assistive device: Rolling walker (2 wheeled) Gait Pattern/deviations: Step-to pattern;Decreased step length - right;Decreased step length - left;Shuffle;Trunk flexed     General Gait Details: cues for sequence, posture, stride length and position from RW.  Pt ltd by c/o increased pain with WB on R LE and dizziness - BP 114/46   Stairs            Wheelchair Mobility    Modified Rankin (Stroke Patients Only)       Balance                                     Cognition Arousal/Alertness: Awake/alert Behavior During Therapy: WFL for tasks assessed/performed Overall Cognitive Status: Within Functional Limits for tasks assessed                      Exercises Total Joint Exercises Ankle Circles/Pumps: AROM;Both;15 reps;Supine Quad Sets: AROM;Both;10 reps;Supine Heel Slides: AAROM;Right;10 reps;Supine Straight Leg Raises: AROM;Right;10 reps;Supine Goniometric ROM: AAROM at R knee -12 - 35    General Comments        Pertinent Vitals/Pain Pain Assessment: 0-10 Pain Score: 9  Pain Location: R knee with WB Pain Descriptors / Indicators: Aching;Sore Pain Intervention(s): Limited activity within patient's tolerance;Monitored during session;Premedicated before session;Ice applied    Home Living                      Prior Function            PT Goals (current goals can now be found in the care plan section) Acute Rehab PT Goals Patient Stated Goal: regain IND PT Goal Formulation: With patient Time For Goal Achievement: 10/10/15 Potential to Achieve Goals: Good Progress towards PT goals: Progressing toward goals    Frequency  7X/week    PT Plan Current plan remains appropriate    Co-evaluation             End of Session Equipment Utilized During Treatment: Gait belt;Right knee immobilizer Activity Tolerance: Patient limited  by fatigue;Patient limited by pain Patient left: in chair;with call bell/phone within reach;with chair alarm set     Time: [id number] PT Time Calculation (min) (ACUTE ONLY): 32 min  Charges:  $Gait Training: 8-22 mins $Therapeutic Exercise: 8-22 mins                    G Codes:      Deshun Sedivy 10-26-15, 1:20 PM

## 2015-10-07 NOTE — Progress Notes (Signed)
OT Cancellation Note  Patient Details Name: Mia [name] MRN: [id number] DOB: 1925-12-09   Cancelled Treatment:    Reason Eval/Treat Not Completed: Other (comment). Pt's current D/C plan is SNF. No apparent immediate acute care OT needs, therefore will defer OT to SNF.  Iyad Deroo A 10/07/2015, 8:40 AM

## 2015-10-07 NOTE — Progress Notes (Signed)
Subjective: 1 Day Post-Op Procedure(s) (LRB): RIGHT TOTAL KNEE ARTHROPLASTY (Right) Patient reports pain as moderate.  Minimal anemia.  Does have low potassium (hypokalemia).  Feels well.  Vitals stable.  Objective: Vital signs in last 24 hours: Temp:  [97.5 F (36.4 C)-99.6 F (37.6 C)] 99.6 F (37.6 C) (05/20 0412) Pulse Rate:  [78-94] 94 (05/20 0412) Resp:  [11-20] 16 (05/20 0412) BP: (117-157)/(48-69) 157/64 mmHg (05/20 0412) SpO2:  [92 %-100 %] 98 % (05/20 0412)  Intake/Output from previous day: 05/19 0701 - 05/20 0700 In: 2944.2 [P.O.:720; I.V.:2224.2] Out: 2300 [Urine:2200; Blood:100] Intake/Output this shift:     Recent Labs  10/07/15 0410  HGB 11.9*    Recent Labs  10/07/15 0410  WBC 9.7  RBC 3.91  HCT 36.0  PLT 239    Recent Labs  10/07/15 0410  NA 140  K 2.9*  CL 99*  CO2 29  BUN 26*  CREATININE 1.49*  GLUCOSE 144*  CALCIUM 9.4    Recent Labs  10/06/15 0707  INR 1.14    Sensation intact distally Intact pulses distally Dorsiflexion/Plantar flexion intact Incision: no drainage No cellulitis present Compartment soft  Assessment/Plan: 1 Day Post-Op Procedure(s) (LRB): RIGHT TOTAL KNEE ARTHROPLASTY (Right) Up with therapy Discharge to SNF - Monday Did order Potassium orally for today.  Mia [name] Y 10/07/2015, 9:55 AM

## 2015-10-07 NOTE — Op Note (Signed)
NAMEMALOREY, HIRSHMAN                ACCOUNT NO.:  [id number]  MEDICAL RECORD NO.:  [id number]  LOCATION:  35                         FACILITY:  [facility]  PHYSICIAN:  Lind Guest. Ninfa Linden, M.D.DATE OF BIRTH:  April 04, 1926  DATE OF PROCEDURE:  10/06/2015 DATE OF DISCHARGE:                              OPERATIVE REPORT   PREOPERATIVE DIAGNOSIS:  Severe osteoarthritis and degenerative joint disease of right knee.  POSTOPERATIVE DIAGNOSIS:  Severe osteoarthritis and degenerative joint disease of right knee.  PROCEDURE:  Right total knee arthroplasty.  IMPLANTS:  Stryker Triathlon knee with size 3 femur, size 3 tibial tray, 9-mm fix-bearing polyethylene insert, size 29 patellar button.  SURGEON:  Lind Guest. Ninfa Linden, M.D.  ASSISTANT:  Erskine Emery, PA-C.  ANESTHESIA:  General.  TOURNIQUET TIME:  Less than 1 hour.  BLOOD LOSS:  Less than 100 mL.  COMPLICATIONS:  None.  INDICATIONS:  Ms. Elsberry is an 82 year old female with severe debilitating arthritis involving her right knee.  She has some only fall for some time now, and she has tried and failed all forms of conservative treatment including multiple steroid injections in her knee, a Synvisc injection and walking with assistive device.  Her pain is daily.  It is detrimentally affected her activities of daily living, quality of life and her mobility.  She said she cannot take this anymore, and in spite of her age and comorbidities, her quality of life has been affected so bad that she does wish to proceed with a total knee arthroplasty.  She understands the risk of acute blood loss anemia, nerve and vessel injury, fracture, infection, and DVT.  She understands these goals are heightened due to her age and comorbidities.  She also understands that our goals are decreased pain, improved mobility and overall improved quality of life.  PROCEDURE DESCRIPTION:  After informed consent was obtained, appropriate right knee was  marked.  She was brought to the operating room, placed supine on the operating table.  General anesthesia was then obtained.  A Foley catheter was placed and a nonsterile tourniquet was placed around her upper right thigh.  Her right leg was then prepped and draped with DuraPrep and sterile drapes.  A time-out was called and she was identified as correct patient and correct right knee.  We then used an Esmarch to wrap out the leg and tourniquet was inflated to 300 mm of pressure.  I then made a direct midline incision over the patella and carried this proximally and distally.  We dissected down the knee joint and carried out a medial parapatellar arthrotomy.  We then found a joint effusion with periarticular osteophytes throughout the knee, sclerotic changes and chronic tears in her meniscus.  We removed remnants of the ACL, PCL, medial and lateral meniscus as well as osteophytes throughout the knee.  With the knee in the flexed position, using an extramedullary cutting guide for the tibia, we set our tibia guide for a neutral slope with neutral varus and valgus and taking 9-mm off the high side.  We made this cut without difficulty.  We went to the femur then and made our distal femoral cut for a 10-mm distal femoral cut due  to a slight flexion contracture.  We set this cutting block for her right knee 5 degrees externally rotated and made that cut without difficulty.  We then cleaned more debris from the back of the knee and then brought the knee back down to extension with a 9-mm extension block and we had got her to full extension.  We went back to the femur and put our femoral sizing guide based off the epicondylar axis with 3 degrees of right externally rotated and we chose a size 3 femur.  We put our 4-in-1 cutting block for size 3 femur, made our anterior and posterior cuts followed by our chamfer cuts.  We then made our femoral box cut.  We then went back to the tibia and set our  tibia rotation externally for the tibial tubercle and the femur as well.  We chose a size 3 tibia for this and made our keel cut off this.  With the 3 tibial tray and placement of the 3 femur, we placed a 9-mm fix-bearing polyethylene insert trial and put the knee through range of motion.  I was pleased with the stability and range of motion.  We then cut our patella to take 9 mm off the undersurface of the patella and put this back with a size 29 patellar button.  We then removed all instrumentation and irrigated the knee with normal saline solution.  We mixed our cement and then cemented the real Stryker Triathlon tibial tray size 3 followed by the real size 3 femur.  We placed the real fix-bearing polyethylene insert size 9 for a size 3 tibia and I cemented our patellar button.  Once the cement had hardened, we removed cement debris from the knee.  We then irrigated the knee again with normal saline solution and let our tourniquet down.  Hemostasis was obtained with electrocautery.  We closed the arthrotomy with interrupted #1 Vicryl suture followed by 0 Vicryl in the deep tissue, 2-0 Vicryl in the subcutaneous tissue, 4-0 Monocryl subcuticular stitch.  Steri-Strips were applied as well as well- padded sterile dressing.  She was awakened, extubated and taken to the recovery room in stable condition.  All final counts were correct. There were no complications noted.  Of note, Erskine Emery, PA-C assisted in the entire case, his assistance was crucial for facilitating all aspects of this case.     Lind Guest. Ninfa Linden, M.D.     CYB/MEDQ  D:  10/06/2015  T:  10/07/2015  Job:  [id number]

## 2015-10-07 NOTE — Progress Notes (Signed)
Physical Therapy Treatment Patient Details Name: Mia [name] MRN: [id number] DOB: 1925-09-27 Today's Date: 10/07/2015    History of Present Illness Pt s/p R TKR with hx of DM and TIA    PT Comments    Pt progressing slowly with mobility with increased activity tolerance but increased time required for all tasks.  Follow Up Recommendations  SNF     Equipment Recommendations  None recommended by PT    Recommendations for Other Services OT consult     Precautions / Restrictions Precautions Precautions: Knee;Fall Required Braces or Orthoses: Knee Immobilizer - Right Knee Immobilizer - Right: Discontinue once straight leg raise with < 10 degree lag Restrictions Weight Bearing Restrictions: No Other Position/Activity Restrictions: WBAT    Mobility  Bed Mobility Overal bed mobility: Needs Assistance Bed Mobility: Sit to Supine       Sit to supine: Min assist;Mod assist;+2 for safety/equipment;+2 for physical assistance   General bed mobility comments: Increased time with cues for sequence and use of L LE to self assist  Transfers Overall transfer level: Needs assistance Equipment used: Rolling walker (2 wheeled) Transfers: Sit to/from Stand Sit to Stand: +2 physical assistance;+2 safety/equipment;Mod assist         General transfer comment: cues for LE management and use of UEs to self assist  Ambulation/Gait Ambulation/Gait assistance: Min assist;Mod assist;+2 safety/equipment Ambulation Distance (Feet): 12 Feet Assistive device: Rolling walker (2 wheeled) Gait Pattern/deviations: Step-to pattern;Decreased step length - right;Decreased step length - left;Shuffle;Trunk flexed     General Gait Details: cues for sequence, posture, increased UE WB, stride length and position from RW.     Stairs            Wheelchair Mobility    Modified Rankin (Stroke Patients Only)       Balance                                    Cognition  Arousal/Alertness: Awake/alert Behavior During Therapy: WFL for tasks assessed/performed Overall Cognitive Status: Within Functional Limits for tasks assessed                      Exercises      General Comments        Pertinent Vitals/Pain Pain Assessment: 0-10 Pain Score: 6  Pain Location: R knee Pain Descriptors / Indicators: Aching;Sore Pain Intervention(s): Limited activity within patient's tolerance;Monitored during session;Premedicated before session (Pt declines ice)    Home Living                      Prior Function            PT Goals (current goals can now be found in the care plan section) Acute Rehab PT Goals Patient Stated Goal: regain IND PT Goal Formulation: With patient Time For Goal Achievement: 10/10/15 Potential to Achieve Goals: Good Progress towards PT goals: Progressing toward goals    Frequency  7X/week    PT Plan Current plan remains appropriate    Co-evaluation             End of Session Equipment Utilized During Treatment: Gait belt;Right knee immobilizer Activity Tolerance: Patient tolerated treatment well;Patient limited by fatigue Patient left: in bed;with call bell/phone within reach;with bed alarm set;with family/visitor present     Time: 1339-1405 PT Time Calculation (min) (ACUTE ONLY): 26 min  Charges:  $Gait Training: 23-37 mins  G Codes:      Dametri Ozburn 10-22-15, 4:16 PM

## 2015-10-07 NOTE — Discharge Instructions (Signed)

## 2015-10-08 LAB — GLUCOSE, CAPILLARY: Glucose-Capillary: 116 mg/dL — ABNORMAL HIGH (ref 65–99)

## 2015-10-08 NOTE — Progress Notes (Signed)
Patient ID: Mia [name], female   DOB: September 04, 1925, 82 y.o.   MRN: [id number] Patient is comfortable this morning without complaints. Plan for discharge to skilled nursing on Monday. No new labs this morning.

## 2015-10-08 NOTE — Progress Notes (Signed)
Physical Therapy Treatment Patient Details Name: Mia [name] MRN: [id number] DOB: [birth year] Today's Date: 10/08/2015    History of Present Illness Pt s/p R TKR with hx of DM and TIA    PT Comments    POD # 2 pt progressing slowly and will need [facility]isted [facility] to amb to bathroom then assisted in bathroom before amb back to recliner.  Very unsteady gait with difficulty safely amb.   Follow Up Recommendations  SNF     Equipment Recommendations       Recommendations for Other Services       Precautions / Restrictions Precautions Precautions: Knee;Fall Required Braces or Orthoses: Knee Immobilizer - Right Knee Immobilizer - Right: Discontinue once straight leg raise with < 10 degree lag Restrictions Weight Bearing Restrictions: No Other Position/Activity Restrictions: WBAT    Mobility  Bed Mobility Overal bed mobility: Needs Assistance Bed Mobility: Supine to Sit     Supine to sit: Mod assist     General bed mobility comments: Increased time with cues for sequence and use of L LE to self assist  Transfers Overall transfer level: Needs assistance Equipment used: Rolling walker (2 wheeled) Transfers: Sit to/from Stand Sit to Stand: Mod assist;Max assist         General transfer comment: cues for LE management and use of UEs to self assist  Ambulation/Gait Ambulation/Gait assistance: Mod assist Ambulation Distance (Feet): 11 Feet Assistive device: Rolling walker (2 wheeled) Gait Pattern/deviations: Step-to pattern;Decreased stance time - right;Trunk flexed Gait velocity: decr   General Gait Details: cues for sequence, posture, increased UE WB, stride length and position from RW.  amb to and from bathroom only.   Stairs            Wheelchair Mobility    Modified Rankin (Stroke Patients Only)       Balance                                    Cognition Arousal/Alertness: Awake/alert Behavior During Therapy: WFL for  tasks assessed/performed Overall Cognitive Status: Within Functional Limits for tasks assessed                      Exercises   Total Knee Replacement TE's 10 reps B LE ankle pumps 10 reps towel squeezes 10 reps knee presses 10 reps heel slides  AAROM 10 reps SLR's AAROM 10 reps ABD Followed by ICE     General Comments        Pertinent Vitals/Pain Pain Assessment: 0-10 Pain Score: 6  Pain Location: R knee Pain Descriptors / Indicators: Aching;Grimacing Pain Intervention(s): Monitored during session;Premedicated before session;Repositioned;Ice applied    Home Living                      Prior Function            PT Goals (current goals can now be found in the care plan section) Progress towards PT goals: Progressing toward goals    Frequency  7X/week    PT Plan Current plan remains appropriate    Co-evaluation             End of Session Equipment Utilized During Treatment: Gait belt;Right knee immobilizer Activity Tolerance: Patient tolerated treatment well;Patient limited by fatigue Patient left: in chair;with call bell/phone within reach;with chair alarm set     Time: [id number] PT  Time Calculation (min) (ACUTE ONLY): 27 min  Charges:  $Gait Training: 8-22 mins $Therapeutic Activity: 8-22 mins                    G Codes:      Rica Koyanagi  PTA [facility]  Acute  Rehab Pager      [phone]

## 2015-10-09 MED ORDER — ASPIRIN 325 MG PO TBEC: 325.0000 mg | DELAYED_RELEASE_TABLET | Freq: Every day | ORAL | Status: DC

## 2015-10-09 MED ORDER — METHOCARBAMOL 500 MG PO TABS: 500.0000 mg | ORAL_TABLET | Freq: Four times a day (QID) | ORAL | Status: DC | PRN

## 2015-10-09 MED ORDER — OXYCODONE-ACETAMINOPHEN 5-325 MG PO TABS: 1.0000 | ORAL_TABLET | ORAL | Status: DC | PRN

## 2015-10-09 NOTE — Discharge Summary (Signed)
Patient ID: Mia [name] MRN: [id number] DOB/AGE: [birth year] 82 y.o.  Admit date: 10/06/2015 Discharge date: 10/09/2015  Admission Diagnoses:  Principal Problem:   Primary osteoarthritis of right knee Active Problems:   Status post total right knee replacement   Discharge Diagnoses:  Same  Past Medical History  Diagnosis Date  . Hypertension   . TIA (transient ischemic attack) none recent  . Former smoker   . H/O ventricular fibrillation 10 yrs ago dx  . Prediabetes   . Chronic kidney disease     chronic kidney diseaase follow by primary md  . Arthritis     oa  . Seasonal allergies   . Diabetes mellitus without complication ([place])     Diet controlled    Surgeries: Procedure(s): RIGHT TOTAL KNEE ARTHROPLASTY on 10/06/2015   Consultants:    Discharged Condition: Improved  Hospital Course: Mia [name] is an 82 y.o. female who was admitted 10/06/2015 for operative treatment ofPrimary osteoarthritis of right knee. Patient has severe unremitting pain that affects sleep, daily activities, and work/hobbies. After pre-op clearance the patient was taken to the operating room on 10/06/2015 and underwent  Procedure(s): RIGHT TOTAL KNEE ARTHROPLASTY.    Patient was given perioperative antibiotics: Anti-infectives    Start     Dose/Rate Route Frequency Ordered Stop   10/06/15 1945  ceFAZolin (ANCEF) IVPB 1 g/50 mL premix     1 g 100 mL/hr over 30 Minutes Intravenous Every 12 hours 10/06/15 1129 10/07/15 0720   10/06/15 1100  ceFAZolin (ANCEF) IVPB 1 g/50 mL premix  Status:  Discontinued     1 g 100 mL/hr over 30 Minutes Intravenous Every 6 hours 10/06/15 1057 10/06/15 1129   10/06/15 0539  ceFAZolin (ANCEF) IVPB 2g/100 mL premix     2 g 200 mL/hr over 30 Minutes Intravenous On call to O.R. 10/06/15 [id number] 10/06/15 0741       Patient was given sequential compression devices, early ambulation, and chemoprophylaxis to prevent DVT.  Patient benefited maximally from hospital stay and  there were no complications.    Recent vital signs: Patient Vitals for the past 24 hrs:  BP Temp Temp src Pulse Resp SpO2  10/08/15 2230 124/60 mmHg 98.8 F (37.1 C) Oral 98 18 100 %  10/08/15 1342 (!) 108/47 mmHg 98.2 F (36.8 C) Oral 98 18 100 %  10/08/15 0946 121/62 mmHg 97.5 F (36.4 C) Oral (!) 103 18 100 %     Recent laboratory studies:  Recent Labs  10/07/15 0410  WBC 9.7  HGB 11.9*  HCT 36.0  PLT 239  NA 140  K 2.9*  CL 99*  CO2 29  BUN 26*  CREATININE 1.49*  GLUCOSE 144*  CALCIUM 9.4     Discharge Medications:     Medication List    TAKE these medications        amLODipine 5 MG tablet  Commonly known as:  NORVASC  take 1 tablet by mouth once daily     aspirin 325 MG EC tablet  Take 1 tablet (325 mg total) by mouth daily with breakfast.     cetirizine 10 MG tablet  Commonly known as:  ZYRTEC  Take 10 mg by mouth daily.     cholecalciferol 1000 units tablet  Commonly known as:  VITAMIN D  Take 1,000 Units by mouth daily.     clopidogrel 75 MG tablet  Commonly known as:  PLAVIX  take 1 tablet by mouth once daily     ketorolac 0.5 %  ophthalmic solution  Commonly known as:  ACULAR  Place 1 drop into the right eye 2 (two) times daily.     LUTEIN PO  Take 1 tablet by mouth daily.     methocarbamol 500 MG tablet  Commonly known as:  ROBAXIN  Take 1 tablet (500 mg total) by mouth every 6 (six) hours as needed for muscle spasms.     metoprolol succinate 50 MG 24 hr tablet  Commonly known as:  TOPROL-XL  take 1 tablet by mouth once daily     multivitamin-lutein Caps capsule  Take 1 capsule by mouth daily.     oxyCODONE-acetaminophen 5-325 MG tablet  Commonly known as:  ROXICET  Take 1 tablet by mouth every 4 (four) hours as needed.     PROBIOTIC PO  Take 1 capsule by mouth daily.     triamterene-hydrochlorothiazide 75-50 MG tablet  Commonly known as:  MAXZIDE  take 1 tablet by mouth once daily     VITAMIN B 12 PO  Take by mouth  daily. And vit b 6 combo     vitamin C 500 MG tablet  Commonly known as:  ASCORBIC ACID  Take 500 mg by mouth daily.        Diagnostic Studies: Dg Knee Right Port  10/06/2015  CLINICAL DATA:  Status post total knee replacement EXAM: PORTABLE RIGHT KNEE - 1-2 VIEW COMPARISON:  None. FINDINGS: Frontal and lateral views were obtained. Patient is status post total knee replacement with femoral and tibial prosthetic components appearing well-seated. No acute fracture or dislocation. There is extensive soft tissue air, an expected postoperative finding. There are multiple foci of atherosclerotic vascular calcification. IMPRESSION: Femoral and tibial prosthetic components appear well seated. No fracture or dislocation. Electronically Signed   By: Lowella Grip III M.D.   On: 10/06/2015 10:20    Disposition: to skilled nursing      Discharge Instructions    Discharge patient    Complete by:  As directed            Follow-up Information    Follow up with [name], MD. Schedule an appointment as soon as possible for a visit in 2 weeks.   Specialty:  Orthopedic Surgery   Contact information:   [name] [address] [phone]        Signed: [name] 10/09/2015, 7:17 AM

## 2015-10-09 NOTE — Clinical Social Work Placement (Signed)
   CLINICAL SOCIAL WORK PLACEMENT  NOTE  Date:  10/09/2015  Patient Details  Name: Mia [name] MRN: [id number] Date of Birth: 08/15/1925  Clinical Social Work is seeking post-discharge placement for this patient at the [place] level of care (*CSW will initial, date and re-position this form in  chart as items are completed):  No   Patient/family provided with [facility] Work Department's list of facilities offering this level of care within the geographic area requested by the patient (or if unable, by the patient's family).  Yes   Patient/family informed of their freedom to choose among providers that offer the needed level of care, that participate in Medicare, Medicaid or managed care program needed by the patient, have an available bed and are willing to accept the patient.  Yes   Patient/family informed of [facility]'s ownership interest in [employer] and [facility], as well as of the fact that they are under no obligation to receive care at these facilities.  PASRR submitted to EDS on 10/06/15     PASRR number received on 10/06/15     Existing PASRR number confirmed on       FL2 transmitted to all facilities in geographic area requested by pt/family on 10/06/15     FL2 transmitted to all facilities within larger geographic area on       Patient informed that his/her managed care company has contracts with or will negotiate with certain facilities, including the following:        Yes   Patient/family informed of bed offers received.  Patient chooses bed at [facility]     Physician recommends and patient chooses bed at      Patient to be transferred to [facility] on 10/09/15.  Patient to be transferred to facility by Family     Patient family notified on 10/09/15 of transfer.  Name of family member notified:  DAUGHTER     PHYSICIAN       Additional Comment: Pt / family are in agreement with d/c to [facility] today.  PT approved transport by car. D/C Summary sent to SNF for review prior to d/c. Scripts included in D/C packet. D/C packet provided to pt. # for report provided to [facility].   _______________________________________________ Luretha Rued, [place]  [phone] 10/09/2015, 4:28 PM

## 2015-10-09 NOTE — Care Management Important Message (Signed)
Important Message  Patient Details  Name: Mia [name] MRN: [id number] Date of Birth: 1925-06-02   Medicare Important Message Given:  Yes    [name] 10/09/2015, 10:47 AMImportant Message  Patient Details  Name: Mia [name] MRN: [id number] Date of Birth: Oct 04, 1925   Medicare Important Message Given:  Yes    [name] 10/09/2015, 10:47 AM

## 2015-10-09 NOTE — Progress Notes (Signed)
Pt d/c to [facility]. Transporting to facility via car with daughter.

## 2015-10-09 NOTE — Progress Notes (Signed)
Patient ID: Mia [name], female   DOB: [birth year], 82 y.o.   MRN: [id number] Doing very well.  Vitals stable and right knee stable.  Can be discharged to skilled nursing today.

## 2015-10-10 ENCOUNTER — Non-Acute Institutional Stay (SKILLED_NURSING_FACILITY): Payer: Medicare Other | Admitting: Internal Medicine

## 2015-10-10 ENCOUNTER — Encounter: Payer: Self-pay | Admitting: Internal Medicine

## 2015-10-10 DIAGNOSIS — M1711 Unilateral primary osteoarthritis, right knee: Secondary | ICD-10-CM | POA: Diagnosis not present

## 2015-10-10 DIAGNOSIS — I10 Essential (primary) hypertension: Secondary | ICD-10-CM

## 2015-10-10 DIAGNOSIS — R2681 Unsteadiness on feet: Secondary | ICD-10-CM

## 2015-10-10 DIAGNOSIS — D62 Acute posthemorrhagic anemia: Secondary | ICD-10-CM | POA: Diagnosis not present

## 2015-10-10 DIAGNOSIS — J309 Allergic rhinitis, unspecified: Secondary | ICD-10-CM | POA: Diagnosis not present

## 2015-10-10 DIAGNOSIS — K5901 Slow transit constipation: Secondary | ICD-10-CM

## 2015-10-10 DIAGNOSIS — E538 Deficiency of other specified B group vitamins: Secondary | ICD-10-CM | POA: Diagnosis not present

## 2015-10-10 NOTE — Progress Notes (Signed)
LOCATION: [facility]  PCP: Hollace Kinnier, DO   Code Status: DNR  Goals of care: Advanced Directive information Advanced Directives 10/06/2015  Does patient have an advance directive? Yes  Type of Advance Directive [place]  Does patient want to make changes to advanced directive? No - Patient declined  Copy of advanced directive(s) in chart? Yes       Extended Emergency Contact Information Primary Emergency Contact: Glassner,Steve Address: [address] [place], [place] [address] [place] Phone: [phone] Relation: Son Secondary Emergency Contact: Davis,Chandra Address: [address]          [place], [place] [address] [place] Mobile Phone: [phone] Relation: Daughter   No Known Allergies  Chief Complaint  Patient presents with  . New Admit To SNF    New Admission     HPI:  Patient is a 82 y.o. female seen today for short term rehabilitation post hospital admission from 10/06/15-10/09/15 with right knee OA. She underwent right total knee arthroplasty. She is seen in her room today.   Review of Systems:  Constitutional: Negative for fever, chills, diaphoresis. Energy level is improving.  HENT: Negative for headache, congestion, difficulty swallowing.  has runny nose.  Eyes: Negative for blurred vision, double vision and discharge. Wears glasses.  Respiratory: Negative for shortness of breath and wheezing. Positive for dry cough.   Cardiovascular: Negative for chest pain, palpitations, leg swelling.  Gastrointestinal: Negative for heartburn, nausea, vomiting, abdominal pain. Last bowel movement was last Thursday.  Genitourinary: Negative for dysuria and flank pain.  Musculoskeletal: Negative for fall in the facility.  Skin: Negative for itching, rash.  Neurological: Negative for dizziness. Psychiatric/Behavioral: Negative for depression   Past Medical History  Diagnosis Date  . Hypertension     . TIA (transient ischemic attack) none recent  . Former smoker   . H/O ventricular fibrillation 10 yrs ago dx  . Prediabetes   . Chronic kidney disease     chronic kidney diseaase follow by primary md  . Arthritis     oa  . Seasonal allergies   . Diabetes mellitus without complication ([place])     Diet controlled   Past Surgical History  Procedure Laterality Date  . Transthoracic echocardiogram  12/2005    mild DUST, EF=>55%; mild mitral annular calcif; mild-mod aortic root calcif  . Nm myocar perf wall motion  01/2008    lexiscan myoview - normal pattern of perfusion in all regions, EF 92%, no significant ischemia demonstated  . Tonsillectomy  age 22    and adenoids  . Total knee arthroplasty Right 10/06/2015    Procedure: RIGHT TOTAL KNEE ARTHROPLASTY;  Surgeon: [name], MD;  Location: [facility];  Service: Orthopedics;  Laterality: Right;   Social History:   reports that she quit smoking about 15 years ago. Her smoking use included Cigarettes. She has a 10 pack-year smoking history. She has never used smokeless tobacco. She reports that she drinks about 4.2 oz of alcohol per week. She reports that she does not use illicit drugs.  Family History  Problem Relation Age of Onset  . Lung cancer Brother   . Congestive Heart Failure Mother   . Prostate cancer Son   . Bipolar disorder Son   . Heart disease Son     Medications:   Medication List       This list is accurate as of:  10/10/15  4:24 PM.  Always use your most recent med list.               amLODipine 5 MG tablet  Commonly known as:  NORVASC  take 1 tablet by mouth once daily     aspirin 325 MG EC tablet  Take 1 tablet (325 mg total) by mouth daily with breakfast.     cetirizine 10 MG tablet  Commonly known as:  ZYRTEC  Take 10 mg by mouth daily.     cholecalciferol 1000 units tablet  Commonly known as:  VITAMIN D  Take 1,000 Units by mouth daily.     clopidogrel 75 MG tablet  Commonly known as:   PLAVIX  take 1 tablet by mouth once daily     ketorolac 0.5 % ophthalmic solution  Commonly known as:  ACULAR  Place 1 drop into the right eye 2 (two) times daily.     LUTEIN PO  Take 1 tablet by mouth daily.     methocarbamol 500 MG tablet  Commonly known as:  ROBAXIN  Take 1 tablet (500 mg total) by mouth every 6 (six) hours as needed for muscle spasms.     metoprolol succinate 50 MG 24 hr tablet  Commonly known as:  TOPROL-XL  take 1 tablet by mouth once daily     multivitamin-lutein Caps capsule  Take 1 capsule by mouth daily.     oxyCODONE-acetaminophen 5-325 MG tablet  Commonly known as:  ROXICET  Take 1 tablet by mouth every 4 (four) hours as needed.     PROBIOTIC PO  Take 1 capsule by mouth daily.     triamterene-hydrochlorothiazide 75-50 MG tablet  Commonly known as:  MAXZIDE  take 1 tablet by mouth once daily     VITAMIN B 12 PO  Take by mouth daily. And vit b 6 combo     vitamin C 500 MG tablet  Commonly known as:  ASCORBIC ACID  Take 500 mg by mouth daily.        Immunizations: Immunization History  Administered Date(s) Administered  . Influenza-Unspecified 02/17/2013, 01/18/2014, 12/31/2014  . Pneumococcal Conjugate-13 07/15/2014  . Pneumococcal Polysaccharide-23 05/20/2010  . Tdap 07/09/2015  . Zoster 05/20/2010     Physical Exam: Filed Vitals:   10/10/15 1620  BP: 118/58  Pulse: 76  Temp: 98.2 F (36.8 C)  TempSrc: Oral  Resp: 18  Height: 5\' 7"  (1.702 m)  Weight: 115 lb (52.164 kg)  SpO2: 100%   Body mass index is 18.01 kg/(m^2).  General- elderly female, thin built, in no acute distress Head- normocephalic, atraumatic Nose- no maxillary or frontal sinus tenderness, no nasal discharge Throat- moist mucus membrane  Eyes- PERRLA, EOMI, no pallor, no icterus, no discharge, normal conjunctiva, normal sclera Neck- no cervical lymphadenopathy Cardiovascular- normal s1,s2, no murmur, trace leg edema Respiratory- bilateral clear to  auscultation, no wheeze, no rhonchi, no crackles, no use of accessory muscles Abdomen- bowel sounds present, soft, non tender Musculoskeletal- able to move all 4 extremities, limited right kneel range of motion Neurological- alert and oriented to person, place and time Skin- warm and dry, right knee surgical incision with aquacel dressing Psychiatry- normal mood and affect    Labs reviewed: Basic Metabolic Panel:  Recent Labs  09/04/15 0901 09/29/15 0930 10/07/15 0410  NA 142 142 140  K 3.8 4.3 2.9*  CL 98 105 99*  CO2 26 28 29   GLUCOSE 102* 129* 144*  BUN 36* 43* 26*  CREATININE  1.96* 1.83* 1.49*  CALCIUM 10.4* 10.3 9.4   Liver Function Tests:  Recent Labs  02/27/15 1150 05/08/15 0910 09/04/15 0901  AST 21 22 23   ALT 13 13 12   ALKPHOS 56 55 51  BILITOT 0.4 0.4 0.6  PROT 6.7 6.1 6.5  ALBUMIN 4.3 4.1 4.3   No results for input(s): LIPASE, AMYLASE in the last 8760 hours. No results for input(s): AMMONIA in the last 8760 hours. CBC:  Recent Labs  02/27/15 1150 05/08/15 0910 09/04/15 0901 09/29/15 0930 10/07/15 0410  WBC 8.3 5.0 5.6 7.2 9.7  NEUTROABS 6.9 3.7 4.4  --   --   HGB  --   --   --  14.0 11.9*  HCT 41.0 40.8 44.2 40.9 36.0  MCV 92 92 94 90.5 92.1  PLT 340 243 217 215 239   Cardiac Enzymes: No results for input(s): CKTOTAL, CKMB, CKMBINDEX, TROPONINI in the last 8760 hours. BNP: Invalid input(s): POCBNP CBG:  Recent Labs  10/06/15 0947 10/07/15 2207 10/08/15 0752  GLUCAP 129* 140* 116*    Radiological Exams: Dg Knee Right Port  10/06/2015  CLINICAL DATA:  Status post total knee replacement EXAM: PORTABLE RIGHT KNEE - 1-2 VIEW COMPARISON:  None. FINDINGS: Frontal and lateral views were obtained. Patient is status post total knee replacement with femoral and tibial prosthetic components appearing well-seated. No acute fracture or dislocation. There is extensive soft tissue air, an expected postoperative finding. There are multiple foci of  atherosclerotic vascular calcification. IMPRESSION: Femoral and tibial prosthetic components appear well seated. No fracture or dislocation. Electronically Signed   By: Lowella Grip III M.D.   On: 10/06/2015 10:20    Assessment/Plan  Unsteady gait Will have patient work with PT/OT as tolerated to regain strength and restore function.  Fall precautions are in place.  Right knee OA S/p right knee arthroplasty. Has orthopedics follow up. Will have her work with physical therapy and occupational therapy team to help with gait training and muscle strengthening exercises.fall precautions. Skin care. Encourage to be out of bed. Continue aspirin 325 mg daily for dvt prophylaxis. Continue robaxin 500 mg q6h prn muscle spasm.continue roxicet 5-325 mg q4h prn pain. Add ted hose for leg edema  Blood loss anemia Post op, monitor cbc  Constipation Add prune juice for breakfast. Add colace 100 mg bid prn and monitor  HTN Monitor bp, continue amlodipine 5 mg daily, maxzide 75-50 mg daily and toprol xl 50 mg daily. Check bmp  Allergic rhinitis Continue zyrtec  b12 def Continue b12 supplement  Impaired renal function Monitor bmp   Goals of care: short term rehabilitation   Labs/tests ordered: cbc, cmp 10/12/15  Family/ staff Communication: reviewed care plan with patient and nursing supervisor    Blanchie Serve, MD Internal Medicine [place], [place] [address] Cell Phone (Monday-Friday 8 am - 5 pm): [phone] On Call: [phone] and follow prompts after 5 pm and on weekends Office Phone: [phone] Office Fax: [phone]

## 2015-10-12 LAB — HEPATIC FUNCTION PANEL
ALT: 23 U/L (ref 7–35)
AST: 25 U/L (ref 13–35)
Alkaline Phosphatase: 59 U/L (ref 25–125)
Bilirubin, Total: 3.1 mg/dL

## 2015-10-12 LAB — CBC AND DIFFERENTIAL
HCT: 31 % — AB (ref 36–46)
Hemoglobin: 10 g/dL — AB (ref 12.0–16.0)
Neutrophils Absolute: 7 /uL
Platelets: 327 10*3/uL (ref 150–399)
WBC: 8.3 10^3/mL

## 2015-10-12 LAB — BASIC METABOLIC PANEL
BUN: 36 mg/dL — AB (ref 4–21)
Creatinine: 1.7 mg/dL — AB (ref 0.5–1.1)
Glucose: 112 mg/dL
Potassium: 3.7 mmol/L (ref 3.4–5.3)
Sodium: 144 mmol/L (ref 137–147)

## 2015-10-17 ENCOUNTER — Encounter: Payer: Self-pay | Admitting: Adult Health

## 2015-10-17 NOTE — Progress Notes (Signed)
This encounter was created in error - please disregard.  This encounter was created in error - please disregard.

## 2015-10-18 ENCOUNTER — Non-Acute Institutional Stay (SKILLED_NURSING_FACILITY): Payer: Medicare Other | Admitting: Adult Health

## 2015-10-18 ENCOUNTER — Encounter: Payer: Self-pay | Admitting: Adult Health

## 2015-10-18 DIAGNOSIS — J309 Allergic rhinitis, unspecified: Secondary | ICD-10-CM | POA: Diagnosis not present

## 2015-10-18 DIAGNOSIS — Z8673 Personal history of transient ischemic attack (TIA), and cerebral infarction without residual deficits: Secondary | ICD-10-CM | POA: Diagnosis not present

## 2015-10-18 DIAGNOSIS — I10 Essential (primary) hypertension: Secondary | ICD-10-CM

## 2015-10-18 DIAGNOSIS — M1711 Unilateral primary osteoarthritis, right knee: Secondary | ICD-10-CM | POA: Diagnosis not present

## 2015-10-18 DIAGNOSIS — E538 Deficiency of other specified B group vitamins: Secondary | ICD-10-CM | POA: Diagnosis not present

## 2015-10-18 DIAGNOSIS — K5901 Slow transit constipation: Secondary | ICD-10-CM | POA: Diagnosis not present

## 2015-10-18 DIAGNOSIS — D62 Acute posthemorrhagic anemia: Secondary | ICD-10-CM | POA: Diagnosis not present

## 2015-10-18 DIAGNOSIS — N289 Disorder of kidney and ureter, unspecified: Secondary | ICD-10-CM | POA: Diagnosis not present

## 2015-10-18 DIAGNOSIS — R2681 Unsteadiness on feet: Secondary | ICD-10-CM | POA: Diagnosis not present

## 2015-10-18 NOTE — Progress Notes (Signed)
Patient ID: Mia [name], female   DOB: 03/24/1926, 82 y.o.   MRN: [id number]    DATE:  10/18/15  MRN:  [id number]  BIRTHDAY: March 15, 1926  Facility:  [facility] Location:  [facility] Room Number: [id number]  LEVEL OF CARE:  SNF [phone])  Contact Information    Name Relation Home Work Manti Son [phone]     Ferd Hibbs Daughter   [phone]   Zykiria, [place]   [phone]       Code Status History    This patient does not have a recorded code status. Please follow your organizational policy for patients in this situation.    Advance Directive Documentation   Flowsheet Row Most Recent Value  Type of Advance Directive  Out of facility DNR (pink MOST or yellow form)  Pre-existing out of facility DNR order (yellow form or pink MOST form)  No data  "MOST" Form in Place?  No data       Chief Complaint  Patient presents with  . Discharge Note    HISTORY OF PRESENT ILLNESS:  This is an 89-year female who is for discharge home with Home health PT, OT, CNA and Nursing. DME:  Bedside [facility].  She has been admitted to [facility] on 10/09/15 from [facility] with right knee OA S/P right total knee arthroplasty.  Patient was admitted to this facility for short-term rehabilitation after the patient's recent hospitalization.  Patient has completed SNF rehabilitation and therapy has cleared the patient for discharge.   PAST MEDICAL HISTORY:  Past Medical History:  Diagnosis Date  . Arthritis    oa  . Chronic kidney disease    chronic kidney diseaase follow by primary md  . Diabetes mellitus without complication ([place])    Diet controlled  . Former smoker   . H/O ventricular fibrillation 10 yrs ago dx  . Hypertension   . Prediabetes   . Seasonal allergies   . TIA (transient ischemic attack) none recent     CURRENT MEDICATIONS: Reviewed  Patient's Medications  New Prescriptions    AMLODIPINE (NORVASC) 5 MG TABLET       take 1 tablet by mouth once daily     OXYCODONE-ACETAMINOPHEN (ROXICET) 5-325 MG TABLET   Take 1 tablet by mouth every 4 (four) hours as needed.  Previous Medications   CHOLECALCIFEROL (VITAMIN D) 1000 UNITS TABLET    Take 1,000 Units by mouth daily. D3   FOLIC ACID-VIT Q000111Q 123456 (FOLBEE) 2.5-25-1 MG TABS TABLET    Take 1 tablet by mouth daily.   KETOROLAC (ACULAR) 0.5 % OPHTHALMIC SOLUTION    Place 1 drop into the right eye 2 (two) times daily.  Modified Medications   Modified Medication Previous Medication   CLOPIDOGREL (PLAVIX) 75 MG TABLET clopidogrel (PLAVIX) 75 MG tablet      TAKE 1 TABLET BY MOUTH EVERY DAY    take 1 tablet by mouth once daily   TRIAMTERENE-HYDROCHLOROTHIAZIDE (MAXZIDE) 75-50 MG PER TABLET         TAKE 1 TABLET BY MOUTH DAILY        take 1 tablet by mouth once daily     METOPROLOL SUCCINATE (TOPROL-XL) 50 MG 24 HR TABLET metoprolol succinate (TOPROL-XL) 50 MG 24 hr tablet      TAKE 1 TABLET BY MOUTH ONCE DAILY    take 1 tablet by mouth once daily         ASPIRIN EC 325 MG EC  TABLET    Take 1 tablet (325 mg total) by mouth daily with breakfast.   CETIRIZINE (ZYRTEC) 10 MG TABLET    Take 10 mg by mouth daily. Reported on 11/09/2015   CYANOCOBALAMIN (VITAMIN B 12 PO)    Take by mouth daily. And vit b 6 combo   DOCUSATE SODIUM (COLACE) 100 MG CAPSULE    Take 100 mg by mouth 2 (two) times daily as needed for mild constipation.   LUTEIN PO    Take 10 mg by mouth daily.    METHOCARBAMOL (ROBAXIN) 500 MG TABLET    Take 1 tablet (500 mg total) by mouth every 6 (six) hours as needed for muscle spasms.       MULTIPLE VITAMINS-MINERALS (MULTIVITAMIN WITH MINERALS) TABLET    Take 1 tablet by mouth daily.   MULTIVITAMIN-LUTEIN (OCUVITE-LUTEIN) CAPS CAPSULE    Take 1 capsule by mouth daily.          PROBIOTIC PRODUCT (PROBIOTIC PO)    Take 1 capsule by mouth daily.    SACCHAROMYCES BOULARDII (FLORASTOR) 250 MG CAPSULE    Take 250 mg by mouth daily. Reported on 11/09/2015        VITAMIN C (ASCORBIC ACID) 500 MG TABLET    Take 500 mg by mouth daily.     No Known Allergies   REVIEW OF SYSTEMS:  GENERAL: no change in appetite, no fatigue, no weight changes, no fever, chills or weakness SKIN: Denies rash, itching, wounds, ulcer sores, or nail abnormality EYES: Denies change in vision, dry eyes, eye pain, itching or discharge EARS: Denies change in hearing, ringing in ears, or earache NOSE: Denies nasal congestion or epistaxis MOUTH and THROAT: Denies oral discomfort, gingival pain or bleeding, pain from teeth or hoarseness   RESPIRATORY: no cough, SOB, DOE, wheezing, hemoptysis CARDIAC: no chest pain, edema or palpitations GI: no abdominal pain, diarrhea, constipation, heart burn, nausea or vomiting GU: Denies dysuria, frequency, hematuria, incontinence, or discharge PSYCHIATRIC: Denies feeling of depression or anxiety. No report of hallucinations, insomnia, paranoia, or agitation    PHYSICAL EXAMINATION  GENERAL APPEARANCE: Well nourished. In no acute distress. Normal body habitus SKIN:  Right knee surgical incision is covered with aquacel dressing, dry and no erythema HEAD: Normal in size and contour. No evidence of trauma EYES: Lids open and close normally. No blepharitis, entropion or ectropion. PERRL. Conjunctivae are clear and sclerae are white. Lenses are without opacity EARS: Pinnae are normal. Patient hears normal voice tunes of the examiner MOUTH and THROAT: Lips are without lesions. Oral mucosa is moist and without lesions. Tongue is normal in shape, size, and color and without lesions NECK: supple, trachea midline, no neck masses, no thyroid tenderness, no thyromegaly LYMPHATICS: no LAN in the neck, no supraclavicular LAN RESPIRATORY: breathing is even & unlabored, BS CTAB CARDIAC: RRR, no murmur,no extra heart sounds, no edema GI: abdomen soft, normal BS, no masses, no tenderness, no hepatomegaly, no splenomegaly EXTREMITIES:  Able to  move X 4 extremities PSYCHIATRIC: Alert and oriented X 3. Affect and behavior are appropriate  LABS/RADIOLOGY: Labs reviewed: Recent Labs  09/04/15 0901 09/29/15 0930 10/07/15 0410  NA 142 142 140  K 3.8 4.3 2.9*  CL 98 105 99*  CO2 26 28 29   GLUCOSE 102* 129* 144*  BUN 36* 43* 26*  CREATININE 1.96* 1.83* 1.49*  CALCIUM 10.4* 10.3 9.4    Liver Function Tests:  Recent Labs  02/27/15 1150 05/08/15 0910 09/04/15 0901 10/12/15  AST 21 22 23  25  ALT 13 13 12 23   ALKPHOS 56 55 51 59  BILITOT 0.4 0.4 0.6  --   PROT 6.7 6.1 6.5  --   ALBUMIN 4.3 4.1 4.3  --    CBC:  Recent Labs  09/04/15 0901  09/29/15 0930 10/07/15 0410 10/12/15   WBC 5.6  < > 7.2 9.7 8.3   NEUTROABS 4.4  --   --   --  7   HGB  --   --  14.0 11.9* 10.0*   HCT 44.2  < > 40.9 36.0 31*   MCV 94  --  90.5 92.1  --    PLT 217  < > 215 239 327   < > = values in this interval not displayed.  Lipid Panel:  Recent Labs  05/08/15 0910 09/04/15 0901  HDL 78 85   CBG:  Recent Labs  10/06/15 0947 10/07/15 2207 10/08/15 0752  GLUCAP 129* 140* 116*      ASSESSMENT/PLAN:  Unsteady gait - for Home health PT, OT, CNA and Nursing, for therapeutic strengthening exercises; fall precaution  Right knee OA S/P right total knee arthroplasty - for Home health PT, OT, CNA and Nursing, for therapeutic strengthening exercises; continue Percocet 5/325 mg  1 tab PO Q 4 hours PRN for pain; Robaxin 500 mg 1 tab PO Q 6 hours PRN for muscle spasm; ASA 325 mg 1 tab PO Q D for DVT prophylaxis; follow-up with orthopedic surgeon  Hypertension - well-controlled; continue Amlodipine besylate 5 mg 1 tab PO Q D, Metoprolol Succinate ER 50 mg 1 tab PO Q D and Triamterene-HCTZ 75-50 mg 1 tab PO Q D  Allergic rhinitis - continue Cetirizine 10 mg 1 tab PO Q D  HX of TIA - stable; continue Plavix 75 mg 1 tab PO Q D  Constipation - continue Colace 100 mg 1 capsule PO BID  Vitamin B12 deficiency - continue Vitamin  B12-folic acid 1 tab PO Q D  Impaired renal function - was 1.83 on 09/29/15 and re-check creat 1.49 (10/07/15), better   Anemia, acute blood loss - hgb 10.0; stable      I have filled out patient's discharge paperwork and written prescriptions.  Patient will receive home health PT, OT, Nursing and CNA.   DME provided:  Bedside commode   Total discharge time: Greater than 30 minutes Greater than 50 % was spent in counseling and coordination of care with the patient.   Discharge time involved coordination of the discharge process with social worker, nursing staff and therapy department. Medical justification for home health services/DME verified.    Durenda Age, NP [employer] [phone]

## 2015-10-27 ENCOUNTER — Other Ambulatory Visit: Payer: Self-pay | Admitting: Internal Medicine

## 2015-10-27 NOTE — Telephone Encounter (Signed)
Rx request sent to pharmacy.  

## 2015-11-03 ENCOUNTER — Other Ambulatory Visit: Payer: Medicare Other

## 2015-11-03 ENCOUNTER — Telehealth: Payer: Self-pay | Admitting: *Deleted

## 2015-11-03 DIAGNOSIS — R5383 Other fatigue: Secondary | ICD-10-CM

## 2015-11-03 DIAGNOSIS — I10 Essential (primary) hypertension: Secondary | ICD-10-CM

## 2015-11-03 NOTE — Telephone Encounter (Signed)
Ebony Hail with [facility] called and stated that patient has had a Post Op Knee Replacement and released from [facility]. Patient is complaining about no Energy, Dizziness, Fatigue and more confused. Patient stated that while she was in the facility she was taking Potassium and a Fluid Pill but they released her without the Potassium, she's only taking the fluid pill.   Patient has an appointment with Janett Billow on Monday June 19 @ 10:45. I called patient and LMOM confirming appointment and I also spoke with Ebony Hail with [facility] and she has an appointment with her today at 11:00 and nurse stated that she will also confirm with patient.

## 2015-11-03 NOTE — Telephone Encounter (Signed)
Patient and nurse notified and the nurse will call daughter to bring patient in this afternoon for labs. Order placed and appointment scheduled.

## 2015-11-03 NOTE — Telephone Encounter (Signed)
Can she come in today and get lab work? I would like to get CBC and BMP today if possible

## 2015-11-04 LAB — BASIC METABOLIC PANEL
BUN/Creatinine Ratio: 18 (ref 12–28)
BUN: 40 mg/dL — ABNORMAL HIGH (ref 8–27)
CO2: 23 mmol/L (ref 18–29)
Calcium: 10.5 mg/dL — ABNORMAL HIGH (ref 8.7–10.3)
Chloride: 96 mmol/L (ref 96–106)
Creatinine, Ser: 2.24 mg/dL — ABNORMAL HIGH (ref 0.57–1.00)
GFR calc Af Amer: 22 mL/min/{1.73_m2} — ABNORMAL LOW (ref >59)
GFR calc non Af Amer: 19 mL/min/{1.73_m2} — ABNORMAL LOW (ref >59)
Glucose: 181 mg/dL — ABNORMAL HIGH (ref 65–99)
Potassium: 4.1 mmol/L (ref 3.5–5.2)
Sodium: 138 mmol/L (ref 134–144)

## 2015-11-04 LAB — CBC WITH DIFFERENTIAL/PLATELET
Basophils Absolute: 0 10*3/uL (ref 0.0–0.2)
Basos: 0 %
EOS (ABSOLUTE): 0.1 10*3/uL (ref 0.0–0.4)
Eos: 1 %
Hematocrit: 34.6 % (ref 34.0–46.6)
Hemoglobin: 11.3 g/dL (ref 11.1–15.9)
Immature Grans (Abs): 0 10*3/uL (ref 0.0–0.1)
Immature Granulocytes: 0 %
Lymphocytes Absolute: 1 10*3/uL (ref 0.7–3.1)
Lymphs: 15 %
MCH: 30 pg (ref 26.6–33.0)
MCHC: 32.7 g/dL (ref 31.5–35.7)
MCV: 92 fL (ref 79–97)
Monocytes Absolute: 0.5 10*3/uL (ref 0.1–0.9)
Monocytes: 7 %
Neutrophils Absolute: 5.2 10*3/uL (ref 1.4–7.0)
Neutrophils: 77 %
Platelets: 293 10*3/uL (ref 150–379)
RBC: 3.77 x10E6/uL (ref 3.77–5.28)
RDW: 15.4 % (ref 12.3–15.4)
WBC: 6.9 10*3/uL (ref 3.4–10.8)

## 2015-11-06 ENCOUNTER — Ambulatory Visit (INDEPENDENT_AMBULATORY_CARE_PROVIDER_SITE_OTHER): Payer: Medicare Other | Admitting: Nurse Practitioner

## 2015-11-06 ENCOUNTER — Encounter: Payer: Self-pay | Admitting: Nurse Practitioner

## 2015-11-06 VITALS — BP 118/74 | HR 67 | Temp 97.4°F | Resp 17 | Ht 67.0 in | Wt 109.4 lb

## 2015-11-06 DIAGNOSIS — I10 Essential (primary) hypertension: Secondary | ICD-10-CM | POA: Diagnosis not present

## 2015-11-06 DIAGNOSIS — M1711 Unilateral primary osteoarthritis, right knee: Secondary | ICD-10-CM | POA: Diagnosis not present

## 2015-11-06 DIAGNOSIS — R634 Abnormal weight loss: Secondary | ICD-10-CM

## 2015-11-06 DIAGNOSIS — N184 Chronic kidney disease, stage 4 (severe): Secondary | ICD-10-CM

## 2015-11-06 MED ORDER — TRIAMTERENE-HCTZ 37.5-25 MG PO TABS: 1.0000 | ORAL_TABLET | Freq: Every day | ORAL | Status: DC

## 2015-11-06 MED ORDER — MIRTAZAPINE 7.5 MG PO TABS: 7.5000 mg | ORAL_TABLET | Freq: Every day | ORAL | Status: DC

## 2015-11-06 NOTE — Progress Notes (Signed)
Patient ID: Mia [name], female   DOB: 1926-05-13, 82 y.o.   MRN: [id number]    PCP: Hollace Kinnier, DO  Advanced Directive information Does patient have an advance directive?: Yes, Type of Advance Directive: [place];Living will  No Known Allergies  Chief Complaint  Patient presents with  . Medical Management of Chronic Issues    Knee replacement 10/06/15, no energy since surgery, pulse races with any activity.   . OTHER    SOB in mornings when waking up. no appitite but eats twice daily.   . OTHER    Review labs (labs printed)     HPI: Patient is a 82 y.o. female seen in the office today to follow up hospitalization and rehab stay. Pt with hx of weight loss, mild cognitive impairment, htn, b12 deficiency, and  hyperglycemia. Pt was hospitalized  from 10/06/15-10/09/15 with right knee OA. She underwent right total knee arthroplasty and was at [facility] for rehab. Pt was discharge from [facility] on May 31st. Was doing well in rehab but has not done so well since she has been home. Has not had much of an appetite and decrease energy.  More shortness of breath when she is active. Active level has gone down since she has been at home.  Doing home health PT/OT. Notices shortness of breath with therapy but able to cont therapy after a brief rest period.  Eating small meals- 2 a day. Has lost weight.  Does not wish to take supplements with lactuose.  Forces herself to eat a little meal.    Review of Systems:  Review of Systems  Constitutional: Negative for fever and chills.       Decrease appetite which is ongoing, weight loss noted  HENT: Negative for congestion.   Eyes:       Glasses  Respiratory: Positive for shortness of breath (with exertion).   Cardiovascular: Positive for palpitations (at times). Negative for chest pain and leg swelling.  Gastrointestinal: Negative for abdominal pain, diarrhea and constipation.  Genitourinary: Negative for dysuria.    Musculoskeletal:       S/p TKA of the right knee, no pain noted   Skin: Negative for rash.  Neurological: Negative for dizziness and weakness.  Psychiatric/Behavioral: Negative for suicidal ideas.    Past Medical History  Diagnosis Date  . Hypertension   . TIA (transient ischemic attack) none recent  . Former smoker   . H/O ventricular fibrillation 10 yrs ago dx  . Prediabetes   . Chronic kidney disease     chronic kidney diseaase follow by primary md  . Arthritis     oa  . Seasonal allergies   . Diabetes mellitus without complication ([place])     Diet controlled   Past Surgical History  Procedure Laterality Date  . Transthoracic echocardiogram  12/2005    mild DUST, EF=>55%; mild mitral annular calcif; mild-mod aortic root calcif  . Nm myocar perf wall motion  01/2008    lexiscan myoview - normal pattern of perfusion in all regions, EF 92%, no significant ischemia demonstated  . Tonsillectomy  age 46    and adenoids  . Total knee arthroplasty Right 10/06/2015    Procedure: RIGHT TOTAL KNEE ARTHROPLASTY;  Surgeon: [name], MD;  Location: [facility];  Service: Orthopedics;  Laterality: Right;   Social History:   reports that she quit smoking about 15 years ago. Her smoking use included Cigarettes. She has a 10 pack-year smoking history. She has never  used smokeless tobacco. She reports that she drinks about 4.2 oz of alcohol per week. She reports that she does not use illicit drugs.  Family History  Problem Relation Age of Onset  . Lung cancer Brother   . Congestive Heart Failure Mother   . Prostate cancer Son   . Bipolar disorder Son   . Heart disease Son     Medications: Patient's Medications  New Prescriptions   No medications on file  Previous Medications   AMLODIPINE (NORVASC) 5 MG TABLET    take 1 tablet by mouth once daily   CETIRIZINE (ZYRTEC) 10 MG TABLET    Take 10 mg by mouth daily.   CHOLECALCIFEROL (VITAMIN D) 1000 UNITS TABLET    Take 1,000  Units by mouth daily. D3   CLOPIDOGREL (PLAVIX) 75 MG TABLET    take 1 tablet by mouth once daily   FOLIC ACID-VIT Q000111Q 123456 (FOLBEE) 2.5-25-1 MG TABS TABLET    Take 1 tablet by mouth daily.   KETOROLAC (ACULAR) 0.5 % OPHTHALMIC SOLUTION    Place 1 drop into the right eye 2 (two) times daily.   METHOCARBAMOL (ROBAXIN) 500 MG TABLET    Take 1 tablet (500 mg total) by mouth every 6 (six) hours as needed for muscle spasms.   METOPROLOL SUCCINATE (TOPROL-XL) 50 MG 24 HR TABLET    take 1 tablet by mouth once daily   SACCHAROMYCES BOULARDII (FLORASTOR) 250 MG CAPSULE    Take 250 mg by mouth daily.   TRIAMTERENE-HYDROCHLOROTHIAZIDE (MAXZIDE) 75-50 MG TABLET    take 1 tablet by mouth once daily  Modified Medications   No medications on file  Discontinued Medications   ASPIRIN EC 325 MG EC TABLET    Take 1 tablet (325 mg total) by mouth daily with breakfast.   DOCUSATE SODIUM (COLACE) 100 MG CAPSULE    Take 100 mg by mouth 2 (two) times daily as needed for mild constipation.   LUTEIN PO    Take 10 mg by mouth daily.    MULTIPLE VITAMINS-MINERALS (MULTIVITAMIN WITH MINERALS) TABLET    Take 1 tablet by mouth daily.   OXYCODONE-ACETAMINOPHEN (ROXICET) 5-325 MG TABLET    Take 1 tablet by mouth every 4 (four) hours as needed.     Physical Exam:  Filed Vitals:   11/06/15 1039  BP: 118/74  Pulse: 67  Temp: 97.4 F (36.3 C)  TempSrc: Oral  Resp: 17  Height: 5\' 7"  (1.702 m)  Weight: 109 lb 6.4 oz (49.624 kg)  SpO2: 96%   Body mass index is 17.13 kg/(m^2).  Physical Exam  Constitutional: She is oriented to person, place, and time. No distress.  Thin tall black female  Neck: Normal range of motion. Neck supple.  Cardiovascular: Normal rate, regular rhythm and normal heart sounds.   Pulmonary/Chest: Effort normal and breath sounds normal. No respiratory distress.  Abdominal: Soft. Bowel sounds are normal.  Musculoskeletal: She exhibits no edema or tenderness.  Slow gait, small steps, using  cane for support  Neurological: She is alert and oriented to person, place, and time.  Skin: Skin is warm and dry.  Psychiatric: She has a normal mood and affect.  Mild memory loss noted     Labs reviewed: Basic Metabolic Panel:  Recent Labs  01/13/15 1130  09/04/15 0901  09/29/15 0930 10/07/15 0410 10/12/15 11/03/15 1334  NA 142  < > 142  < > 142 140 144 138  K 3.8  < > 3.8  --  4.3  2.9* 3.7 4.1  CL 99  < > 98  --  105 99*  --  96  CO2 25  < > 26  --  28 29  --  23  GLUCOSE 113*  < > 102*  --  129* 144*  --  181*  BUN 29*  < > 36*  < > 43* 26* 36* 40*  CREATININE 1.73*  < > 1.96*  --  1.83* 1.49* 1.7* 2.24*  CALCIUM 10.3  < > 10.4*  --  10.3 9.4  --  10.5*  TSH 2.080  --  2.770  --   --   --   --   --   < > = values in this interval not displayed. Liver Function Tests:  Recent Labs  02/27/15 1150 05/08/15 0910 09/04/15 0901 10/12/15  AST 21 22 23 25   ALT 13 13 12 23   ALKPHOS 56 55 51 59  BILITOT 0.4 0.4 0.6  --   PROT 6.7 6.1 6.5  --   ALBUMIN 4.3 4.1 4.3  --    No results for input(s): LIPASE, AMYLASE in the last 8760 hours. No results for input(s): AMMONIA in the last 8760 hours. CBC:  Recent Labs  09/04/15 0901  09/29/15 0930 10/07/15 0410 10/12/15 11/03/15 1334  WBC 5.6  < > 7.2 9.7 8.3 6.9  NEUTROABS 4.4  --   --   --  7 5.2  HGB  --   --  14.0 11.9* 10.0*  --   HCT 44.2  < > 40.9 36.0 31* 34.6  MCV 94  --  90.5 92.1  --  92  PLT 217  < > 215 239 327 293  < > = values in this interval not displayed. Lipid Panel:  Recent Labs  01/13/15 1130 05/08/15 0910 09/04/15 0901  CHOL 163 156 151  HDL 81 78 85  LDLCALC 59 59 53  TRIG 115 97 66  CHOLHDL 2.0 2.0 1.8   TSH:  Recent Labs  01/13/15 1130 09/04/15 0901  TSH 2.080 2.770   A1C: Lab Results  Component Value Date   HGBA1C 6.5* 09/04/2015     Assessment/Plan 1. Primary osteoarthritis of right knee -pain well controlled, conts to work with home health therapy  2. Loss of  weight -ongoing, encourage to eat 3 meals a day, does not prefer to take supplements due to lactose intolerance -TSH WNL in April  - mirtazapine (REMERON) 7.5 MG tablet; Take 1 tablet (7.5 mg total) by mouth at bedtime.  Dispense: 30 tablet; Refill: 1  3. Essential hypertension, benign -blood pressure stable, will decrease maxzide due to elevated kidney function. To take blood pressure at home and bring to follow up visit - triamterene-hydrochlorothiazide (MAXZIDE-25) 37.5-25 MG tablet; Take 1 tablet by mouth daily.  Dispense: 90 tablet; Refill: 3  4. CKD (chronic kidney disease) stage 4, GFR 15-29 ml/min (HCC) -not using NSAIDs, encouraged to stay hydrated - will decrease triamterene-hydrochlorothiazide (MAXZIDE-25) to 1/2 tablet--- now to take  37.5-25 MG tablet; Take 1 tablet by mouth daily.  Dispense: 90 tablet; Refill: 3 - Basic metabolic panel; Future  To follow up in 1 month with lab work prior to visit   Jessica K. Harle Battiest  [facility] [phone] 8 am - 5 pm) [phone] (after hours)

## 2015-11-06 NOTE — Patient Instructions (Addendum)
To start Remeron 7.5 mg at night for appetite stimulate  Did you see the dietitian? Dr Mariea Clonts had recommended this  To decrease triamterene-hydrochlorothiazide to 1/2 tablet daily (37.5/25 mg)  To take blood pressure 2-3 times weekly after you have taken your medication and are sitting for at least 5 mins- record and bring to follow up appt  Avoid NSAIDS (advil, aleve, motrin, ibuprofen)   To follow up in 4 weeks on blood pressure, kidney function, weight and appetite.  Lab work prior to visit

## 2015-11-09 ENCOUNTER — Ambulatory Visit: Payer: Medicare Other | Attending: Orthopaedic Surgery

## 2015-11-09 DIAGNOSIS — M6281 Muscle weakness (generalized): Secondary | ICD-10-CM | POA: Diagnosis present

## 2015-11-09 DIAGNOSIS — M25561 Pain in right knee: Secondary | ICD-10-CM | POA: Diagnosis present

## 2015-11-09 DIAGNOSIS — R262 Difficulty in walking, not elsewhere classified: Secondary | ICD-10-CM | POA: Diagnosis present

## 2015-11-09 DIAGNOSIS — M25661 Stiffness of right knee, not elsewhere classified: Secondary | ICD-10-CM | POA: Diagnosis present

## 2015-11-09 NOTE — Therapy (Signed)
[place], [name], [address] Phone: [phone]   Fax:  [phone]  Physical Therapy Evaluation  Patient Details  Name: Mia [name] MRN: [id number] Date of Birth: 10/13/1925 Referring Provider: Jean Rosenthal, MD  Encounter Date: 11/09/2015      PT End of Session - 11/09/15 1516    Visit Number 1   Number of Visits 24   Date for PT Re-Evaluation 01/26/16   Authorization Type UHC Medicare   PT Start Time 1015   PT Stop Time 1100   PT Time Calculation (min) 45 min   Activity Tolerance Patient tolerated treatment well   Behavior During Therapy [facility] for tasks assessed/performed      Past Medical History  Diagnosis Date  . Hypertension   . TIA (transient ischemic attack) none recent  . Former smoker   . H/O ventricular fibrillation 10 yrs ago dx  . Prediabetes   . Chronic kidney disease     chronic kidney diseaase follow by primary md  . Arthritis     oa  . Seasonal allergies   . Diabetes mellitus without complication ([place])     Diet controlled    Past Surgical History  Procedure Laterality Date  . Transthoracic echocardiogram  12/2005    mild DUST, EF=>55%; mild mitral annular calcif; mild-mod aortic root calcif  . Nm myocar perf wall motion  01/2008    lexiscan myoview - normal pattern of perfusion in all regions, EF 92%, no significant ischemia demonstated  . Tonsillectomy  age 9    and adenoids  . Total knee arthroplasty Right 10/06/2015    Procedure: RIGHT TOTAL KNEE ARTHROPLASTY;  Surgeon: [name], MD;  Location: [facility];  Service: Orthopedics;  Laterality: Right;    There were no vitals filed for this visit.       Subjective Assessment - 11/09/15 1035    Subjective She reports she was bone on bone and was painful with difficulty walking so opted for TKA.   She lives independently prior to surgery.   She was using a cane prior to surgery for a couple of years.    Pertinent History SOB   Limitations Standing;Walking;House hold activities   How long can you sit comfortably? As needed   How long can you stand comfortably? 20 seconds   How long can you walk comfortably? 100 feet today but has not walked farther. She has SOB and this limits her. She reorts she was able to grocery shop    Patient Stated Goals To walk wihtout device an be independent.    Return to driving   Currently in Pain? Yes   Pain Score --  1.5   Pain Location Knee   Pain Orientation Right   Pain Descriptors / Indicators Aching;Sore  occasional jolting pain   Pain Type Surgical pain   Pain Onset More than a month ago   Pain Frequency --  episodic   Aggravating Factors  PM worse and wakes.    Pain Relieving Factors ice , medicaiton   Multiple Pain Sites No            [facility] PT Assessment - 11/09/15 1019    Assessment   Medical Diagnosis RT TKA   Referring Provider Jean Rosenthal, MD   Onset Date/Surgical Date 10/06/15   Prior Therapy She reports HHPT  after she was SNF x 5 days then at home for 3 weeks and  stopped 11/07/15   Precautions  Precautions None   Restrictions   Weight Bearing Restrictions Yes   RLE Weight Bearing Weight bearing as tolerated   Balance Screen   Has the patient fallen in the past 6 months No   Has the patient had a decrease in activity level because of a fear of falling?  Yes  now post TKA RT   Is the patient reluctant to leave their home because of a fear of falling?  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 fundraiser Status Within Functional  Limits for tasks assessed   Observation/Other Assessments-Edema    Edema Circumferential   Circumferential Edema   Circumferential - Right 35 cm fulllest part   Circumferential - Left  32 cm    ROM / Strength   AROM / PROM / Strength AROM;PROM;Strength   AROM   AROM Assessment Site Knee   Right/Left Knee Right;Left   Right Knee Extension -28   Right Knee Flexion 103   Left Knee Extension 0   Left Knee Flexion 142   PROM   PROM Assessment Site Knee   Right/Left Knee Right   Right Knee Extension -22   Right Knee Flexion 106   Strength   Strength Assessment Site Knee   Right/Left Knee Right;Left   Right Knee Flexion 4/5  with pain   Right Knee Extension 4/5  with pain   Left Knee Flexion 5/5   Left Knee Extension 5/5   Ambulation/Gait   Gait Comments Walks with RW step through. She report using furniture without walker                           PT Education - 11/09/15 1514    Education provided Yes   Education Details POC, knee extension stretching          PT Short Term Goals - 11/09/15 1525    PT SHORT TERM GOAL #1   Title She will be independent with intal HEP   Time 4   Period Weeks   Status New   PT SHORT TERM GOAL #2   Title She will have active flexion to 115 degrees   Time 4   Status New   PT SHORT TERM GOAL #3   Title She will be able to extend knee actively to -10 degrees for improved stability with walking   Time 4   Period Weeks   Status New   PT SHORT TERM GOAL #4   Title She will walk safely in home wih SPC   Time 4   Period Weeks   Status New   PT SHORT TERM GOAL #5   Title She will report pain decreased 25% or more overall   Time 4   Period Weeks   Status New           PT Long Term Goals - 11/09/15 1528    PT LONG TERM GOAL #1   Title She will be independent with all HEP issued   Time 12   Period Weeks   Status New   PT  LONG TERM GOAL #2   Title She will report pain as intermittant without pain meds     Time 12   Period Weeks   Status New   PT LONG TERM GOAL #3   Title She will walk in home with no device and in community with [facility] safely   Time 12   Period Weeks   Status New   PT LONG TERM GOAL #4   Title She will report doing all normal home tasks with 1-2/10 max pain and no device   Time 12   Period Weeks   Status New   PT LONG TERM GOAL #5   Title She will return to normal community activty   Time 12   Period Weeks   Status New               Plan - Dec 05, 2015 1519    Clinical Impression Statement Ms Durrette presents with for low complexity evaluation post TKA with pain, swelling , stiffness limiting ability to walk and perform normal activity independent ly at home. She should do well  with PT   Rehab Potential Good   PT Frequency 2x / week   PT Duration 12 weeks   PT Treatment/Interventions Cryotherapy;Associate Professor;Therapeutic exercise;Manual techniques;Taping;Passive range of motion;Patient/family education   PT Next Visit Plan Manual for swelling and ROM, modalities for pain ans swelling, gait, quad and hip strengthening   PT Home Exercise Plan nee extension stretching   Consulted and Agree with Plan of Care Patient      Patient will benefit from skilled therapeutic intervention in order to improve the following deficits and impairments:  Decreased range of motion, Difficulty walking, Pain, Decreased activity tolerance, Increased edema, Decreased strength  Visit Diagnosis: Pain in right knee  Stiffness of right knee, not elsewhere classified  Muscle weakness (generalized)  Difficulty in walking, not elsewhere classified      G-Codes - 12/05/2015 1536    Functional Assessment Tool Used FOTO 62% limited   Functional Limitation Mobility: Walking and moving around   Mobility: Walking and Moving Around Current Status [phone]) At least 60 percent but less than 80 percent impaired, limited or restricted   Mobility: Walking and  Moving Around Goal Status [phone]) At least 40 percent but less than 60 percent impaired, limited or restricted       Problem List Patient Active Problem List   Diagnosis Date Noted  . Status post total right knee replacement 10/06/2015  . RBBB 09/06/2015  . Preoperative cardiovascular examination 09/06/2015  . Mild cognitive impairment with memory loss 09/04/2015  . Loss of weight 09/04/2015  . Mild cognitive impairment 07/15/2014  . Primary osteoarthritis of right knee 07/15/2014  . Xerosis cutis 07/15/2014  . Superficial burn of groin 11/24/2013  . B12 deficiency 09/23/2013  . Essential hypertension, benign 09/23/2013  . PVC's (premature ventricular contractions) 09/23/2013  . Vitamin D deficiency 09/23/2013  . Hyperglycemia 09/23/2013  . Osteoarthritis of right knee 09/23/2013  . History of TIAs 08/06/2013  . DOE (dyspnea on exertion) 08/06/2013    [name]  PT 12-05-15, 3:37 PM  [facility] [address] [place], [name], [address] Phone: [phone]   Fax:  [phone]  Name: Mia [name] MRN: [id number] Date of Birth: 1926-03-07

## 2015-11-09 NOTE — Patient Instructions (Signed)
From cabinet issued sitting knee extension stretching with quad set. Many times per day, 5-10 quad sets each time

## 2015-11-14 ENCOUNTER — Ambulatory Visit: Payer: Medicare Other | Admitting: Physical Therapy

## 2015-11-14 ENCOUNTER — Other Ambulatory Visit: Payer: Self-pay | Admitting: Internal Medicine

## 2015-11-14 NOTE — Telephone Encounter (Signed)
Rx request sent to pharmacy.  

## 2015-11-15 ENCOUNTER — Ambulatory Visit: Payer: Medicare Other | Admitting: Physical Therapy

## 2015-11-22 ENCOUNTER — Ambulatory Visit: Payer: Medicare Other | Attending: Orthopaedic Surgery | Admitting: Physical Therapy

## 2015-11-22 DIAGNOSIS — M6281 Muscle weakness (generalized): Secondary | ICD-10-CM

## 2015-11-22 DIAGNOSIS — M25661 Stiffness of right knee, not elsewhere classified: Secondary | ICD-10-CM | POA: Insufficient documentation

## 2015-11-22 DIAGNOSIS — R262 Difficulty in walking, not elsewhere classified: Secondary | ICD-10-CM | POA: Insufficient documentation

## 2015-11-22 DIAGNOSIS — M25561 Pain in right knee: Secondary | ICD-10-CM | POA: Diagnosis not present

## 2015-11-22 NOTE — Therapy (Signed)
[place], [name], [address] Phone: [phone]   Fax:  [phone]  Physical Therapy Treatment  Patient Details  Name: Mia [name] MRN: [id number] Date of Birth: 1926-04-01 Referring Provider: Jean Rosenthal, MD  Encounter Date: 11/22/2015      PT End of Session - 11/22/15 1202    Visit Number 2   Number of Visits 24   Date for PT Re-Evaluation 01/26/16   Authorization Type UHC Medicare   Authorization Time Period KX at visit 15   PT Start Time 1153   PT Stop Time 1231   PT Time Calculation (min) 38 min      Past Medical History  Diagnosis Date  . Hypertension   . TIA (transient ischemic attack) none recent  . Former smoker   . H/O ventricular fibrillation 10 yrs ago dx  . Prediabetes   . Chronic kidney disease     chronic kidney diseaase follow by primary md  . Arthritis     oa  . Seasonal allergies   . Diabetes mellitus without complication ([place])     Diet controlled    Past Surgical History  Procedure Laterality Date  . Transthoracic echocardiogram  12/2005    mild DUST, EF=>55%; mild mitral annular calcif; mild-mod aortic root calcif  . Nm myocar perf wall motion  01/2008    lexiscan myoview - normal pattern of perfusion in all regions, EF 92%, no significant ischemia demonstated  . Tonsillectomy  age 38    and adenoids  . Total knee arthroplasty Right 10/06/2015    Procedure: RIGHT TOTAL KNEE ARTHROPLASTY;  Surgeon: [name], MD;  Location: [facility];  Service: Orthopedics;  Laterality: Right;    There were no vitals filed for this visit.      Subjective Assessment - 11/22/15 1201    Subjective I have not felt well.    Currently in Pain? No/denies   Aggravating Factors  turning over in the bed at night   Pain Relieving Factors ice, meds            [facility] PT Assessment - 11/22/15 0001    AROM   Right Knee Extension -22   Right Knee Flexion 107   PROM   Right Knee Extension -22                     [facility] Adult PT Treatment/Exercise - 11/22/15 0001    Exercises   Exercises Knee/Hip   Knee/Hip Exercises: Stretches   Active Hamstring Stretch 3 reps;30 seconds   Active Hamstring Stretch Limitations long sitting single leg   Knee/Hip Exercises: Aerobic   Nustep L4 x 5 min LE   Knee/Hip Exercises: Seated   Long Arc Quad 2 sets;10 reps   Marching 2 sets;5 reps   Knee/Hip Exercises: Supine   Quad Sets 10 reps   Heel Slides 10 reps   Straight Leg Raises 10 reps                  PT Short Term Goals - 11/22/15 1246    PT SHORT TERM GOAL #1   Title She will be independent with intal HEP   Period Weeks   Status On-going   PT SHORT TERM GOAL #2   Title She will have active flexion to 115 degrees   Time 4   Status On-going   PT SHORT TERM GOAL #3   Title She will be able to extend knee  actively to -10 degrees for improved stability with walking   Time 4   Period Weeks   Status On-going   PT SHORT TERM GOAL #4   Title She will walk safely in home wih SPC   Time 4   Period Weeks   Status On-going   PT SHORT TERM GOAL #5   Title She will report pain decreased 25% or more overall   Time 4   Period Weeks   Status On-going           PT Long Term Goals - 11/09/15 1528    PT LONG TERM GOAL #1   Title She will be independent with all HEP issued   Time 12   Period Weeks   Status New   PT LONG TERM GOAL #2   Title She will report pain as intermittant without pain meds    Time 12   Period Weeks   Status New   PT LONG TERM GOAL #3   Title She will walk in home with no device and in community with [facility] safely   Time 12   Period Weeks   Status New   PT LONG TERM GOAL #4   Title She will report doing all normal home tasks with 1-2/10 max pain and no device   Time 12   Period Weeks   Status New   PT LONG TERM GOAL #5   Title She will return to normal community activty   Time 12   Period Weeks   Status  New               Plan - 11/22/15 1428    Clinical Impression Statement Instructed pt in low level knee exercises and updated HEP. Pt reports she has not been feeling well and is seeing her doctor about other issues. She required several rest breaks between exercises. She reports increased pain behind knee with quad sets however felt better after hamstring stretching.    PT Next Visit Plan Manual for swelling and ROM, modalities for pain ans swelling, gait, quad and hip strengthening   PT Home Exercise Plan knee extension stretching with quad set, hamstring stretch, LAQ, heel lide, SLR      Patient will benefit from skilled therapeutic intervention in order to improve the following deficits and impairments:     Visit Diagnosis: Pain in right knee  Stiffness of right knee, not elsewhere classified  Muscle weakness (generalized)  Difficulty in walking, not elsewhere classified     Problem List Patient Active Problem List   Diagnosis Date Noted  . Status post total right knee replacement 10/06/2015  . RBBB 09/06/2015  . Preoperative cardiovascular examination 09/06/2015  . Mild cognitive impairment with memory loss 09/04/2015  . Loss of weight 09/04/2015  . Mild cognitive impairment 07/15/2014  . Primary osteoarthritis of right knee 07/15/2014  . Xerosis cutis 07/15/2014  . Superficial burn of groin 11/24/2013  . B12 deficiency 09/23/2013  . Essential hypertension, benign 09/23/2013  . PVC's (premature ventricular contractions) 09/23/2013  . Vitamin D deficiency 09/23/2013  . Hyperglycemia 09/23/2013  . Osteoarthritis of right knee 09/23/2013  . History of TIAs 08/06/2013  . DOE (dyspnea on exertion) 08/06/2013    Dorene Ar, PTA 11/22/2015, 2:38 PM  [facility] [address] [place], [name], [address] Phone: [phone]   Fax:  [phone]  Name: Mia [name] MRN: [id number] Date of Birth:  Dec 25, 1925

## 2015-11-22 NOTE — Patient Instructions (Addendum)
Quad Set    With other leg bent, foot flat, slowly tighten muscles on thigh of straight leg while counting out loud to __5__.  Repeat _10-20___ times. Do ___2_ sessions per day.    Straight Leg Raise    Tighten muscle on thigh  and slowly raise right leg __12__ inches from floor. DO NOT LET THE KNEE BEND. Repeat __10__ times per set. Do __1-2__ sets per session. Do __2__ sessions per day.  Heel Slide    Bend knee and pull heel toward buttocks. Hold __5__ seconds. Return. Repeat _10___ times. Do __2__ sessions per day.  http://gt2.exer.us/372   Copyright  VHI. All rights reserved.    Long [employer]    Straighten operated leg and try to hold it __5__ seconds.  Repeat __10-20__ times. Do _2___ sessions a day.

## 2015-11-23 ENCOUNTER — Other Ambulatory Visit: Payer: Self-pay

## 2015-11-23 DIAGNOSIS — N289 Disorder of kidney and ureter, unspecified: Secondary | ICD-10-CM

## 2015-11-24 ENCOUNTER — Other Ambulatory Visit: Payer: Medicare Other

## 2015-11-24 DIAGNOSIS — N289 Disorder of kidney and ureter, unspecified: Secondary | ICD-10-CM

## 2015-11-27 ENCOUNTER — Ambulatory Visit: Payer: Medicare Other | Admitting: Physical Therapy

## 2015-11-27 ENCOUNTER — Encounter: Payer: Self-pay | Admitting: Nurse Practitioner

## 2015-11-27 ENCOUNTER — Ambulatory Visit (INDEPENDENT_AMBULATORY_CARE_PROVIDER_SITE_OTHER): Payer: Medicare Other | Admitting: Nurse Practitioner

## 2015-11-27 VITALS — BP 112/68 | HR 91 | Temp 97.5°F | Resp 17 | Ht 67.0 in | Wt 111.4 lb

## 2015-11-27 DIAGNOSIS — R262 Difficulty in walking, not elsewhere classified: Secondary | ICD-10-CM

## 2015-11-27 DIAGNOSIS — I10 Essential (primary) hypertension: Secondary | ICD-10-CM

## 2015-11-27 DIAGNOSIS — R634 Abnormal weight loss: Secondary | ICD-10-CM | POA: Diagnosis not present

## 2015-11-27 DIAGNOSIS — M25661 Stiffness of right knee, not elsewhere classified: Secondary | ICD-10-CM

## 2015-11-27 DIAGNOSIS — M6281 Muscle weakness (generalized): Secondary | ICD-10-CM

## 2015-11-27 DIAGNOSIS — M25561 Pain in right knee: Secondary | ICD-10-CM

## 2015-11-27 LAB — BASIC METABOLIC PANEL WITH GFR
BUN: 37 mg/dL — ABNORMAL HIGH (ref 7–25)
CO2: 24 mmol/L (ref 20–31)
Calcium: 9.9 mg/dL (ref 8.6–10.4)
Chloride: 103 mmol/L (ref 98–110)
Creat: 1.77 mg/dL — ABNORMAL HIGH (ref 0.60–0.88)
GFR, Est African American: 29 mL/min — ABNORMAL LOW (ref >=60)
GFR, Est Non African American: 25 mL/min — ABNORMAL LOW (ref >=60)
Glucose, Bld: 101 mg/dL — ABNORMAL HIGH (ref 65–99)
Potassium: 3.6 mmol/L (ref 3.5–5.3)
Sodium: 140 mmol/L (ref 135–146)

## 2015-11-27 NOTE — Therapy (Signed)
Mia [name], [name], [address] Phone: [phone]   Fax:  [phone]  Physical Therapy Treatment  Patient Details  Name: Mia [name] MRN: [id number] Date of Birth: Jan 26, 1926 Referring Provider: Jean Rosenthal, MD  Encounter Date: 11/27/2015      PT End of Session - 11/27/15 1214    Visit Number 3   Number of Visits 24   Date for PT Re-Evaluation 01/26/16   Authorization Type UHC Medicare   PT Start Time 1100   PT Stop Time 1149   PT Time Calculation (min) 49 min   Activity Tolerance Patient tolerated treatment well   Behavior During Therapy [facility] for tasks assessed/performed      Past Medical History  Diagnosis Date  . Hypertension   . TIA (transient ischemic attack) none recent  . Former smoker   . H/O ventricular fibrillation 10 yrs ago dx  . Prediabetes   . Chronic kidney disease     chronic kidney diseaase follow by primary md  . Arthritis     oa  . Seasonal allergies   . Diabetes mellitus without complication ([name])     Diet controlled    Past Surgical History  Procedure Laterality Date  . Transthoracic echocardiogram  12/2005    mild DUST, EF=>55%; mild mitral annular calcif; mild-mod aortic root calcif  . Nm myocar perf wall motion  01/2008    lexiscan myoview - normal pattern of perfusion in all regions, EF 92%, no significant ischemia demonstated  . Tonsillectomy  age 55    and adenoids  . Total knee arthroplasty Right 10/06/2015    Procedure: RIGHT TOTAL KNEE ARTHROPLASTY;  Surgeon: Mia Rossetti, MD;  Location: [facility];  Service: Orthopedics;  Laterality: Right;    There were no vitals filed for this visit.      Subjective Assessment - 11/27/15 1056    Subjective "the knee is doing pretty well"   Currently in Pain? No/denies            [facility] PT Assessment - 11/27/15 1121    AROM   Right Knee Extension -14   Right Knee Flexion 107  initally 104                      [facility] Adult PT Treatment/Exercise - 11/27/15 1105    Knee/Hip Exercises: Stretches   Active Hamstring Stretch 3 reps;30 seconds   Active Hamstring Stretch Limitations contract/ relax with 10 sec contracttion   Hip Flexor Stretch 2 reps;30 seconds   Knee/Hip Exercises: Aerobic   Nustep L4 x 8 min  with UE   Knee/Hip Exercises: Seated   Long Arc Quad AROM;2 sets;10 reps   Heel Slides AROM;Strengthening;Both;2 sets;10 reps   Knee/Hip Exercises: Supine   Straight Leg Raises AROM;Strengthening;Right;2 sets;10 reps  with sustained   Other Supine Knee/Hip Exercises clamshells 2 x 10   red theraband   Modalities   Modalities Moist Heat   Moist Heat Therapy   Number Minutes Moist Heat 10 Minutes   Moist Heat Location Knee  L with elevation (2 pillow cases   Manual Therapy   Manual Therapy --   Joint Mobilization R Tibiofemoral Grade 2 A>P 5 x 30 sec oscillations (increasing knee flexion between each set)                  PT Short Term Goals - 11/22/15 1246    PT SHORT TERM GOAL #1  Title She will be independent with intal HEP   Period Weeks   Status On-going   PT SHORT TERM GOAL #2   Title She will have active flexion to 115 degrees   Time 4   Status On-going   PT SHORT TERM GOAL #3   Title She will be able to extend knee actively to -10 degrees for improved stability with walking   Time 4   Period Weeks   Status On-going   PT SHORT TERM GOAL #4   Title She will walk safely in home wih SPC   Time 4   Period Weeks   Status On-going   PT SHORT TERM GOAL #5   Title She will report pain decreased 25% or more overall   Time 4   Period Weeks   Status On-going           PT Long Term Goals - 11/09/15 1528    PT LONG TERM GOAL #1   Title She will be independent with all HEP issued   Time 12   Period Weeks   Status New   PT LONG TERM GOAL #2   Title She will report pain as intermittant without pain meds    Time 12   Period  Weeks   Status New   PT LONG TERM GOAL #3   Title She will walk in home with no device and in community with [facility] safely   Time 12   Period Weeks   Status New   PT LONG TERM GOAL #4   Title She will report doing all normal home tasks with 1-2/10 max pain and no device   Time 12   Period Weeks   Status New   PT LONG TERM GOAL #5   Title She will return to normal community activty   Time 12   Period Weeks   Status New               Plan - 11/27/15 1215    Clinical Impression Statement Mia [name] reports no pain just that she fatigues quickly. pt did require multiple rest breaks throughout session. initally knee flexion was 104 but following mobs she improved to 107 degrees. she reported some soreness following todays session and request heat to calm down soreness which she reported helped alot.    PT Next Visit Plan assess benefit of MHP post session. Manual for swelling and ROM, modalities for pain ans swelling, gait, quad and hip strengthening   Consulted and Agree with Plan of Care Patient      Patient will benefit from skilled therapeutic intervention in order to improve the following deficits and impairments:  Decreased range of motion, Difficulty walking, Pain, Decreased activity tolerance, Increased edema, Decreased strength  Visit Diagnosis: Pain in right knee  Stiffness of right knee, not elsewhere classified  Muscle weakness (generalized)  Difficulty in walking, not elsewhere classified     Problem List Patient Active Problem List   Diagnosis Date Noted  . Status post total right knee replacement 10/06/2015  . RBBB 09/06/2015  . Preoperative cardiovascular examination 09/06/2015  . Mild cognitive impairment with memory loss 09/04/2015  . Loss of weight 09/04/2015  . Mild cognitive impairment 07/15/2014  . Primary osteoarthritis of right knee 07/15/2014  . Xerosis cutis 07/15/2014  . Superficial burn of groin 11/24/2013  . B12 deficiency 09/23/2013   . Essential hypertension, benign 09/23/2013  . PVC's (premature ventricular contractions) 09/23/2013  . Vitamin D deficiency 09/23/2013  .  Hyperglycemia 09/23/2013  . Osteoarthritis of right knee 09/23/2013  . History of TIAs 08/06/2013  . DOE (dyspnea on exertion) 08/06/2013   Mia [name] PT, DPT, LAT, ATC  11/27/2015  12:44 PM      [facility] [address] [place], [name], [address] Phone: [phone]   Fax:  [phone]  Name: Mia [name] MRN: [id number] Date of Birth: 05/03/1926

## 2015-11-27 NOTE — Patient Instructions (Signed)
Stop triamterene- hctz Take blood pressure at home daily- 30-1 hour after medication Record and bring to next visit.  Follow up in 2 weeks for blood pressure with blood pressure readings   Also make appt for October with Dr Mariea Clonts for medication management

## 2015-11-27 NOTE — Progress Notes (Signed)
Patient ID: Mia [name], female   DOB: 07/15/1925, 82 y.o.   MRN: [id number]    PCP: Hollace Kinnier, DO  Advanced Directive information Does patient have an advance directive?: Yes, Type of Advance Directive: Healthcare Power of Attorney  No Known Allergies  Chief Complaint  Patient presents with  . Medical Management of Chronic Issues    4 week follow up to discuss lab work. Labs printed     HPI: Patient is a 82 y.o. female seen in the office today for follow up. Pt was started on Remeron at last visit and maxzide decreased to 1/2 tablet.  Pt could not tolerate Remeron, made her too sleepy. Has decided she will take boost, found some she liked. Low sugar. Keeps forgetting to drink it though.  Pt has gained 2 lbs from last visit.   Taking decreased dose of triamterene-hctz of 37.5-25 mg  Blood pressure tolerating medication change. Not taking at home routinely  No increase in swelling.   Review of Systems:  Review of Systems  Constitutional: Negative for fever and chills.       Reports good appetite today  HENT: Negative for congestion.   Eyes:       Glasses  Respiratory: Positive for shortness of breath (with exertion).   Cardiovascular: Positive for palpitations (at times). Negative for chest pain and leg swelling.  Gastrointestinal: Negative for abdominal pain, diarrhea and constipation.  Genitourinary: Negative for dysuria.  Musculoskeletal:       S/p TKA of the right knee, no pain noted   Skin: Negative for rash.  Neurological: Negative for dizziness and weakness.  Psychiatric/Behavioral: Negative for suicidal ideas.    Past Medical History  Diagnosis Date  . Hypertension   . TIA (transient ischemic attack) none recent  . Former smoker   . H/O ventricular fibrillation 10 yrs ago dx  . Prediabetes   . Chronic kidney disease     chronic kidney diseaase follow by primary md  . Arthritis     oa  . Seasonal allergies   . Diabetes mellitus without complication  ([place])     Diet controlled   Past Surgical History  Procedure Laterality Date  . Transthoracic echocardiogram  12/2005    mild DUST, EF=>55%; mild mitral annular calcif; mild-mod aortic root calcif  . Nm myocar perf wall motion  01/2008    lexiscan myoview - normal pattern of perfusion in all regions, EF 92%, no significant ischemia demonstated  . Tonsillectomy  age 38    and adenoids  . Total knee arthroplasty Right 10/06/2015    Procedure: RIGHT TOTAL KNEE ARTHROPLASTY;  Surgeon: [name], MD;  Location: [facility];  Service: Orthopedics;  Laterality: Right;   Social History:   reports that she quit smoking about 15 years ago. Her smoking use included Cigarettes. She has a 10 pack-year smoking history. She has never used smokeless tobacco. She reports that she drinks about 4.2 oz of alcohol per week. She reports that she does not use illicit drugs.  Family History  Problem Relation Age of Onset  . Lung cancer Brother   . Congestive Heart Failure Mother   . Prostate cancer Son   . Bipolar disorder Son   . Heart disease Son     Medications: Patient's Medications  New Prescriptions   No medications on file  Previous Medications   AMLODIPINE (NORVASC) 5 MG TABLET    take 1 tablet by mouth once daily   CHOLECALCIFEROL (VITAMIN D) 1000 UNITS  TABLET    Take 1,000 Units by mouth daily. D3   CLOPIDOGREL (PLAVIX) 75 MG TABLET    take 1 tablet by mouth once daily   FOLIC ACID-VIT Q000111Q [id number] (FOLBEE) 2.5-25-1 MG TABS TABLET    Take 1 tablet by mouth daily.   KETOROLAC (ACULAR) 0.5 % OPHTHALMIC SOLUTION    Place 1 drop into the right eye 2 (two) times daily.   METHOCARBAMOL (ROBAXIN) 500 MG TABLET    Take 1 tablet (500 mg total) by mouth every 6 (six) hours as needed for muscle spasms.   METOPROLOL SUCCINATE (TOPROL-XL) 50 MG 24 HR TABLET    take 1 tablet by mouth once daily   SACCHAROMYCES BOULARDII (FLORASTOR) 250 MG CAPSULE    Take 250 mg by mouth daily. Reported on 11/09/2015    TRIAMTERENE-HYDROCHLOROTHIAZIDE (MAXZIDE-25) 37.5-25 MG TABLET    Take 1 tablet by mouth daily.  Modified Medications   No medications on file  Discontinued Medications   CETIRIZINE (ZYRTEC) 10 MG TABLET    Take 10 mg by mouth daily. Reported on 11/09/2015   MIRTAZAPINE (REMERON) 7.5 MG TABLET    Take 1 tablet (7.5 mg total) by mouth at bedtime.     Physical Exam:  Filed Vitals:   11/27/15 1319  BP: 112/68  Pulse: 91  Temp: 97.5 F (36.4 C)  TempSrc: Oral  Resp: 17  Height: 5\' 7"  (1.702 m)  Weight: 111 lb 6.4 oz (50.531 kg)  SpO2: 90%   Body mass index is 17.44 kg/(m^2).  Physical Exam  Constitutional: She is oriented to person, place, and time. No distress.  Thin tall black female  Neck: Normal range of motion. Neck supple.  Cardiovascular: Normal rate, regular rhythm and normal heart sounds.   Pulmonary/Chest: Effort normal and breath sounds normal. No respiratory distress.  Abdominal: Soft. Bowel sounds are normal.  Musculoskeletal: She exhibits no edema or tenderness.  Slow gait, small steps, using cane for support  Neurological: She is alert and oriented to person, place, and time.  Skin: Skin is warm and dry.  Psychiatric: She has a normal mood and affect.  Mild memory loss noted     Labs reviewed: Basic Metabolic Panel:  Recent Labs  01/13/15 1130  09/04/15 0901  10/07/15 0410 10/12/15 11/03/15 1334 11/24/15 1121  NA 142  < > 142  < > 140 144 138 140  K 3.8  < > 3.8  < > 2.9* 3.7 4.1 3.6  CL 99  < > 98  < > 99*  --  96 103  CO2 25  < > 26  < > 29  --  23 24  GLUCOSE 113*  < > 102*  < > 144*  --  181* 101*  BUN 29*  < > 36*  < > 26* 36* 40* 37*  CREATININE 1.73*  < > 1.96*  < > 1.49* 1.7* 2.24* 1.77*  CALCIUM 10.3  < > 10.4*  < > 9.4  --  10.5* 9.9  TSH 2.080  --  2.770  --   --   --   --   --   < > = values in this interval not displayed. Liver Function Tests:  Recent Labs  02/27/15 1150 05/08/15 0910 09/04/15 0901 10/12/15  AST 21 22 23 25     ALT 13 13 12 23   ALKPHOS 56 55 51 59  BILITOT 0.4 0.4 0.6  --   PROT 6.7 6.1 6.5  --   ALBUMIN 4.3 4.1  4.3  --    No results for input(s): LIPASE, AMYLASE in the last 8760 hours. No results for input(s): AMMONIA in the last 8760 hours. CBC:  Recent Labs  09/04/15 0901  09/29/15 0930 10/07/15 0410 10/12/15 11/03/15 1334  WBC 5.6  < > 7.2 9.7 8.3 6.9  NEUTROABS 4.4  --   --   --  7 5.2  HGB  --   --  14.0 11.9* 10.0*  --   HCT 44.2  < > 40.9 36.0 31* 34.6  MCV 94  --  90.5 92.1  --  92  PLT 217  < > 215 239 327 293  < > = values in this interval not displayed. Lipid Panel:  Recent Labs  01/13/15 1130 05/08/15 0910 09/04/15 0901  CHOL 163 156 151  HDL 81 78 85  LDLCALC 59 59 53  TRIG 115 97 66  CHOLHDL 2.0 2.0 1.8   TSH:  Recent Labs  01/13/15 1130 09/04/15 0901  TSH 2.080 2.770   A1C: Lab Results  Component Value Date   HGBA1C 6.5* 09/04/2015     Assessment/Plan 1. Essential hypertension, benign -pt has reduced dose of triamterene-hctz. Blood pressure tolerating well. No edema noted. BUN/Cr reviewed with pt and has improved from previous.  -blood pressure remains in good range. Will stop medication at this time and have pt take blood pressure at home. To notify if blood pressure becomes elevated  2. Loss of weight -did not tolerate remeron  -has found nutritional supplement that she can tolerate, to take daily as supplement not meal replacement Will follow weight  To follow up in 2 weeks on blood pressure, sooner if needed    Seline Enzor K. Harle Battiest  [facility] [phone] 8 am - 5 pm) [phone] (after hours)

## 2015-11-29 ENCOUNTER — Ambulatory Visit: Payer: Medicare Other | Admitting: Physical Therapy

## 2015-11-29 DIAGNOSIS — M6281 Muscle weakness (generalized): Secondary | ICD-10-CM

## 2015-11-29 DIAGNOSIS — M25561 Pain in right knee: Secondary | ICD-10-CM

## 2015-11-29 DIAGNOSIS — M25661 Stiffness of right knee, not elsewhere classified: Secondary | ICD-10-CM

## 2015-11-29 DIAGNOSIS — R262 Difficulty in walking, not elsewhere classified: Secondary | ICD-10-CM

## 2015-11-29 NOTE — Therapy (Signed)
[place], [name], [address] Phone: [phone]   Fax:  [phone]  Physical Therapy Treatment  Patient Details  Name: Mia [name] MRN: [id number] Date of Birth: July 29, 1925 Referring Provider: Jean Rosenthal, MD  Encounter Date: 11/29/2015      PT End of Session - 11/29/15 1242    Visit Number 4   Number of Visits 24   Date for PT Re-Evaluation 01/26/16   Authorization Type UHC Medicare   Authorization Time Period KX at visit 15   PT Start Time 1145   PT Stop Time 1240   PT Time Calculation (min) 55 min      Past Medical History  Diagnosis Date  . Hypertension   . TIA (transient ischemic attack) none recent  . Former smoker   . H/O ventricular fibrillation 10 yrs ago dx  . Prediabetes   . Chronic kidney disease     chronic kidney diseaase follow by primary md  . Arthritis     oa  . Seasonal allergies   . Diabetes mellitus without complication ([place])     Diet controlled    Past Surgical History  Procedure Laterality Date  . Transthoracic echocardiogram  12/2005    mild DUST, EF=>55%; mild mitral annular calcif; mild-mod aortic root calcif  . Nm myocar perf wall motion  01/2008    lexiscan myoview - normal pattern of perfusion in all regions, EF 92%, no significant ischemia demonstated  . Tonsillectomy  age 24    and adenoids  . Total knee arthroplasty Right 10/06/2015    Procedure: RIGHT TOTAL KNEE ARTHROPLASTY;  Surgeon: [name], MD;  Location: [facility];  Service: Orthopedics;  Laterality: Right;    There were no vitals filed for this visit.      Subjective Assessment - 11/29/15 1244    Subjective I have been sore all over   Currently in Pain? No/denies            [facility] PT Assessment - 11/29/15 0001    AROM   Right Knee Extension -10  after   Right Knee Flexion 105  after                     [facility] Adult PT Treatment/Exercise - 11/29/15  0001    Knee/Hip Exercises: Stretches   Active Hamstring Stretch 3 reps;30 seconds   Active Hamstring Stretch Limitations long sitting single leg   Gastroc Stretch 2 reps;30 seconds  runners Conservation officer, nature Limitations slant board stretch 3 x 30 [place]   Knee/Hip Exercises: Aerobic   Nustep L4 x 10 min  Without UE   Knee/Hip Exercises: Seated   Long Arc Quad AROM;2 sets;10 reps   Other Seated Knee/Hip Exercises quad set with heel on floor x10   Marching 2 sets;10 reps   Knee/Hip Exercises: Supine   Quad Sets 10 reps   Terminal Knee Extension 10 reps   Moist Heat Therapy   Moist Heat Location Knee  L with elevation (2 pillow cases   Manual Therapy   Joint Mobilization Right knee extension mobs supine, patella mobs                PT Education - 11/29/15 1227    Education provided Yes   Education Details knee to chest    Person(s) Educated Patient   Methods Explanation;Handout   Comprehension Verbalized understanding          PT Short  Term Goals - 11/22/15 1246    PT SHORT TERM GOAL #1   Title She will be independent with intal HEP   Period Weeks   Status On-going   PT SHORT TERM GOAL #2   Title She will have active flexion to 115 degrees   Time 4   Status On-going   PT SHORT TERM GOAL #3   Title She will be able to extend knee actively to -10 degrees for improved stability with walking   Time 4   Period Weeks   Status On-going   PT SHORT TERM GOAL #4   Title She will walk safely in home wih SPC   Time 4   Period Weeks   Status On-going   PT SHORT TERM GOAL #5   Title She will report pain decreased 25% or more overall   Time 4   Period Weeks   Status On-going           PT Long Term Goals - 11/09/15 1528    PT LONG TERM GOAL #1   Title She will be independent with all HEP issued   Time 12   Period Weeks   Status New   PT LONG TERM GOAL #2   Title She will report pain as intermittant without pain meds    Time 12   Period Weeks    Status New   PT LONG TERM GOAL #3   Title She will walk in home with no device and in community with [facility] safely   Time 12   Period Weeks   Status New   PT LONG TERM GOAL #4   Title She will report doing all normal home tasks with 1-2/10 max pain and no device   Time 12   Period Weeks   Status New   PT LONG TERM GOAL #5   Title She will return to normal community activty   Time 12   Period Weeks   Status New               Plan - 11/29/15 1233    Clinical Impression Statement Pt reports soreness after last visit. No knee pain. Stands with right knee flexed using SPC. Review of HEP including seated quad sets and hamstring stretch. Began calf stretching via runners stretch and slant board with pt reporting signifant stretch felt. Worked on knee extension with over pressure. Pt feels posterior knee stretch and pain with this. Added knee to chest stretch for knee flexion and added to HEP. Repeated HMP for stiffness and flexibility.    PT Next Visit Plan review knee to chest stretch. add calf stretch for HEP; continue to reinforce HEP. Manual for swelling and ROM, modalities for pain ans swelling, gait, quad and hip strengthening      Patient will benefit from skilled therapeutic intervention in order to improve the following deficits and impairments:  Decreased range of motion, Difficulty walking, Pain, Decreased activity tolerance, Increased edema, Decreased strength  Visit Diagnosis: Pain in right knee  Stiffness of right knee, not elsewhere classified  Muscle weakness (generalized)  Difficulty in walking, not elsewhere classified     Problem List Patient Active Problem List   Diagnosis Date Noted  . Status post total right knee replacement 10/06/2015  . RBBB 09/06/2015  . Preoperative cardiovascular examination 09/06/2015  . Mild cognitive impairment with memory loss 09/04/2015  . Loss of weight 09/04/2015  . Mild cognitive impairment 07/15/2014  . Primary  osteoarthritis of right knee 07/15/2014  .  Xerosis cutis 07/15/2014  . Superficial burn of groin 11/24/2013  . B12 deficiency 09/23/2013  . Essential hypertension, benign 09/23/2013  . PVC's (premature ventricular contractions) 09/23/2013  . Vitamin D deficiency 09/23/2013  . Hyperglycemia 09/23/2013  . Osteoarthritis of right knee 09/23/2013  . History of TIAs 08/06/2013  . DOE (dyspnea on exertion) 08/06/2013    Dorene Ar, PTA 11/29/2015, 12:49 PM  [facility] [address] [place], [name], [address] Phone: [phone]   Fax:  [phone]  Name: Mia [name] MRN: [id number] Date of Birth: 12/20/1925

## 2015-11-29 NOTE — Patient Instructions (Signed)
Knee to Chest    Lying supine, bend right knee to chest and hold 30 seconds. Repeat 3 times.  Do _2__ times per day.  Copyright  VHI. All rights reserved.

## 2015-12-04 ENCOUNTER — Ambulatory Visit: Payer: Medicare Other | Admitting: Physical Therapy

## 2015-12-04 DIAGNOSIS — M6281 Muscle weakness (generalized): Secondary | ICD-10-CM

## 2015-12-04 DIAGNOSIS — R262 Difficulty in walking, not elsewhere classified: Secondary | ICD-10-CM

## 2015-12-04 DIAGNOSIS — M25561 Pain in right knee: Secondary | ICD-10-CM | POA: Diagnosis not present

## 2015-12-04 DIAGNOSIS — M25661 Stiffness of right knee, not elsewhere classified: Secondary | ICD-10-CM

## 2015-12-04 NOTE — Patient Instructions (Signed)
Gastroc / Heel Cord Stretch - Seated With Towel    Sit on floor, towel around ball of foot. Gently pull foot in toward body, stretching heel cord and calf. Hold for _30__ seconds.  Repeat _3__ times. Do __2_ times per day.  Copyright  VHI. All rights reserved.

## 2015-12-04 NOTE — Therapy (Signed)
[place]ia [name], [name], [address] Phone: [phone]   Fax:  [phone]  Physical Therapy Treatment  Patient Details  Name: Mia [name] MRN: [id number] Date of Birth: 07/02/1925 Referring Provider: Jean Rosenthal, MD  Encounter Date: 12/04/2015      PT End of Session - 12/04/15 1151    Visit Number 5   Number of Visits 24   Date for PT Re-Evaluation 01/26/16   Authorization Type UHC Medicare   Authorization Time Period KX at visit 15   PT Start Time 1145   PT Stop Time 1245   PT Time Calculation (min) 60 min      Past Medical History  Diagnosis Date  . Hypertension   . TIA (transient ischemic attack) none recent  . Former smoker   . H/O ventricular fibrillation 10 yrs ago dx  . Prediabetes   . Chronic kidney disease     chronic kidney diseaase follow by primary md  . Arthritis     oa  . Seasonal allergies   . Diabetes mellitus without complication ([place])     Diet controlled    Past Surgical History  Procedure Laterality Date  . Transthoracic echocardiogram  12/2005    mild DUST, EF=>55%; mild mitral annular calcif; mild-mod aortic root calcif  . Nm myocar perf wall motion  01/2008    lexiscan myoview - normal pattern of perfusion in all regions, EF 92%, no significant ischemia demonstated  . Tonsillectomy  age 44    and adenoids  . Total knee arthroplasty Right 10/06/2015    Procedure: RIGHT TOTAL KNEE ARTHROPLASTY;  Surgeon: [name], MD;  Location: [facility];  Service: Orthopedics;  Laterality: Right;    There were no vitals filed for this visit.          [facility] PT Assessment - 12/04/15 0001    AROM   Right Knee Extension -14   Right Knee Flexion 106                     [facility] Adult PT Treatment/Exercise - 12/04/15 0001    Knee/Hip Exercises: Stretches   Active Hamstring Stretch 3 reps;30 seconds   Active Hamstring Stretch Limitations long sitting  single leg   Passive Hamstring Stretch Limitations with contract relax x 2   Gastroc Stretch 2 reps;30 seconds  runners stretch   Gastroc Stretch Limitations supine with strap, also runners stretch   Soleus Stretch 2 reps;20 seconds   Knee/Hip Exercises: Aerobic   Nustep L4 x 6 min  Without UE   Knee/Hip Exercises: Seated   Long Arc Quad 2 sets;20 reps   Long Arc Quad Weight 2 lbs.   Other Seated Knee/Hip Exercises quad set with heel on floor x10   Marching 2 sets;10 reps   Knee/Hip Exercises: Supine   Quad Sets 10 reps   Straight Leg Raises 10 reps   Straight Leg Raises Limitations with initial quad set   Moist Heat Therapy   Number Minutes Moist Heat 10 Minutes   Moist Heat Location Knee  L with elevation (2 pillow cases)   Manual Therapy   Joint Mobilization Right knee extension mobs supine, patella mobs                PT Education - 12/04/15 1227    Education provided Yes   Education Details seated calf stretch with towel   Person(s) Educated Patient   Methods Explanation;Handout   Comprehension Verbalized  understanding          PT Short Term Goals - 11/22/15 1246    PT SHORT TERM GOAL #1   Title She will be independent with intal HEP   Period Weeks   Status On-going   PT SHORT TERM GOAL #2   Title She will have active flexion to 115 degrees   Time 4   Status On-going   PT SHORT TERM GOAL #3   Title She will be able to extend knee actively to -10 degrees for improved stability with walking   Time 4   Period Weeks   Status On-going   PT SHORT TERM GOAL #4   Title She will walk safely in home wih SPC   Time 4   Period Weeks   Status On-going   PT SHORT TERM GOAL #5   Title She will report pain decreased 25% or more overall   Time 4   Period Weeks   Status On-going           PT Long Term Goals - 11/09/15 1528    PT LONG TERM GOAL #1   Title She will be independent with all HEP issued   Time 12   Period Weeks   Status New   PT LONG  TERM GOAL #2   Title She will report pain as intermittant without pain meds    Time 12   Period Weeks   Status New   PT LONG TERM GOAL #3   Title She will walk in home with no device and in community with [facility] safely   Time 12   Period Weeks   Status New   PT LONG TERM GOAL #4   Title She will report doing all normal home tasks with 1-2/10 max pain and no device   Time 12   Period Weeks   Status New   PT LONG TERM GOAL #5   Title She will return to normal community activty   Time 12   Period Weeks   Status New               Plan - 12/04/15 1406    Clinical Impression Statement Continues to lack knee extension. Pain posterior knee with extension exercises and mobilizations. Continued gentle mobilizations, added calf stretch HEP. Visually extension improved after therex. She is walking at home using [facility] intermittently. She reports no difficulty with home tasks. Progressing toward STGs.    PT Next Visit Plan review knee to chest stretch. add calf stretch for HEP; continue to reinforce HEP. Manual for swelling and ROM, modalities for pain ans swelling, gait, quad and hip strengthening      Patient will benefit from skilled therapeutic intervention in order to improve the following deficits and impairments:  Decreased range of motion, Difficulty walking, Pain, Decreased activity tolerance, Increased edema, Decreased strength  Visit Diagnosis: Pain in right knee  Stiffness of right knee, not elsewhere classified  Muscle weakness (generalized)  Difficulty in walking, not elsewhere classified     Problem List Patient Active Problem List   Diagnosis Date Noted  . Status post total right knee replacement 10/06/2015  . RBBB 09/06/2015  . Preoperative cardiovascular examination 09/06/2015  . Mild cognitive impairment with memory loss 09/04/2015  . Loss of weight 09/04/2015  . Mild cognitive impairment 07/15/2014  . Primary osteoarthritis of right knee 07/15/2014  .  Xerosis cutis 07/15/2014  . Superficial burn of groin 11/24/2013  . B12 deficiency 09/23/2013  . Essential  hypertension, benign 09/23/2013  . PVC's (premature ventricular contractions) 09/23/2013  . Vitamin D deficiency 09/23/2013  . Hyperglycemia 09/23/2013  . Osteoarthritis of right knee 09/23/2013  . History of TIAs 08/06/2013  . DOE (dyspnea on exertion) 08/06/2013   Hessie Diener, PTA 12/04/2015 2:10 PM Phone: [phone] Fax: [facility] [address] [place], [name], [address] Phone: [phone]   Fax:  [phone]  Name: Shaundria Allegra MRN: [id number] Date of Birth: 04-25-1926

## 2015-12-06 ENCOUNTER — Other Ambulatory Visit: Payer: Self-pay | Admitting: Internal Medicine

## 2015-12-06 ENCOUNTER — Ambulatory Visit: Payer: Medicare Other | Admitting: Physical Therapy

## 2015-12-06 DIAGNOSIS — M25661 Stiffness of right knee, not elsewhere classified: Secondary | ICD-10-CM

## 2015-12-06 DIAGNOSIS — R262 Difficulty in walking, not elsewhere classified: Secondary | ICD-10-CM

## 2015-12-06 DIAGNOSIS — M25561 Pain in right knee: Secondary | ICD-10-CM | POA: Diagnosis not present

## 2015-12-06 DIAGNOSIS — M6281 Muscle weakness (generalized): Secondary | ICD-10-CM

## 2015-12-06 NOTE — Telephone Encounter (Signed)
Rx(s) sent to pharmacy electronically.  

## 2015-12-06 NOTE — Therapy (Signed)
[facility] [place], [name], [address] Phone: [phone]   Fax:  [phone]  Physical Therapy Treatment  Patient Details  Name: Mia [name] MRN: [id number] Date of Birth: 03/13/1926 Referring Provider: Jean Rosenthal, MD  Encounter Date: 12/06/2015      PT End of Session - 12/06/15 1204    Visit Number 6   Number of Visits 24   Date for PT Re-Evaluation 01/26/16   Authorization Type UHC Medicare   Authorization Time Period KX at visit 15   PT Start Time 1130   PT Stop Time 1230   PT Time Calculation (min) 60 min      Past Medical History  Diagnosis Date  . Hypertension   . TIA (transient ischemic attack) none recent  . Former smoker   . H/O ventricular fibrillation 10 yrs ago dx  . Prediabetes   . Chronic kidney disease     chronic kidney diseaase follow by primary md  . Arthritis     oa  . Seasonal allergies   . Diabetes mellitus without complication ([place])     Diet controlled    Past Surgical History  Procedure Laterality Date  . Transthoracic echocardiogram  12/2005    mild DUST, EF=>55%; mild mitral annular calcif; mild-mod aortic root calcif  . Nm myocar perf wall motion  01/2008    lexiscan myoview - normal pattern of perfusion in all regions, EF 92%, no significant ischemia demonstated  . Tonsillectomy  age 89    and adenoids  . Total knee arthroplasty Right 10/06/2015    Procedure: RIGHT TOTAL KNEE ARTHROPLASTY;  Surgeon: [name], MD;  Location: [facility];  Service: Orthopedics;  Laterality: Right;    There were no vitals filed for this visit.      Subjective Assessment - 12/06/15 1205    Subjective 3/10 at the most   Currently in Pain? No/denies   Aggravating Factors  activity   Pain Relieving Factors rest            [facility] PT Assessment - 12/06/15 0001    AROM   Right Knee Extension -12  end of tx   Right Knee Flexion 108                      [facility] Adult PT Treatment/Exercise - 12/06/15 0001    Ambulation/Gait   Pre-Gait Activities staggered stand weight shifting, retro walking with emphasis on terminal knee extension, heel strike gait in parallel bars progressing to with SPC- pt prefers SPC in right hand    Knee/Hip Exercises: Stretches   Active Hamstring Stretch 3 reps;30 seconds   Active Hamstring Stretch Limitations long sitting with combined gastroc stretch using strap    Other Knee/Hip Stretches knee to chest stretch 3 x 30 sec    Knee/Hip Exercises: Aerobic   Nustep L5 x 10 min  Without UE   Knee/Hip Exercises: Supine   Quad Sets 10 reps   Terminal Knee Extension 10 reps   Terminal Knee Extension Limitations towel roll at knee and ankle   Straight Leg Raises 10 reps;2 sets   Straight Leg Raises Limitations with initial quad set   Moist Heat Therapy   Moist Heat Location Knee  L with elevation (2 pillow cases)                  PT Short Term Goals - 11/22/15 1246    PT SHORT TERM GOAL #1  Title She will be independent with intal HEP   Period Weeks   Status On-going   PT SHORT TERM GOAL #2   Title She will have active flexion to 115 degrees   Time 4   Status On-going   PT SHORT TERM GOAL #3   Title She will be able to extend knee actively to -10 degrees for improved stability with walking   Time 4   Period Weeks   Status On-going   PT SHORT TERM GOAL #4   Title She will walk safely in home wih SPC   Time 4   Period Weeks   Status On-going   PT SHORT TERM GOAL #5   Title She will report pain decreased 25% or more overall   Time 4   Period Weeks   Status On-going           PT Long Term Goals - 11/09/15 1528    PT LONG TERM GOAL #1   Title She will be independent with all HEP issued   Time 12   Period Weeks   Status New   PT LONG TERM GOAL #2   Title She will report pain as intermittant without pain meds    Time 12   Period Weeks   Status New   PT LONG TERM GOAL #3   Title  She will walk in home with no device and in community with [facility] safely   Time 12   Period Weeks   Status New   PT LONG TERM GOAL #4   Title She will report doing all normal home tasks with 1-2/10 max pain and no device   Time 12   Period Weeks   Status New   PT LONG TERM GOAL #5   Title She will return to normal community activty   Time 12   Period Weeks   Status New               Plan - 12/06/15 1232    Clinical Impression Statement Slightly improved knee flexion and extension ROM today at end of treatment. Focues pre gait exercises to increase knee extension with gait.    PT Next Visit Plan review knee to chest stretch and calf stretch; continue to reinforce HEP. Manual for swelling and ROM, modalities for pain ans swelling, gait, quad and hip strengthening      Patient will benefit from skilled therapeutic intervention in order to improve the following deficits and impairments:  Decreased range of motion, Difficulty walking, Pain, Decreased activity tolerance, Increased edema, Decreased strength  Visit Diagnosis: No diagnosis found.     Problem List Patient Active Problem List   Diagnosis Date Noted  . Status post total right knee replacement 10/06/2015  . RBBB 09/06/2015  . Preoperative cardiovascular examination 09/06/2015  . Mild cognitive impairment with memory loss 09/04/2015  . Loss of weight 09/04/2015  . Mild cognitive impairment 07/15/2014  . Primary osteoarthritis of right knee 07/15/2014  . Xerosis cutis 07/15/2014  . Superficial burn of groin 11/24/2013  . B12 deficiency 09/23/2013  . Essential hypertension, benign 09/23/2013  . PVC's (premature ventricular contractions) 09/23/2013  . Vitamin D deficiency 09/23/2013  . Hyperglycemia 09/23/2013  . Osteoarthritis of right knee 09/23/2013  . History of TIAs 08/06/2013  . DOE (dyspnea on exertion) 08/06/2013    Dorene Ar, PTA 12/06/2015, 12:45 PM  [facility] [place], [name], [address] Phone: [phone]   Fax:  [phone]  Name: Mia [name] MRN: [id number] Date of Birth: 1925/11/24

## 2015-12-11 ENCOUNTER — Ambulatory Visit: Payer: Medicare Other | Admitting: Physical Therapy

## 2015-12-11 ENCOUNTER — Encounter: Payer: Self-pay | Admitting: Nurse Practitioner

## 2015-12-11 ENCOUNTER — Ambulatory Visit (INDEPENDENT_AMBULATORY_CARE_PROVIDER_SITE_OTHER): Payer: Medicare Other | Admitting: Nurse Practitioner

## 2015-12-11 VITALS — BP 120/70 | HR 91 | Temp 97.3°F | Resp 17 | Ht 67.0 in | Wt 117.2 lb

## 2015-12-11 DIAGNOSIS — I10 Essential (primary) hypertension: Secondary | ICD-10-CM | POA: Diagnosis not present

## 2015-12-11 DIAGNOSIS — M1711 Unilateral primary osteoarthritis, right knee: Secondary | ICD-10-CM | POA: Diagnosis not present

## 2015-12-11 DIAGNOSIS — M109 Gout, unspecified: Secondary | ICD-10-CM

## 2015-12-11 DIAGNOSIS — M10041 Idiopathic gout, right hand: Secondary | ICD-10-CM | POA: Diagnosis not present

## 2015-12-11 DIAGNOSIS — R634 Abnormal weight loss: Secondary | ICD-10-CM | POA: Diagnosis not present

## 2015-12-11 DIAGNOSIS — N184 Chronic kidney disease, stage 4 (severe): Secondary | ICD-10-CM | POA: Diagnosis not present

## 2015-12-11 MED ORDER — HYDROCHLOROTHIAZIDE 12.5 MG PO TABS: 12.5000 mg | ORAL_TABLET | Freq: Every day | ORAL | 1 refills | Status: DC

## 2015-12-11 MED ORDER — PREDNISONE 5 MG PO TABS: ORAL_TABLET | ORAL | 0 refills | Status: DC

## 2015-12-11 NOTE — Progress Notes (Signed)
PCP: Hollace Kinnier, DO  Advanced Directive information Does patient have an advance directive?: Yes, Type of Advance Directive: [place];Living will;Out of facility DNR (pink MOST or yellow form), Does patient want to make changes to advanced directive?: No - Patient declined  No Known Allergies  Chief Complaint  Patient presents with  . Medical Management of Chronic Issues    2 week follow up for blood pressure. Pt forgot to record BP      HPI: Patient is a 82 y.o. female seen in the office today to follow up blood pressure. At last visit her triamterene-hctz was stopped due to low blood pressure and elevated Cr on previous labs. Today she has had some weight gain and increase in swelling to LE. No discomfort noted from edema, pt does have neuropathy which causes discomfort.   Did not go to PT today due to knee pain, has follow up with Dr Ninfa Linden next week for knee.   Pt with distant history of gout, reports she does not flares since diagnosis.  Has swelling and pain to her 3rd finger on right hand, no injury noted. Reports 4 days ago she just woke up with it swollen and tender. Tender to DIP and PIP joint with warmth and redness No fevers or chills. Decreased ROM due to edema  Review of Systems:  Review of Systems  Constitutional: Negative for chills and fever.       Reports good appetite today  HENT: Negative for congestion.   Eyes:       Glasses  Respiratory: Negative for shortness of breath (with exertion).   Cardiovascular: Negative for chest pain and leg swelling.  Gastrointestinal: Negative for abdominal pain, constipation and diarrhea.  Genitourinary: Negative for dysuria.  Musculoskeletal: Positive for joint swelling (and pain to 3rd finger on right hand).       Increased pain to right knee  Skin: Negative for rash.  Neurological: Negative for dizziness and weakness.  Psychiatric/Behavioral: Negative for suicidal ideas.    Past Medical  History:  Diagnosis Date  . Arthritis    oa  . Chronic kidney disease    chronic kidney diseaase follow by primary md  . Diabetes mellitus without complication ([place])    Diet controlled  . Former smoker   . H/O ventricular fibrillation 10 yrs ago dx  . Hypertension   . Prediabetes   . Seasonal allergies   . TIA (transient ischemic attack) none recent   Past Surgical History:  Procedure Laterality Date  . NM MYOCAR PERF WALL MOTION  01/2008   lexiscan myoview - normal pattern of perfusion in all regions, EF 92%, no significant ischemia demonstated  . TONSILLECTOMY  age 39   and adenoids  . TOTAL KNEE ARTHROPLASTY Right 10/06/2015   Procedure: RIGHT TOTAL KNEE ARTHROPLASTY;  Surgeon: [name], MD;  Location: [facility];  Service: Orthopedics;  Laterality: Right;  . TRANSTHORACIC ECHOCARDIOGRAM  12/2005   mild DUST, EF=>55%; mild mitral annular calcif; mild-mod aortic root calcif   Social History:   reports that she quit smoking about 15 years ago. Her smoking use included Cigarettes. She has a 10.00 pack-year smoking history. She has never used smokeless tobacco. She reports that she drinks about 4.2 oz of alcohol per week . She reports that she does not use drugs.  Family History  Problem Relation Age of Onset  . Congestive Heart Failure Mother   . Lung cancer Brother   . Prostate cancer Son   .  Bipolar disorder Son   . Heart disease Son     Medications: Patient's Medications  New Prescriptions   No medications on file  Previous Medications   AMLODIPINE (NORVASC) 5 MG TABLET    TAKE 1 TABLET BY MOUTH ONCE DAILY   CHOLECALCIFEROL (VITAMIN D) 1000 UNITS TABLET    Take 1,000 Units by mouth daily. D3   CLOPIDOGREL (PLAVIX) 75 MG TABLET    take 1 tablet by mouth once daily   FOLIC ACID-VIT Q000111Q 123456 (FOLBEE) 2.5-25-1 MG TABS TABLET    Take 1 tablet by mouth daily.   KETOROLAC (ACULAR) 0.5 % OPHTHALMIC SOLUTION    Place 1 drop into the right eye 2 (two) times daily.    METHOCARBAMOL (ROBAXIN) 500 MG TABLET    Take 1 tablet (500 mg total) by mouth every 6 (six) hours as needed for muscle spasms.   METOPROLOL SUCCINATE (TOPROL-XL) 50 MG 24 HR TABLET    take 1 tablet by mouth once daily   SACCHAROMYCES BOULARDII (FLORASTOR) 250 MG CAPSULE    Take 250 mg by mouth daily. Reported on 11/09/2015  Modified Medications   No medications on file  Discontinued Medications   TRIAMTERENE-HYDROCHLOROTHIAZIDE (MAXZIDE-25) 37.5-25 MG TABLET    Take 1 tablet by mouth daily.     Physical Exam:  Vitals:   12/11/15 1319  BP: 120/70  Pulse: 91  Resp: 17  Temp: 97.3 F (36.3 C)  TempSrc: Oral  SpO2: 99%  Weight: 117 lb 3.2 oz (53.2 kg)  Height: 5\' 7"  (1.702 m)   Body mass index is 18.36 kg/m.  Physical Exam  Constitutional: She is oriented to person, place, and time. No distress.  Thin tall black female  Neck: Normal range of motion. Neck supple.  Cardiovascular: Normal rate, regular rhythm and normal heart sounds.   Pulmonary/Chest: Effort normal and breath sounds normal. No respiratory distress.  Abdominal: Soft. Bowel sounds are normal.  Musculoskeletal: She exhibits edema (1+ pitting edema bilaterally to LE) and tenderness.  Slow gait, small steps, using cane for support  Pain, swelling, heat noted to 3rd finger on right hand  Neurological: She is alert and oriented to person, place, and time.  Skin: Skin is warm and dry.  Psychiatric: She has a normal mood and affect.  Mild memory loss noted     Labs reviewed: Basic Metabolic Panel:  Recent Labs  01/13/15 1130  09/04/15 0901  10/07/15 0410 10/12/15 11/03/15 1334 11/24/15 1121  NA 142  < > 142  < > 140 144 138 140  K 3.8  < > 3.8  < > 2.9* 3.7 4.1 3.6  CL 99  < > 98  < > 99*  --  96 103  CO2 25  < > 26  < > 29  --  23 24  GLUCOSE 113*  < > 102*  < > 144*  --  181* 101*  BUN 29*  < > 36*  < > 26* 36* 40* 37*  CREATININE 1.73*  < > 1.96*  < > 1.49* 1.7* 2.24* 1.77*  CALCIUM 10.3  < > 10.4*  <  > 9.4  --  10.5* 9.9  TSH 2.080  --  2.770  --   --   --   --   --   < > = values in this interval not displayed. Liver Function Tests:  Recent Labs  02/27/15 1150 05/08/15 0910 09/04/15 0901 10/12/15  AST 21 22 23 25   ALT 13 13 12  23  ALKPHOS 56 55 51 59  BILITOT 0.4 0.4 0.6  --   PROT 6.7 6.1 6.5  --   ALBUMIN 4.3 4.1 4.3  --    No results for input(s): LIPASE, AMYLASE in the last 8760 hours. No results for input(s): AMMONIA in the last 8760 hours. CBC:  Recent Labs  09/04/15 0901  09/29/15 0930 10/07/15 0410 10/12/15 11/03/15 1334  WBC 5.6  < > 7.2 9.7 8.3 6.9  NEUTROABS 4.4  --   --   --  7 5.2  HGB  --   --  14.0 11.9* 10.0*  --   HCT 44.2  < > 40.9 36.0 31* 34.6  MCV 94  --  90.5 92.1  --  92  PLT 217  < > 215 239 327 293  < > = values in this interval not displayed. Lipid Panel:  Recent Labs  01/13/15 1130 05/08/15 0910 09/04/15 0901  CHOL 163 156 151  HDL 81 78 85  LDLCALC 59 59 53  TRIG 115 97 66  CHOLHDL 2.0 2.0 1.8   TSH:  Recent Labs  01/13/15 1130 09/04/15 0901  TSH 2.080 2.770   A1C: Lab Results  Component Value Date   HGBA1C 6.5 (H) 09/04/2015     Assessment/Plan 1. Acute gout of right hand, unspecified cause - predniSONE (DELTASONE) 5 MG tablet; Take 4 tablets daily for 2 days then decrease by 1 tablet daily until compete  Dispense: 14 tablet; Refill: 0 - Uric acid; Future  2. Essential hypertension -stable since off triamterene-hctz however increase in edema, will restart hctz  - BASIC METABOLIC PANEL WITH GFR; Future - hydrochlorothiazide (HYDRODIURIL) 12.5 MG tablet; Take 1 tablet (12.5 mg total) by mouth daily.  Dispense: 30 tablet; Refill: 1  3. Loss of weight -weight gain noted however most likely due to fluid since diuretic has been stopped  4. Primary osteoarthritis of right knee -following with PT and has follow up with orthopedic next week  5. CKD (chronic kidney disease) stage 4, GFR 15-29 ml/min  (HCC) -tolerated decrease in diuretic but when stopped swelling has worsened. 6 lb weight gain since dc'd. Will restart hctz and recheck renal function prior to follow up  To follow up in 4 weeks with uric acid and bmp prior to appt  Rondell Pardon K. Harle Battiest  [facility] [phone] 8 am - 5 pm) [phone] (after hours)

## 2015-12-11 NOTE — Patient Instructions (Signed)
Start HCTZ 12.5 mg daily   Take Prednisone 4 tablets daily for 2 days then 3 tablets, 2 tablets, 1 tablet then done  f

## 2015-12-13 ENCOUNTER — Ambulatory Visit: Payer: Medicare Other | Admitting: Physical Therapy

## 2015-12-13 DIAGNOSIS — M6281 Muscle weakness (generalized): Secondary | ICD-10-CM

## 2015-12-13 DIAGNOSIS — M25661 Stiffness of right knee, not elsewhere classified: Secondary | ICD-10-CM

## 2015-12-13 DIAGNOSIS — M25561 Pain in right knee: Secondary | ICD-10-CM | POA: Diagnosis not present

## 2015-12-13 DIAGNOSIS — R262 Difficulty in walking, not elsewhere classified: Secondary | ICD-10-CM

## 2015-12-13 NOTE — Therapy (Signed)
[place], [name], [address] Phone: [phone]   Fax:  [phone]  Physical Therapy Treatment  Patient Details  Name: Mia [name] MRN: [id number] Date of Birth: Sep 20, 1925 Referring Provider: Jean Rosenthal, MD  Encounter Date: 12/13/2015      PT End of Session - 12/13/15 1238    Visit Number 7   Number of Visits 24   Date for PT Re-Evaluation 01/26/16   PT Start Time 6283   PT Stop Time 1239   PT Time Calculation (min) 51 min   Activity Tolerance Patient tolerated treatment well   Behavior During Therapy [facility] for tasks assessed/performed      Past Medical History:  Diagnosis Date  . Arthritis    oa  . Chronic kidney disease    chronic kidney diseaase follow by primary md  . Diabetes mellitus without complication ([place])    Diet controlled  . Former smoker   . H/O ventricular fibrillation 10 yrs ago dx  . Hypertension   . Prediabetes   . Seasonal allergies   . TIA (transient ischemic attack) none recent    Past Surgical History:  Procedure Laterality Date  . NM MYOCAR PERF WALL MOTION  01/2008   lexiscan myoview - normal pattern of perfusion in all regions, EF 92%, no significant ischemia demonstated  . TONSILLECTOMY  age 44   and adenoids  . TOTAL KNEE ARTHROPLASTY Right 10/06/2015   Procedure: RIGHT TOTAL KNEE ARTHROPLASTY;  Surgeon: [name], MD;  Location: [facility];  Service: Orthopedics;  Laterality: Right;  . TRANSTHORACIC ECHOCARDIOGRAM  12/2005   mild DUST, EF=>55%; mild mitral annular calcif; mild-mod aortic root calcif    There were no vitals filed for this visit.      Subjective Assessment - 12/13/15 1147    Subjective "my knee is feeling more sore today, more swelling in the knee and ankle"    Currently in Pain? Yes   Pain Score 5    Pain Descriptors / Indicators Aching;Sore   Pain Type Surgical pain   Pain Onset More than a month ago   Pain Frequency  Intermittent   Aggravating Factors  walking   Pain Relieving Factors resting/ sitting,             [facility] PT Assessment - 12/13/15 1210      AROM   Right Knee Extension -15   Right Knee Flexion 108                     [facility] Adult PT Treatment/Exercise - 12/13/15 0001      Knee/Hip Exercises: Aerobic   Nustep L5 x 8 min     Knee/Hip Exercises: Standing   Gait Training heel strike/ toe off 5 x in //, backward walking with cues to hit toes first when 5 x in //.     Moist Heat Therapy   Number Minutes Moist Heat 10 Minutes   Moist Heat Location Knee  with pt in supine     Manual Therapy   Manual Therapy Passive ROM   Joint Mobilization Right knee extension mobs supine, patella mobs   Passive ROM forced flexion with PT's arm in popliteal space 5 x 20 sec oscillations                PT Education - 12/13/15 1237    Education provided Yes   Education Details walking heel to toe exaggerated, if she can do it exaggerated  then she can do it normally.   Person(s) Educated Patient   Methods Explanation;Verbal cues   Comprehension Verbalized understanding;Verbal cues required          PT Short Term Goals - 12/13/15 1241      PT SHORT TERM GOAL #1   Title She will be independent with intal HEP   Time 4   Period Weeks   Status Partially Met     PT SHORT TERM GOAL #2   Title She will have active flexion to 115 degrees   Time 4   Period Weeks   Status Partially Met     PT SHORT TERM GOAL #3   Title She will be able to extend knee actively to -10 degrees for improved stability with walking   Time 4   Period Weeks   Status On-going     PT SHORT TERM GOAL #4   Title She will walk safely in home wih SPC   Time 4   Period Weeks   Status Partially Met     PT SHORT TERM GOAL #5   Title She will report pain decreased 25% or more overall   Time 4   Period Weeks   Status On-going           PT Long Term Goals - 12/13/15 1241      PT LONG  TERM GOAL #1   Title She will be independent with all HEP issued   Time 12   Period Weeks   Status On-going     PT LONG TERM GOAL #2   Title She will report pain as intermittant without pain meds    Time 12   Period Weeks   Status On-going     PT LONG TERM GOAL #3   Title She will walk in home with no device and in community with [facility] safely   Time 12   Period Weeks   Status On-going     PT LONG TERM GOAL #4   Title She will report doing all normal home tasks with 1-2/10 max pain and no device   Time 12   Period Weeks   Status On-going     PT LONG TERM GOAL #5   Title She will return to normal community activty   Time 12   Period Weeks   Status On-going               Plan - 12/13/15 1238    Clinical Impression Statement Mrs. Masri reports increased soreness and swelling in the R knee. Following edema reduction massage with elevation, and scar x-friction massage she reported decreased pain. continued to work on ROM with Mobs gait training utilizing heel strike/ toe off.    PT Next Visit Plan review knee to chest stretch and calf stretch; continue to reinforce HEP. Manual for swelling and ROM, modalities for pain ans swelling, gait, quad and hip strengthening, gait training.    Consulted and Agree with Plan of Care Patient      Patient will benefit from skilled therapeutic intervention in order to improve the following deficits and impairments:  Decreased range of motion, Difficulty walking, Pain, Decreased activity tolerance, Increased edema, Decreased strength  Visit Diagnosis: Pain in right knee  Stiffness of right knee, not elsewhere classified  Muscle weakness (generalized)  Difficulty in walking, not elsewhere classified     Problem List Patient Active Problem List   Diagnosis Date Noted  . Status post total right knee replacement 10/06/2015  .  RBBB 09/06/2015  . Preoperative cardiovascular examination 09/06/2015  . Mild cognitive impairment with  memory loss 09/04/2015  . Loss of weight 09/04/2015  . Mild cognitive impairment 07/15/2014  . Primary osteoarthritis of right knee 07/15/2014  . Xerosis cutis 07/15/2014  . Superficial burn of groin 11/24/2013  . B12 deficiency 09/23/2013  . Essential hypertension, benign 09/23/2013  . PVC's (premature ventricular contractions) 09/23/2013  . Vitamin D deficiency 09/23/2013  . Hyperglycemia 09/23/2013  . History of TIAs 08/06/2013  . DOE (dyspnea on exertion) 08/06/2013   [name] PT, DPT, LAT, ATC  12/13/15  12:43 PM      [facility] [address] [place], [name], [address] Phone: [phone]   Fax:  [phone]  Name: Mia [name] MRN: [id number] Date of Birth: 01/16/1926

## 2015-12-18 ENCOUNTER — Ambulatory Visit: Payer: Medicare Other | Admitting: Physical Therapy

## 2015-12-18 DIAGNOSIS — M25561 Pain in right knee: Secondary | ICD-10-CM

## 2015-12-18 DIAGNOSIS — R262 Difficulty in walking, not elsewhere classified: Secondary | ICD-10-CM

## 2015-12-18 DIAGNOSIS — M6281 Muscle weakness (generalized): Secondary | ICD-10-CM

## 2015-12-18 DIAGNOSIS — M25661 Stiffness of right knee, not elsewhere classified: Secondary | ICD-10-CM

## 2015-12-19 NOTE — Therapy (Signed)
[place], [name], [address] Phone: [phone]   Fax:  [phone]  Physical Therapy Treatment  Patient Details  Name: Mia [name] MRN: [id number] Date of Birth: 1926-04-22 Referring Provider: Jean Rosenthal, MD  Encounter Date: 12/18/2015      PT End of Session - 12/18/15 1236    Visit Number 8   Number of Visits 24   Date for PT Re-Evaluation 01/26/16   Authorization Type U[employer] Medicare   Authorization Time Period KX at visit 15   PT Start Time 1147   PT Stop Time 1240   PT Time Calculation (min) 53 min      Past Medical History:  Diagnosis Date  . Arthritis    oa  . Chronic kidney disease    chronic kidney diseaase follow by primary md  . Diabetes mellitus without complication ([place])    Diet controlled  . Former smoker   . H/O ventricular fibrillation 10 yrs ago dx  . Hypertension   . Prediabetes   . Seasonal allergies   . TIA (transient ischemic attack) none recent    Past Surgical History:  Procedure Laterality Date  . NM MYOCAR PERF WALL MOTION  01/2008   lexiscan myoview - normal pattern of perfusion in all regions, EF 92%, no significant ischemia demonstated  . TONSILLECTOMY  age 82   and adenoids  . TOTAL KNEE ARTHROPLASTY Right 10/06/2015   Procedure: RIGHT TOTAL KNEE ARTHROPLASTY;  Surgeon: [name], MD;  Location: [facility];  Service: Orthopedics;  Laterality: Right;  . TRANSTHORACIC ECHOCARDIOGRAM  12/2005   mild DUST, EF=>55%; mild mitral annular calcif; mild-mod aortic root calcif    There were no vitals filed for this visit.      Subjective Assessment - 12/18/15 1236    Currently in Pain? No/denies                         [facility] Adult PT Treatment/Exercise - 12/19/15 0001      Knee/Hip Exercises: Stretches   Active Hamstring Stretch 3 reps;30 seconds   Active Hamstring Stretch Limitations long sitting with combined gastroc stretch  using strap    Gastroc Stretch 2 reps;30 seconds   Gastroc Stretch Limitations slant board stretch 3 x 30 [place]     Knee/Hip Exercises: Aerobic   Nustep L5 x 10 min     Knee/Hip Exercises: Standing   Other Standing Knee Exercises TKE with ball behind knee on wall x 15 , 5sec each     Knee/Hip Exercises: Seated   Other Seated Knee/Hip Exercises quad set with heel on floor x10   Marching 2 sets;10 reps     Knee/Hip Exercises: Supine   Quad Sets 10 reps   Short Arc Quad Sets Right;2 sets;10 reps     Knee/Hip Exercises: Prone   Hamstring Curl 2 sets;10 reps     Moist Heat Therapy   Number Minutes Moist Heat 10 Minutes   Moist Heat Location Knee     Manual Therapy   Manual therapy comments Prone soft tissue work to distal hamstrings, proximal calf   Joint Mobilization Right knee extension mobs supine, patella mobs                  PT Short Term Goals - 12/13/15 1241      PT SHORT TERM GOAL #1   Title She will be independent with intal HEP   Time 4  Period Weeks   Status Partially Met     PT SHORT TERM GOAL #2   Title She will have active flexion to 115 degrees   Time 4   Period Weeks   Status Partially Met     PT SHORT TERM GOAL #3   Title She will be able to extend knee actively to -10 degrees for improved stability with walking   Time 4   Period Weeks   Status On-going     PT SHORT TERM GOAL #4   Title She will walk safely in home wih SPC   Time 4   Period Weeks   Status Partially Met     PT SHORT TERM GOAL #5   Title She will report pain decreased 25% or more overall   Time 4   Period Weeks   Status On-going           PT Long Term Goals - 12/13/15 1241      PT LONG TERM GOAL #1   Title She will be independent with all HEP issued   Time 12   Period Weeks   Status On-going     PT LONG TERM GOAL #2   Title She will report pain as intermittant without pain meds    Time 12   Period Weeks   Status On-going     PT LONG TERM GOAL #3    Title She will walk in home with no device and in community with [name] safely   Time 12   Period Weeks   Status On-going     PT LONG TERM GOAL #4   Title She will report doing all normal home tasks with 1-2/10 max pain and no device   Time 12   Period Weeks   Status On-going     PT LONG TERM GOAL #5   Title She will return to normal community activty   Time 12   Period Weeks   Status On-going               Plan - 12/18/15 1245    Clinical Impression Statement Pt continues to lack full knee extension. She reports posterior knee pain during passive knee extension. Trial of prone lying with foot off edge of mat to stretch posterior knee. Soft tissue work to distal hamstrings. Standing terminal knee extensions are difficult for patient. Moist heat to posterior knee after therex.    PT Next Visit Plan FOTO; CHECK GOALS ;review knee to chest stretch and calf stretch; continue to reinforce HEP. Manual for swelling and ROM, modalities for pain ans swelling, gait, quad and hip strengthening, gait training.    PT Home Exercise Plan knee extension stretching with quad set, hamstring stretch, LAQ, heel lide, SLR      Patient will benefit from skilled therapeutic intervention in order to improve the following deficits and impairments:  Decreased range of motion, Difficulty walking, Pain, Decreased activity tolerance, Increased edema, Decreased strength  Visit Diagnosis: Pain in right knee  Stiffness of right knee, not elsewhere classified  Muscle weakness (generalized)  Difficulty in walking, not elsewhere classified     Problem List Patient Active Problem List   Diagnosis Date Noted  . Status post total right knee replacement 10/06/2015  . RBBB 09/06/2015  . Preoperative cardiovascular examination 09/06/2015  . Mild cognitive impairment with memory loss 09/04/2015  . Loss of weight 09/04/2015  . Mild cognitive impairment 07/15/2014  . Primary osteoarthritis of right knee  07/15/2014  .  Xerosis cutis 07/15/2014  . Superficial burn of groin 11/24/2013  . B12 deficiency 09/23/2013  . Essential hypertension, benign 09/23/2013  . PVC's (premature ventricular contractions) 09/23/2013  . Vitamin D deficiency 09/23/2013  . Hyperglycemia 09/23/2013  . History of TIAs 08/06/2013  . DOE (dyspnea on exertion) 08/06/2013    Dorene Ar, PTA 12/19/2015, 10:50 AM  [place], [name], [address] Phone: [phone]   Fax:  [phone]  Name: Kathyleen Radice MRN: [id number] Date of Birth: June 03, 1925

## 2015-12-26 ENCOUNTER — Ambulatory Visit: Payer: Medicare Other | Attending: Orthopaedic Surgery | Admitting: Physical Therapy

## 2015-12-26 DIAGNOSIS — R2681 Unsteadiness on feet: Secondary | ICD-10-CM | POA: Diagnosis present

## 2015-12-26 DIAGNOSIS — M25661 Stiffness of right knee, not elsewhere classified: Secondary | ICD-10-CM | POA: Diagnosis present

## 2015-12-26 DIAGNOSIS — M25561 Pain in right knee: Secondary | ICD-10-CM | POA: Insufficient documentation

## 2015-12-26 DIAGNOSIS — R262 Difficulty in walking, not elsewhere classified: Secondary | ICD-10-CM | POA: Diagnosis present

## 2015-12-26 DIAGNOSIS — M6281 Muscle weakness (generalized): Secondary | ICD-10-CM | POA: Diagnosis present

## 2015-12-26 NOTE — Therapy (Signed)
[place], [name], [address] Phone: [phone]   Fax:  [phone]  Physical Therapy Treatment  Patient Details  Name: Mia [name] MRN: [id number] Date of Birth: 1925/10/04 Referring Provider: Jean Rosenthal, MD  Encounter Date: 12/26/2015      PT End of Session - 12/26/15 1206    Visit Number 9   Number of Visits 24   Date for PT Re-Evaluation 01/26/16   Authorization Type UHC Medicare   Authorization Time Period KX at visit 15   PT Start Time 1147   PT Stop Time 1245   PT Time Calculation (min) 58 min      Past Medical History:  Diagnosis Date  . Arthritis    oa  . Chronic kidney disease    chronic kidney diseaase follow by primary md  . Diabetes mellitus without complication ([place])    Diet controlled  . Former smoker   . H/O ventricular fibrillation 10 yrs ago dx  . Hypertension   . Prediabetes   . Seasonal allergies   . TIA (transient ischemic attack) none recent    Past Surgical History:  Procedure Laterality Date  . NM MYOCAR PERF WALL MOTION  01/2008   lexiscan myoview - normal pattern of perfusion in all regions, EF 92%, no significant ischemia demonstated  . TONSILLECTOMY  age 21   and adenoids  . TOTAL KNEE ARTHROPLASTY Right 10/06/2015   Procedure: RIGHT TOTAL KNEE ARTHROPLASTY;  Surgeon: [name], MD;  Location: [facility];  Service: Orthopedics;  Laterality: Right;  . TRANSTHORACIC ECHOCARDIOGRAM  12/2005   mild DUST, EF=>55%; mild mitral annular calcif; mild-mod aortic root calcif    There were no vitals filed for this visit.      Subjective Assessment - 12/26/15 1209    Currently in Pain? No/denies            [facility] PT Assessment - 12/26/15 0001      AROM   Right Knee Extension -15   Right Knee Flexion 108  112 after stretch                     [facility] Adult PT Treatment/Exercise - 12/26/15 0001      High Level Balance   High  Level Balance Comments forward gait with head turns and nods, frequent changes in gait, one LOB with min assist to recover.      Knee/Hip Exercises: Stretches   Passive Hamstring Stretch 2 reps;30 seconds   Passive Hamstring Stretch Limitations with contract relax x 2     Knee/Hip Exercises: Aerobic   Nustep L5 x 10 min     Knee/Hip Exercises: Seated   Other Seated Knee/Hip Exercises quad set with heel on floor x10   Marching 2 sets;10 reps     Knee/Hip Exercises: Supine   Quad Sets 20 reps   Terminal Knee Extension 10 reps   Terminal Knee Extension Limitations towel roll at knee and ankle   Straight Leg Raises 10 reps;2 sets   Straight Leg Raises Limitations with initial quad set     Moist Heat Therapy   Number Minutes Moist Heat 10 Minutes   Moist Heat Location Knee     Manual Therapy   Joint Mobilization Right knee extension mobs supine, patella mobs   Passive ROM passive hip flexor/quad stretch 2 x 30 sec , knee extension PROM/overpressure  PT Short Term Goals - 12/26/15 1207      PT SHORT TERM GOAL #1   Title She will be independent with intal HEP   Time 4   Period Weeks   Status Partially Met     PT SHORT TERM GOAL #2   Title She will have active flexion to 115 degrees   Time 4   Period Weeks   Status Partially Met     PT SHORT TERM GOAL #3   Title She will be able to extend knee actively to -10 degrees for improved stability with walking   Time 4   Period Weeks   Status On-going     PT SHORT TERM GOAL #4   Title She will walk safely in home wih SPC   Time 4   Period Weeks   Status Achieved     PT SHORT TERM GOAL #5   Title She will report pain decreased 25% or more overall   Time 4   Period Weeks   Status Achieved           PT Long Term Goals - 12/26/15 1208      PT LONG TERM GOAL #1   Title She will be independent with all HEP issued   Time 12   Period Weeks   Status On-going     PT LONG TERM GOAL #2   Title  She will report pain as intermittant without pain meds    Time 12   Period Weeks   Status Achieved     PT LONG TERM GOAL #3   Title She will walk in home with no device and in community with [facility] safely   Time 12   Period Weeks   Status Achieved     PT LONG TERM GOAL #4   Title She will report doing all normal home tasks with 1-2/10 max pain and no device   Time 12   Period Weeks   Status Achieved     PT LONG TERM GOAL #5   Title She will return to normal community activty   Time 12   Period Weeks   Status Partially Met               Plan - 12/26/15 1251    Clinical Impression Statement Pt enters today without SPC and feels gait and balance confidence are improving. Challenged dynamic balance with head turns and nods. Pt demonstrates difficulty maintinaing straight path and has one episode of LOB requring min assist to recover. Urged pt to continue [facility] in community. Knee flexion AROM 112 today after stretfhing. Her knee  AROM extension continues to lack 15 dgrees and she has increased pain with TKE and passive extension. Passive knee extension -12.  She reports pain is intermittent and at least 25 % reduced overall. She has returned to normal home activites without increased pain and most community activities. STG# 4,5 MET. LTG# 2,3,4 MET, LTG#5 partially MET.    PT Next Visit Plan FOTO; ;review knee to chest stretch and calf stretch; continue to reinforce HEP. Manual for swelling and ROM, modalities for pain ans swelling, gait, quad and hip strengthening, gait training/dynamic balance.       Patient will benefit from skilled therapeutic intervention in order to improve the following deficits and impairments:  Decreased range of motion, Difficulty walking, Pain, Decreased activity tolerance, Increased edema, Decreased strength  Visit Diagnosis: Pain in right knee  Stiffness of right knee, not elsewhere classified  Muscle  weakness (generalized)  Difficulty in walking, not  elsewhere classified     Problem List Patient Active Problem List   Diagnosis Date Noted  . Status post total right knee replacement 10/06/2015  . RBBB 09/06/2015  . Preoperative cardiovascular examination 09/06/2015  . Mild cognitive impairment with memory loss 09/04/2015  . Loss of weight 09/04/2015  . Mild cognitive impairment 07/15/2014  . Primary osteoarthritis of right knee 07/15/2014  . Xerosis cutis 07/15/2014  . Superficial burn of groin 11/24/2013  . B12 deficiency 09/23/2013  . Essential hypertension, benign 09/23/2013  . PVC's (premature ventricular contractions) 09/23/2013  . Vitamin D deficiency 09/23/2013  . Hyperglycemia 09/23/2013  . History of TIAs 08/06/2013  . DOE (dyspnea on exertion) 08/06/2013    Dorene Ar, PTA 12/26/2015, 1:02 PM  [facility] [address] [place], [name], [address] Phone: [phone]   Fax:  [phone]  Name: Arnold Depinto MRN: [id number] Date of Birth: 1926-03-01

## 2015-12-29 ENCOUNTER — Ambulatory Visit: Payer: Medicare Other | Admitting: Physical Therapy

## 2015-12-29 DIAGNOSIS — M25561 Pain in right knee: Secondary | ICD-10-CM | POA: Diagnosis not present

## 2015-12-29 DIAGNOSIS — R262 Difficulty in walking, not elsewhere classified: Secondary | ICD-10-CM

## 2015-12-29 DIAGNOSIS — M25661 Stiffness of right knee, not elsewhere classified: Secondary | ICD-10-CM

## 2015-12-29 DIAGNOSIS — M6281 Muscle weakness (generalized): Secondary | ICD-10-CM

## 2015-12-29 NOTE — Therapy (Addendum)
[place], [name], [address] Phone: [phone]   Fax:  [phone]  Physical Therapy Treatment  Patient Details  Name: Mia [name] MRN: [id number] Date of Birth: 05-20-1926 Referring Provider: Jean Rosenthal, MD  Encounter Date: 12/29/2015      PT End of Session - 12/29/15 1240    Visit Number 10   Number of Visits 24   Date for PT Re-Evaluation 01/26/16   Authorization Type [employer] Medicare   Authorization Time Period KX at visit 15   PT Start Time 1100   PT Stop Time 1145   PT Time Calculation (min) 45 min      Past Medical History:  Diagnosis Date  . Arthritis    oa  . Chronic kidney disease    chronic kidney diseaase follow by primary md  . Diabetes mellitus without complication ([place])    Diet controlled  . Former smoker   . H/O ventricular fibrillation 10 yrs ago dx  . Hypertension   . Prediabetes   . Seasonal allergies   . TIA (transient ischemic attack) none recent    Past Surgical History:  Procedure Laterality Date  . NM MYOCAR PERF WALL MOTION  01/2008   lexiscan myoview - normal pattern of perfusion in all regions, EF 92%, no significant ischemia demonstated  . TONSILLECTOMY  age 61   and adenoids  . TOTAL KNEE ARTHROPLASTY Right 10/06/2015   Procedure: RIGHT TOTAL KNEE ARTHROPLASTY;  Surgeon: [name], MD;  Location: [facility];  Service: Orthopedics;  Laterality: Right;  . TRANSTHORACIC ECHOCARDIOGRAM  12/2005   mild DUST, EF=>55%; mild mitral annular calcif; mild-mod aortic root calcif    There were no vitals filed for this visit.      Subjective Assessment - 12/29/15 1242    Subjective I am doing okay   Currently in Pain? No/denies                         [facility] Adult PT Treatment/Exercise - 12/29/15 0001      Knee/Hip Exercises: Stretches   Active Hamstring Stretch 3 reps;30 seconds   Active Hamstring Stretch Limitations long sitting  with combined gastroc stretch using strap around ball of foot forr added calf stretch, cues for active extension   Gastroc Stretch 2 reps;30 seconds   Gastroc Stretch Limitations runners stretch holding doorway     Knee/Hip Exercises: Aerobic   Recumbent Bike Rec bike L1 x 5 min   Nustep L5 x 5 min     Knee/Hip Exercises: Standing   SLS 1 Ue support,    SLS with Vectors x 10 flex, ext and abdct for propriception in right   Other Standing Knee Exercises TKE with ball behind knee on wall x 15 , 5sec each     Knee/Hip Exercises: Seated   Long Arc Quad 1 set;10 reps   Long Arc Quad Weight 5 lbs.   Long [employer] Limitations 10 sec holds   Other Seated Knee/Hip Exercises quad set with heel on floor x10     Knee/Hip Exercises: Supine   Short Arc Quad Sets Right;3 sets;10 reps   Short Arc Quad Sets Limitations 5                  PT Short Term Goals - 12/29/15 1241      PT SHORT TERM GOAL #1   Title She will be independent with intal HEP   Time  4   Period Weeks   Status Partially Met     PT SHORT TERM GOAL #2   Title She will have active flexion to 115 degrees   Baseline 108   Time 4   Period Weeks   Status Partially Met     PT SHORT TERM GOAL #3   Title She will be able to extend knee actively to -10 degrees for improved stability with walking   Baseline -15   Period Weeks   Status On-going     PT SHORT TERM GOAL #4   Title She will walk safely in home wih SPC   Time 4   Period Weeks   Status Achieved     PT SHORT TERM GOAL #5   Title She will report pain decreased 25% or more overall   Time 4   Period Weeks   Status Achieved           PT Long Term Goals - 12/29/15 1241      PT LONG TERM GOAL #1   Title She will be independent with all HEP issued   Time 12   Period Weeks   Status On-going     PT LONG TERM GOAL #2   Title She will report pain as intermittant without pain meds    Time 12   Period Weeks   Status Achieved     PT LONG TERM  GOAL #3   Title She will walk in home with no device and in community with [facility] safely   Time 12   Period Weeks   Status Achieved     PT LONG TERM GOAL #4   Title She will report doing all normal home tasks with 1-2/10 max pain and no device   Period Weeks   Status Achieved     PT LONG TERM GOAL #5   Title She will return to normal community activty   Baseline mostly   Time 12   Period Weeks   Status Partially Met     G-Code      Clinical  Judgement:   Mobility ; walking and moving around        Current status : CK   Goal status  Andrey Cota, PT 01/04/16  4:14 PM       Plan - 12/29/15 1231    Clinical Impression Statement Pt enters today without SPC. She demonstrates decreased confidence with gait and has to stop and steady herself after transitional movments. I asked her to return to using [facility] and she agreed. She would like to improve her balance. We focused knee extension and calf stretching as well as terminal knee exercises and balance. She requires rest breaks throughout treatment however tolerates treatment well. She demonstrates decreased heel strike and lacks knee extension on right with gait.    PT Next Visit Plan REMEMBER FOTO; ;review knee to chest stretch and calf stretch; continue to reinforce HEP. Manual for swelling and ROM, modalities for pain ans swelling, gait, quad and hip strengthening, gait training/dynamic balance.    PT Home Exercise Plan knee extension stretching with quad set, hamstring stretch, LAQ, heel lide, SLR, runners stretch      Patient will benefit from skilled therapeutic intervention in order to improve the following deficits and impairments:  Decreased range of motion, Difficulty walking, Pain, Decreased activity tolerance, Increased edema, Decreased strength  Visit Diagnosis: Pain in right knee  Stiffness of right knee, not elsewhere classified  Muscle  weakness (generalized)  Difficulty in walking, not elsewhere  classified     Problem List Patient Active Problem List   Diagnosis Date Noted  . Status post total right knee replacement 10/06/2015  . RBBB 09/06/2015  . Preoperative cardiovascular examination 09/06/2015  . Mild cognitive impairment with memory loss 09/04/2015  . Loss of weight 09/04/2015  . Mild cognitive impairment 07/15/2014  . Primary osteoarthritis of right knee 07/15/2014  . Xerosis cutis 07/15/2014  . Superficial burn of groin 11/24/2013  . B12 deficiency 09/23/2013  . Essential hypertension, benign 09/23/2013  . PVC's (premature ventricular contractions) 09/23/2013  . Vitamin D deficiency 09/23/2013  . Hyperglycemia 09/23/2013  . History of TIAs 08/06/2013  . DOE (dyspnea on exertion) 08/06/2013    Dorene Ar, PTA 12/29/2015, 12:44 PM  [facility] [address] [place], [name], [address] Phone: [phone]   Fax:  [phone]  Name: Mia [name] MRN: [id number] Date of Birth: 07-Feb-1926

## 2016-01-01 ENCOUNTER — Other Ambulatory Visit: Payer: Self-pay | Admitting: Internal Medicine

## 2016-01-02 ENCOUNTER — Encounter: Payer: Medicare Other | Admitting: Physical Therapy

## 2016-01-02 NOTE — Telephone Encounter (Signed)
Rx(s) sent to pharmacy electronically.  

## 2016-01-04 ENCOUNTER — Other Ambulatory Visit: Payer: Medicare Other

## 2016-01-04 DIAGNOSIS — I10 Essential (primary) hypertension: Secondary | ICD-10-CM

## 2016-01-04 DIAGNOSIS — M109 Gout, unspecified: Secondary | ICD-10-CM

## 2016-01-04 LAB — BASIC METABOLIC PANEL WITH GFR
BUN: 28 mg/dL — ABNORMAL HIGH (ref 7–25)
CO2: 26 mmol/L (ref 20–31)
Calcium: 9.4 mg/dL (ref 8.6–10.4)
Chloride: 102 mmol/L (ref 98–110)
Creat: 1.52 mg/dL — ABNORMAL HIGH (ref 0.60–0.88)
GFR, Est African American: 35 mL/min — ABNORMAL LOW (ref >=60)
GFR, Est Non African American: 30 mL/min — ABNORMAL LOW (ref >=60)
Glucose, Bld: 106 mg/dL — ABNORMAL HIGH (ref 65–99)
Potassium: 3.1 mmol/L — ABNORMAL LOW (ref 3.5–5.3)
Sodium: 141 mmol/L (ref 135–146)

## 2016-01-04 LAB — URIC ACID: Uric Acid, Serum: 8.3 mg/dL — ABNORMAL HIGH (ref 2.5–7.0)

## 2016-01-05 ENCOUNTER — Ambulatory Visit: Payer: Medicare Other

## 2016-01-05 DIAGNOSIS — R2681 Unsteadiness on feet: Secondary | ICD-10-CM

## 2016-01-05 DIAGNOSIS — R262 Difficulty in walking, not elsewhere classified: Secondary | ICD-10-CM

## 2016-01-05 DIAGNOSIS — M25561 Pain in right knee: Secondary | ICD-10-CM | POA: Diagnosis not present

## 2016-01-05 DIAGNOSIS — M6281 Muscle weakness (generalized): Secondary | ICD-10-CM

## 2016-01-05 DIAGNOSIS — M25661 Stiffness of right knee, not elsewhere classified: Secondary | ICD-10-CM

## 2016-01-05 NOTE — Therapy (Signed)
[place], [name], [address] Phone: [phone]   Fax:  [phone]  Physical Therapy Treatment  Patient Details  Name: Mia [name] MRN: [id number] Date of Birth: 03-20-1926 Referring Provider: Jean Rosenthal, MD  Encounter Date: 01/05/2016      PT End of Session - 01/05/16 1113    Visit Number 11   Number of Visits 24   Date for PT Re-Evaluation 02/16/16   Authorization Type U[employer] Medicare   Authorization Time Period KX at visit 15   PT Start Time 1110   PT Stop Time 1150   PT Time Calculation (min) 40 min   Activity Tolerance Patient tolerated treatment well   Behavior During Therapy [facility] for tasks assessed/performed      Past Medical History:  Diagnosis Date  . Arthritis    oa  . Chronic kidney disease    chronic kidney diseaase follow by primary md  . Diabetes mellitus without complication ([place])    Diet controlled  . Former smoker   . H/O ventricular fibrillation 10 yrs ago dx  . Hypertension   . Prediabetes   . Seasonal allergies   . TIA (transient ischemic attack) none recent    Past Surgical History:  Procedure Laterality Date  . NM MYOCAR PERF WALL MOTION  01/2008   lexiscan myoview - normal pattern of perfusion in all regions, EF 92%, no significant ischemia demonstated  . TONSILLECTOMY  age 89   and adenoids  . TOTAL KNEE ARTHROPLASTY Right 10/06/2015   Procedure: RIGHT TOTAL KNEE ARTHROPLASTY;  Surgeon: [name], MD;  Location: [facility];  Service: Orthopedics;  Laterality: Right;  . TRANSTHORACIC ECHOCARDIOGRAM  12/2005   mild DUST, EF=>55%; mild mitral annular calcif; mild-mod aortic root calcif    There were no vitals filed for this visit.      Subjective Assessment - 01/05/16 1112    Subjective Pain is intermittant and when present it is not bad.  She feels she is better but wants to walk without device.     Currently in Pain? No/denies             [facility] PT Assessment - 01/05/16 1114      AROM   Right Knee Extension -14   Right Knee Flexion 118  supine     PROM   Right Knee Extension -11   Right Knee Flexion 112     Standardized Balance Assessment   Standardized Balance Assessment Berg Balance Test     Berg Balance Test   Sit to Stand Able to stand  independently using hands   Standing Unsupported Able to stand safely 2 minutes   Sitting with Back Unsupported but Feet Supported on Floor or Stool Able to sit safely and securely 2 minutes   Stand to Sit Controls descent by using hands   Transfers Able to transfer safely, definite need of hands   Standing Unsupported with Eyes Closed Able to stand 10 seconds safely   Standing Ubsupported with Feet Together Able to place feet together independently and stand 1 minute safely   From Standing, Reach Forward with Outstretched Arm Can reach forward >12 cm safely (5")   From Standing Position, Pick up Object from Floor Able to pick up shoe safely and easily   From Standing Position, Turn to Look Behind Over each Shoulder Turn sideways only but maintains balance   Turn 360 Degrees Able to turn 360 degrees safely but slowly   Standing  Unsupported, Alternately Place Feet on Step/Stool Needs assistance to keep from falling or unable to try   Standing Unsupported, One Foot in [place] to take small step independently and hold 30 seconds   Standing on One Leg Tries to lift leg/unable to hold 3 seconds but remains standing independently   Total Score 39                     [facility] Adult PT Treatment/Exercise - 01/05/16 1133      Knee/Hip Exercises: Aerobic   Nustep L6 x 5 min     Knee/Hip Exercises: Seated   Sit to Sand 2 sets;10 reps     Stretching leg extended off mat  With quad sets and manual stretch from patient             PT Short Term Goals - 12/29/15 1241      PT SHORT TERM GOAL #1   Title She will be independent with intal HEP   Time 4   Period  Weeks   Status Partially Met     PT SHORT TERM GOAL #2   Title She will have active flexion to 115 degrees   Baseline 108   Time 4   Period Weeks   Status Partially Met     PT SHORT TERM GOAL #3   Title She will be able to extend knee actively to -10 degrees for improved stability with walking   Baseline -15   Period Weeks   Status On-going     PT SHORT TERM GOAL #4   Title She will walk safely in home wih SPC   Time 4   Period Weeks   Status Achieved     PT SHORT TERM GOAL #5   Title She will report pain decreased 25% or more overall   Time 4   Period Weeks   Status Achieved           PT Long Term Goals - 01/05/16 1151      PT LONG TERM GOAL #1   Title She will be independent with all HEP issued   Status On-going     PT LONG TERM GOAL #2   Title She will report pain as intermittant without pain meds    Status Achieved     PT LONG TERM GOAL #3   Title She will walk in home with no device and in community with [facility] safely   Baseline But her BERG score indicated she needs SPC at all times as 39/56 score shows borderline need for walker   Status Achieved     PT LONG TERM GOAL #4   Title She will report doing all normal home tasks with 1-2/10 max pain and no device   Status Achieved     PT LONG TERM GOAL #5   Title She will return to normal community activty   Baseline mostly   Status Partially Met               Plan - 01/05/16 1111    Clinical Impression Statement Ms Girtman presents with decreased balance per BERG test score. She still has ROM decr RT knee post surgery. Her mobility and return to normal tasks is progressing. She needs for ROM at RT kne eand hips to improve stride and stability   PT Frequency 1x / week   PT Duration --  4-6 weeks   PT Treatment/Interventions Cryotherapy;Associate Professor;Therapeutic exercise;Manual techniques;Taping;Passive range of motion;Patient/family  education   PT Next Visit  Plan Cont ROM , strength and balance activity   Consulted and Agree with Plan of Care Patient      Patient will benefit from skilled therapeutic intervention in order to improve the following deficits and impairments:  Decreased range of motion, Difficulty walking, Pain, Decreased activity tolerance, Increased edema, Decreased strength  Visit Diagnosis: Difficulty in walking, not elsewhere classified  Muscle weakness (generalized)  Stiffness of right knee, not elsewhere classified  Pain in right knee  Unsteadiness on feet       G-Codes - 01/06/16 1155    Functional Assessment Tool Used FOTO 42% limited   Functional Limitation Mobility: Walking and moving around   Mobility: Walking and Moving Around Current Status [phone]) At least 40 percent but less than 60 percent impaired, limited or restricted   Mobility: Walking and Moving Around Goal Status [phone]) At least 20 percent but less than 40 percent impaired, limited or restricted      Problem List Patient Active Problem List   Diagnosis Date Noted  . Status post total right knee replacement 10/06/2015  . RBBB 09/06/2015  . Preoperative cardiovascular examination 09/06/2015  . Mild cognitive impairment with memory loss 09/04/2015  . Loss of weight 09/04/2015  . Mild cognitive impairment 07/15/2014  . Primary osteoarthritis of right knee 07/15/2014  . Xerosis cutis 07/15/2014  . Superficial burn of groin 11/24/2013  . B12 deficiency 09/23/2013  . Essential hypertension, benign 09/23/2013  . PVC's (premature ventricular contractions) 09/23/2013  . Vitamin D deficiency 09/23/2013  . Hyperglycemia 09/23/2013  . History of TIAs 08/06/2013  . DOE (dyspnea on exertion) 08/06/2013    [name]  PT 06-Jan-2016, 11:57 AM  [facility] [address] [place], [name], [address] Phone: [phone]   Fax:  [phone]  Name: Lyllie Cobbins MRN: [id number] Date of Birth:  12/25/1925

## 2016-01-08 ENCOUNTER — Ambulatory Visit: Payer: Medicare Other | Admitting: Physical Therapy

## 2016-01-08 ENCOUNTER — Ambulatory Visit (INDEPENDENT_AMBULATORY_CARE_PROVIDER_SITE_OTHER): Payer: Medicare Other | Admitting: Nurse Practitioner

## 2016-01-08 ENCOUNTER — Encounter: Payer: Self-pay | Admitting: Nurse Practitioner

## 2016-01-08 VITALS — BP 114/76 | HR 88 | Temp 97.6°F | Resp 17 | Ht 67.0 in | Wt 113.6 lb

## 2016-01-08 DIAGNOSIS — M25661 Stiffness of right knee, not elsewhere classified: Secondary | ICD-10-CM

## 2016-01-08 DIAGNOSIS — I10 Essential (primary) hypertension: Secondary | ICD-10-CM

## 2016-01-08 DIAGNOSIS — M109 Gout, unspecified: Secondary | ICD-10-CM

## 2016-01-08 DIAGNOSIS — M6281 Muscle weakness (generalized): Secondary | ICD-10-CM

## 2016-01-08 DIAGNOSIS — R600 Localized edema: Secondary | ICD-10-CM | POA: Diagnosis not present

## 2016-01-08 DIAGNOSIS — M10041 Idiopathic gout, right hand: Secondary | ICD-10-CM

## 2016-01-08 DIAGNOSIS — R262 Difficulty in walking, not elsewhere classified: Secondary | ICD-10-CM

## 2016-01-08 DIAGNOSIS — M6289 Other specified disorders of muscle: Secondary | ICD-10-CM

## 2016-01-08 DIAGNOSIS — R2681 Unsteadiness on feet: Secondary | ICD-10-CM

## 2016-01-08 DIAGNOSIS — R531 Weakness: Secondary | ICD-10-CM

## 2016-01-08 DIAGNOSIS — M25561 Pain in right knee: Secondary | ICD-10-CM

## 2016-01-08 MED ORDER — POTASSIUM CHLORIDE CRYS ER 10 MEQ PO TBCR: 10.0000 meq | EXTENDED_RELEASE_TABLET | Freq: Every day | ORAL | 3 refills | Status: DC

## 2016-01-08 MED ORDER — ALLOPURINOL 100 MG PO TABS: 100.0000 mg | ORAL_TABLET | Freq: Every day | ORAL | 6 refills | Status: DC

## 2016-01-08 NOTE — Therapy (Signed)
[place], [name], [address] Phone: [phone]   Fax:  [phone]  Physical Therapy Treatment  Patient Details  Name: Mia [name] MRN: [id number] Date of Birth: 01-May-1926 Referring Provider: Jean Rosenthal, MD  Encounter Date: 01/08/2016      PT End of Session - 01/08/16 1205    Visit Number 12   Number of Visits 24   Date for PT Re-Evaluation 02/16/16   Authorization Type UHC Medicare   Authorization Time Period KX at visit 15   PT Start Time 1151   PT Stop Time 1230   PT Time Calculation (min) 39 min      Past Medical History:  Diagnosis Date  . Arthritis    oa  . Chronic kidney disease    chronic kidney diseaase follow by primary md  . Diabetes mellitus without complication ([place])    Diet controlled  . Former smoker   . H/O ventricular fibrillation 10 yrs ago dx  . Hypertension   . Prediabetes   . Seasonal allergies   . TIA (transient ischemic attack) none recent    Past Surgical History:  Procedure Laterality Date  . NM MYOCAR PERF WALL MOTION  01/2008   lexiscan myoview - normal pattern of perfusion in all regions, EF 92%, no significant ischemia demonstated  . TONSILLECTOMY  age 19   and adenoids  . TOTAL KNEE ARTHROPLASTY Right 10/06/2015   Procedure: RIGHT TOTAL KNEE ARTHROPLASTY;  Surgeon: [name], MD;  Location: [facility];  Service: Orthopedics;  Laterality: Right;  . TRANSTHORACIC ECHOCARDIOGRAM  12/2005   mild DUST, EF=>55%; mild mitral annular calcif; mild-mod aortic root calcif    There were no vitals filed for this visit.                       [facility] Adult PT Treatment/Exercise - 01/08/16 0001      Knee/Hip Exercises: Stretches   Active Hamstring Stretch 3 reps;30 seconds   Active Hamstring Stretch Limitations passive, bilateral   Hip Flexor Stretch 3 reps;30 seconds   Hip Flexor Stretch Limitations edge of mat thomas, then passive  quad stretch 2 x 30 sec    Other Knee/Hip Stretches butterfly stretch and hip adductor stretch passively 3x 30 sec each     Knee/Hip Exercises: Seated   Other Seated Knee/Hip Exercises quad set with heel on floor x10     Knee/Hip Exercises: Supine   Quad Sets 20 reps                  PT Short Term Goals - 12/29/15 1241      PT SHORT TERM GOAL #1   Title She will be independent with intal HEP   Time 4   Period Weeks   Status Partially Met     PT SHORT TERM GOAL #2   Title She will have active flexion to 115 degrees   Baseline 108   Time 4   Period Weeks   Status Partially Met     PT SHORT TERM GOAL #3   Title She will be able to extend knee actively to -10 degrees for improved stability with walking   Baseline -15   Period Weeks   Status On-going     PT SHORT TERM GOAL #4   Title She will walk safely in home wih [facility]   Time 4   Period Weeks   Status Achieved     PT  SHORT TERM GOAL #5   Title She will report pain decreased 25% or more overall   Time 4   Period Weeks   Status Achieved           PT Long Term Goals - 01/05/16 1151      PT LONG TERM GOAL #1   Title She will be independent with all HEP issued   Status On-going     PT LONG TERM GOAL #2   Title She will report pain as intermittant without pain meds    Status Achieved     PT LONG TERM GOAL #3   Title She will walk in home with no device and in community with [facility] safely   Baseline But her BERG score indicated she needs SPC at all times as 39/56 score shows borderline need for walker   Status Achieved     PT LONG TERM GOAL #4   Title She will report doing all normal home tasks with 1-2/10 max pain and no device   Status Achieved     PT LONG TERM GOAL #5   Title She will return to normal community activty   Baseline mostly   Status Partially Met               Plan - 01/08/16 1237    Clinical Impression Statement We focused stretching her hips and knees today. Visually her  knee extension was improved post session. Pitting edema noted in left ankle. Pt sees MD today.    PT Next Visit Plan Cont hip and knee ROM , strength and balance activity      Patient will benefit from skilled therapeutic intervention in order to improve the following deficits and impairments:  Decreased range of motion, Difficulty walking, Pain, Decreased activity tolerance, Increased edema, Decreased strength  Visit Diagnosis: Difficulty in walking, not elsewhere classified  Muscle weakness (generalized)  Stiffness of right knee, not elsewhere classified  Pain in right knee  Unsteadiness on feet     Problem List Patient Active Problem List   Diagnosis Date Noted  . Status post total right knee replacement 10/06/2015  . RBBB 09/06/2015  . Preoperative cardiovascular examination 09/06/2015  . Mild cognitive impairment with memory loss 09/04/2015  . Loss of weight 09/04/2015  . Mild cognitive impairment 07/15/2014  . Primary osteoarthritis of right knee 07/15/2014  . Xerosis cutis 07/15/2014  . Superficial burn of groin 11/24/2013  . B12 deficiency 09/23/2013  . Essential hypertension, benign 09/23/2013  . PVC's (premature ventricular contractions) 09/23/2013  . Vitamin D deficiency 09/23/2013  . Hyperglycemia 09/23/2013  . History of TIAs 08/06/2013  . DOE (dyspnea on exertion) 08/06/2013    Dorene Ar, PTA 01/08/2016, 12:40 PM  [facility] [address] [place], [name], [address] Phone: [phone]   Fax:  [phone]  Name: Mia [name] MRN: [id number] Date of Birth: [birth year]

## 2016-01-08 NOTE — Patient Instructions (Addendum)
STOP norvasc  Start potassium supplement 10 meq daily Start Allopurinol 100 mg daily for gout   Follow up in 4 weeks with lab work before visit   Will get CT of Head

## 2016-01-08 NOTE — Progress Notes (Signed)
Careteam: Patient Care Team: Gayland Curry, DO as PCP - General (Geriatric Medicine) Monna Fam, MD as Consulting Physician (Ophthalmology) Pixie Casino, MD as Consulting Physician (Cardiology) [name], MD as Consulting Physician (Orthopedic Surgery)  Advanced Directive information Does patient have an advance directive?: Yes, Type of Advance Directive: [place];Living will  No Known Allergies  Chief Complaint  Patient presents with  . Medical Management of Chronic Issues    4 week follow up for gout and blood pressure.     HPI: Patient is a 82 y.o. female seen in the office today for follow up on blood pressure, swelling and gout. Pt was restarted back on HCTZ 12.5 mg daily due to increase edema in lower extremities.  Left leg remains swollen, no pain, no heat or redness noted.   Pt also had pain and swelling to 3rd finger on right hand was started on prednisone taper and pain and swelling improved. Pt with hx of gout and currently uric acid elevated.   Drinking a lot of fluids. Appetite has been good. Some weight loss since last visit however swelling overall has improved.   Following with PT due to knee replacement. Pain is better, comes and goes.   Notes swelling to left arm (also has more swelling in left leg) reports tenderness to arm and that she does not use it much because of pain and swelling with weakness   Review of Systems:  Review of Systems  Constitutional: Negative for chills and fever.       Reports good appetite today  HENT: Negative for congestion.   Eyes:       Glasses  Respiratory: Negative for shortness of breath (with exertion).   Cardiovascular: Positive for leg swelling. Negative for chest pain.  Gastrointestinal: Negative for abdominal pain, constipation and diarrhea.  Genitourinary: Negative for dysuria.  Musculoskeletal: Negative for joint swelling.  Skin: Negative for rash.  Neurological:  Positive for weakness (to left side). Negative for dizziness.    Past Medical History:  Diagnosis Date  . Arthritis    oa  . Chronic kidney disease    chronic kidney diseaase follow by primary md  . Diabetes mellitus without complication ([place])    Diet controlled  . Former smoker   . H/O ventricular fibrillation 10 yrs ago dx  . Hypertension   . Prediabetes   . Seasonal allergies   . TIA (transient ischemic attack) none recent   Past Surgical History:  Procedure Laterality Date  . NM MYOCAR PERF WALL MOTION  01/2008   lexiscan myoview - normal pattern of perfusion in all regions, EF 92%, no significant ischemia demonstated  . TONSILLECTOMY  age 68   and adenoids  . TOTAL KNEE ARTHROPLASTY Right 10/06/2015   Procedure: RIGHT TOTAL KNEE ARTHROPLASTY;  Surgeon: [name], MD;  Location: [facility];  Service: Orthopedics;  Laterality: Right;  . TRANSTHORACIC ECHOCARDIOGRAM  12/2005   mild DUST, EF=>55%; mild mitral annular calcif; mild-mod aortic root calcif   Social History:   reports that she quit smoking about 15 years ago. Her smoking use included Cigarettes. She has a 10.00 pack-year smoking history. She has never used smokeless tobacco. She reports that she does not drink alcohol or use drugs.  Family History  Problem Relation Age of Onset  . Congestive Heart Failure Mother   . Lung cancer Brother   . Prostate cancer Son   . Bipolar disorder Son   . Heart disease  Son     Medications: Patient's Medications  New Prescriptions   No medications on file  Previous Medications   AMLODIPINE (NORVASC) 5 MG TABLET    TAKE 1 TABLET BY MOUTH ONCE DAILY   CHOLECALCIFEROL (VITAMIN D) 1000 UNITS TABLET    Take 1,000 Units by mouth daily. D3   CLOPIDOGREL (PLAVIX) 75 MG TABLET    TAKE 1 TABLET BY MOUTH EVERY DAY   FOLIC ACID-VIT J1-OAC Z66 (FOLBEE) 2.5-25-1 MG TABS TABLET    Take 1 tablet by mouth daily.   HYDROCHLOROTHIAZIDE (HYDRODIURIL) 12.5 MG TABLET    Take 1 tablet  (12.5 mg total) by mouth daily.   KETOROLAC (ACULAR) 0.5 % OPHTHALMIC SOLUTION    Place 1 drop into the right eye 2 (two) times daily.   METOPROLOL SUCCINATE (TOPROL-XL) 50 MG 24 HR TABLET    TAKE 1 TABLET BY MOUTH ONCE DAILY  Modified Medications   No medications on file  Discontinued Medications   METHOCARBAMOL (ROBAXIN) 500 MG TABLET    Take 1 tablet (500 mg total) by mouth every 6 (six) hours as needed for muscle spasms.   PREDNISONE (DELTASONE) 5 MG TABLET    Take 4 tablets daily for 2 days then decrease by 1 tablet daily until compete   SACCHAROMYCES BOULARDII (FLORASTOR) 250 MG CAPSULE    Take 250 mg by mouth daily. Reported on 11/09/2015     Physical Exam:  Vitals:   01/08/16 1506  BP: 114/76  Pulse: 88  Resp: 17  Temp: 97.6 F (36.4 C)  TempSrc: Oral  Weight: 113 lb 9.6 oz (51.5 kg)  Height: _0  (1.702 m)   Body mass index is 17.79 kg/m.  Physical Exam  Constitutional: She is oriented to person, place, and time. No distress.  Thin tall black female  Cardiovascular: Normal rate and normal heart sounds.   Occasional irreg beat  Pulmonary/Chest: Effort normal and breath sounds normal. No respiratory distress.  Abdominal: Soft. Bowel sounds are normal.  Musculoskeletal: She exhibits edema (1+ edema to left arm and lower leg).  Slow gait  Neurological: She is alert and oriented to person, place, and time.  Weakness to left arm   Skin: Skin is warm and dry.  Psychiatric: She has a normal mood and affect.    Labs reviewed: Basic Metabolic Panel:  Recent Labs  01/13/15 1130  09/04/15 0901  11/03/15 1334 11/24/15 1121 01/04/16 0905  NA 142  < > 142  < > 138 140 141  K 3.8  < > 3.8  < > 4.1 3.6 3.1*  CL 99  < > 98  < > 96 103 102  CO2 25  < > 26  < > _1 GLUCOSE 113*  < > 102*  < > 181* 101* 106*  BUN 29*  < > 36*  < > 40* 37* 28*  CREATININE 1.73*  < > 1.96*  < > 2.24* 1.77* 1.52*  CALCIUM 10.3  < > 10.4*  < > 10.5* 9.9 9.4  TSH 2.080  --  2.770  --    --   --   --   < > = values in this interval not displayed. Liver Function Tests:  Recent Labs  02/27/15 1150 05/08/15 0910 09/04/15 0901 10/12/15  AST _2 ALT _3 ALKPHOS 56 55 51 59  BILITOT 0.4 0.4 0.6  --   PROT 6.7 6.1 6.5  --   ALBUMIN 4.3  4.1 4.3  --    No results for input(s): LIPASE, AMYLASE in the last 8760 hours. No results for input(s): AMMONIA in the last 8760 hours. CBC:  Recent Labs  09/04/15 0901  09/29/15 0930 10/07/15 0410 10/12/15 11/03/15 1334  WBC 5.6  < > 7.2 9.7 8.3 6.9  NEUTROABS 4.4  --   --   --  7 5.2  HGB  --   --  14.0 11.9* 10.0*  --   HCT 44.2  < > 40.9 36.0 31* 34.6  MCV 94  --  90.5 92.1  --  92  PLT 217  < > 215 239 327 293  < > = values in this interval not displayed. Lipid Panel:  Recent Labs  01/13/15 1130 05/08/15 0910 09/04/15 0901  CHOL 163 156 151  HDL 81 78 85  LDLCALC 59 59 53  TRIG 115 97 66  CHOLHDL 2.0 2.0 1.8   TSH:  Recent Labs  01/13/15 1130 09/04/15 0901  TSH 2.080 2.770   A1C: Lab Results  Component Value Date   HGBA1C 6.5 (H) 09/04/2015     Assessment/Plan 1. Acute gout of right hand, unspecified cause -allopurinol 100 mg daily - Uric acid; Future  2. Essential hypertension -blood pressure stable, due to fluid retention will stop norvasc - potassium chloride SA (K-DUR,KLOR-CON) 10 MEQ tablet; Take 1 tablet (10 mEq total) by mouth daily.  Dispense: 30 tablet; Refill: 3 - BMP with eGFR; Future  3. Lower leg edema Started back on HCTZ, renal function stable. To cont HCTZ and start potassium 10 meq daily  - potassium chloride SA (K-DUR,KLOR-CON) 10 MEQ tablet; Take 1 tablet (10 mEq total) by mouth daily.  Dispense: 30 tablet; Refill: 3 - BMP with eGFR; Future  4. Left-sided weakness With swelling noted to left side - CT Head Wo Contrast; Future  Follow up with Dr Mariea Clonts in 4 weeks with lab work prior to visit   Maricela Schreur K. Harle Battiest  [facility] [phone] 8 am - 5 pm) [phone] (after hours)

## 2016-01-10 ENCOUNTER — Encounter: Payer: Medicare Other | Admitting: Physical Therapy

## 2016-01-15 ENCOUNTER — Ambulatory Visit: Payer: Medicare Other

## 2016-01-15 DIAGNOSIS — M25661 Stiffness of right knee, not elsewhere classified: Secondary | ICD-10-CM

## 2016-01-15 DIAGNOSIS — M25561 Pain in right knee: Secondary | ICD-10-CM

## 2016-01-15 DIAGNOSIS — M6281 Muscle weakness (generalized): Secondary | ICD-10-CM

## 2016-01-15 DIAGNOSIS — R2681 Unsteadiness on feet: Secondary | ICD-10-CM

## 2016-01-15 DIAGNOSIS — R262 Difficulty in walking, not elsewhere classified: Secondary | ICD-10-CM

## 2016-01-15 NOTE — Therapy (Signed)
[place], [name], [address] Phone: [phone]   Fax:  [phone]  Physical Therapy Treatment  Patient Details  Name: Mia [name] MRN: [id number] Date of Birth: May 11, 1926 Referring Provider: Jean Rosenthal, MD  Encounter Date: 01/15/2016      PT End of Session - 01/15/16 1137    Visit Number 13   Number of Visits 24   Date for PT Re-Evaluation 02/16/16   Authorization Type UHC Medicare   Authorization Time Period KX at visit 15   PT Start Time 1140   PT Stop Time 1240   PT Time Calculation (min) 60 min   Activity Tolerance Patient tolerated treatment well   Behavior During Therapy [facility] for tasks assessed/performed      Past Medical History:  Diagnosis Date  . Arthritis    oa  . Chronic kidney disease    chronic kidney diseaase follow by primary md  . Diabetes mellitus without complication ([place])    Diet controlled  . Former smoker   . H/O ventricular fibrillation 10 yrs ago dx  . Hypertension   . Prediabetes   . Seasonal allergies   . TIA (transient ischemic attack) none recent    Past Surgical History:  Procedure Laterality Date  . NM MYOCAR PERF WALL MOTION  01/2008   lexiscan myoview - normal pattern of perfusion in all regions, EF 92%, no significant ischemia demonstated  . TONSILLECTOMY  age 77   and adenoids  . TOTAL KNEE ARTHROPLASTY Right 10/06/2015   Procedure: RIGHT TOTAL KNEE ARTHROPLASTY;  Surgeon: [name], MD;  Location: [facility];  Service: Orthopedics;  Laterality: Right;  . TRANSTHORACIC ECHOCARDIOGRAM  12/2005   mild DUST, EF=>55%; mild mitral annular calcif; mild-mod aortic root calcif    There were no vitals filed for this visit.      Subjective Assessment - 01/15/16 1155    Subjective Everything about the same   Currently in Pain? No/denies   Multiple Pain Sites No                         [facility] Adult PT Treatment/Exercise -  01/15/16 1156      Knee/Hip Exercises: Stretches   Active Hamstring Stretch Right;30 seconds;2 reps   Hip Flexor Stretch 3 reps;30 seconds   Hip Flexor Stretch Limitations thomas position.      Knee/Hip Exercises: Aerobic   Nustep L6 x 5 min   Stepper L5 13 min LE only     Knee/Hip Exercises: Seated   Long Arc Quad Right;20 reps  more like SAQ due to leg length   Long Arc Quad Weight 5 lbs.   Long [employer] Limitations 10 sec holds x 10 sec at end   Sit to [employer] 1 set;10 reps  cued for control      Knee/Hip Exercises: Supine   Quad Sets Right;15 reps   Other Supine Knee/Hip Exercises hip rotation bilateral      Moist Heat Therapy   Number Minutes Moist Heat 15 Minutes   Moist Heat Location Knee     Manual Therapy   Joint Mobilization Right knee extension mobs supine, patella mobs   Passive ROM passive hip flexor/quad stretch 2 x 30 sec , knee extension PROM/overpressure                  PT Short Term Goals - 12/29/15 1241      PT SHORT TERM GOAL #  1   Title She will be independent with intal HEP   Time 4   Period Weeks   Status Partially Met     PT SHORT TERM GOAL #2   Title She will have active flexion to 115 degrees   Baseline 108   Time 4   Period Weeks   Status Partially Met     PT SHORT TERM GOAL #3   Title She will be able to extend knee actively to -10 degrees for improved stability with walking   Baseline -15   Period Weeks   Status On-going     PT SHORT TERM GOAL #4   Title She will walk safely in home wih SPC   Time 4   Period Weeks   Status Achieved     PT SHORT TERM GOAL #5   Title She will report pain decreased 25% or more overall   Time 4   Period Weeks   Status Achieved           PT Long Term Goals - 01/15/16 1228      PT LONG TERM GOAL #1   Title She will be independent with all HEP issued   Status On-going     PT LONG TERM GOAL #2   Title She will report pain as intermittant without pain meds    Status Achieved      PT LONG TERM GOAL #3   Title She will walk in home with no device and in community with [facility] safely   Status Achieved     PT LONG TERM GOAL #4   Title She will report doing all normal home tasks with 1-2/10 max pain and no device   Status Achieved     PT LONG TERM GOAL #5   Title She will return to normal community activty   Status Partially Met               Plan - 01/15/16 1138    Clinical Impression Statement RT knee continues to be stiff in both directions . She is tolerant of stretching but pain limits at times. Continue stretching hip and knees    PT Treatment/Interventions Cryotherapy;Quarry manager;Therapeutic exercise;Manual techniques;Taping;Passive range of motion;Patient/family education   PT Next Visit Plan Cont hip and knee ROM , strength and balance activity   Consulted and Agree with Plan of Care Patient      Patient will benefit from skilled therapeutic intervention in order to improve the following deficits and impairments:  Decreased range of motion, Difficulty walking, Pain, Decreased activity tolerance, Increased edema, Decreased strength  Visit Diagnosis: Difficulty in walking, not elsewhere classified  Muscle weakness (generalized)  Stiffness of right knee, not elsewhere classified  Pain in right knee  Unsteadiness on feet     Problem List Patient Active Problem List   Diagnosis Date Noted  . Status post total right knee replacement 10/06/2015  . RBBB 09/06/2015  . Preoperative cardiovascular examination 09/06/2015  . Mild cognitive impairment with memory loss 09/04/2015  . Loss of weight 09/04/2015  . Mild cognitive impairment 07/15/2014  . Primary osteoarthritis of right knee 07/15/2014  . Xerosis cutis 07/15/2014  . Superficial burn of groin 11/24/2013  . B12 deficiency 09/23/2013  . Essential hypertension, benign 09/23/2013  . PVC's (premature ventricular contractions) 09/23/2013  . Vitamin D  deficiency 09/23/2013  . Hyperglycemia 09/23/2013  . History of TIAs 08/06/2013  . DOE (dyspnea on exertion) 08/06/2013    Caprice Red PT  01/15/2016, 12:30 PM  [place], [name], [address] Phone: [phone]   Fax:  [phone]  Name: Mia [name] MRN: [id number] Date of Birth: 1926-03-22

## 2016-01-17 ENCOUNTER — Ambulatory Visit: Payer: Medicare Other | Admitting: Physical Therapy

## 2016-01-17 DIAGNOSIS — M6281 Muscle weakness (generalized): Secondary | ICD-10-CM

## 2016-01-17 DIAGNOSIS — M25661 Stiffness of right knee, not elsewhere classified: Secondary | ICD-10-CM

## 2016-01-17 DIAGNOSIS — R262 Difficulty in walking, not elsewhere classified: Secondary | ICD-10-CM

## 2016-01-17 DIAGNOSIS — M25561 Pain in right knee: Secondary | ICD-10-CM | POA: Diagnosis not present

## 2016-01-17 DIAGNOSIS — R2681 Unsteadiness on feet: Secondary | ICD-10-CM

## 2016-01-17 NOTE — Therapy (Signed)
[facility] [address] [place], [place], [address] Phone: [phone]   Fax:  [phone]  Physical Therapy Treatment  Patient Details  Name: Mia [name] MRN: [id number] Date of Birth: [birth year] Referring Provider: Doneen Poisson, MD  Encounter Date: 01/17/2016      PT End of Session - 01/17/16 1215    Visit Number 14   Number of Visits 24   Date for PT Re-Evaluation 02/16/16   Authorization Type UHC Medicare   Authorization Time Period KX at visit 15   PT Start Time 1150   PT Stop Time 1245   PT Time Calculation (min) 55 min      Past Medical History:  Diagnosis Date  . Arthritis    oa  . Chronic kidney disease    chronic kidney diseaase follow by primary md  . Diabetes mellitus without complication ([facility])    Diet controlled  . Former smoker   . H/O ventricular fibrillation 10 yrs ago dx  . Hypertension   . Prediabetes   . Seasonal allergies   . TIA (transient ischemic attack) none recent    Past Surgical History:  Procedure Laterality Date  . NM MYOCAR PERF WALL MOTION  01/2008   lexiscan myoview - normal pattern of perfusion in all regions, EF 92%, no significant ischemia demonstated  . TONSILLECTOMY  age 75   and adenoids  . TOTAL KNEE ARTHROPLASTY Right 10/06/2015   Procedure: RIGHT TOTAL KNEE ARTHROPLASTY;  Surgeon: Kathryne Hitch, MD;  Location: [facility];  Service: Orthopedics;  Laterality: Right;  . TRANSTHORACIC ECHOCARDIOGRAM  12/2005   mild DUST, EF=>55%; mild mitral annular calcif; mild-mod aortic root calcif    There were no vitals filed for this visit.      Subjective Assessment - 01/17/16 1215    Currently in Pain? No/denies            [facility] PT Assessment - 01/17/16 0001      AROM   Right Knee Extension -12   Right Knee Flexion 110     PROM   Right Knee Flexion 115                     [facility] Adult PT Treatment/Exercise - 01/17/16 0001      Knee/Hip  Exercises: Stretches   Active Hamstring Stretch 3 reps;30 seconds   Active Hamstring Stretch Limitations passive, bilateral   Hip Flexor Stretch 3 reps;30 seconds   Hip Flexor Stretch Limitations edge of mat thomas, then passive quad stretch 2 x 30 sec    Other Knee/Hip Stretches butterfly stretch and hip adductor stretch passively 3x 30 sec each     Knee/Hip Exercises: Aerobic   Nustep L5 x 10 min     Knee/Hip Exercises: Seated   Other Seated Knee/Hip Exercises quad set with heel on floor x10     Knee/Hip Exercises: Supine   Quad Sets Right;15 reps   Terminal Knee Extension 10 reps   Straight Leg Raises 10 reps;2 sets   Straight Leg Raises Limitations with initial quad set   Other Supine Knee/Hip Exercises hip rotation bilateral    Other Supine Knee/Hip Exercises 5# weight on knee to increase extension x 2 min, rest, 2 minutes, difficult     Moist Heat Therapy   Number Minutes Moist Heat 15 Minutes   Moist Heat Location Knee     Manual Therapy   Joint Mobilization Right knee extension mobs supine, patella mobs   Passive  ROM extension overpressure                  PT Short Term Goals - 12/29/15 1241      PT SHORT TERM GOAL #1   Title She will be independent with intal HEP   Time 4   Period Weeks   Status Partially Met     PT SHORT TERM GOAL #2   Title She will have active flexion to 115 degrees   Baseline 108   Time 4   Period Weeks   Status Partially Met     PT SHORT TERM GOAL #3   Title She will be able to extend knee actively to -10 degrees for improved stability with walking   Baseline -15   Period Weeks   Status On-going     PT SHORT TERM GOAL #4   Title She will walk safely in home wih SPC   Time 4   Period Weeks   Status Achieved     PT SHORT TERM GOAL #5   Title She will report pain decreased 25% or more overall   Time 4   Period Weeks   Status Achieved           PT Long Term Goals - 01/15/16 1228      PT LONG TERM GOAL #1    Title She will be independent with all HEP issued   Status On-going     PT LONG TERM GOAL #2   Title She will report pain as intermittant without pain meds    Status Achieved     PT LONG TERM GOAL #3   Title She will walk in home with no device and in community with [facility] safely   Status Achieved     PT LONG TERM GOAL #4   Title She will report doing all normal home tasks with 1-2/10 max pain and no device   Status Achieved     PT LONG TERM GOAL #5   Title She will return to normal community activty   Status Partially Met               Plan - 01/17/16 1456    Clinical Impression Statement Slight improvements in ROM after manual today. Limited by pain with knee ROM.    PT Next Visit Plan Cont hip and knee ROM , strength and balance activity      Patient will benefit from skilled therapeutic intervention in order to improve the following deficits and impairments:  Decreased range of motion, Difficulty walking, Pain, Decreased activity tolerance, Increased edema, Decreased strength  Visit Diagnosis: Difficulty in walking, not elsewhere classified  Muscle weakness (generalized)  Stiffness of right knee, not elsewhere classified  Pain in right knee  Unsteadiness on feet     Problem List Patient Active Problem List   Diagnosis Date Noted  . Status post total right knee replacement 10/06/2015  . RBBB 09/06/2015  . Preoperative cardiovascular examination 09/06/2015  . Mild cognitive impairment with memory loss 09/04/2015  . Loss of weight 09/04/2015  . Mild cognitive impairment 07/15/2014  . Primary osteoarthritis of right knee 07/15/2014  . Xerosis cutis 07/15/2014  . Superficial burn of groin 11/24/2013  . B12 deficiency 09/23/2013  . Essential hypertension, benign 09/23/2013  . PVC's (premature ventricular contractions) 09/23/2013  . Vitamin D deficiency 09/23/2013  . Hyperglycemia 09/23/2013  . History of TIAs 08/06/2013  . DOE (dyspnea on exertion)  08/06/2013    Dorene Ar, PTA  01/16/2026, 2:57 PM  [name], [name], [address] Phone: [phone]   Fax:  [phone]  Name: Mia [name] MRN: [id number] Date of Birth: [birth year]

## 2016-01-18 ENCOUNTER — Ambulatory Visit
Admission: RE | Admit: 2016-01-18 | Discharge: 2016-01-18 | Disposition: A | Discharge status: Home / Self Care | Payer: Medicare Other | Source: Ambulatory Visit | Attending: Nurse Practitioner | Admitting: Nurse Practitioner

## 2016-01-18 DIAGNOSIS — R531 Weakness: Secondary | ICD-10-CM

## 2016-01-18 IMAGING — CT CT HEAD W/O CM
3 series · 15 of 43 positions shown, 18 images · non-contrast
Comparison: [DATE]

CLINICAL DATA: TIA, LEFT-sided weakness for several months, history
hypertension, former smoker, diabetes mellitus

EXAM:
CT HEAD WITHOUT CONTRAST
TECHNIQUE: Contiguous axial images were obtained from the base of the skull
through the vertex without intravenous contrast.

[Series 2: head w/(date) · axial · 0.41mm/px · z∈[+1061,+1166]mm · 9 of 26 slices shown, 12 images]
[im 3/26  brain]
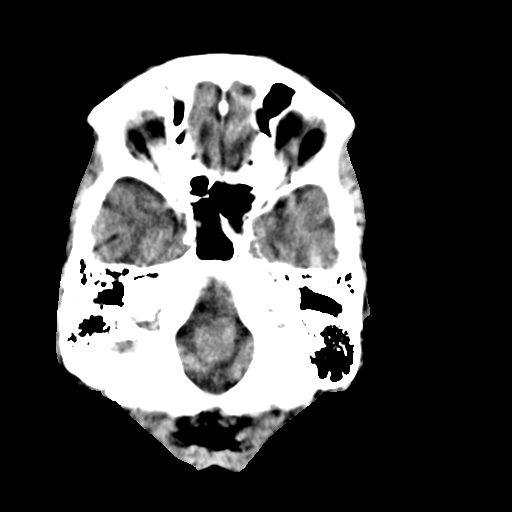
[im 3/26  bone]
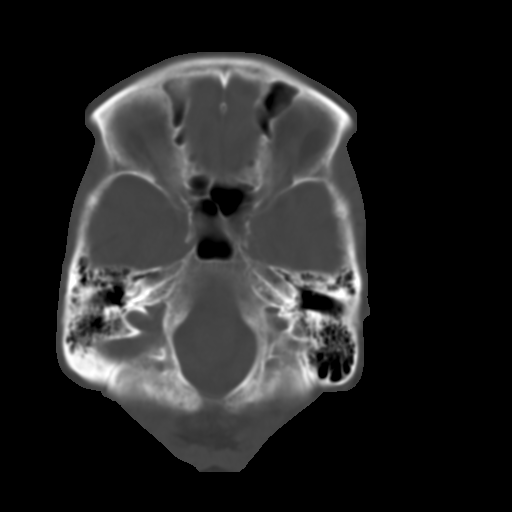
[im 6/26  brain]
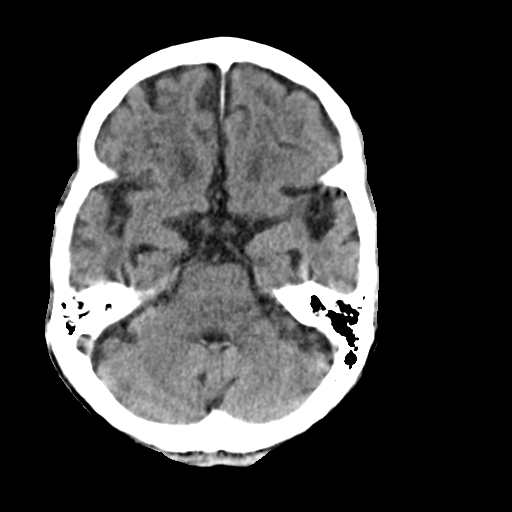
[im 8/26  brain]
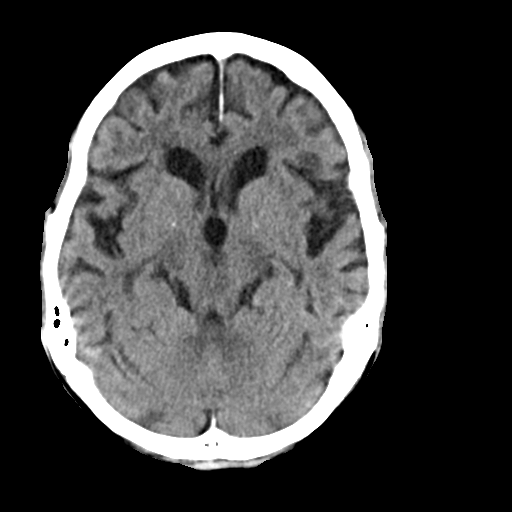
[im 11/26  brain]
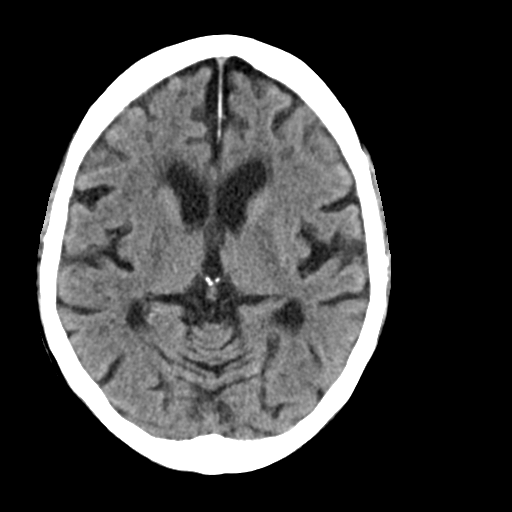
[im 14/26  brain]
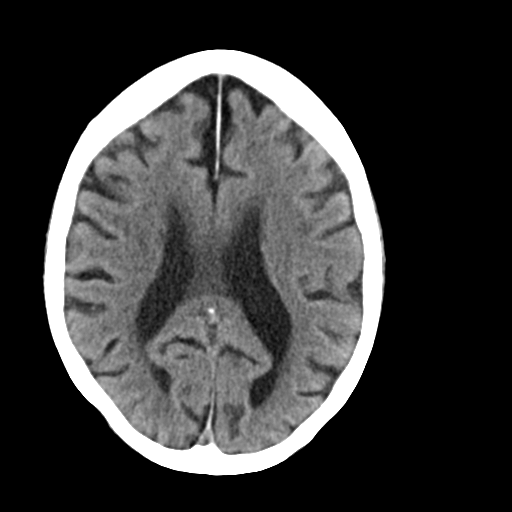
[im 14/26  bone]
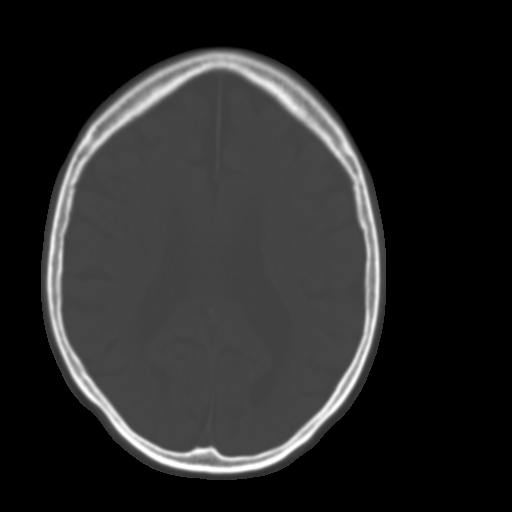
[im 16/26  brain]
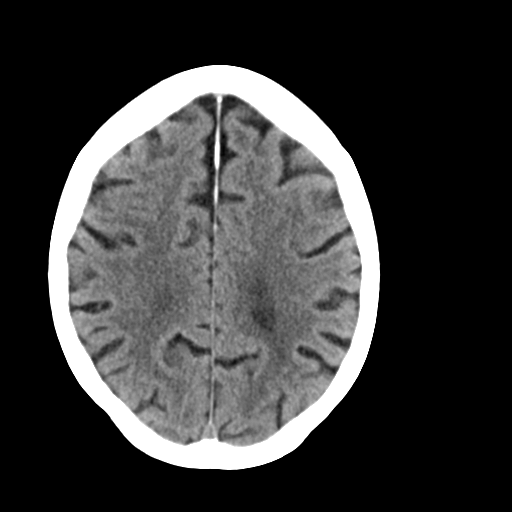
[im 19/26  brain]
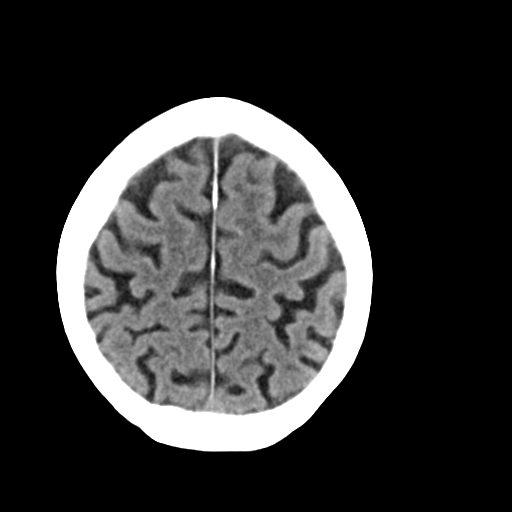
[im 22/26  brain]
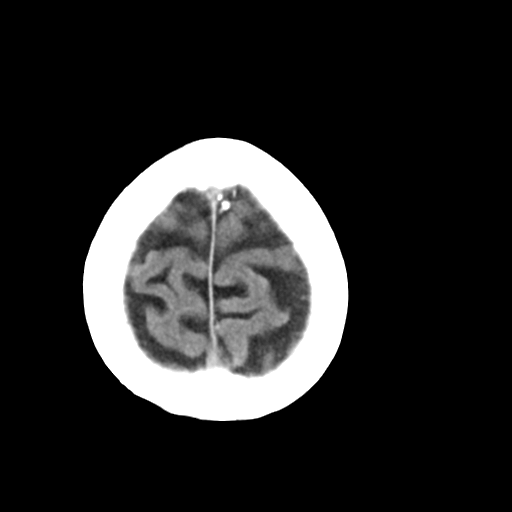
[im 24/26  brain]
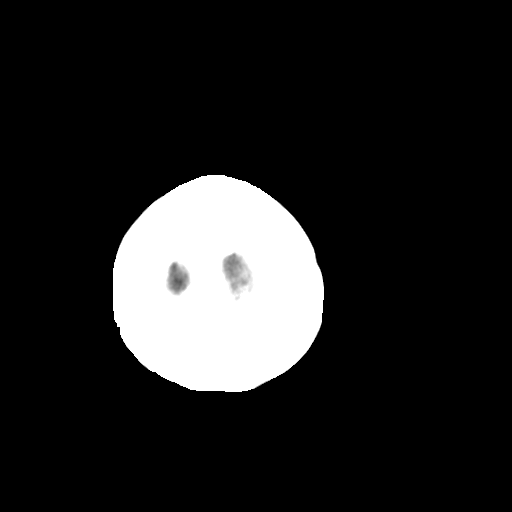
[im 24/26  bone]
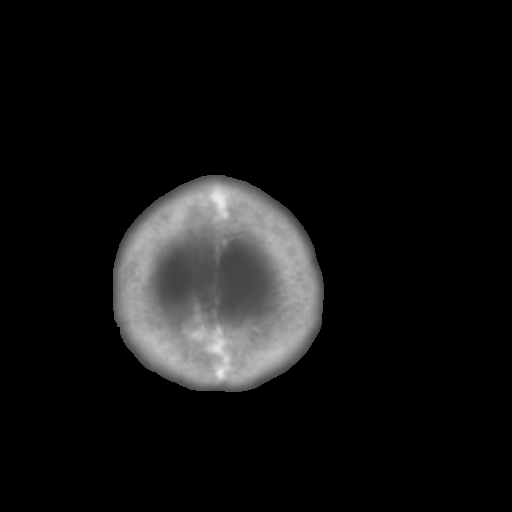

[Series 4: cor · coronal · 0.26mm/px · 3 of 62 slices shown]
[im 21/62  brain]
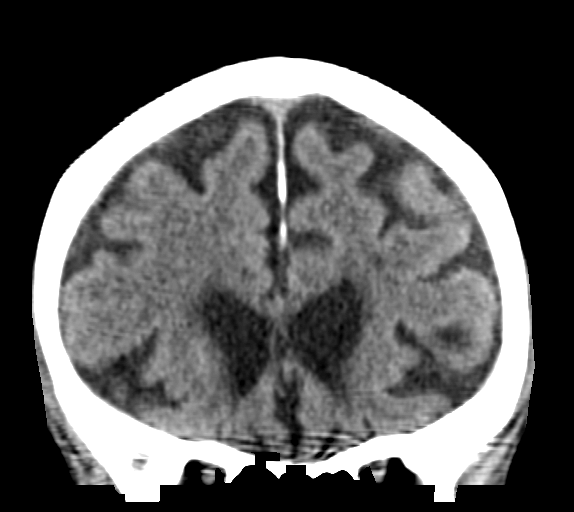
[im 28/62  brain]
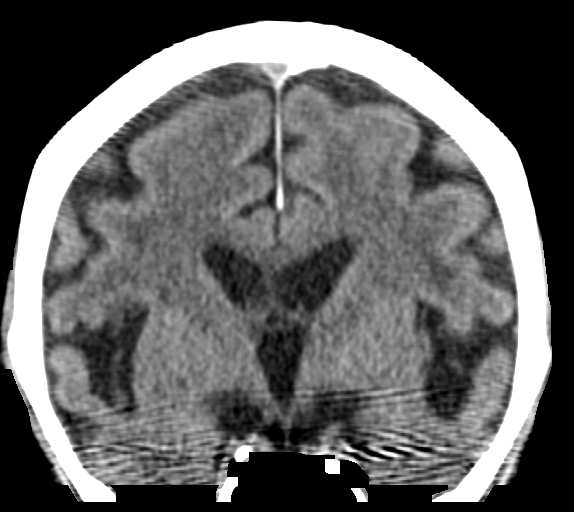
[im 34/62  brain]
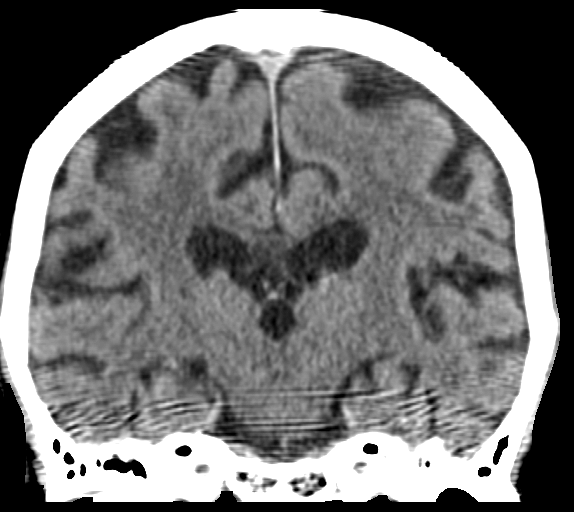

[Series 5: sag · sagittal · 0.28mm/px · 3 of 50 slices shown]
[im 17/50  brain]
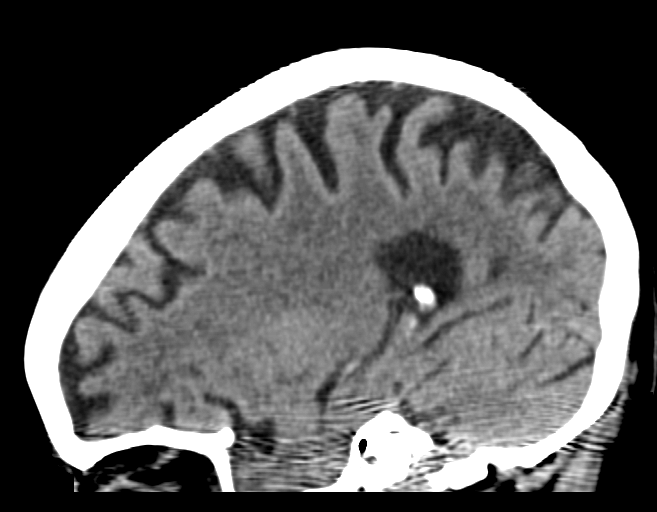
[im 25/50  brain]
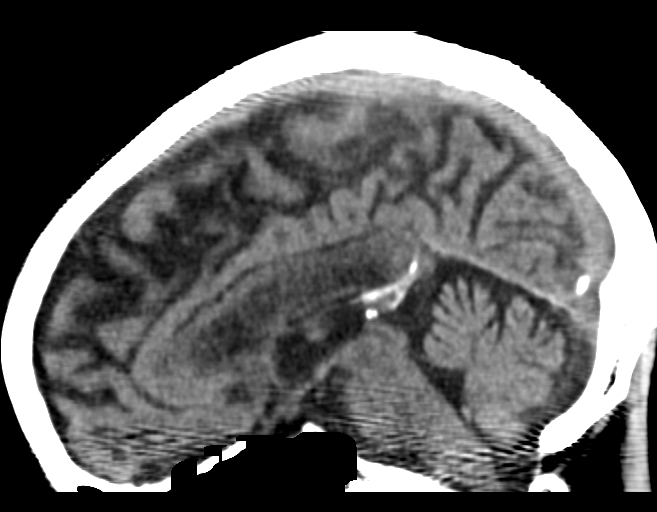
[im 33/50  brain]
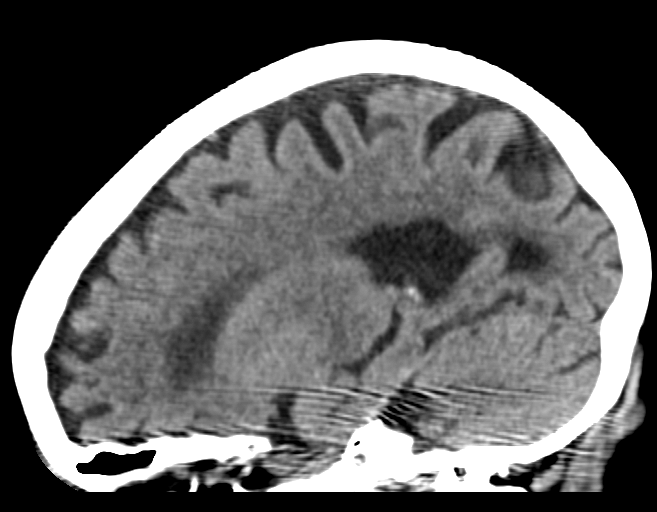

[15 of 43 positions shown; findings below may reference images not displayed]

FINDINGS: Generalized atrophy.

Normal ventricular morphology.

No midline shift or mass effect.

Probable mild small vessel chronic ischemic changes of deep cerebral
white matter.

No intracranial hemorrhage, mass lesion, or evidence acute
infarction.

No extra-axial fluid collections.

Visualized paranasal sinuses and mastoid air cells clear.

Bones unremarkable.
IMPRESSION: Atrophy with probable small vessel chronic ischemic changes of deep
cerebral white matter.

No acute intracranial abnormalities.

## 2016-01-19 ENCOUNTER — Other Ambulatory Visit: Payer: Medicare Other

## 2016-01-23 ENCOUNTER — Ambulatory Visit: Payer: Medicare Other | Admitting: Nurse Practitioner

## 2016-01-24 ENCOUNTER — Ambulatory Visit: Payer: Medicare Other | Attending: Orthopaedic Surgery

## 2016-01-24 DIAGNOSIS — M25561 Pain in right knee: Secondary | ICD-10-CM

## 2016-01-24 DIAGNOSIS — R2681 Unsteadiness on feet: Secondary | ICD-10-CM | POA: Diagnosis present

## 2016-01-24 DIAGNOSIS — M25661 Stiffness of right knee, not elsewhere classified: Secondary | ICD-10-CM | POA: Diagnosis present

## 2016-01-24 DIAGNOSIS — M6281 Muscle weakness (generalized): Secondary | ICD-10-CM | POA: Diagnosis present

## 2016-01-24 DIAGNOSIS — R262 Difficulty in walking, not elsewhere classified: Secondary | ICD-10-CM | POA: Diagnosis present

## 2016-01-24 NOTE — Therapy (Signed)
[place], [name], [address] Phone: [phone]   Fax:  [phone]  Physical Therapy Treatment  Patient Details  Name: Mia [name] MRN: [id number] Date of Birth: [birth year] Referring Provider: Jean Rosenthal, MD  Encounter Date: 01/24/2016      PT End of Session - 01/24/16 1031    Visit Number 15   Number of Visits 24   Date for PT Re-Evaluation 02/16/16   Authorization Type [employer] Medicare   Authorization Time Period KX at visit 15   PT Start Time 1022   PT Stop Time 1100   PT Time Calculation (min) 38 min   Activity Tolerance Patient tolerated treatment well   Behavior During Therapy [facility] for tasks assessed/performed      Past Medical History:  Diagnosis Date  . Arthritis    oa  . Chronic kidney disease    chronic kidney diseaase follow by primary md  . Diabetes mellitus without complication ([place])    Diet controlled  . Former smoker   . H/O ventricular fibrillation 10 yrs ago dx  . Hypertension   . Prediabetes   . Seasonal allergies   . TIA (transient ischemic attack) none recent    Past Surgical History:  Procedure Laterality Date  . NM MYOCAR PERF WALL MOTION  01/2008   lexiscan myoview - normal pattern of perfusion in all regions, EF 92%, no significant ischemia demonstated  . TONSILLECTOMY  age 63   and adenoids  . TOTAL KNEE ARTHROPLASTY Right 10/06/2015   Procedure: RIGHT TOTAL KNEE ARTHROPLASTY;  Surgeon: [name], MD;  Location: [facility];  Service: Orthopedics;  Laterality: Right;  . TRANSTHORACIC ECHOCARDIOGRAM  12/2005   mild DUST, EF=>55%; mild mitral annular calcif; mild-mod aortic root calcif    There were no vitals filed for this visit.      Subjective Assessment - 01/24/16 1030    Subjective No new complaints . still end range pain   Currently in Pain? No/denies            [facility] PT Assessment - 01/24/16 0001      AROM   Right Knee Extension  -12   Right Knee Flexion 110     PROM   Right Knee Extension -8   Right Knee Flexion 114                     [facility] Adult PT Treatment/Exercise - 01/24/16 0001      Knee/Hip Exercises: Aerobic   Recumbent Bike 1 min but she had difficulty flexion knee without pain so stopped     Knee/Hip Exercises: Standing   Side Lunges Right;Left  8 reps  each   Side Lunges Limitations then squats in place x 12    Other Standing Knee Exercises Marching  with high knees. x 25 feet and backward x 20 feet  then walking with cues to incr pace and incr stride length 300 feet     Knee/Hip Exercises: Seated   Other Seated Knee/Hip Exercises quad set with heel on floor x10 and SLR x 15 reps    Sit to Sand 15 reps  8 with arms and 8 without arms     Knee/Hip Exercises: Supine   Terminal Knee Extension Right;2 sets;10 reps  with quad sets   Straight Leg Raises 10 reps;2 sets   Straight Leg Raises Limitations with initial quad set   Other Supine Knee/Hip Exercises hip rotation bilateral x 10 rT  and LT IR /ER     Moist Heat Therapy   Number Minutes Moist Heat 15 Minutes   Moist Heat Location Knee  RT     Manual Therapy   Joint Mobilization Right knee extension mobs supine, patella mobs   Passive ROM extension overpressure                  PT Short Term Goals - 12/29/15 1241      PT SHORT TERM GOAL #1   Title She will be independent with intal HEP   Time 4   Period Weeks   Status Partially Met     PT SHORT TERM GOAL #2   Title She will have active flexion to 115 degrees   Baseline 108   Time 4   Period Weeks   Status Partially Met     PT SHORT TERM GOAL #3   Title She will be able to extend knee actively to -10 degrees for improved stability with walking   Baseline -15   Period Weeks   Status On-going     PT SHORT TERM GOAL #4   Title She will walk safely in home wih SPC   Time 4   Period Weeks   Status Achieved     PT SHORT TERM GOAL #5   Title She  will report pain decreased 25% or more overall   Time 4   Period Weeks   Status Achieved           PT Long Term Goals - 01/15/16 1228      PT LONG TERM GOAL #1   Title She will be independent with all HEP issued   Status On-going     PT LONG TERM GOAL #2   Title She will report pain as intermittant without pain meds    Status Achieved     PT LONG TERM GOAL #3   Title She will walk in home with no device and in community with [facility] safely   Status Achieved     PT LONG TERM GOAL #4   Title She will report doing all normal home tasks with 1-2/10 max pain and no device   Status Achieved     PT LONG TERM GOAL #5   Title She will return to normal community activty   Status Partially Met               Plan - 01/24/16 1032    Clinical Impression Statement No significnat change in ROM . Did well with exercisse and was able to incr. speed with effort but i did not encourage this at home for possible fall risk.    PT Treatment/Interventions Cryotherapy;Quarry manager;Therapeutic exercise;Manual techniques;Taping;Passive range of motion;Patient/family education   PT Next Visit Plan Cont hip and knee ROM , strength and balance activity   Consulted and Agree with Plan of Care Patient      Patient will benefit from skilled therapeutic intervention in order to improve the following deficits and impairments:  Decreased range of motion, Difficulty walking, Pain, Decreased activity tolerance, Increased edema, Decreased strength, Increased muscle spasms  Visit Diagnosis: Difficulty in walking, not elsewhere classified  Muscle weakness (generalized)  Pain in right knee  Stiffness of right knee, not elsewhere classified     Problem List Patient Active Problem List   Diagnosis Date Noted  . Status post total right knee replacement 10/06/2015  . RBBB 09/06/2015  . Preoperative cardiovascular examination 09/06/2015  . Mild  cognitive  impairment with memory loss 09/04/2015  . Loss of weight 09/04/2015  . Mild cognitive impairment 07/15/2014  . Primary osteoarthritis of right knee 07/15/2014  . Xerosis cutis 07/15/2014  . Superficial burn of groin 11/24/2013  . B12 deficiency 09/23/2013  . Essential hypertension, benign 09/23/2013  . PVC's (premature ventricular contractions) 09/23/2013  . Vitamin D deficiency 09/23/2013  . Hyperglycemia 09/23/2013  . History of TIAs 08/06/2013  . DOE (dyspnea on exertion) 08/06/2013    [name]  PT 01/24/2016, 11:04 AM  [facility]: [place] [address] [place], [name], [address] Phone: [phone]   Fax:  [phone]  Name: Keriann Rankin MRN: [id number] Date of Birth: April 19, 1926

## 2016-01-25 ENCOUNTER — Other Ambulatory Visit: Payer: Medicare Other

## 2016-01-25 DIAGNOSIS — M109 Gout, unspecified: Secondary | ICD-10-CM

## 2016-01-25 DIAGNOSIS — R600 Localized edema: Secondary | ICD-10-CM

## 2016-01-25 DIAGNOSIS — I10 Essential (primary) hypertension: Secondary | ICD-10-CM

## 2016-01-25 LAB — BASIC METABOLIC PANEL WITH GFR
BUN: 23 mg/dL (ref 7–25)
CO2: 28 mmol/L (ref 20–31)
Calcium: 9.8 mg/dL (ref 8.6–10.4)
Chloride: 106 mmol/L (ref 98–110)
Creat: 1.32 mg/dL — ABNORMAL HIGH (ref 0.60–0.88)
GFR, Est African American: 42 mL/min — ABNORMAL LOW (ref >=60)
GFR, Est Non African American: 36 mL/min — ABNORMAL LOW (ref >=60)
Glucose, Bld: 98 mg/dL (ref 65–99)
Potassium: 3.8 mmol/L (ref 3.5–5.3)
Sodium: 144 mmol/L (ref 135–146)

## 2016-01-25 LAB — URIC ACID: Uric Acid, Serum: 5.7 mg/dL (ref 2.5–7.0)

## 2016-01-26 ENCOUNTER — Ambulatory Visit: Payer: Medicare Other

## 2016-01-26 DIAGNOSIS — M6281 Muscle weakness (generalized): Secondary | ICD-10-CM

## 2016-01-26 DIAGNOSIS — R262 Difficulty in walking, not elsewhere classified: Secondary | ICD-10-CM | POA: Diagnosis not present

## 2016-01-26 DIAGNOSIS — M25661 Stiffness of right knee, not elsewhere classified: Secondary | ICD-10-CM

## 2016-01-26 NOTE — Therapy (Signed)
Mia [name], [name], [address] Phone: [phone]   Fax:  [phone]  Physical Therapy Treatment  Patient Details  Name: Mia [name] MRN: [id number] Date of Birth: 1926-02-09 Referring Provider: Jean Rosenthal, MD  Encounter Date: 01/26/2016      PT End of Session - 01/26/16 1023    Visit Number 16   Number of Visits 24   Date for PT Re-Evaluation 02/16/16   Authorization Time Period KX at visit 15   PT Start Time 1016   PT Stop Time 1100   PT Time Calculation (min) 44 min   Activity Tolerance Patient tolerated treatment well   Behavior During Therapy [name] for tasks assessed/performed      Past Medical History:  Diagnosis Date  . Arthritis    oa  . Chronic kidney disease    chronic kidney diseaase follow by primary md  . Diabetes mellitus without complication ([place])    Diet controlled  . Former smoker   . H/O ventricular fibrillation 10 yrs ago dx  . Hypertension   . Prediabetes   . Seasonal allergies   . TIA (transient ischemic attack) none recent    Past Surgical History:  Procedure Laterality Date  . NM MYOCAR PERF WALL MOTION  01/2008   lexiscan myoview - normal pattern of perfusion in all regions, EF 92%, no significant ischemia demonstated  . TONSILLECTOMY  age 77   and adenoids  . TOTAL KNEE ARTHROPLASTY Right 10/06/2015   Procedure: RIGHT TOTAL KNEE ARTHROPLASTY;  Surgeon: Mia Rossetti, MD;  Location: [facility];  Service: Orthopedics;  Laterality: Right;  . TRANSTHORACIC ECHOCARDIOGRAM  12/2005   mild DUST, EF=>55%; mild mitral annular calcif; mild-mod aortic root calcif    There were no vitals filed for this visit.      Subjective Assessment - 01/26/16 1025    Subjective No general pain just with end range stretch   Currently in Pain? No/denies                         [name] Adult PT Treatment/Exercise - 01/26/16 1024      Ambulation/Gait   Gait  Comments Walking with CG and belt with cues to incr speed , also walked outside to sttep walkway forward and back up and down walkway . Some LOB but caught immediatly by PT  so no risk of fall.      Knee/Hip Exercises: Aerobic   Nustep L5 x 7 min     Knee/Hip Exercises: Standing   Side Lunges Right;Left   Side Lunges Limitations then squats in place x 12    Forward Step Up Right;Hand Hold: 1;Step Height: 6"   Forward Step Up Limitations x25 working on extending knee and hip  with verbal cues and tactile cuse and demo   Other Standing Knee Exercises Single leg hip hinge and knee flex and ext in hip hinge position                  PT Short Term Goals - 12/29/15 1241      PT SHORT TERM GOAL #1   Title She will be independent with intal HEP   Time 4   Period Weeks   Status Partially Met     PT SHORT TERM GOAL #2   Title She will have active flexion to 115 degrees   Baseline 108   Time 4   Period Weeks   Status  Partially Met     PT SHORT TERM GOAL #3   Title She will be able to extend knee actively to -10 degrees for improved stability with walking   Baseline -15   Period Weeks   Status On-going     PT SHORT TERM GOAL #4   Title She will walk safely in home wih SPC   Time 4   Period Weeks   Status Achieved     PT SHORT TERM GOAL #5   Title She will report pain decreased 25% or more overall   Time 4   Period Weeks   Status Achieved           PT Long Term Goals - 01/26/16 1100      PT LONG TERM GOAL #1   Title She will be independent with all HEP issued   Status On-going     PT LONG TERM GOAL #2   Title She will report pain as intermittant without pain meds    Status Achieved     PT LONG TERM GOAL #3   Title She will walk in home with no device and in community with [facility] safely   Status Achieved     PT LONG TERM GOAL #4   Title She will report doing all normal home tasks with 1-2/10 max pain and no device   Status Achieved     PT LONG TERM GOAL  #5   Title She will return to normal community activty   Status Partially Met     Additional Long Term Goals   Additional Long Term Goals Yes     PT LONG TERM GOAL #6   Title walk up and down stairs step ove rstep  with cane and rail safely   Time 3   Period Weeks   Status New               Plan - 01/26/16 1023    Clinical Impression Statement She did well with all exercises though she had discomort on sloping walkway She had fallen on steps before and now is somewhat scared. No pain post session   PT Treatment/Interventions Cryotherapy;Associate Professor;Therapeutic exercise;Manual techniques;Taping;Passive range of motion;Patient/family education   PT Next Visit Plan Cont hip and knee ROM , strength and balance activity   Consulted and Agree with Plan of Care Patient      Patient will benefit from skilled therapeutic intervention in order to improve the following deficits and impairments:  Decreased range of motion, Difficulty walking, Pain, Decreased activity tolerance, Increased edema, Decreased strength, Increased muscle spasms  Visit Diagnosis: Difficulty in walking, not elsewhere classified  Muscle weakness (generalized)  Stiffness of right knee, not elsewhere classified     Problem List Patient Active Problem List   Diagnosis Date Noted  . Status post total right knee replacement 10/06/2015  . RBBB 09/06/2015  . Preoperative cardiovascular examination 09/06/2015  . Mild cognitive impairment with memory loss 09/04/2015  . Loss of weight 09/04/2015  . Mild cognitive impairment 07/15/2014  . Primary osteoarthritis of right knee 07/15/2014  . Xerosis cutis 07/15/2014  . Superficial burn of groin 11/24/2013  . B12 deficiency 09/23/2013  . Essential hypertension, benign 09/23/2013  . PVC's (premature ventricular contractions) 09/23/2013  . Vitamin D deficiency 09/23/2013  . Hyperglycemia 09/23/2013  . History of TIAs  08/06/2013  . DOE (dyspnea on exertion) 08/06/2013    [name]  PT 01/26/2016, 11:02 AM  [name]  [name], [name], [address] Phone: [phone]   Fax:  [phone]  Name: Mia [name] MRN: [id number] Date of Birth: Sep 21, 1925

## 2016-01-29 ENCOUNTER — Encounter: Payer: Self-pay | Admitting: Nurse Practitioner

## 2016-01-29 ENCOUNTER — Ambulatory Visit (INDEPENDENT_AMBULATORY_CARE_PROVIDER_SITE_OTHER): Payer: Medicare Other | Admitting: Nurse Practitioner

## 2016-01-29 VITALS — BP 118/72 | HR 63 | Temp 97.8°F | Resp 17 | Ht 67.0 in | Wt 120.6 lb

## 2016-01-29 DIAGNOSIS — M25522 Pain in left elbow: Secondary | ICD-10-CM

## 2016-01-29 DIAGNOSIS — I10 Essential (primary) hypertension: Secondary | ICD-10-CM | POA: Diagnosis not present

## 2016-01-29 DIAGNOSIS — M6289 Other specified disorders of muscle: Secondary | ICD-10-CM

## 2016-01-29 DIAGNOSIS — R600 Localized edema: Secondary | ICD-10-CM

## 2016-01-29 DIAGNOSIS — M109 Gout, unspecified: Secondary | ICD-10-CM

## 2016-01-29 DIAGNOSIS — R531 Weakness: Secondary | ICD-10-CM

## 2016-01-29 NOTE — Patient Instructions (Signed)
STOP NORVASC (amlodipine)

## 2016-01-29 NOTE — Progress Notes (Signed)
Careteam: Patient Care Team: Gayland Curry, DO as PCP - General (Geriatric Medicine) Monna Fam, MD as Consulting Physician (Ophthalmology) Pixie Casino, MD as Consulting Physician (Cardiology) [name], MD as Consulting Physician (Orthopedic Surgery)  Advanced Directive information Does patient have an advance directive?: Yes, Type of Advance Directive: [place];Living will;Out of facility DNR (pink MOST or yellow form)  No Known Allergies  Chief Complaint  Patient presents with  . Medical Management of Chronic Issues    4 week follow up. Labs printed     HPI: Patient is a 82 y.o. female seen in the office today for follow up on multiple issues.  At last visit pt had weakness and edema to left arm and leg even after restarting HCTZ  CT was negative for acute changes.  norvasc was also stopped due to fluid retention however she did not stop medication.  Still having swelling to bilateral LE worse to left leg. Left arm swelling has improved. Swelling to legs is persistent, even there in the morning when she wakes up. Does not cause her any discomfort or trouble.  No pain noted, no redness or heat Did start allopurinol- uric acid level has improved.  Taking 12.5 mg of HCTZ and potassium 10 meq daily    Review of Systems:  Review of Systems  Constitutional: Negative for chills and fever.       Reports good appetite today  HENT: Negative for congestion.   Eyes:       Glasses  Respiratory: Negative for shortness of breath (with exertion).   Cardiovascular: Positive for leg swelling. Negative for chest pain.  Gastrointestinal: Negative for abdominal pain, constipation and diarrhea.  Genitourinary: Negative for dysuria.  Musculoskeletal: Negative for joint swelling.  Skin: Negative for rash.  Neurological: Positive for weakness (to left side). Negative for dizziness.    Past Medical History:  Diagnosis Date  . Arthritis    oa    . Chronic kidney disease    chronic kidney diseaase follow by primary md  . Diabetes mellitus without complication ([place])    Diet controlled  . Former smoker   . H/O ventricular fibrillation 10 yrs ago dx  . Hypertension   . Prediabetes   . Seasonal allergies   . TIA (transient ischemic attack) none recent   Past Surgical History:  Procedure Laterality Date  . NM MYOCAR PERF WALL MOTION  01/2008   lexiscan myoview - normal pattern of perfusion in all regions, EF 92%, no significant ischemia demonstated  . TONSILLECTOMY  age 21   and adenoids  . TOTAL KNEE ARTHROPLASTY Right 10/06/2015   Procedure: RIGHT TOTAL KNEE ARTHROPLASTY;  Surgeon: [name], MD;  Location: [facility];  Service: Orthopedics;  Laterality: Right;  . TRANSTHORACIC ECHOCARDIOGRAM  12/2005   mild DUST, EF=>55%; mild mitral annular calcif; mild-mod aortic root calcif   Social History:   reports that she quit smoking about 15 years ago. Her smoking use included Cigarettes. She has a 10.00 pack-year smoking history. She has never used smokeless tobacco. She reports that she does not drink alcohol or use drugs.  Family History  Problem Relation Age of Onset  . Congestive Heart Failure Mother   . Lung cancer Brother   . Prostate cancer Son   . Bipolar disorder Son   . Heart disease Son     Medications: Patient's Medications  New Prescriptions   No medications on file  Previous Medications   ALLOPURINOL (  ZYLOPRIM) 100 MG TABLET    Take 1 tablet (100 mg total) by mouth daily.   AMLODIPINE (NORVASC) 5 MG TABLET    TAKE 1 TABLET BY MOUTH ONCE DAILY   CHOLECALCIFEROL (VITAMIN D) 1000 UNITS TABLET    Take 1,000 Units by mouth daily. D3   CLOPIDOGREL (PLAVIX) 75 MG TABLET    TAKE 1 TABLET BY MOUTH EVERY DAY   FOLIC ACID-VIT N3-ZJQ B34 (FOLBEE) 2.5-25-1 MG TABS TABLET    Take 1 tablet by mouth daily.   HYDROCHLOROTHIAZIDE (HYDRODIURIL) 12.5 MG TABLET    Take 1 tablet (12.5 mg total) by mouth daily.   KETOROLAC  (ACULAR) 0.5 % OPHTHALMIC SOLUTION    Place 1 drop into the right eye 2 (two) times daily.   METOPROLOL SUCCINATE (TOPROL-XL) 50 MG 24 HR TABLET    TAKE 1 TABLET BY MOUTH ONCE DAILY   POTASSIUM CHLORIDE SA (K-DUR,KLOR-CON) 10 MEQ TABLET    Take 1 tablet (10 mEq total) by mouth daily.  Modified Medications   No medications on file  Discontinued Medications   No medications on file     Physical Exam:  Vitals:   01/29/16 1425  BP: 118/72  Pulse: 63  Resp: 17  Temp: 97.8 F (36.6 C)  TempSrc: Oral  Weight: 120 lb 9.6 oz (54.7 kg)  Height: 5\' 7"  (1.702 m)   Body mass index is 18.89 kg/m.  Physical Exam  Constitutional: She is oriented to person, place, and time. No distress.  Thin tall black female  Cardiovascular: Normal rate and normal heart sounds.   Occasional irreg beat  Pulmonary/Chest: Effort normal and breath sounds normal. No respiratory distress.  Abdominal: Soft. Bowel sounds are normal.  Musculoskeletal: She exhibits edema (1+ edema bilateral LE).  Slow gait  Neurological: She is alert and oriented to person, place, and time.  Weakness to left arm   Skin: Skin is warm and dry.  Psychiatric: She has a normal mood and affect.   Labs reviewed: Basic Metabolic Panel:  Recent Labs  09/04/15 0901  11/24/15 1121 01/04/16 0905 01/25/16 0824  NA 142  < > 140 141 144  K 3.8  < > 3.6 3.1* 3.8  CL 98  < > 103 102 106  CO2 26  < > 24 26 28   GLUCOSE 102*  < > 101* 106* 98  BUN 36*  < > 37* 28* 23  CREATININE 1.96*  < > 1.77* 1.52* 1.32*  CALCIUM 10.4*  < > 9.9 9.4 9.8  TSH 2.770  --   --   --   --   < > = values in this interval not displayed. Liver Function Tests:  Recent Labs  02/27/15 1150 05/08/15 0910 09/04/15 0901 10/12/15  AST 21 22 23 25   ALT 13 13 12 23   ALKPHOS 56 55 51 59  BILITOT 0.4 0.4 0.6  --   PROT 6.7 6.1 6.5  --   ALBUMIN 4.3 4.1 4.3  --    No results for input(s): LIPASE, AMYLASE in the last 8760 hours. No results for input(s):  AMMONIA in the last 8760 hours. CBC:  Recent Labs  09/04/15 0901  09/29/15 0930 10/07/15 0410 10/12/15 11/03/15 1334  WBC 5.6  < > 7.2 9.7 8.3 6.9  NEUTROABS 4.4  --   --   --  7 5.2  HGB  --   --  14.0 11.9* 10.0*  --   HCT 44.2  < > 40.9 36.0 31* 34.6  MCV  94  --  90.5 92.1  --  92  PLT 217  < > 215 239 327 293  < > = values in this interval not displayed. Lipid Panel:  Recent Labs  05/08/15 0910 09/04/15 0901  CHOL 156 151  HDL 78 85  LDLCALC 59 53  TRIG 97 66  CHOLHDL 2.0 1.8   TSH:  Recent Labs  09/04/15 0901  TSH 2.770   A1C: Lab Results  Component Value Date   HGBA1C 6.5 (H) 09/04/2015     Assessment/Plan 1. Essential hypertension Stable, will stop norvasc due to LE edema, pt to cont on toprol XL and hctz  2. Lower leg edema To stop Norvasc at this time as this could be contributing to LE edema. Low sodium diet Elevate legs when sitting as tolerates  3. Left-sided weakness CT negative, arm still weak but is improving, participating in PT at this time  4. Left elbow pain Improving, pain most likely due to gout flare  5. Gout of multiple sites, unspecified cause, unspecified chronicity Improving pain since flare and uric acid level is down   Derrien Anschutz K. Harle Battiest  [facility] [phone] 8 am - 5 pm) [phone] (after hours)

## 2016-01-31 ENCOUNTER — Ambulatory Visit: Payer: Medicare Other

## 2016-01-31 DIAGNOSIS — R262 Difficulty in walking, not elsewhere classified: Secondary | ICD-10-CM | POA: Diagnosis not present

## 2016-01-31 DIAGNOSIS — M6281 Muscle weakness (generalized): Secondary | ICD-10-CM

## 2016-01-31 DIAGNOSIS — M25561 Pain in right knee: Secondary | ICD-10-CM

## 2016-01-31 DIAGNOSIS — M25661 Stiffness of right knee, not elsewhere classified: Secondary | ICD-10-CM

## 2016-01-31 NOTE — Therapy (Signed)
[place], [name], [address] Phone: [phone]   Fax:  [phone]  Physical Therapy Treatment  Patient Details  Name: Mia [name] MRN: [id number] Date of Birth: June 23, 1925 Referring Provider: Jean Rosenthal, MD  Encounter Date: 01/31/2016      PT End of Session - 01/31/16 1018    Visit Number 17   Number of Visits 24   Date for PT Re-Evaluation 02/16/16   Authorization Type UHC Medicare   PT Start Time 1016   PT Stop Time 1100   PT Time Calculation (min) 44 min   Activity Tolerance Patient tolerated treatment well   Behavior During Therapy [facility] for tasks assessed/performed      Past Medical History:  Diagnosis Date  . Arthritis    oa  . Chronic kidney disease    chronic kidney diseaase follow by primary md  . Diabetes mellitus without complication ([place])    Diet controlled  . Former smoker   . H/O ventricular fibrillation 10 yrs ago dx  . Hypertension   . Prediabetes   . Seasonal allergies   . TIA (transient ischemic attack) none recent    Past Surgical History:  Procedure Laterality Date  . NM MYOCAR PERF WALL MOTION  01/2008   lexiscan myoview - normal pattern of perfusion in all regions, EF 92%, no significant ischemia demonstated  . TONSILLECTOMY  age 82   and adenoids  . TOTAL KNEE ARTHROPLASTY Right 10/06/2015   Procedure: RIGHT TOTAL KNEE ARTHROPLASTY;  Surgeon: [name], MD;  Location: [facility];  Service: Orthopedics;  Laterality: Right;  . TRANSTHORACIC ECHOCARDIOGRAM  12/2005   mild DUST, EF=>55%; mild mitral annular calcif; mild-mod aortic root calcif    There were no vitals filed for this visit.                       [place] Adult PT Treatment/Exercise - 01/31/16 0001      Ambulation/Gait   Stairs Yes   Stairs Assistance 6: Modified independent (Device/Increase time)   Stair Management Technique Alternating pattern   Number of Stairs 24    Height of Stairs 6   Gait Comments Walking with CG and belt with cues to incr speed , also walked outside to sttep walkway forward and back up and down walkway . Some LOB but caught immediatly by PT  so no risk of fall.      Knee/Hip Exercises: Seated   Long Arc Quad Right  30 reps   Long Arc Quad Weight 5 lbs.   Long [employer] Limitations then 5 reps x 10 sec   Sit to [employer] 10 reps     Knee/Hip Exercises: Sidelying   Hip ABduction Right;Left  12   Clams 15 reps RT and LT     Moist Heat Therapy   Number Minutes Moist Heat 15 Minutes   Moist Heat Location Knee      Nustep L5 8 min           PT Education - 01/31/16 1058    Education provided Yes   Education Details She was advised to always do stairs in the manner she feels safest. to decr risk of fall   Person(s) Educated Patient   Methods Explanation   Comprehension Verbalized understanding          PT Short Term Goals - 01/31/16 1101      PT SHORT TERM GOAL #1   Title She  will be independent with intal HEP   Status Achieved     PT SHORT TERM GOAL #2   Title She will have active flexion to 115 degrees   Status Unable to assess     PT SHORT TERM GOAL #3   Title She will be able to extend knee actively to -10 degrees for improved stability with walking   Baseline -12   Status On-going     PT SHORT TERM GOAL #4   Title She will walk safely in home wih SPC   Status Achieved     PT SHORT TERM GOAL #5   Title She will report pain decreased 25% or more overall   Status Achieved           PT Long Term Goals - 01/31/16 1101      PT LONG TERM GOAL #1   Title She will be independent with all HEP issued   Status On-going     PT LONG TERM GOAL #2   Title She will report pain as intermittant without pain meds    Status Achieved     PT LONG TERM GOAL #3   Title She will walk in home with no device and in community with [facility] safely   Status Achieved     PT LONG TERM GOAL #4   Title She will report  doing all normal home tasks with 1-2/10 max pain and no device   Status Achieved     PT LONG TERM GOAL #5   Title She will return to normal community activty   Status Partially Met     PT LONG TERM GOAL #6   Title walk up and down stairs step ove rstep  with cane and rail safely   Status Partially Met               Plan - 01/31/16 1022    Clinical Impression Statement She was having some coccyx pain in supine so did not do manual . Otherwise she did all exercise correctly She was sor in hip post sidelye exercise so advised to ice/heat of medicate as needed but should only last a day or 2.    PT Treatment/Interventions Cryotherapy;Associate Professor;Therapeutic exercise;Manual techniques;Taping;Passive range of motion;Patient/family education   PT Next Visit Plan Cont hip and knee ROM , strength and balance activity   Consulted and Agree with Plan of Care Patient      Patient will benefit from skilled therapeutic intervention in order to improve the following deficits and impairments:  Decreased range of motion, Difficulty walking, Pain, Decreased activity tolerance, Increased edema, Decreased strength, Increased muscle spasms  Visit Diagnosis: Difficulty in walking, not elsewhere classified  Muscle weakness (generalized)  Stiffness of right knee, not elsewhere classified  Pain in right knee     Problem List Patient Active Problem List   Diagnosis Date Noted  . Status post total right knee replacement 10/06/2015  . RBBB 09/06/2015  . Preoperative cardiovascular examination 09/06/2015  . Mild cognitive impairment with memory loss 09/04/2015  . Loss of weight 09/04/2015  . Mild cognitive impairment 07/15/2014  . Primary osteoarthritis of right knee 07/15/2014  . Xerosis cutis 07/15/2014  . Superficial burn of groin 11/24/2013  . B12 deficiency 09/23/2013  . Essential hypertension, benign 09/23/2013  . PVC's (premature ventricular  contractions) 09/23/2013  . Vitamin D deficiency 09/23/2013  . Hyperglycemia 09/23/2013  . History of TIAs 08/06/2013  . DOE (dyspnea on exertion) 08/06/2013  [name] PT 01/31/2016, 11:03 AM  [facility] [address] [place], [name], [address] Phone: [phone]   Fax:  [phone]  Name: Matisha Termine MRN: [id number] Date of Birth: [birth year]

## 2016-02-02 ENCOUNTER — Ambulatory Visit: Payer: Medicare Other

## 2016-02-02 DIAGNOSIS — M25661 Stiffness of right knee, not elsewhere classified: Secondary | ICD-10-CM

## 2016-02-02 DIAGNOSIS — M6281 Muscle weakness (generalized): Secondary | ICD-10-CM

## 2016-02-02 DIAGNOSIS — R262 Difficulty in walking, not elsewhere classified: Secondary | ICD-10-CM

## 2016-02-02 DIAGNOSIS — M25561 Pain in right knee: Secondary | ICD-10-CM

## 2016-02-02 NOTE — Therapy (Signed)
[place], [name], [address] Phone: [phone]   Fax:  [phone]  Physical Therapy Treatment  Patient Details  Name: Mia [name] MRN: [id number] Date of Birth: 11-Jun-1925 Referring Provider: Jean Rosenthal, MD  Encounter Date: 02/02/2016      PT End of Session - 02/02/16 1113    Visit Number 18   Number of Visits 24   Date for PT Re-Evaluation 02/16/16   Authorization Type UHC Medicare   Authorization Time Period KX at visit 15   PT Start Time 1112   PT Stop Time 1152   PT Time Calculation (min) 40 min   Activity Tolerance Patient tolerated treatment well   Behavior During Therapy [facility] for tasks assessed/performed      Past Medical History:  Diagnosis Date  . Arthritis    oa  . Chronic kidney disease    chronic kidney diseaase follow by primary md  . Diabetes mellitus without complication ([place])    Diet controlled  . Former smoker   . H/O ventricular fibrillation 10 yrs ago dx  . Hypertension   . Prediabetes   . Seasonal allergies   . TIA (transient ischemic attack) none recent    Past Surgical History:  Procedure Laterality Date  . NM MYOCAR PERF WALL MOTION  01/2008   lexiscan myoview - normal pattern of perfusion in all regions, EF 92%, no significant ischemia demonstated  . TONSILLECTOMY  age 51   and adenoids  . TOTAL KNEE ARTHROPLASTY Right 10/06/2015   Procedure: RIGHT TOTAL KNEE ARTHROPLASTY;  Surgeon: [name], MD;  Location: [facility];  Service: Orthopedics;  Laterality: Right;  . TRANSTHORACIC ECHOCARDIOGRAM  12/2005   mild DUST, EF=>55%; mild mitral annular calcif; mild-mod aortic root calcif    There were no vitals filed for this visit.      Subjective Assessment - 02/02/16 1122    Subjective no pain  still needs SPC as does not feel balanced without.    Currently in Pain? No/denies  pain with stretching            [facility] PT Assessment - 02/02/16  1128      AROM   Right Knee Extension -20   Right Knee Flexion 115     PROM   Right Knee Extension -18   Right Knee Flexion 118  painful                     [facility] Adult PT Treatment/Exercise - 02/02/16 1114      Ambulation/Gait   Assistive device Straight cane   Ambulation Surface Level;Indoor   Gait velocity .31 M/sec     Knee/Hip Exercises: Aerobic   Nustep L5 x 5 min     Knee/Hip Exercises: Standing   Forward Step Up Right;Hand Hold: 1;Step Height: 8"   Forward Step Up Limitations x20 in set of 5 RT and LT   Wall Squat 15 reps     Knee/Hip Exercises: Seated   Long Arc Quad Right;3 sets;10 reps   Long Arc Quad Weight 10 lbs.   Long [employer] Limitations then 15 reps with 5 pounds   Other Seated Knee/Hip Exercises step to 10 inch step with +1 Light CG 2 x10 RT and Lt . Pt nervous but was able top do well.      Knee/Hip Exercises: Sidelying   Hip ABduction Left;15 reps     sit to stand x 15 reps ,  PT Short Term Goals - 01/31/16 1101      PT SHORT TERM GOAL #1   Title She will be independent with intal HEP   Status Achieved     PT SHORT TERM GOAL #2   Title She will have active flexion to 115 degrees   Status Unable to assess     PT SHORT TERM GOAL #3   Title She will be able to extend knee actively to -10 degrees for improved stability with walking   Baseline -12   Status On-going     PT SHORT TERM GOAL #4   Title She will walk safely in home wih SPC   Status Achieved     PT SHORT TERM GOAL #5   Title She will report pain decreased 25% or more overall   Status Achieved           PT Long Term Goals - 01/31/16 1101      PT LONG TERM GOAL #1   Title She will be independent with all HEP issued   Status On-going     PT LONG TERM GOAL #2   Title She will report pain as intermittant without pain meds    Status Achieved     PT LONG TERM GOAL #3   Title She will walk in home with no device and in community with [facility]  safely   Status Achieved     PT LONG TERM GOAL #4   Title She will report doing all normal home tasks with 1-2/10 max pain and no device   Status Achieved     PT LONG TERM GOAL #5   Title She will return to normal community activty   Status Partially Met     PT LONG TERM GOAL #6   Title walk up and down stairs step ove rstep  with cane and rail safely   Status Partially Met               Plan - 02/02/16 1113    Clinical Impression Statement Reports of fatigue and fear of falling . She was able to do activity well but was obviously scared of a fall having fallen in past.  ROM flex incr extenion decr. Will complete last 4 visits then discharge   PT Frequency 2x / week   PT Duration 4 weeks   PT Treatment/Interventions Cryotherapy;Associate Professor;Therapeutic exercise;Manual techniques;Taping;Passive range of motion;Patient/family education   PT Next Visit Plan Cont hip and knee ROM , strength and balance activity   Consulted and Agree with Plan of Care Patient      Patient will benefit from skilled therapeutic intervention in order to improve the following deficits and impairments:  Decreased range of motion, Difficulty walking, Pain, Decreased activity tolerance, Increased edema, Decreased strength, Increased muscle spasms  Visit Diagnosis: Difficulty in walking, not elsewhere classified  Muscle weakness (generalized)  Stiffness of right knee, not elsewhere classified  Pain in right knee     Problem List Patient Active Problem List   Diagnosis Date Noted  . Status post total right knee replacement 10/06/2015  . RBBB 09/06/2015  . Preoperative cardiovascular examination 09/06/2015  . Mild cognitive impairment with memory loss 09/04/2015  . Loss of weight 09/04/2015  . Mild cognitive impairment 07/15/2014  . Primary osteoarthritis of right knee 07/15/2014  . Xerosis cutis 07/15/2014  . Superficial burn of groin 11/24/2013  .  B12 deficiency 09/23/2013  . Essential hypertension, benign 09/23/2013  . PVC's (premature ventricular  contractions) 09/23/2013  . Vitamin D deficiency 09/23/2013  . Hyperglycemia 09/23/2013  . History of TIAs 08/06/2013  . DOE (dyspnea on exertion) 08/06/2013    [name]  PT 02/02/2016, 12:02 PM  [facility] [address] [place], [name], [address] Phone: [phone]   Fax:  [phone]  Name: Carlye Panameno MRN: [id number] Date of Birth: May 20, 1926

## 2016-02-05 ENCOUNTER — Ambulatory Visit: Payer: Medicare Other | Admitting: Nurse Practitioner

## 2016-02-06 ENCOUNTER — Other Ambulatory Visit: Payer: Self-pay | Admitting: Nurse Practitioner

## 2016-02-06 DIAGNOSIS — I10 Essential (primary) hypertension: Secondary | ICD-10-CM

## 2016-02-07 ENCOUNTER — Ambulatory Visit: Payer: Medicare Other

## 2016-02-07 DIAGNOSIS — R262 Difficulty in walking, not elsewhere classified: Secondary | ICD-10-CM | POA: Diagnosis not present

## 2016-02-07 DIAGNOSIS — M25561 Pain in right knee: Secondary | ICD-10-CM

## 2016-02-07 DIAGNOSIS — R2681 Unsteadiness on feet: Secondary | ICD-10-CM

## 2016-02-07 DIAGNOSIS — M6281 Muscle weakness (generalized): Secondary | ICD-10-CM

## 2016-02-07 DIAGNOSIS — M25661 Stiffness of right knee, not elsewhere classified: Secondary | ICD-10-CM

## 2016-02-07 NOTE — Therapy (Signed)
[name], [name], [address] Phone: [phone]   Fax:  [phone]  Physical Therapy Treatment  Patient Details  Name: Mia [name] MRN: [id number] Date of Birth: Feb 23, 1926 Referring Provider: Jean Rosenthal, MD  Encounter Date: 02/07/2016      PT End of Session - 02/07/16 1109    Visit Number 19   Number of Visits 24   Date for PT Re-Evaluation 02/16/16   Authorization Type UHC Medicare   Authorization Time Period KX at visit 15   PT Start Time 1105   PT Stop Time 1144   PT Time Calculation (min) 39 min   Activity Tolerance Patient tolerated treatment well;Patient limited by pain  Lt heel pain   Behavior During Therapy [facility] for tasks assessed/performed      Past Medical History:  Diagnosis Date  . Arthritis    oa  . Chronic kidney disease    chronic kidney diseaase follow by primary md  . Diabetes mellitus without complication ([place])    Diet controlled  . Former smoker   . H/O ventricular fibrillation 10 yrs ago dx  . Hypertension   . Prediabetes   . Seasonal allergies   . TIA (transient ischemic attack) none recent    Past Surgical History:  Procedure Laterality Date  . NM MYOCAR PERF WALL MOTION  01/2008   lexiscan myoview - normal pattern of perfusion in all regions, EF 92%, no significant ischemia demonstated  . TONSILLECTOMY  age 58   and adenoids  . TOTAL KNEE ARTHROPLASTY Right 10/06/2015   Procedure: RIGHT TOTAL KNEE ARTHROPLASTY;  Surgeon: [name], MD;  Location: [facility];  Service: Orthopedics;  Laterality: Right;  . TRANSTHORACIC ECHOCARDIOGRAM  12/2005   mild DUST, EF=>55%; mild mitral annular calcif; mild-mod aortic root calcif    There were no vitals filed for this visit.      Subjective Assessment - 02/07/16 1112    Subjective Can't walk today as LT heel is in pain.    Currently in Pain? No/denies  she has mild to moderate Lt heel pain with wlaking  not at rest.                          [facility] Adult PT Treatment/Exercise - 02/07/16 0001      Knee/Hip Exercises: Aerobic   Nustep L4 LE 11 min     Knee/Hip Exercises: Standing   Other Standing Knee Exercises marching x2 each ;lleg x 12 reps  for balance and stability     Knee/Hip Exercises: Seated   Long Arc Quad Strengthening;Right;15 reps   Long Arc Quad Weight 5 lbs.   Long [employer] Limitations then 15 reps with 10 pounds   Sit to [employer] 15 reps  with legs at 90/90     Manual Therapy   Passive ROM extension overpressure and gentle flexion at end range 30 sec x 5 and with extension QS and gentle overpressure due to pain.                   PT Short Term Goals - 01/31/16 1101      PT SHORT TERM GOAL #1   Title She will be independent with intal HEP   Status Achieved     PT SHORT TERM GOAL #2   Title She will have active flexion to 115 degrees   Status Unable to assess     PT SHORT TERM  GOAL #3   Title She will be able to extend knee actively to -10 degrees for improved stability with walking   Baseline -12   Status On-going     PT SHORT TERM GOAL #4   Title She will walk safely in home wih SPC   Status Achieved     PT SHORT TERM GOAL #5   Title She will report pain decreased 25% or more overall   Status Achieved           PT Long Term Goals - 01/31/16 1101      PT LONG TERM GOAL #1   Title She will be independent with all HEP issued   Status On-going     PT LONG TERM GOAL #2   Title She will report pain as intermittant without pain meds    Status Achieved     PT LONG TERM GOAL #3   Title She will walk in home with no device and in community with [facility] safely   Status Achieved     PT LONG TERM GOAL #4   Title She will report doing all normal home tasks with 1-2/10 max pain and no device   Status Achieved     PT LONG TERM GOAL #5   Title She will return to normal community activty   Status Partially Met     PT LONG TERM  GOAL #6   Title walk up and down stairs step ove rstep  with cane and rail safely   Status Partially Met               Plan - 01-Mar-2016 1109    Clinical Impression Statement Lt heel pain limited activity on feet today .  Will continue as pain in heel subsides. tylenol eases pain. FOTO score not changed since last month . Will discharge per plan   PT Treatment/Interventions Cryotherapy;Associate Professor;Therapeutic exercise;Manual techniques;Taping;Passive range of motion;Patient/family education   PT Next Visit Plan Cont hip and knee ROM , strength and balance activity   Consulted and Agree with Plan of Care Patient      Patient will benefit from skilled therapeutic intervention in order to improve the following deficits and impairments:  Decreased range of motion, Difficulty walking, Pain, Decreased activity tolerance, Increased edema, Decreased strength, Increased muscle spasms  Visit Diagnosis: Difficulty in walking, not elsewhere classified  Muscle weakness (generalized)  Stiffness of right knee, not elsewhere classified  Pain in right knee  Unsteadiness on feet       G-Codes - Mar 01, 2016 1150    Functional Assessment Tool Used FOTO 42% limited   Functional Limitation Mobility: Walking and moving around   Mobility: Walking and Moving Around Current Status [phone]) At least 40 percent but less than 60 percent impaired, limited or restricted   Mobility: Walking and Moving Around Goal Status [phone]) At least 20 percent but less than 40 percent impaired, limited or restricted      Problem List Patient Active Problem List   Diagnosis Date Noted  . Status post total right knee replacement 10/06/2015  . RBBB 09/06/2015  . Preoperative cardiovascular examination 09/06/2015  . Mild cognitive impairment with memory loss 09/04/2015  . Loss of weight 09/04/2015  . Mild cognitive impairment 07/15/2014  . Primary osteoarthritis of right knee  07/15/2014  . Xerosis cutis 07/15/2014  . Superficial burn of groin 11/24/2013  . B12 deficiency 09/23/2013  . Essential hypertension, benign 09/23/2013  . PVC's (premature ventricular contractions) 09/23/2013  .  Vitamin D deficiency 09/23/2013  . Hyperglycemia 09/23/2013  . History of TIAs 08/06/2013  . DOE (dyspnea on exertion) 08/06/2013    [name]  PT 02/07/2016, 11:55 AM  [facility] [address] [place], [name], [address] Phone: [phone]   Fax:  [phone]  Name: Yaa Donnellan MRN: [id number] Date of Birth: December 20, 1925

## 2016-02-09 ENCOUNTER — Ambulatory Visit: Payer: Medicare Other

## 2016-02-14 ENCOUNTER — Ambulatory Visit: Payer: Medicare Other

## 2016-02-14 DIAGNOSIS — M6281 Muscle weakness (generalized): Secondary | ICD-10-CM

## 2016-02-14 DIAGNOSIS — M25661 Stiffness of right knee, not elsewhere classified: Secondary | ICD-10-CM

## 2016-02-14 DIAGNOSIS — M25561 Pain in right knee: Secondary | ICD-10-CM

## 2016-02-14 DIAGNOSIS — R262 Difficulty in walking, not elsewhere classified: Secondary | ICD-10-CM | POA: Diagnosis not present

## 2016-02-14 DIAGNOSIS — R2681 Unsteadiness on feet: Secondary | ICD-10-CM

## 2016-02-14 NOTE — Therapy (Signed)
[place], [name], [address] Phone: [phone]   Fax:  [phone]  Physical Therapy Treatment  Patient Details  Name: Mia [name] MRN: [id number] Date of Birth: April 17, 1926 Referring Provider: Jean Rosenthal, MD  Encounter Date: 02/14/2016      PT End of Session - 02/14/16 1158    Visit Number 20   Number of Visits 24   Date for PT Re-Evaluation 02/16/16   PT Start Time 1145   PT Stop Time 1230   PT Time Calculation (min) 45 min   Activity Tolerance Patient tolerated treatment well   Behavior During Therapy [facility] for tasks assessed/performed      Past Medical History:  Diagnosis Date  . Arthritis    oa  . Chronic kidney disease    chronic kidney diseaase follow by primary md  . Diabetes mellitus without complication ([place])    Diet controlled  . Former smoker   . H/O ventricular fibrillation 10 yrs ago dx  . Hypertension   . Prediabetes   . Seasonal allergies   . TIA (transient ischemic attack) none recent    Past Surgical History:  Procedure Laterality Date  . NM MYOCAR PERF WALL MOTION  01/2008   lexiscan myoview - normal pattern of perfusion in all regions, EF 92%, no significant ischemia demonstated  . TONSILLECTOMY  age 89   and adenoids  . TOTAL KNEE ARTHROPLASTY Right 10/06/2015   Procedure: RIGHT TOTAL KNEE ARTHROPLASTY;  Surgeon: [name], MD;  Location: [facility];  Service: Orthopedics;  Laterality: Right;  . TRANSTHORACIC ECHOCARDIOGRAM  12/2005   mild DUST, EF=>55%; mild mitral annular calcif; mild-mod aortic root calcif    There were no vitals filed for this visit.      Subjective Assessment - 02/14/16 1157    Subjective Heel pain better . This is last day I think.   Currently in Pain? No/denies  in knee            [facility] PT Assessment - 02/14/16 0001      Circumferential Edema   Circumferential - Right 35 cm     AROM   Right Knee Extension -20    Right Knee Flexion 115     PROM   Right Knee Extension -17  significant end range pain   Right Knee Flexion 118  significant end range pain     Strength   Right Knee Flexion --  5-/5   Right Knee Extension 5/5     Ambulation/Gait   Assistive device None   Gait Pattern Step-through pattern   Ambulation Surface Level;Indoor   Gait velocity .45 M/sec                     [facility] Adult PT Treatment/Exercise - 02/14/16 0001      Knee/Hip Exercises: Aerobic   Nustep L4 LE 8 min     Knee/Hip Exercises: Standing   Forward Step Up Right;Hand Hold: 1;Step Height: 8"   Forward Step Up Limitations x20 in set of 5 RT and LT   Other Standing Knee Exercises marching x2 each ;lleg x 12 reps  for balance and stability     Knee/Hip Exercises: Seated   Other Seated Knee/Hip Exercises step to 10 inch step with +1 Light CG 2 x10 RT and Lt . Pt nervous but was able top do well.  PT Short Term Goals - 02/14/16 1159      PT SHORT TERM GOAL #1   Title She will be independent with intal HEP   Status Achieved     PT SHORT TERM GOAL #2   Title She will have active flexion to 115 degrees     PT SHORT TERM GOAL #3   Title She will be able to extend knee actively to -10 degrees for improved stability with walking   Baseline -16   Status Partially Met     PT SHORT TERM GOAL #4   Title She will walk safely in home wih SPC   Status Achieved     PT SHORT TERM GOAL #5   Title She will report pain decreased 25% or more overall   Status Achieved           PT Long Term Goals - 02/14/16 1201      PT LONG TERM GOAL #1   Title She will be independent with all HEP issued   Status Achieved     PT LONG TERM GOAL #2   Title She will report pain as intermittant without pain meds    Status Achieved     PT LONG TERM GOAL #3   Title She will walk in home with no device and in community with [facility] safely   Status Achieved     PT LONG TERM GOAL #4    Title She will report doing all normal home tasks with 1-2/10 max pain and no device   Status Achieved     PT LONG TERM GOAL #5   Title She will return to normal community activty   Status Achieved     PT LONG TERM GOAL #6   Title walk up and down stairs step over step  with cane and rail safely   Baseline Knee flex limit (115 degrees) makes descent less smooth   Status Partially Met               Plan - 02/14/16 1640    Clinical Impression Statement Pain imroved with no heel or back pain. She will be ready for discharge next visit. Will do final assessment and review HEP .    PT Treatment/Interventions Cryotherapy;Associate Professor;Therapeutic exercise;Manual techniques;Taping;Passive range of motion;Patient/family education   PT Next Visit Plan Discharge with HEP 02/16/16   Consulted and Agree with Plan of Care Patient      Patient will benefit from skilled therapeutic intervention in order to improve the following deficits and impairments:  Decreased range of motion, Difficulty walking, Pain, Decreased activity tolerance, Increased edema, Decreased strength, Increased muscle spasms  Visit Diagnosis: Difficulty in walking, not elsewhere classified  Muscle weakness (generalized)  Stiffness of right knee, not elsewhere classified  Pain in right knee  Unsteadiness on feet     Problem List Patient Active Problem List   Diagnosis Date Noted  . Status post total right knee replacement 10/06/2015  . RBBB 09/06/2015  . Preoperative cardiovascular examination 09/06/2015  . Mild cognitive impairment with memory loss 09/04/2015  . Loss of weight 09/04/2015  . Mild cognitive impairment 07/15/2014  . Primary osteoarthritis of right knee 07/15/2014  . Xerosis cutis 07/15/2014  . Superficial burn of groin 11/24/2013  . B12 deficiency 09/23/2013  . Essential hypertension, benign 09/23/2013  . PVC's (premature ventricular contractions)  09/23/2013  . Vitamin D deficiency 09/23/2013  . Hyperglycemia 09/23/2013  . History of TIAs 08/06/2013  . DOE (dyspnea on  exertion) 08/06/2013    [name] PT 02/14/2016, 4:41 PM  [facility] [address] [place], [name], [address] Phone: [phone]   Fax:  [phone]  Name: Zephyr Sausedo MRN: [id number] Date of Birth: Sep 27, 1925

## 2016-02-16 ENCOUNTER — Ambulatory Visit: Payer: Medicare Other

## 2016-02-16 DIAGNOSIS — R262 Difficulty in walking, not elsewhere classified: Secondary | ICD-10-CM

## 2016-02-16 DIAGNOSIS — M25561 Pain in right knee: Secondary | ICD-10-CM

## 2016-02-16 DIAGNOSIS — M6281 Muscle weakness (generalized): Secondary | ICD-10-CM

## 2016-02-16 DIAGNOSIS — M25661 Stiffness of right knee, not elsewhere classified: Secondary | ICD-10-CM

## 2016-02-16 NOTE — Therapy (Signed)
[place], [name], [address] Phone: [phone]   Fax:  [phone]  Physical Therapy Treatment  Patient Details  Name: Mia [name] MRN: [id number] Date of Birth: [birth year] Referring Provider: Jean Rosenthal, MD  Encounter Date: 02/16/2016      PT End of Session - 02/16/16 1202    Visit Number 21   Number of Visits 24   PT Start Time 1100   PT Stop Time 1145   PT Time Calculation (min) 45 min   Activity Tolerance Patient tolerated treatment well   Behavior During Therapy [facility] for tasks assessed/performed      Past Medical History:  Diagnosis Date  . Arthritis    oa  . Chronic kidney disease    chronic kidney diseaase follow by primary md  . Diabetes mellitus without complication ([place])    Diet controlled  . Former smoker   . H/O ventricular fibrillation 10 yrs ago dx  . Hypertension   . Prediabetes   . Seasonal allergies   . TIA (transient ischemic attack) none recent    Past Surgical History:  Procedure Laterality Date  . NM MYOCAR PERF WALL MOTION  01/2008   lexiscan myoview - normal pattern of perfusion in all regions, EF 92%, no significant ischemia demonstated  . TONSILLECTOMY  82   and adenoids  . TOTAL KNEE ARTHROPLASTY Right 10/06/2015   Procedure: RIGHT TOTAL KNEE ARTHROPLASTY;  Surgeon: [name], MD;  Location: [facility];  Service: Orthopedics;  Laterality: Right;  . TRANSTHORACIC ECHOCARDIOGRAM  12/2005   mild DUST, EF=>55%; mild mitral annular calcif; mild-mod aortic root calcif    There were no vitals filed for this visit.      Subjective Assessment - 02/16/16 1116    Subjective No pain   Currently in Pain? No/denies            [facility] PT Assessment - 02/16/16 0001      AROM   Right Knee Extension -20   Right Knee Flexion 115   Left Knee Extension 0   Left Knee Flexion 142     PROM   Right Knee Extension -17   Right Knee Flexion 118     Strength   Right Knee Extension 5/5   Left Knee Flexion 5/5                     [facility] Adult PT Treatment/Exercise - 02/16/16 0001      Knee/Hip Exercises: Aerobic   Nustep L5 7 min LE only     Reviewed all HEP with sit to stand , heel and toe lifts in standing, marching and sidesteps at counter, LAQ, sitting abduction, hamstring  With and without band and how to modify as needed and frequency and why continuing HEP at least every other day is so beneficial to long term improvement and function            PT Education - 02/16/16 1156    Education provided Yes   Education Details reviewed HEP and benefits of regular exercise long term   Person(s) Educated Patient   Methods Explanation;Verbal cues   Comprehension Verbalized understanding;Returned demonstration          PT Short Term Goals - 02/14/16 1159      PT SHORT TERM GOAL #1   Title She will be independent with intal HEP   Status Achieved     PT SHORT TERM GOAL #2  Title She will have active flexion to 115 degrees     PT SHORT TERM GOAL #3   Title She will be able to extend knee actively to -10 degrees for improved stability with walking   Baseline -16   Status Partially Met     PT SHORT TERM GOAL #4   Title She will walk safely in home wih SPC   Status Achieved     PT SHORT TERM GOAL #5   Title She will report pain decreased 25% or more overall   Status Achieved           PT Long Term Goals - 02/14/16 1201      PT LONG TERM GOAL #1   Title She will be independent with all HEP issued   Status Achieved     PT LONG TERM GOAL #2   Title She will report pain as intermittant without pain meds    Status Achieved     PT LONG TERM GOAL #3   Title She will walk in home with no device and in community with [facility] safely   Status Achieved     PT LONG TERM GOAL #4   Title She will report doing all normal home tasks with 1-2/10 max pain and no device   Status Achieved     PT LONG TERM GOAL  #5   Title She will return to normal community activty   Status Achieved     PT LONG TERM GOAL #6   Title walk up and down stairs step over step  with cane and rail safely   Baseline Knee flex limit (115 degrees) makes descent less smooth   Status Partially Met               Plan - March 10, 2016 1157    Clinical Impression Statement She is ready for discharge  She continues with decr ROM and end range pain limiting ability to stretch. She is steady but less do on stairs partly due to fear. She is able to do her HEP and needs to do this every day or other day.    PT Next Visit Plan Discharge with HEP 03-10-16   Consulted and Agree with Plan of Care Patient      Patient will benefit from skilled therapeutic intervention in order to improve the following deficits and impairments:  Decreased range of motion, Difficulty walking, Pain, Decreased activity tolerance, Increased edema, Decreased strength, Increased muscle spasms  Visit Diagnosis: Difficulty in walking, not elsewhere classified  Muscle weakness (generalized)  Stiffness of right knee, not elsewhere classified  Pain in right knee       G-Codes - 03/10/2016 1204    Functional Assessment Tool Used FOTO 42% limited   Functional Limitation Mobility: Walking and moving around   Mobility: Walking and Moving Around Goal Status [phone]) At least 20 percent but less than 40 percent impaired, limited or restricted   Mobility: Walking and Moving Around Discharge Status [id number]) At least 40 percent but less than 60 percent impaired, limited or restricted      Problem List Patient Active Problem List   Diagnosis Date Noted  . Status post total right knee replacement 10/06/2015  . RBBB 09/06/2015  . Preoperative cardiovascular examination 09/06/2015  . Mild cognitive impairment with memory loss 09/04/2015  . Loss of weight 09/04/2015  . Mild cognitive impairment 07/15/2014  . Primary osteoarthritis of right knee 07/15/2014  .  Xerosis cutis 07/15/2014  . Superficial burn of groin  11/24/2013  . B12 deficiency 09/23/2013  . Essential hypertension, benign 09/23/2013  . PVC's (premature ventricular contractions) 09/23/2013  . Vitamin D deficiency 09/23/2013  . Hyperglycemia 09/23/2013  . History of TIAs 08/06/2013  . DOE (dyspnea on exertion) 08/06/2013    [name] PT 02/16/2016, 12:04 PM  [facility] [address] [place], [name], [address] Phone: [phone]   Fax:  [phone]  Name: Mia [name] MRN: [id number] Date of Birth: 1925-09-17   PHYSICAL THERAPY DISCHARGE SUMMARY  Visits from Start of Care: 21  Current functional level related to goals / functional outcomes: See above  Remaining deficits: See above  Education / Equipment: HEP  Plan: Patient agrees to discharge.  Patient goals were partially met. Patient is being discharged due to                                                     ?????    MAX rehab  potential at this time

## 2016-02-29 ENCOUNTER — Encounter: Payer: Self-pay | Admitting: Internal Medicine

## 2016-02-29 ENCOUNTER — Ambulatory Visit (INDEPENDENT_AMBULATORY_CARE_PROVIDER_SITE_OTHER): Payer: Medicare Other | Admitting: Internal Medicine

## 2016-02-29 VITALS — BP 120/70 | HR 68 | Ht 67.0 in | Wt 119.0 lb

## 2016-02-29 DIAGNOSIS — R739 Hyperglycemia, unspecified: Secondary | ICD-10-CM | POA: Diagnosis not present

## 2016-02-29 DIAGNOSIS — M109 Gout, unspecified: Secondary | ICD-10-CM | POA: Diagnosis not present

## 2016-02-29 DIAGNOSIS — G3184 Mild cognitive impairment, so stated: Secondary | ICD-10-CM

## 2016-02-29 DIAGNOSIS — E2839 Other primary ovarian failure: Secondary | ICD-10-CM

## 2016-02-29 DIAGNOSIS — Z780 Asymptomatic menopausal state: Secondary | ICD-10-CM

## 2016-02-29 DIAGNOSIS — R634 Abnormal weight loss: Secondary | ICD-10-CM

## 2016-02-29 LAB — COMPLETE METABOLIC PANEL WITH GFR
ALT: 16 U/L (ref 6–29)
AST: 21 U/L (ref 10–35)
Albumin: 3.7 g/dL (ref 3.6–5.1)
Alkaline Phosphatase: 52 U/L (ref 33–130)
BUN: 24 mg/dL (ref 7–25)
CO2: 26 mmol/L (ref 20–31)
Calcium: 9.5 mg/dL (ref 8.6–10.4)
Chloride: 103 mmol/L (ref 98–110)
Creat: 1.29 mg/dL — ABNORMAL HIGH (ref 0.60–0.88)
GFR, Est African American: 43 mL/min — ABNORMAL LOW (ref >=60)
GFR, Est Non African American: 37 mL/min — ABNORMAL LOW (ref >=60)
Glucose, Bld: 91 mg/dL (ref 65–99)
Potassium: 3.7 mmol/L (ref 3.5–5.3)
Sodium: 141 mmol/L (ref 135–146)
Total Bilirubin: 0.4 mg/dL (ref 0.2–1.2)
Total Protein: 6 g/dL — ABNORMAL LOW (ref 6.1–8.1)

## 2016-02-29 LAB — CBC WITH DIFFERENTIAL/PLATELET
Basophils Absolute: 0 cells/uL (ref 0–200)
Basophils Relative: 0 %
Eosinophils Absolute: 94 cells/uL (ref 15–500)
Eosinophils Relative: 2 %
HCT: 34.4 % — ABNORMAL LOW (ref 35.0–45.0)
Hemoglobin: 11 g/dL — ABNORMAL LOW (ref 11.7–15.5)
Lymphocytes Relative: 16 %
Lymphs Abs: 752 cells/uL — ABNORMAL LOW (ref 850–3900)
MCH: 27.6 pg (ref 27.0–33.0)
MCHC: 32 g/dL (ref 32.0–36.0)
MCV: 86.4 fL (ref 80.0–100.0)
MPV: 9.9 fL (ref 7.5–12.5)
Monocytes Absolute: 376 cells/uL (ref 200–950)
Monocytes Relative: 8 %
Neutro Abs: 3478 cells/uL (ref 1500–7800)
Neutrophils Relative %: 74 %
Platelets: 242 10*3/uL (ref 140–400)
RBC: 3.98 MIL/uL (ref 3.80–5.10)
RDW: 17 % — ABNORMAL HIGH (ref 11.0–15.0)
WBC: 4.7 10*3/uL (ref 3.8–10.8)

## 2016-02-29 LAB — URIC ACID: Uric Acid, Serum: 4.9 mg/dL (ref 2.5–7.0)

## 2016-02-29 NOTE — Progress Notes (Signed)
Location:  [facility] Provider:  Lashika Erker L. Mariea Clonts, D.O., C.M.D.  Code Status: DNR Goals of Care:  Advanced Directives 01/29/2016  Does patient have an advance directive? Yes  Type of Paramedic of [place];Living will;Out of facility DNR (pink MOST or yellow form)  Does patient want to make changes to advanced directive? -  Copy of advanced directive(s) in chart? Yes    Chief Complaint  Patient presents with  . Medical Management of Chronic Issues    still loosing weight    HPI: Patient is a 82 y.o. female seen today for medical management of chronic diseases.    Pt has lost 2 more lbs.   Says she is eating very well.  No diarrhea.  She thought she was gaining b/c her clothes don't fit.    BP perfect.  HR normal.  Has completed therapy.  Right knee is still bothering her.  Feels she has done all she can.  She is still exercising on her own. No falls.    Her feet burn and hurt a lot particularly at night.  She'd like her hba1c.     Agrees to bone density.  Is taking her vitamin D.    Past Medical History:  Diagnosis Date  . Arthritis    oa  . Chronic kidney disease    chronic kidney diseaase follow by primary md  . Diabetes mellitus without complication ([place])    Diet controlled  . Former smoker   . H/O ventricular fibrillation 10 yrs ago dx  . Hypertension   . Prediabetes   . Seasonal allergies   . TIA (transient ischemic attack) none recent    Past Surgical History:  Procedure Laterality Date  . NM MYOCAR PERF WALL MOTION  01/2008   lexiscan myoview - normal pattern of perfusion in all regions, EF 92%, no significant ischemia demonstated  . TONSILLECTOMY  age 89   and adenoids  . TOTAL KNEE ARTHROPLASTY Right 10/06/2015   Procedure: RIGHT TOTAL KNEE ARTHROPLASTY;  Surgeon: [name], MD;  [facility]: [facility];  Service: Orthopedics;  Laterality: Right;  . TRANSTHORACIC ECHOCARDIOGRAM  12/2005   mild DUST, EF=>55%; mild mitral  annular calcif; mild-mod aortic root calcif    No Known Allergies    Medication List       Accurate as of 02/29/16  1:35 PM. Always use your most recent med list.          allopurinol 100 MG tablet Commonly known as:  ZYLOPRIM Take 1 tablet (100 mg total) by mouth daily.   cholecalciferol 1000 units tablet Commonly known as:  VITAMIN D Take 1,000 Units by mouth daily. D3   clopidogrel 75 MG tablet Commonly known as:  PLAVIX TAKE 1 TABLET BY MOUTH EVERY DAY   Folic Acid-Vit U9-WJX B14 2.5-25-1 MG Tabs tablet Commonly known as:  FOLBEE Take 1 tablet by mouth daily.   hydrochlorothiazide 12.5 MG tablet Commonly known as:  HYDRODIURIL TAKE 1 TABLET BY MOUTH DAILY   ketorolac 0.5 % ophthalmic solution Commonly known as:  ACULAR Place 1 drop into the right eye 2 (two) times daily.   metoprolol succinate 50 MG 24 hr tablet Commonly known as:  TOPROL-XL TAKE 1 TABLET BY MOUTH ONCE DAILY   potassium chloride 10 MEQ tablet Commonly known as:  K-DUR,KLOR-CON Take 1 tablet (10 mEq total) by mouth daily.       Review of Systems:  Review of Systems  Constitutional: Positive for weight loss.  Negative for chills, fever and malaise/fatigue.       But feels that clothes were getting tight  HENT: Positive for hearing loss.   Eyes: Negative for blurred vision.       Glasses  Respiratory: Negative for cough and shortness of breath.   Cardiovascular: Negative for chest pain, palpitations and leg swelling.  Gastrointestinal: Negative for diarrhea.  Genitourinary: Negative for dysuria.  Musculoskeletal: Positive for joint pain. Negative for falls.       Right knee  Skin: Negative for itching and rash.  Neurological: Positive for tingling, sensory change and weakness. Negative for headaches.       Feet bilaterally  Endo/Heme/Allergies: Does not bruise/bleed easily.  Psychiatric/Behavioral: Positive for memory loss. Negative for depression. The patient does not have insomnia.      Health Maintenance  Topic Date Due  . DEXA SCAN  11/12/2016 (Originally 09/25/1992)  . TETANUS/TDAP  07/08/2025  . INFLUENZA VACCINE  Completed  . ZOSTAVAX  Completed  . PNA vac Low Risk Adult  Completed    Physical Exam: Vitals:   02/29/16 1326  BP: 120/70  Pulse: 68  Weight: 119 lb (54 kg)  Height: 5\' 7"  (1.702 m)   Body mass index is 18.64 kg/m. Physical Exam  Constitutional: She is oriented to person, place, and time. No distress.  Frail female walking with cane  HENT:  Head: Normocephalic and atraumatic.  Eyes:  glasses  Cardiovascular: Normal rate, regular rhythm, normal heart sounds and intact distal pulses.   Pulmonary/Chest: Effort normal and breath sounds normal. No respiratory distress.  Abdominal: Soft. Bowel sounds are normal. She exhibits no distension. There is no tenderness.  Musculoskeletal: She exhibits tenderness.  Limps on right knee  Neurological: She is alert and oriented to person, place, and time.  Skin: Skin is warm and dry.  Psychiatric: She has a normal mood and affect.    Labs reviewed: Basic Metabolic Panel:  Recent Labs  09/04/15 0901  11/24/15 1121 01/04/16 0905 01/25/16 0824  NA 142  < > 140 141 144  K 3.8  < > 3.6 3.1* 3.8  CL 98  < > 103 102 106  CO2 26  < > 24 26 28   GLUCOSE 102*  < > 101* 106* 98  BUN 36*  < > 37* 28* 23  CREATININE 1.96*  < > 1.77* 1.52* 1.32*  CALCIUM 10.4*  < > 9.9 9.4 9.8  TSH 2.770  --   --   --   --   < > = values in this interval not displayed. Liver Function Tests:  Recent Labs  05/08/15 0910 09/04/15 0901 10/12/15  AST 22 23 25   ALT 13 12 23   ALKPHOS 55 51 59  BILITOT 0.4 0.6  --   PROT 6.1 6.5  --   ALBUMIN 4.1 4.3  --    No results for input(s): LIPASE, AMYLASE in the last 8760 hours. No results for input(s): AMMONIA in the last 8760 hours. CBC:  Recent Labs  09/04/15 0901  09/29/15 0930 10/07/15 0410 10/12/15 11/03/15 1334  WBC 5.6  < > 7.2 9.7 8.3 6.9  NEUTROABS 4.4  --    --   --  7 5.2  HGB  --   --  14.0 11.9* 10.0*  --   HCT 44.2  < > 40.9 36.0 31* 34.6  MCV 94  --  90.5 92.1  --  92  PLT 217  < > 215 239 327 293  < > =  values in this interval not displayed. Lipid Panel:  Recent Labs  05/08/15 0910 09/04/15 0901  CHOL 156 151  HDL 78 85  LDLCALC 59 53  TRIG 97 66  CHOLHDL 2.0 1.8   Lab Results  Component Value Date   HGBA1C 6.5 (H) 09/04/2015    Assessment/Plan 1. Postmenopausal - cont vitamin D therapy  - DG Bone Density; Future  2. Estrogen deficiency - discussed that she is high risk of fall and fx b/c she is so frail  -cont vitamin D - DG Bone Density; Future  3. Loss of weight -ongoing, but pt reports thinking she'd actually gained a little based on her clothes so will cont to monitor and encouraged her to drink glucerna at least one daily - DG Bone Density; Future - CBC with Differential/Platelet - COMPLETE METABOLIC PANEL WITH GFR  4. Mild cognitive impairment with memory loss -has some short term memory loss, but functional well  -f/u mmse at [facility]  5. Gout, unspecified cause, unspecified chronicity, unspecified site -cont allopurinol and make sure uric acid at goal - Uric acid  6. Hyperglycemia - has been mild, does have some neuropathy in her feet also, using glucerna as her supplement due to her glucose, recommended getting more of that - CBC with Differential/Platelet - COMPLETE METABOLIC PANEL WITH GFR - Hemoglobin A1c  Labs/tests ordered:   Orders Placed This Encounter  Procedures  . DG Bone Density    Standing Status:   Future    Standing Expiration Date:   04/30/2017    Order Specific Question:   Reason for Exam (SYMPTOM  OR DIAGNOSIS REQUIRED)    Answer:   postmenopausal, estrogen deficiency    Order Specific Question:   Preferred imaging location?    Answer:   [facility]  . CBC with Differential/Platelet  . COMPLETE METABOLIC PANEL WITH GFR    [facility]  . Hemoglobin A1c  . Uric acid     Next appt:  06/03/2016 med mgt, mmse  Kahliyah Dick L. Dunya Meiners, D.O. [facility] [address], [place] [address] Cell Phone (Mon-Fri 8am-5pm):  [phone] On Call:  [phone] & follow prompts after 5pm & weekends Office Phone:  [phone] Office Fax:  [phone]

## 2016-03-01 ENCOUNTER — Encounter: Payer: Self-pay | Admitting: *Deleted

## 2016-03-01 LAB — HEMOGLOBIN A1C
Hgb A1c MFr Bld: 5.9 % — ABNORMAL HIGH (ref <5.7)
Mean Plasma Glucose: 123 mg/dL

## 2016-03-12 ENCOUNTER — Encounter: Payer: Self-pay | Admitting: Internal Medicine

## 2016-03-18 ENCOUNTER — Ambulatory Visit (INDEPENDENT_AMBULATORY_CARE_PROVIDER_SITE_OTHER): Payer: Medicare Other | Admitting: Physician Assistant

## 2016-03-25 ENCOUNTER — Ambulatory Visit (INDEPENDENT_AMBULATORY_CARE_PROVIDER_SITE_OTHER): Payer: Medicare Other | Admitting: Physician Assistant

## 2016-03-25 DIAGNOSIS — Z96651 Presence of right artificial knee joint: Secondary | ICD-10-CM | POA: Diagnosis not present

## 2016-03-25 NOTE — Progress Notes (Signed)
Office Visit Note   Patient: Mia [name]           Date of Birth: [birth year]           MRN: [id number] Visit Date: 03/25/2016              Requested by: Gayland Curry, DO [place],  [address] PCP: Hollace Kinnier, DO   Assessment & Plan: Visit Diagnoses: No diagnosis found.  Plan:Continue to work on strength and range of motion. Radiographs right knee at return visit.  Follow-Up Instructions: Return in about 6 months (around 09/22/2016) for Radiographs.   Orders:  No orders of the defined types were placed in this encounter.  No orders of the defined types were placed in this encounter.     Procedures: No procedures performed   Clinical Data: No additional findings.   Subjective: Chief Complaint  Patient presents with  . Right Knee - Pain, Follow-up    Patient here today followup 10/06/2015 total right knee replacement. She states she is doing "ok". Her ROM and strength are doing well. She states "it just takes her awhile to get up" and when she does she's good!    Review of Systems   Objective: Vital Signs: There were no vitals taken for this visit.  Physical Exam  Constitutional: She is oriented to person, place, and time. She appears well-developed and well-nourished. No distress.  Musculoskeletal:       Right knee: She exhibits no effusion.  Neurological: She is alert and oriented to person, place, and time.    Right Knee Exam   Range of Motion  Extension: -5  Flexion: 110 (105)   Tests  Varus: negative Valgus: negative  Other  Swelling: none Other tests: no effusion present  Comments:  Incision benign      Specialty Comments:  No specialty comments available.  Imaging: No results found.   PMFS History: Patient Active Problem List   Diagnosis Date Noted  . Status post total right knee replacement 10/06/2015  . RBBB 09/06/2015  . Preoperative cardiovascular examination 09/06/2015  . Mild cognitive impairment  with memory loss 09/04/2015  . Loss of weight 09/04/2015  . Mild cognitive impairment 07/15/2014  . Primary osteoarthritis of right knee 07/15/2014  . Xerosis cutis 07/15/2014  . Superficial burn of groin 11/24/2013  . B12 deficiency 09/23/2013  . Essential hypertension, benign 09/23/2013  . PVC's (premature ventricular contractions) 09/23/2013  . Vitamin D deficiency 09/23/2013  . Hyperglycemia 09/23/2013  . History of TIAs 08/06/2013  . DOE (dyspnea on exertion) 08/06/2013   Past Medical History:  Diagnosis Date  . Arthritis    oa  . Chronic kidney disease    chronic kidney diseaase follow by primary md  . Diabetes mellitus without complication ([place])    Diet controlled  . Former smoker   . H/O ventricular fibrillation 10 yrs ago dx  . Hypertension   . Prediabetes   . Seasonal allergies   . TIA (transient ischemic attack) none recent    Family History  Problem Relation Age of Onset  . Congestive Heart Failure Mother   . Lung cancer Brother   . Prostate cancer Son   . Bipolar disorder Son   . Heart disease Son     Past Surgical History:  Procedure Laterality Date  . NM MYOCAR PERF WALL MOTION  01/2008   lexiscan myoview - normal pattern of perfusion in all regions, EF 92%, no significant ischemia demonstated  .  TONSILLECTOMY  age 42   and adenoids  . TOTAL KNEE ARTHROPLASTY Right 10/06/2015   Procedure: RIGHT TOTAL KNEE ARTHROPLASTY;  Surgeon: [name], MD;  Location: [facility];  Service: Orthopedics;  Laterality: Right;  . TRANSTHORACIC ECHOCARDIOGRAM  12/2005   mild DUST, EF=>55%; mild mitral annular calcif; mild-mod aortic root calcif   Social History   Occupational History  . 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 Smoking status: Former Smoker    Packs/day: 0.25    Years: 40.00    Types: Cigarettes    Quit date: 07/30/2000  . Smokeless tobacco: Never Used  . Alcohol use No  . Drug use: No  . Sexual activity: Not on file

## 2016-03-26 ENCOUNTER — Other Ambulatory Visit: Payer: Self-pay | Admitting: Family Medicine

## 2016-04-05 ENCOUNTER — Other Ambulatory Visit: Payer: Medicare Other

## 2016-04-30 ENCOUNTER — Other Ambulatory Visit: Payer: Self-pay | Admitting: Nurse Practitioner

## 2016-04-30 DIAGNOSIS — R600 Localized edema: Secondary | ICD-10-CM

## 2016-04-30 DIAGNOSIS — I10 Essential (primary) hypertension: Secondary | ICD-10-CM

## 2016-05-31 ENCOUNTER — Ambulatory Visit: Payer: Medicare Other | Admitting: Internal Medicine

## 2016-06-03 ENCOUNTER — Ambulatory Visit (INDEPENDENT_AMBULATORY_CARE_PROVIDER_SITE_OTHER): Payer: Medicare Other | Admitting: Internal Medicine

## 2016-06-03 ENCOUNTER — Encounter: Payer: Self-pay | Admitting: Internal Medicine

## 2016-06-03 VITALS — BP 118/60 | HR 68 | Temp 97.7°F | Ht 67.0 in | Wt 121.0 lb

## 2016-06-03 DIAGNOSIS — R739 Hyperglycemia, unspecified: Secondary | ICD-10-CM | POA: Diagnosis not present

## 2016-06-03 DIAGNOSIS — G609 Hereditary and idiopathic neuropathy, unspecified: Secondary | ICD-10-CM | POA: Insufficient documentation

## 2016-06-03 DIAGNOSIS — Z96651 Presence of right artificial knee joint: Secondary | ICD-10-CM | POA: Diagnosis not present

## 2016-06-03 DIAGNOSIS — M109 Gout, unspecified: Secondary | ICD-10-CM | POA: Insufficient documentation

## 2016-06-03 DIAGNOSIS — R600 Localized edema: Secondary | ICD-10-CM

## 2016-06-03 DIAGNOSIS — I10 Essential (primary) hypertension: Secondary | ICD-10-CM

## 2016-06-03 DIAGNOSIS — G3184 Mild cognitive impairment, so stated: Secondary | ICD-10-CM

## 2016-06-03 LAB — BASIC METABOLIC PANEL
BUN: 23 mg/dL (ref 7–25)
CO2: 29 mmol/L (ref 20–31)
Calcium: 10.1 mg/dL (ref 8.6–10.4)
Chloride: 104 mmol/L (ref 98–110)
Creat: 1.39 mg/dL — ABNORMAL HIGH (ref 0.60–0.88)
Glucose, Bld: 100 mg/dL — ABNORMAL HIGH (ref 65–99)
Potassium: 3.9 mmol/L (ref 3.5–5.3)
Sodium: 143 mmol/L (ref 135–146)

## 2016-06-03 LAB — HEMOGLOBIN A1C
Hgb A1c MFr Bld: 5.9 % — ABNORMAL HIGH (ref <5.7)
Mean Plasma Glucose: 123 mg/dL

## 2016-06-03 NOTE — Progress Notes (Signed)
Location:  [facility] clinic Provider:  Chynah Orihuela L. Mariea Clonts, D.O., C.M.D.  Code Status: DNR Goals of Care:  Advanced Directives 06/03/2016  Does Patient Have a Medical Advance Directive? Yes  Type of Paramedic of [place];Living will;Out of facility DNR (pink MOST or yellow form)  Does patient want to make changes to medical advance directive? No - Patient declined  Copy of [place] in Chart? Yes  Pre-existing out of facility DNR order (yellow form or pink MOST form) Yellow form placed in chart (order not valid for inpatient use)     Chief Complaint  Patient presents with  . Medical Management of Chronic Issues    3MTH FOLLOW-UP    HPI: Patient is a 83 y.o. female seen today for medical management of chronic diseases.    Weight loss:  Actually gained 2 lbs since last visit.  Has been eating well.  Still doing her exercises at home.    Memory loss:  26/30 today, passed clock drawing.  Unchanged from last year.   MMSE - Mini Mental State Exam 06/03/2016 07/15/2014  Orientation to time 5 4  Orientation to Place 5 5  Registration 3 3  Attention/ Calculation 3 5  Recall 1 1  Language- name 2 objects 2 2  Language- repeat 1 1  Language- follow 3 step command 3 2  Language- read & follow direction 1 1  Write a sentence 1 1  Copy design 1 1  Total score 26 26   Knee:   S/p Right TKA--PA said her knee was doing as well as it could.  He thought it would continue to improve.  She doesn't think it's getting better.  She has intervals where balance is better. Had a random fall about a month ago and hit the back of her head.  She is using her rollator walker since that last fall.  She does have neuropathy which awakens her at night.  Wonders if her hba1c has worsened.  Bone density was ordered 02/29/16 and scheduled in November, but it was canceled the day before her appt.  She had fallen at that time per their notes.    She opts not to take  allopurinol--it made her feel crazy.  She thinks it made her feel off balance.    Left ankle continues to swell (had broken her ankle at a young age).  It's always retained fluid some since.    She says that when her diuretic was stopped in the past, she swelled up like a snake that has that happen to it.    She sees her cardiologist next in April.    Past Medical History:  Diagnosis Date  . Arthritis    oa  . Chronic kidney disease    chronic kidney diseaase follow by primary md  . Diabetes mellitus without complication ([place])    Diet controlled  . Former smoker   . H/O ventricular fibrillation 10 yrs ago dx  . Hypertension   . Prediabetes   . Seasonal allergies   . TIA (transient ischemic attack) none recent    Past Surgical History:  Procedure Laterality Date  . NM MYOCAR PERF WALL MOTION  01/2008   lexiscan myoview - normal pattern of perfusion in all regions, EF 92%, no significant ischemia demonstated  . TONSILLECTOMY  age 65   and adenoids  . TOTAL KNEE ARTHROPLASTY Right 10/06/2015   Procedure: RIGHT TOTAL KNEE ARTHROPLASTY;  Surgeon: [name], MD;  Location: [facility];  Service: Orthopedics;  Laterality: Right;  . TRANSTHORACIC ECHOCARDIOGRAM  12/2005   mild DUST, EF=>55%; mild mitral annular calcif; mild-mod aortic root calcif    No Known Allergies  Allergies as of 06/03/2016   No Known Allergies     Medication List       Accurate as of 06/03/16 11:25 AM. Always use your most recent med list.          cholecalciferol 1000 units tablet Commonly known as:  VITAMIN D Take 1,000 Units by mouth daily. D3   clopidogrel 75 MG tablet Commonly known as:  PLAVIX TAKE 1 TABLET BY MOUTH EVERY DAY   Folic Acid-Vit W5-YKD X83 2.5-25-1 MG Tabs tablet Commonly known as:  FOLBEE Take 1 tablet by mouth daily.   hydrochlorothiazide 12.5 MG tablet Commonly known as:  HYDRODIURIL TAKE 1 TABLET BY MOUTH DAILY   ketorolac 0.5 % ophthalmic solution Commonly  known as:  ACULAR Place 1 drop into the right eye 2 (two) times daily.   metoprolol succinate 50 MG 24 hr tablet Commonly known as:  TOPROL-XL TAKE 1 TABLET BY MOUTH ONCE DAILY   potassium chloride 10 MEQ tablet Commonly known as:  K-DUR,KLOR-CON TAKE 1 TABLET(10 MEQ) BY MOUTH DAILY       Review of Systems:  Review of Systems  Constitutional: Negative for chills, fever and weight loss.       Up two lbs since last visit and not wearing more clothes during weight  HENT: Negative for congestion.   Respiratory: Negative for shortness of breath.   Cardiovascular: Positive for leg swelling. Negative for chest pain and palpitations.       Mild chronic, nonpitting  Gastrointestinal: Negative for abdominal pain.  Genitourinary: Positive for urgency.  Musculoskeletal: Positive for falls.       Unsteady gait, using rollator  Neurological: Positive for tingling and sensory change.       Bilateral feet  Psychiatric/Behavioral: Positive for memory loss. Negative for depression.    Health Maintenance  Topic Date Due  . DEXA SCAN  11/12/2016 (Originally 09/25/1992)  . TETANUS/TDAP  07/08/2025  . INFLUENZA VACCINE  Completed  . ZOSTAVAX  Completed  . PNA vac Low Risk Adult  Completed    Physical Exam: Vitals:   06/03/16 1111  BP: 118/60  Pulse: 68  Temp: 97.7 F (36.5 C)  TempSrc: Oral  Weight: 121 lb (54.9 kg)  Height: 5\' 7"  (1.702 m)   Body mass index is 18.95 kg/m. Physical Exam  Constitutional: She is oriented to person, place, and time. No distress.  Thin female, ambulates with rollator walker  Cardiovascular: Normal rate, regular rhythm and normal heart sounds.   Pulmonary/Chest: Effort normal and breath sounds normal. No respiratory distress.  Abdominal: Soft. Bowel sounds are normal. She exhibits no distension. There is no tenderness.  Musculoskeletal:  Ambulating with rollator walker  Neurological: She is alert and oriented to person, place, and time.  Decreased  sensation bilateral feet  Skin: Skin is warm and dry.  Psychiatric: She has a normal mood and affect.    Labs reviewed: Basic Metabolic Panel:  Recent Labs  09/04/15 0901  01/04/16 0905 01/25/16 0824 02/29/16 1357  NA 142  < > 141 144 141  K 3.8  < > 3.1* 3.8 3.7  CL 98  < > 102 106 103  CO2 26  < > 26 28 26   GLUCOSE 102*  < > 106* 98 91  BUN 36*  < >  28* 23 24  CREATININE 1.96*  < > 1.52* 1.32* 1.29*  CALCIUM 10.4*  < > 9.4 9.8 9.5  TSH 2.770  --   --   --   --   < > = values in this interval not displayed. Liver Function Tests:  Recent Labs  09/04/15 0901 10/12/15 02/29/16 1357  AST 23 25 21   ALT 12 23 16   ALKPHOS 51 59 52  BILITOT 0.6  --  0.4  PROT 6.5  --  6.0*  ALBUMIN 4.3  --  3.7   No results for input(s): LIPASE, AMYLASE in the last 8760 hours. No results for input(s): AMMONIA in the last 8760 hours. CBC:  Recent Labs  10/07/15 0410 10/12/15 11/03/15 1334 02/29/16 1357  WBC 9.7 8.3 6.9 4.7  NEUTROABS  --  7 5.2 3,478  HGB 11.9* 10.0*  --  11.0*  HCT 36.0 31* 34.6 34.4*  MCV 92.1  --  92 86.4  PLT 239 327 293 242   Lipid Panel:  Recent Labs  09/04/15 0901  CHOL 151  HDL 85  LDLCALC 53  TRIG 66  CHOLHDL 1.8   Lab Results  Component Value Date   HGBA1C 5.9 (H) 02/29/2016    Assessment/Plan 1. Mild cognitive impairment with memory loss -stable, mmse score identical to last year  2. Essential hypertension, benign -bp at goal with current therapy, no changes needed  3. Status post total right knee replacement -more unsteady since, cont use of rollator walker -may need more therapy--will address next week  4. Hyperglycemia - Hemoglobin Y3F - Basic metabolic panel  5. Lower leg edema -ongoing mild left greater than right due to prior ankle fx; d/c hctz due to risk of dehydration, gout flare and bp running low -may need some diuretic if any true chf component (EF not indicated correctly in echo from 2015--says 92%) -keep f/u with  Dr. Loletha Grayer in april  6. Gout of multiple sites, unspecified cause, unspecified chronicity -stopped her allopurinol due to more side effects than benefits -will stop hctz to prevent gout flares  7.  Peripheral neuropathy--worse lately, check hba1c today -may need more therapy due to this  Labs/tests ordered:   Orders Placed This Encounter  Procedures  . Hemoglobin A1c  . Basic metabolic panel   Next appt:  06/10/2016 f/u on edema, diuretic stopped today  Icelynn Onken L. Aedyn Kempfer, D.O. [facility] [address], [place] [address] Cell Phone (Mon-Fri 8am-5pm):  [phone] On Call:  [phone] & follow prompts after 5pm & weekends Office Phone:  [phone] Office Fax:  [phone]

## 2016-06-04 ENCOUNTER — Encounter: Payer: Self-pay | Admitting: *Deleted

## 2016-06-10 ENCOUNTER — Other Ambulatory Visit: Payer: Self-pay | Admitting: Nurse Practitioner

## 2016-06-10 ENCOUNTER — Encounter: Payer: Self-pay | Admitting: Internal Medicine

## 2016-06-10 ENCOUNTER — Ambulatory Visit (INDEPENDENT_AMBULATORY_CARE_PROVIDER_SITE_OTHER): Payer: Medicare Other | Admitting: Internal Medicine

## 2016-06-10 VITALS — BP 128/80 | HR 76 | Temp 98.0°F | Wt 127.0 lb

## 2016-06-10 DIAGNOSIS — I10 Essential (primary) hypertension: Secondary | ICD-10-CM

## 2016-06-10 DIAGNOSIS — R600 Localized edema: Secondary | ICD-10-CM

## 2016-06-10 DIAGNOSIS — I7300 Raynaud's syndrome without gangrene: Secondary | ICD-10-CM | POA: Diagnosis not present

## 2016-06-10 NOTE — Progress Notes (Signed)
Location:  [facility] clinic Provider:  Simrin Vegh L. Mariea Clonts, D.O., C.M.D.  Code Status: DNR Goals of Care:  Advanced Directives 06/03/2016  Does Patient Have a Medical Advance Directive? Yes  Type of Paramedic of [place];Living will;Out of facility DNR (pink MOST or yellow form)  Does patient want to make changes to medical advance directive? No - Patient declined  Copy of [place] in Chart? Yes  Pre-existing out of facility DNR order (yellow form or pink MOST form) Yellow form placed in chart (order not valid for inpatient use)     Chief Complaint  Patient presents with  . Follow-up    1 week follow-up    HPI: Patient is a 83 y.o. female seen today for f/u after stopping hctz last week.  She feels much better cognitively, more alert, sleeping better and has more energy.  She did gain 6 lbs.  Her ankles--right is also mildly swollen in addition to her usual swollen left ankle.  She also increased he sodium intake with bacon from [employer].  No sob.  No change in urine output.  Discussed getting a f/u echocardiogram at Dr. Lysbeth Penner office.   Over the past month, her fingers have been turning blue.  Finger tips are dusky and cold.  Sometimes feel numb.    Past Medical History:  Diagnosis Date  . Arthritis    oa  . Chronic kidney disease    chronic kidney diseaase follow by primary md  . Diabetes mellitus without complication ([place])    Diet controlled  . Former smoker   . H/O ventricular fibrillation 10 yrs ago dx  . Hypertension   . Prediabetes   . Seasonal allergies   . TIA (transient ischemic attack) none recent    Past Surgical History:  Procedure Laterality Date  . NM MYOCAR PERF WALL MOTION  01/2008   lexiscan myoview - normal pattern of perfusion in all regions, EF 92%, no significant ischemia demonstated  . TONSILLECTOMY  age 49   and adenoids  . TOTAL KNEE ARTHROPLASTY Right 10/06/2015   Procedure: RIGHT TOTAL KNEE  ARTHROPLASTY;  Surgeon: [name], MD;  Location: [facility];  Service: Orthopedics;  Laterality: Right;  . TRANSTHORACIC ECHOCARDIOGRAM  12/2005   mild DUST, EF=>55%; mild mitral annular calcif; mild-mod aortic root calcif    No Known Allergies  Allergies as of 06/10/2016   No Known Allergies     Medication List       Accurate as of 06/10/16  3:04 PM. Always use your most recent med list.          cholecalciferol 1000 units tablet Commonly known as:  VITAMIN D Take 1,000 Units by mouth daily. D3   clopidogrel 75 MG tablet Commonly known as:  PLAVIX TAKE 1 TABLET BY MOUTH EVERY DAY   Folic Acid-Vit F0-XNA T55 2.5-25-1 MG Tabs tablet Commonly known as:  FOLBEE Take 1 tablet by mouth daily.   ketorolac 0.5 % ophthalmic solution Commonly known as:  ACULAR Place 1 drop into the right eye 2 (two) times daily.   metoprolol succinate 50 MG 24 hr tablet Commonly known as:  TOPROL-XL TAKE 1 TABLET BY MOUTH ONCE DAILY   potassium chloride 10 MEQ tablet Commonly known as:  K-DUR,KLOR-CON TAKE 1 TABLET(10 MEQ) BY MOUTH DAILY       Review of Systems:  Review of Systems  Constitutional: Negative for chills and fever.       Wt gain  HENT: Negative for congestion.   Eyes: Negative for blurred vision.       Glasses  Respiratory: Negative for cough, shortness of breath and wheezing.   Cardiovascular: Positive for leg swelling. Negative for chest pain, palpitations, orthopnea and PND.  Gastrointestinal: Negative for abdominal pain, blood in stool, constipation and melena.  Genitourinary: Negative for dysuria.  Musculoskeletal: Negative for falls.  Skin: Negative for itching and rash.  Neurological: Negative for dizziness, loss of consciousness and weakness.  Psychiatric/Behavioral: Positive for memory loss. Negative for depression.       Says memory is doing better    Health Maintenance  Topic Date Due  . DEXA SCAN  11/12/2016 (Originally 09/25/1992)  .  TETANUS/TDAP  07/08/2025  . INFLUENZA VACCINE  Completed  . ZOSTAVAX  Completed  . PNA vac Low Risk Adult  Completed    Physical Exam: Vitals:   06/10/16 1444  BP: 128/80  Pulse: 76  Temp: 98 F (36.7 C)  TempSrc: Oral  Weight: 127 lb (57.6 kg)   Body mass index is 19.89 kg/m. Physical Exam  Constitutional: She is oriented to person, place, and time. No distress.  Walks with rollator walker  Neck: No JVD present.  Cardiovascular: Normal rate, regular rhythm, normal heart sounds and intact distal pulses.   Pulmonary/Chest: Effort normal and breath sounds normal. No respiratory distress. She has no rales.  Musculoskeletal: Normal range of motion.  Walks with rollator walker  Neurological: She is alert and oriented to person, place, and time.  Skin: Skin is warm and dry.  Psychiatric: She has a normal mood and affect.    Labs reviewed: Basic Metabolic Panel:  Recent Labs  09/04/15 0901  01/25/16 0824 02/29/16 1357 06/03/16 1202  NA 142  < > 144 141 143  K 3.8  < > 3.8 3.7 3.9  CL 98  < > 106 103 104  CO2 26  < > 28 26 29   GLUCOSE 102*  < > 98 91 100*  BUN 36*  < > 23 24 23   CREATININE 1.96*  < > 1.32* 1.29* 1.39*  CALCIUM 10.4*  < > 9.8 9.5 10.1  TSH 2.770  --   --   --   --   < > = values in this interval not displayed. Liver Function Tests:  Recent Labs  09/04/15 0901 10/12/15 02/29/16 1357  AST 23 25 21   ALT 12 23 16   ALKPHOS 51 59 52  BILITOT 0.6  --  0.4  PROT 6.5  --  6.0*  ALBUMIN 4.3  --  3.7   No results for input(s): LIPASE, AMYLASE in the last 8760 hours. No results for input(s): AMMONIA in the last 8760 hours. CBC:  Recent Labs  10/07/15 0410 10/12/15 11/03/15 1334 02/29/16 1357  WBC 9.7 8.3 6.9 4.7  NEUTROABS  --  7 5.2 3,478  HGB 11.9* 10.0*  --  11.0*  HCT 36.0 31* 34.6 34.4*  MCV 92.1  --  92 86.4  PLT 239 327 293 242   Lipid Panel:  Recent Labs  09/04/15 0901  CHOL 151  HDL 85  LDLCALC 53  TRIG 66  CHOLHDL 1.8    Lab Results  Component Value Date   HGBA1C 5.9 (H) 06/03/2016    Assessment/Plan 1. 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 Essential hypertension, benign -bp well controlled even w/o hctz  3. Raynaud's disease without gangrene -seems to be a new problem -if becomes more frequent or bothersome to her, would add low dose of amlodipine to help manage -otherwise use regular warming when symptoms occur and be sure to keep hands as warm as possible in the cold  Labs/tests ordered:  No orders of the defined types were placed in this encounter.   Next appt:  07/11/2016   Amarise Lillo L. Raziel Koenigs, D.O. [facility] [address], [place] [address] Cell Phone (Mon-Fri 8am-5pm):  [phone] On Call:  [phone] & follow prompts after 5pm & weekends Office Phone:  [phone] Office Fax:  [phone]

## 2016-06-10 NOTE — Patient Instructions (Signed)
Please call me back if you get excessive swelling or shortness of breath. Call Dr. Lysbeth Penner office to schedule an echocardiogram and to see him a bit sooner.

## 2016-07-11 ENCOUNTER — Ambulatory Visit (INDEPENDENT_AMBULATORY_CARE_PROVIDER_SITE_OTHER): Payer: Medicare Other

## 2016-07-11 VITALS — BP 136/82 | HR 86 | Temp 97.4°F | Ht 67.0 in | Wt 125.2 lb

## 2016-07-11 DIAGNOSIS — Z0000 Encounter for general adult medical examination without abnormal findings: Secondary | ICD-10-CM

## 2016-07-11 MED ORDER — CLOPIDOGREL BISULFATE 75 MG PO TABS: 75.0000 mg | ORAL_TABLET | Freq: Every day | ORAL | 3 refills | Status: DC

## 2016-07-11 NOTE — Progress Notes (Signed)
   I reviewed health advisor's note, was available for consultation and agree with the assessment and plan as written.    hctz was stopped.  plavix will be renewed.  Pt was going to make appt about her toe problem.  Jeancarlo Leffler L. Dianara Smullen, D.O. [facility] [address], [place] [address] Cell Phone (Mon-Fri 8am-5pm):  [phone] On Call:  [phone] & follow prompts after 5pm & weekends Office Phone:  [phone] Office Fax:  [phone]   Quick Notes   Health Maintenance:  None    Abnormal Screen: Last MMSE was done 06/03/16     Patient Concerns:  Pt needs a refill for HCTZ (IF you want her to stay on it)  and for her Plavix. Pt also has had a spot on her right foot btwn her pinky toe and the one before it, that has gotten worse over the past month.    Nurse Concerns:  None

## 2016-07-11 NOTE — Patient Instructions (Addendum)
  Ms. Greaser , Thank you for taking time to come for your Medicare Wellness Visit. I appreciate your ongoing commitment to your health goals. Please review the following plan we discussed and let me know if I can assist you in the future.   These are the goals we discussed: Goals    . Gain weight          Starting 07/11/16, I will attempt to increase my physical activity and I would like to gain some weight.        This is a list of the screening recommended for you and due dates:  Health Maintenance  Topic Date Due  . DEXA scan (bone density measurement)  11/12/2016*  . Tetanus Vaccine  07/08/2025  . Flu Shot  Completed  . Pneumonia vaccines  Completed  *Topic was postponed. The date shown is not the original due date.  I have personally reviewed and addressed the Medicare Annual Wellness questionnaire and have noted the following in the patient's chart:  A. Medical and social history B. Use of alcohol, tobacco or illicit drugs  C. Current medications and supplements D. Functional ability and status E.  Nutritional status F.  Physical activity G. Advance directives H. List of other physicians I.  Hospitalizations, surgeries, and ER visits in previous 12 months J.  Pace to include hearing, vision, cognitive, depression L. Referrals and appointments - none  In addition, I have reviewed and discussed with patient certain preventive protocols, quality metrics, and best practice recommendations. A written personalized care plan for preventive services as well as general preventive health recommendations were provided to patient.  See attached scanned questionnaire for additional information.   Signed,   Allyn Kenner, LPN Health Advisor

## 2016-07-11 NOTE — Progress Notes (Signed)
Subjective:   Mia [name] is a 82 y.o. female who presents for Medicare Annual (Subsequent) preventive examination.  Review of Systems:  Cardiac Risk Factors include: advanced age (>39men, >58 women);hypertension;family history of premature cardiovascular disease;smoking/ tobacco exposure     Objective:     Vitals: BP 136/82 (BP Location: Left Arm, Patient Position: Sitting, Cuff Size: Normal)   Pulse 86   Temp 97.4 F (36.3 C) (Oral)   Ht 5\' 7"  (1.702 m)   Wt 125 lb 3.2 oz (56.8 kg)   BMI 19.61 kg/m   Body mass index is 19.61 kg/m.   Tobacco History  Smoking Status  . Former Smoker  . Packs/day: 0.25  . Years: 40.00  . Types: Cigarettes  . Quit date: 07/30/2000  Smokeless Tobacco  . Never Used     Counseling given: No   Past Medical History:  Diagnosis Date  . Arthritis    oa  . Chronic kidney disease    chronic kidney diseaase follow by primary md  . Diabetes mellitus without complication ([place])    Diet controlled  . Former smoker   . H/O ventricular fibrillation 10 yrs ago dx  . Hypertension   . Prediabetes   . Seasonal allergies   . TIA (transient ischemic attack) none recent   Past Surgical History:  Procedure Laterality Date  . NM MYOCAR PERF WALL MOTION  01/2008   lexiscan myoview - normal pattern of perfusion in all regions, EF 92%, no significant ischemia demonstated  . TONSILLECTOMY  age 36   and adenoids  . TOTAL KNEE ARTHROPLASTY Right 10/06/2015   Procedure: RIGHT TOTAL KNEE ARTHROPLASTY;  Surgeon: [name], MD;  Location: [facility];  Service: Orthopedics;  Laterality: Right;  . TRANSTHORACIC ECHOCARDIOGRAM  12/2005   mild DUST, EF=>55%; mild mitral annular calcif; mild-mod aortic root calcif   Family History  Problem Relation Age of Onset  . Congestive Heart Failure Mother   . Alcohol abuse Father   . Heart disease Son   . Bipolar disorder Son   . Lung cancer Brother   . Post-traumatic stress disorder Daughter    History    Sexual Activity  . Sexual activity: No    Outpatient Encounter Prescriptions as of 07/11/2016  Medication Sig  . cholecalciferol (VITAMIN D) 1000 UNITS tablet Take 1,000 Units by mouth daily. D3  . clopidogrel (PLAVIX) 75 MG tablet TAKE 1 TABLET BY MOUTH EVERY DAY  . Folic Acid-Vit Z9-DJT T01 (FOLBEE) 2.5-25-1 MG TABS tablet Take 1 tablet by mouth daily.  Marland Kitchen ketorolac (ACULAR) 0.5 % ophthalmic solution Place 1 drop into the right eye 2 (two) times daily.  . metoprolol succinate (TOPROL-XL) 50 MG 24 hr tablet TAKE 1 TABLET BY MOUTH ONCE DAILY  . potassium chloride (K-DUR,KLOR-CON) 10 MEQ tablet TAKE 1 TABLET(10 MEQ) BY MOUTH DAILY   No facility-administered encounter medications on file as of 07/11/2016.     Activities of Daily Living In your present state of health, do you have any difficulty performing the following activities: 07/11/2016 10/06/2015  Hearing? N N  Vision? Y N  Difficulty concentrating or making decisions? Y N  Walking or climbing stairs? Y Y  Dressing or bathing? N N  Doing errands, shopping? N N  Preparing Food and eating ? N -  Using the Toilet? N -  In the past six months, have you accidently leaked urine? N -  Do you have problems with loss of bowel control? N -  Managing your  Medications? N -  Managing your Finances? N -  Housekeeping or managing your Housekeeping? N -  Some recent data might be hidden    Patient Care Team: Gayland Curry, DO as PCP - General (Geriatric Medicine) Monna Fam, MD as Consulting Physician (Ophthalmology) Pixie Casino, MD as Consulting Physician (Cardiology) [name], MD as Consulting Physician (Orthopedic Surgery)    Assessment:    Exercise Activities and Dietary recommendations Current Exercise Habits: Home exercise routine, Type of exercise: stretching;strength training/weights;yoga, Time (Minutes): 20, Frequency (Times/Week): 3, Weekly Exercise (Minutes/Week): 60, Intensity: Mild  Goals    . Gain  weight          Starting 07/11/16, I will attempt to increase my physical activity and I would like to gain some weight.       Fall Risk Fall Risk  07/11/2016 06/03/2016 01/29/2016 01/08/2016 12/11/2015  Falls in the past year? Yes No No No No  Number falls in past yr: 2 or more - - - -  Injury with Fall? Yes - - - -  Risk Factor Category  High Fall Risk - - - -  Risk for fall due to : Impaired balance/gait;Impaired mobility;History of fall(s) - - - -  Follow up Falls prevention discussed - - - -   Depression Screen PHQ 2/9 Scores 07/11/2016 06/03/2016 11/06/2015 07/15/2014  PHQ - 2 Score 0 0 0 0     Cognitive Function MMSE - Mini Mental State Exam 07/11/2016 06/03/2016 07/15/2014  Not completed: (No Data) - -  Orientation to time - 5 4  Orientation to Place - 5 5  Registration - 3 3  Attention/ Calculation - 3 5  Recall - 1 1  Language- name 2 objects - 2 2  Language- repeat - 1 1  Language- follow 3 step command - 3 2  Language- read & follow direction - 1 1  Write a sentence - 1 1  Copy design - 1 1  Total score - 26 26        Immunization History  Administered Date(s) Administered  . Influenza, High Dose Seasonal PF 01/27/2016  . Influenza-Unspecified 02/17/2013, 01/18/2014, 12/31/2014  . Pneumococcal Conjugate-13 07/15/2014  . Pneumococcal Polysaccharide-23 05/20/2010  . Tdap 07/09/2015  . Zoster 05/20/2010   Screening Tests Health Maintenance  Topic Date Due  . DEXA SCAN  11/12/2016 (Originally 09/25/1992)  . TETANUS/TDAP  07/08/2025  . INFLUENZA VACCINE  Completed  . PNA vac Low Risk Adult  Completed      Plan:    I have personally reviewed and addressed the Medicare Annual Wellness questionnaire and have noted the following in the patient's chart:  A. Medical and social history B. Use of alcohol, tobacco or illicit drugs  C. Current medications and supplements D. Functional ability and status E.  Nutritional status F.  Physical activity G. Advance  directives H. List of other physicians I.  Hospitalizations, surgeries, and ER visits in previous 12 months J.  [place] to include hearing, vision, cognitive, depression L. Referrals and appointments - none  In addition, I have reviewed and discussed with patient certain preventive protocols, quality metrics, and best practice recommendations. A written personalized care plan for preventive services as well as general preventive health recommendations were provided to patient.  See attached scanned questionnaire for additional information.   Signed,   Allyn Kenner, LPN Health Advisor

## 2016-07-15 ENCOUNTER — Encounter: Payer: Self-pay | Admitting: Internal Medicine

## 2016-07-15 ENCOUNTER — Ambulatory Visit (INDEPENDENT_AMBULATORY_CARE_PROVIDER_SITE_OTHER): Payer: Medicare Other | Admitting: Internal Medicine

## 2016-07-15 VITALS — BP 122/70 | HR 72 | Temp 98.0°F | Ht 67.0 in | Wt 126.0 lb

## 2016-07-15 DIAGNOSIS — L84 Corns and callosities: Secondary | ICD-10-CM

## 2016-07-15 DIAGNOSIS — I10 Essential (primary) hypertension: Secondary | ICD-10-CM | POA: Diagnosis not present

## 2016-07-15 DIAGNOSIS — R600 Localized edema: Secondary | ICD-10-CM | POA: Diagnosis not present

## 2016-07-15 MED ORDER — HYDROCHLOROTHIAZIDE 12.5 MG PO TABS: 12.5000 mg | ORAL_TABLET | ORAL | 5 refills | Status: DC

## 2016-07-15 NOTE — Patient Instructions (Addendum)
Stop the amlodipine/norvasc.   Continue your hctz every other day.  If you are not having swelling at all, stop hydrochlorothiazide also.  Be sure to notify your cardiologist of the changes.

## 2016-07-15 NOTE — Progress Notes (Signed)
Location:  [facility] clinic Provider: Talis Iwan L. Mariea Clonts, D.O., C.M.D.  Code Status: DNR Goals of Care:  Advanced Directives 07/15/2016  Does Patient Have a Medical Advance Directive? Yes  Type of Paramedic of [place];Living will  Does patient want to make changes to medical advance directive? No - Patient declined  Copy of [place] in Chart? Yes  Pre-existing out of facility DNR order (yellow form or pink MOST form) -    Chief Complaint  Patient presents with  . Acute Visit    Patient c/o right foot concenrs- request exam of place in between toes   . Medication Management    Patient states she is on Norvasc 5 mg daily for a long time, originally rx'ed by Cardiologist. Per Sherrie Mustache, NP on 01/29/16 patient was to stop Norvasc (patient doesnt remember being told to stop). Please advise     HPI: Patient is a 83 y.o. female seen today for an acute visit for an area between her toes on her right foot.  She is not to be on norvasc anymore and we have been thinking she's been off of it since September of 2017 (not on list).  Reviewed stopping it today.  She is back to taking hctz every other day due to edema.   Past Medical History:  Diagnosis Date  . Arthritis    oa  . Chronic kidney disease    chronic kidney diseaase follow by primary md  . Diabetes mellitus without complication ([place])    Diet controlled  . Former smoker   . H/O ventricular fibrillation 10 yrs ago dx  . Hypertension   . Prediabetes   . Seasonal allergies   . TIA (transient ischemic attack) none recent    Past Surgical History:  Procedure Laterality Date  . NM MYOCAR PERF WALL MOTION  01/2008   lexiscan myoview - normal pattern of perfusion in all regions, EF 92%, no significant ischemia demonstated  . TONSILLECTOMY  age 30   and adenoids  . TOTAL KNEE ARTHROPLASTY Right 10/06/2015   Procedure: RIGHT TOTAL KNEE ARTHROPLASTY;  Surgeon: [name], MD;   Location: [facility];  Service: Orthopedics;  Laterality: Right;  . TRANSTHORACIC ECHOCARDIOGRAM  12/2005   mild DUST, EF=>55%; mild mitral annular calcif; mild-mod aortic root calcif    No Known Allergies  Allergies as of 07/15/2016   No Known Allergies     Medication List       Accurate as of 07/15/16  3:32 PM. Always use your most recent med list.          cholecalciferol 1000 units tablet Commonly known as:  VITAMIN D Take 1,000 Units by mouth daily. D3   clopidogrel 75 MG tablet Commonly known as:  PLAVIX Take 1 tablet (75 mg total) by mouth daily.   Folic Acid-Vit A3-FTD D22 2.5-25-1 MG Tabs tablet Commonly known as:  FOLBEE Take 1 tablet by mouth daily.   ketorolac 0.5 % ophthalmic solution Commonly known as:  ACULAR Place 1 drop into the right eye 2 (two) times daily.   metoprolol succinate 50 MG 24 hr tablet Commonly known as:  TOPROL-XL TAKE 1 TABLET BY MOUTH ONCE DAILY   NORVASC 5 MG tablet Generic drug:  amLODipine Take 5 mg by mouth daily.   potassium chloride 10 MEQ tablet Commonly known as:  K-DUR,KLOR-CON TAKE 1 TABLET(10 MEQ) BY MOUTH DAILY       Review of Systems:  Review of Systems  Constitutional: Negative for chills, fever, malaise/fatigue and weight loss.  HENT: Positive for hearing loss.   Eyes: Negative for blurred vision.  Respiratory: Negative for cough and shortness of breath.   Cardiovascular: Positive for leg swelling. Negative for chest pain and palpitations.       Left ankle after injury long ago  Gastrointestinal: Negative for abdominal pain.  Genitourinary: Negative for dysuria.  Musculoskeletal: Negative for falls.       Unsteady gait, uses cane  Neurological: Negative for dizziness, loss of consciousness and weakness.  Endo/Heme/Allergies: Does not bruise/bleed easily.  Psychiatric/Behavioral: Positive for memory loss. Negative for depression.       Some short term memory deficits    Health Maintenance  Topic Date Due    . DEXA SCAN  11/12/2016 (Originally 09/25/1992)  . TETANUS/TDAP  07/08/2025  . INFLUENZA VACCINE  Completed  . PNA vac Low Risk Adult  Completed    Physical Exam: Vitals:   07/15/16 1512  BP: 122/70  Pulse: 72  Temp: 98 F (36.7 C)  TempSrc: Oral  Weight: 126 lb (57.2 kg)  Height: 5\' 7"  (1.702 m)   Body mass index is 19.73 kg/m. Physical Exam  Constitutional: She is oriented to person, place, and time. She appears well-developed and well-nourished.  Cardiovascular: Normal rate, regular rhythm, normal heart sounds and intact distal pulses.   Mild edema left greater than right in feet and ankles  Pulmonary/Chest: Effort normal and breath sounds normal. No respiratory distress.  Abdominal: Bowel sounds are normal.  Musculoskeletal: Normal range of motion.  Walks with cane, unsteady  Neurological: She is alert and oriented to person, place, and time.  Skin: Skin is warm and dry. Capillary refill takes less than 2 seconds.  Right foot with callous on ball and also between 3rd and 4th toes, nontender at this time, ulnar deviation of toes, keeps cotton between great and second toe  Psychiatric: She has a normal mood and affect.    Labs reviewed: Basic Metabolic Panel:  Recent Labs  09/04/15 0901  01/25/16 0824 02/29/16 1357 06/03/16 1202  NA 142  < > 144 141 143  K 3.8  < > 3.8 3.7 3.9  CL 98  < > 106 103 104  CO2 26  < > 28 26 29   GLUCOSE 102*  < > 98 91 100*  BUN 36*  < > 23 24 23   CREATININE 1.96*  < > 1.32* 1.29* 1.39*  CALCIUM 10.4*  < > 9.8 9.5 10.1  TSH 2.770  --   --   --   --   < > = values in this interval not displayed. Liver Function Tests:  Recent Labs  09/04/15 0901 10/12/15 02/29/16 1357  AST 23 25 21   ALT 12 23 16   ALKPHOS 51 59 52  BILITOT 0.6  --  0.4  PROT 6.5  --  6.0*  ALBUMIN 4.3  --  3.7   No results for input(s): LIPASE, AMYLASE in the last 8760 hours. No results for input(s): AMMONIA in the last 8760 hours. CBC:  Recent Labs   10/07/15 0410 10/12/15 11/03/15 1334 02/29/16 1357  WBC 9.7 8.3 6.9 4.7  NEUTROABS  --  7 5.2 3,478  HGB 11.9* 10.0*  --  11.0*  HCT 36.0 31* 34.6 34.4*  MCV 92.1  --  92 86.4  PLT 239 327 293 242   Lipid Panel:  Recent Labs  09/04/15 0901  CHOL 151  HDL 85  LDLCALC 53  TRIG 66  CHOLHDL 1.8   Lab Results  Component Value Date   HGBA1C 5.9 (H) 06/03/2016    Assessment/Plan 1. Lower leg edema - d/c amlodipine (again)--pt never actually stopped as directed before - also has restarted her hctz every other day on her own so cont it and monitor edema--if having none and bp can tolerate, may be able to stop hctz also - hydrochlorothiazide (HYDRODIURIL) 12.5 MG tablet; Take 1 tablet (12.5 mg total) by mouth every other day.  Dispense: 15 tablet; Refill: 5  2. Pre-ulcerative corn or callous - cont cotton between toes and see podiatrist - Ambulatory referral to Podiatry  3. Essential hypertension, benign - bp well controlled, not dizzy at present, stop amlodipine due to edema and cont qod hctz - hydrochlorothiazide (HYDRODIURIL) 12.5 MG tablet; Take 1 tablet (12.5 mg total) by mouth every other day.  Dispense: 15 tablet; Refill: 5  Labs/tests ordered:   Orders Placed This Encounter  Procedures  . Ambulatory referral to Podiatry    Referral Priority:   Routine    Referral Type:   Consultation    Referral Reason:   Specialty Services Required    Requested Specialty:   Podiatry    Number of Visits Requested:   1    Next appt:  09/09/2016  Carlena Ruybal L. Naeema Patlan, D.O. [facility] [address], [place] [address] Cell Phone (Mon-Fri 8am-5pm):  [phone] On Call:  [phone] & follow prompts after 5pm & weekends Office Phone:  [phone] Office Fax:  [phone]

## 2016-07-19 ENCOUNTER — Encounter: Payer: Self-pay | Admitting: Internal Medicine

## 2016-07-22 LAB — HM DIABETES EYE EXAM

## 2016-07-23 ENCOUNTER — Encounter: Payer: Self-pay | Admitting: *Deleted

## 2016-08-08 ENCOUNTER — Other Ambulatory Visit: Payer: Medicare Other

## 2016-09-09 ENCOUNTER — Encounter: Payer: Self-pay | Admitting: Internal Medicine

## 2016-09-09 ENCOUNTER — Ambulatory Visit (INDEPENDENT_AMBULATORY_CARE_PROVIDER_SITE_OTHER): Payer: Medicare Other | Admitting: Internal Medicine

## 2016-09-09 VITALS — BP 108/68 | HR 74 | Temp 97.9°F | Wt 126.0 lb

## 2016-09-09 DIAGNOSIS — R600 Localized edema: Secondary | ICD-10-CM

## 2016-09-09 DIAGNOSIS — G609 Hereditary and idiopathic neuropathy, unspecified: Secondary | ICD-10-CM | POA: Diagnosis not present

## 2016-09-09 DIAGNOSIS — R739 Hyperglycemia, unspecified: Secondary | ICD-10-CM | POA: Diagnosis not present

## 2016-09-09 DIAGNOSIS — I10 Essential (primary) hypertension: Secondary | ICD-10-CM

## 2016-09-09 LAB — CBC WITH DIFFERENTIAL/PLATELET
Basophils Absolute: 53 cells/uL (ref 0–200)
Basophils Relative: 1 %
Eosinophils Absolute: 106 cells/uL (ref 15–500)
Eosinophils Relative: 2 %
HCT: 39.7 % (ref 35.0–45.0)
Hemoglobin: 13.1 g/dL (ref 11.7–15.5)
Lymphocytes Relative: 20 %
Lymphs Abs: 1060 cells/uL (ref 850–3900)
MCH: 29.6 pg (ref 27.0–33.0)
MCHC: 33 g/dL (ref 32.0–36.0)
MCV: 89.6 fL (ref 80.0–100.0)
MPV: 10.4 fL (ref 7.5–12.5)
Monocytes Absolute: 477 cells/uL (ref 200–950)
Monocytes Relative: 9 %
Neutro Abs: 3604 cells/uL (ref 1500–7800)
Neutrophils Relative %: 68 %
Platelets: 202 10*3/uL (ref 140–400)
RBC: 4.43 MIL/uL (ref 3.80–5.10)
RDW: 16.5 % — ABNORMAL HIGH (ref 11.0–15.0)
WBC: 5.3 10*3/uL (ref 3.8–10.8)

## 2016-09-09 LAB — COMPLETE METABOLIC PANEL WITH GFR
ALT: 12 U/L (ref 6–29)
AST: 20 U/L (ref 10–35)
Albumin: 3.9 g/dL (ref 3.6–5.1)
Alkaline Phosphatase: 66 U/L (ref 33–130)
BUN: 26 mg/dL — ABNORMAL HIGH (ref 7–25)
CO2: 26 mmol/L (ref 20–31)
Calcium: 9.9 mg/dL (ref 8.6–10.4)
Chloride: 105 mmol/L (ref 98–110)
Creat: 1.46 mg/dL — ABNORMAL HIGH (ref 0.60–0.88)
GFR, Est African American: 37 mL/min — ABNORMAL LOW (ref >=60)
GFR, Est Non African American: 32 mL/min — ABNORMAL LOW (ref >=60)
Glucose, Bld: 101 mg/dL — ABNORMAL HIGH (ref 65–99)
Potassium: 3.4 mmol/L — ABNORMAL LOW (ref 3.5–5.3)
Sodium: 142 mmol/L (ref 135–146)
Total Bilirubin: 0.6 mg/dL (ref 0.2–1.2)
Total Protein: 6.6 g/dL (ref 6.1–8.1)

## 2016-09-09 NOTE — Progress Notes (Signed)
Location:  [facility] clinic Provider:  Jibri Schriefer L. Mariea Clonts, D.O., C.M.D.  Code Status: DNR Goals of Care:  Advanced Directives 09/09/2016  Does Patient Have a Medical Advance Directive? Yes  Type of Paramedic of [place];Living will;Out of facility DNR (pink MOST or yellow form)  Does patient want to make changes to medical advance directive? -  Copy of [place] in Chart? Yes  Pre-existing out of facility DNR order (yellow form or pink MOST form) Yellow form placed in chart (order not valid for inpatient use)     Chief Complaint  Patient presents with  . Medical Management of Chronic Issues    28mth follow-up    HPI: Patient is a 83 y.o. female seen today for medical management of chronic diseases.  Nothing new.    Memory loss:  Had a conflict when she had her bone density before so missed it.  She has written it down to call to reschedule.  Also missed her podiatry appt and has to reschedule it.    Weight stable.  Has not been exercising partly b/c she's not a morning person.  Did go once last week.  Says she's going to get back to it.  Requests her hba1c be checked today.  Has been great for a long time.  She is requesting a prescription for a walker that is portable.  Is going to her granddaughter's wedding.  She has not had more falls since she got her rollator but cannot lift it to get it in the car and she paid for it so hopefully insurance will cover her foldaway one.  Past Medical History:  Diagnosis Date  . Arthritis    oa  . Chronic kidney disease    chronic kidney diseaase follow by primary md  . Diabetes mellitus without complication ([place])    Diet controlled  . Former smoker   . H/O ventricular fibrillation 10 yrs ago dx  . Hypertension   . Prediabetes   . Seasonal allergies   . TIA (transient ischemic attack) none recent    Past Surgical History:  Procedure Laterality Date  . NM MYOCAR PERF WALL MOTION  01/2008   lexiscan  myoview - normal pattern of perfusion in all regions, EF 92%, no significant ischemia demonstated  . TONSILLECTOMY  age 36   and adenoids  . TOTAL KNEE ARTHROPLASTY Right 10/06/2015   Procedure: RIGHT TOTAL KNEE ARTHROPLASTY;  Surgeon: [name], MD;  Location: [facility];  Service: Orthopedics;  Laterality: Right;  . TRANSTHORACIC ECHOCARDIOGRAM  12/2005   mild DUST, EF=>55%; mild mitral annular calcif; mild-mod aortic root calcif    No Known Allergies  Allergies as of 09/09/2016   No Known Allergies     Medication List       Accurate as of 09/09/16 11:23 AM. Always use your most recent med list.          cholecalciferol 1000 units tablet Commonly known as:  VITAMIN D Take 1,000 Units by mouth daily. D3   clopidogrel 75 MG tablet Commonly known as:  PLAVIX Take 1 tablet (75 mg total) by mouth daily.   Folic Acid-Vit F5-DDU K02 2.5-25-1 MG Tabs tablet Commonly known as:  FOLBEE Take 1 tablet by mouth daily.   hydrochlorothiazide 12.5 MG tablet Commonly known as:  HYDRODIURIL Take 1 tablet (12.5 mg total) by mouth every other day.   ketorolac 0.5 % ophthalmic solution Commonly known as:  ACULAR Place 1 drop into the  right eye 2 (two) times daily.   metoprolol succinate 50 MG 24 hr tablet Commonly known as:  TOPROL-XL TAKE 1 TABLET BY MOUTH ONCE DAILY   potassium chloride 10 MEQ tablet Commonly known as:  K-DUR,KLOR-CON TAKE 1 TABLET(10 MEQ) BY MOUTH DAILY       Review of Systems:  Review of Systems  Constitutional: Negative for chills and fever.       Weight stable now  HENT: Negative for congestion.   Eyes: Negative for blurred vision.  Respiratory: Negative for cough and shortness of breath.   Cardiovascular: Negative for chest pain and palpitations.  Gastrointestinal: Negative for abdominal pain, blood in stool, constipation, diarrhea and melena.  Genitourinary: Negative for dysuria.  Musculoskeletal: Positive for joint pain. Negative for falls.    Skin: Negative for itching and rash.  Neurological: Negative for dizziness and loss of consciousness.  Endo/Heme/Allergies: Does not bruise/bleed easily.  Psychiatric/Behavioral: Positive for memory loss. Negative for depression.    Health Maintenance  Topic Date Due  . DEXA SCAN  11/12/2016 (Originally 09/25/1992)  . INFLUENZA VACCINE  12/18/2016  . TETANUS/TDAP  07/08/2025  . PNA vac Low Risk Adult  Completed    Physical Exam: Vitals:   09/09/16 1053  BP: 108/68  Pulse: 74  Temp: 97.9 F (36.6 C)  TempSrc: Oral  SpO2: 95%  Weight: 126 lb (57.2 kg)   Body mass index is 19.73 kg/m. Physical Exam  Constitutional: She is oriented to person, place, and time. She appears well-developed. No distress.  Frail female  Cardiovascular: Normal rate, regular rhythm, normal heart sounds and intact distal pulses.   Chronic left ankle swelling  Pulmonary/Chest: Effort normal and breath sounds normal. No respiratory distress.  Abdominal: Bowel sounds are normal.  Musculoskeletal:  Walks with rolator walker she bought after a fall  Neurological: She is alert and oriented to person, place, and time.  Skin: Skin is warm and dry.  Psychiatric: She has a normal mood and affect.    Labs reviewed: Basic Metabolic Panel:  Recent Labs  01/25/16 0824 02/29/16 1357 06/03/16 1202  NA 144 141 143  K 3.8 3.7 3.9  CL 106 103 104  CO2 28 26 29   GLUCOSE 98 91 100*  BUN 23 24 23   CREATININE 1.32* 1.29* 1.39*  CALCIUM 9.8 9.5 10.1   Liver Function Tests:  Recent Labs  10/12/15 02/29/16 1357  AST 25 21  ALT 23 16  ALKPHOS 59 52  BILITOT  --  0.4  PROT  --  6.0*  ALBUMIN  --  3.7   No results for input(s): LIPASE, AMYLASE in the last 8760 hours. No results for input(s): AMMONIA in the last 8760 hours. CBC:  Recent Labs  10/07/15 0410 10/12/15 11/03/15 1334 02/29/16 1357  WBC 9.7 8.3 6.9 4.7  NEUTROABS  --  7 5.2 3,478  HGB 11.9* 10.0*  --  11.0*  HCT 36.0 31* 34.6 34.4*   MCV 92.1  --  92 86.4  PLT 239 327 293 242   Lipid Panel: No results for input(s): CHOL, HDL, LDLCALC, TRIG, CHOLHDL, LDLDIRECT in the last 8760 hours. Lab Results  Component Value Date   HGBA1C 5.9 (H) 06/03/2016   Assessment/Plan 1. Lower leg edema Left greater than right -chronic, cont same regimen, elevate feet -had prior ankle fx left long ago  2. Essential hypertension, benign -bp at goal, runs low, tried to get her off hctz, but on 1/2 pill qod b/c edema got worse and she  got hypertensive w/o it, also on toprol xl 50mg  daily - COMPLETE METABOLIC PANEL WITH GFR - CBC with Differential/Platelet  3. Hereditary and idiopathic peripheral neuropathy -ongoing, chronic, not on medication--very sensitive and is frail so I'm concerned about falls with it  4. Hyperglycemia -has been stable for a long time, but she worries about this so recheck at her request - Hemoglobin A1c   Labs/tests ordered:   Orders Placed This Encounter  Procedures  . Hemoglobin A1c  . COMPLETE METABOLIC PANEL WITH GFR  . CBC with Differential/Platelet   Next appt: 12/09/2016    Earnest Mcgillis L. Amor Packard, D.O. [facility] [address], [place] [address] Cell Phone (Mon-Fri 8am-5pm):  [phone] On Call:  [phone] & follow prompts after 5pm & weekends Office Phone:  [phone] Office Fax:  [phone]

## 2016-09-10 LAB — HEMOGLOBIN A1C
Hgb A1c MFr Bld: 5.8 % — ABNORMAL HIGH (ref <5.7)
Mean Plasma Glucose: 120 mg/dL

## 2016-09-12 ENCOUNTER — Encounter: Payer: Self-pay | Admitting: *Deleted

## 2016-09-16 ENCOUNTER — Ambulatory Visit (INDEPENDENT_AMBULATORY_CARE_PROVIDER_SITE_OTHER): Payer: Medicare Other | Admitting: Internal Medicine

## 2016-09-16 ENCOUNTER — Encounter: Payer: Self-pay | Admitting: Internal Medicine

## 2016-09-16 VITALS — BP 149/73 | HR 70 | Ht 67.0 in | Wt 129.8 lb

## 2016-09-16 DIAGNOSIS — Z96659 Presence of unspecified artificial knee joint: Secondary | ICD-10-CM

## 2016-09-16 DIAGNOSIS — R2689 Other abnormalities of gait and mobility: Secondary | ICD-10-CM | POA: Diagnosis not present

## 2016-09-16 DIAGNOSIS — I10 Essential (primary) hypertension: Secondary | ICD-10-CM

## 2016-09-16 DIAGNOSIS — Z9181 History of falling: Secondary | ICD-10-CM | POA: Diagnosis not present

## 2016-09-16 DIAGNOSIS — M6281 Muscle weakness (generalized): Secondary | ICD-10-CM | POA: Diagnosis not present

## 2016-09-16 NOTE — Patient Instructions (Addendum)
Dr. Debara Pickett recommends that you wear compression stockings on left leg  Dr. Debara Pickett has ordered home health - physical therapy (for falls) -- balance -- conditioning/strengthening  This referral will be sent to [facility] - they will contact you to arrange treatment   Your physician wants you to follow-up in: ONE YEAR with Dr. Debara Pickett. You will receive a reminder letter in the mail two months in advance. If you don't receive a letter, please call our office to schedule the follow-up appointment.

## 2016-09-16 NOTE — Progress Notes (Signed)
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 She has over the past year done very well, eats a pretty healthy diet and exercise without any difficulty. She continues to have some cold feet and hands but has had a negative rheumatologic workup with a negative ANA in the past. She does exercise about 3 times a week for 30 minutes. Recently she has noted worsening exercise tolerance and feels that she gets short of breath easier toward the end of exercise. In fact she cannot go nearly as far as she used to go. She denies any chest pain with this however she notes that it does take her longer to recover after exercise. Her last stress test was negative in 2009.  She returns today for followup of her stress test. This was negative for ischemia with an EF greater than 70%. She reports her shortness of breath is resolved and she's not noting palpitations as frequently as she had before.  I saw mod Carnegie back in the office today. Overall she is doing well without any complaints. Unfortunate she's been missing some meals and is grieving over the loss of her son which was unexpected. She denies any chest pain or worsening shortness of breath. Weight is getting to be a little bit low and she is aware that she needs to increase her calorie intake. Overall though she denies any palpitations.  Ms. Henner returns today for follow-up. She is contemplating right knee surgery due to continued pain and decreased mobility. She is scheduled to have surgery with Dr. Ninfa Linden. From a preoperative standpoint, she denies any chest pain or worsening shortness of breath with exertion. She is able to do for metabolic: Selective activity without any problems. She occasionally gets some leg swelling but that tends to go away when elevating her feet.  09/16/2016  [name] was seen today  in follow-up. Overall she seems to be doing well. She denies any recurrent TIA. She did undergo knee surgery but she's had a lot of problems with her knee since then. She reports having several falls which seem to be unprompted. She denies any dizziness or syncope leading up to those falls. Blood pressure is been fairly well-controlled if not slightly high but she denies any low blood pressures.  PMHx:  Past Medical History:  Diagnosis Date  . Arthritis    oa  . Chronic kidney disease    chronic kidney diseaase follow by primary md  . Diabetes mellitus without complication ([place])    Diet controlled  . Former smoker   . H/O ventricular fibrillation 10 yrs ago dx  . Hypertension   . Prediabetes   . Seasonal allergies   . TIA (transient ischemic attack) none recent    Past Surgical History:  Procedure Laterality Date  . NM MYOCAR PERF WALL MOTION  01/2008   lexiscan myoview - normal pattern of perfusion in all regions, EF 92%, no significant ischemia demonstated  . TONSILLECTOMY  age 68   and adenoids  . TOTAL KNEE ARTHROPLASTY Right 10/06/2015   Procedure: RIGHT TOTAL KNEE ARTHROPLASTY;  Surgeon: [name], MD;  Location: [facility];  Service: Orthopedics;  Laterality: Right;  . TRANSTHORACIC ECHOCARDIOGRAM  12/2005   mild DUST, EF=>55%; mild mitral annular calcif; mild-mod aortic root calcif    FAMHx:  Family History  Problem Relation Age of  Onset  . Congestive Heart Failure Mother   . Alcohol abuse Father   . Heart disease Son   . Bipolar disorder Son   . Lung cancer Brother   . Post-traumatic stress disorder Daughter     SOCHx:   reports that she quit smoking about 16 years ago. Her smoking use included Cigarettes. She has a 10.00 pack-year smoking history. She has never used smokeless tobacco. She reports that she drinks about 4.2 oz of alcohol per week . She reports that she does not use drugs.  ALLERGIES:  No Known Allergies  ROS: Pertinent items noted in HPI  and remainder of comprehensive ROS otherwise negative.  HOME MEDS: Current Outpatient Prescriptions  Medication Sig Dispense Refill  . cholecalciferol (VITAMIN D) 1000 UNITS tablet Take 1,000 Units by mouth daily. D3    . clopidogrel (PLAVIX) 75 MG tablet Take 1 tablet (75 mg total) by mouth daily. 90 tablet 3  . Folic Acid-Vit P7-TGG Y69 (FOLBEE) 2.5-25-1 MG TABS tablet Take 1 tablet by mouth daily.    . hydrochlorothiazide (HYDRODIURIL) 12.5 MG tablet Take 1 tablet (12.5 mg total) by mouth every other day. 15 tablet 5  . ketorolac (ACULAR) 0.5 % ophthalmic solution Place 1 drop into the right eye 2 (two) times daily.    . metoprolol succinate (TOPROL-XL) 50 MG 24 hr tablet TAKE 1 TABLET BY MOUTH ONCE DAILY 90 tablet 2  . potassium chloride (K-DUR,KLOR-CON) 10 MEQ tablet TAKE 1 TABLET(10 MEQ) BY MOUTH DAILY 30 tablet 3   No current facility-administered medications for this visit.     LABS/IMAGING: No results found for this or any previous visit (from the past 48 hour(s)). No results found.  VITALS: BP (!) 149/73   Pulse 70   Ht 5\' 7"  (1.702 m)   Wt 129 lb 12.8 oz (58.9 kg)   BMI 20.33 kg/m   EXAM: General appearance: alert and no distress Neck: no carotid bruit and no JVD Lungs: clear to auscultation bilaterally Heart: regular rate and rhythm, S1, S2 normal, no murmur, click, rub or gallop Abdomen: soft, non-tender; bowel sounds normal; no masses,  no organomegaly Extremities: extremities normal, atraumatic, no cyanosis or edema Pulses: 2+ and symmetric Skin: Skin color, texture, turgor normal. No rashes or lesions Neurologic: Grossly normal Psych: Pleasant  EKG: Normal sinus rhythm with sinus arrhythmia at 70, RBBB  ASSESSMENT: 1. Recurrent falls 2. Hypertension 3. History of TIA 4. Right bundle branch block 5. Recent right TKR  PLAN: 1.   [name] describes recurrent falls recently which may be due to weakness and/or balance problems. She had right total knee  replacement and did fairly well with rehabilitation but she says it still causes her pain and problems with arthritis. They should benefit from physical therapy evaluation and possibly gait training to help her avoid falls. Blood pressure appears to be well controlled and she is not had recurrent TIA.  Follow-up in 1 year or sooner as needed.  Pixie Casino, MD, [facility] Attending Cardiologist [place] 09/16/2016, 3:50 PM

## 2016-09-19 ENCOUNTER — Telehealth: Payer: Self-pay

## 2016-09-19 NOTE — Telephone Encounter (Signed)
Barnabas Lister called from [facility] is stating that patient does not qualify for home care because she is not home bound. Barnabas Lister talked with patient about another option which would be the [facility] center which has a fall prevention program, patient is interested.  Send referral to [facility]. Fax: [phone], Phone: [phone]  Non-urgent okay to wait for Dr. Mariea Clonts.

## 2016-09-21 ENCOUNTER — Other Ambulatory Visit: Payer: Self-pay | Admitting: Internal Medicine

## 2016-09-23 ENCOUNTER — Ambulatory Visit (INDEPENDENT_AMBULATORY_CARE_PROVIDER_SITE_OTHER): Payer: Medicare Other | Admitting: Physician Assistant

## 2016-09-23 ENCOUNTER — Encounter (INDEPENDENT_AMBULATORY_CARE_PROVIDER_SITE_OTHER): Payer: Self-pay

## 2016-09-23 NOTE — Telephone Encounter (Signed)
Medication  amLODipine (NORVASC) 5 MG tablet [9071]  amLODipine (NORVASC) 5 MG tablet [[id number]] DISCONTINUED  Order Details  Dose, Route, Frequency: As Directed   Dispense Quantity:  90 tablet Refills:  2 Fills remaining:  --        Sig: TAKE 1 TABLET BY MOUTH ONCE DAILY       Discontinue Date:  01/29/2016 [place] Discontinue User:  Lauree Chandler, NP Discontinue Reason:  --  Written Date:  12/06/15 Expiration Date:  12/05/16    Start Date:  12/06/15 End Date:  01/29/16         Ordering Provider:  Pixie Casino, MD DEA #:  [id number] NPI:  [id number]   Authorizing Provider:  Pixie Casino, MD DEA #:  [id number] NPI:  [id number]   Ordering User:  Fidel Levy, RN            Original Order:  amLODipine (NORVASC) 5 MG tablet [[id number]]    Pharmacy:  [facility] [place], [place] - [address] AT [facility] [place] #:  [id number]    Pharmacy Comments:  --       Fill quantity remaining:  -- Fill quantity used:  --

## 2016-09-24 NOTE — Telephone Encounter (Signed)
Pt was actually referred by cardiology for her PT and it appears they already ordered outpatient PT also?

## 2016-09-24 NOTE — Telephone Encounter (Signed)
Mia [name] notified and will check into this.

## 2016-10-03 ENCOUNTER — Other Ambulatory Visit: Payer: Self-pay | Admitting: Internal Medicine

## 2016-10-21 ENCOUNTER — Other Ambulatory Visit: Payer: Self-pay | Admitting: Nurse Practitioner

## 2016-10-21 DIAGNOSIS — R600 Localized edema: Secondary | ICD-10-CM

## 2016-10-21 DIAGNOSIS — I10 Essential (primary) hypertension: Secondary | ICD-10-CM

## 2016-12-09 ENCOUNTER — Ambulatory Visit: Payer: Medicare Other | Admitting: Internal Medicine

## 2016-12-25 ENCOUNTER — Other Ambulatory Visit: Payer: Self-pay | Admitting: Internal Medicine

## 2016-12-25 DIAGNOSIS — I10 Essential (primary) hypertension: Secondary | ICD-10-CM

## 2016-12-25 DIAGNOSIS — R600 Localized edema: Secondary | ICD-10-CM

## 2017-01-03 ENCOUNTER — Other Ambulatory Visit: Payer: Self-pay | Admitting: Internal Medicine

## 2017-02-22 ENCOUNTER — Other Ambulatory Visit: Payer: Self-pay | Admitting: Internal Medicine

## 2017-02-22 DIAGNOSIS — I10 Essential (primary) hypertension: Secondary | ICD-10-CM

## 2017-02-22 DIAGNOSIS — R600 Localized edema: Secondary | ICD-10-CM

## 2017-03-03 ENCOUNTER — Ambulatory Visit: Payer: Medicare Other | Admitting: Internal Medicine

## 2017-03-04 ENCOUNTER — Other Ambulatory Visit: Payer: Self-pay | Admitting: Nurse Practitioner

## 2017-03-04 DIAGNOSIS — R600 Localized edema: Secondary | ICD-10-CM

## 2017-03-04 DIAGNOSIS — I10 Essential (primary) hypertension: Secondary | ICD-10-CM

## 2017-03-20 ENCOUNTER — Ambulatory Visit: Payer: Medicare Other | Admitting: Internal Medicine

## 2017-03-26 ENCOUNTER — Other Ambulatory Visit: Payer: Self-pay | Admitting: Internal Medicine

## 2017-03-26 DIAGNOSIS — I10 Essential (primary) hypertension: Secondary | ICD-10-CM

## 2017-03-26 DIAGNOSIS — R600 Localized edema: Secondary | ICD-10-CM

## 2017-04-16 ENCOUNTER — Other Ambulatory Visit: Payer: Self-pay | Admitting: Internal Medicine

## 2017-04-16 DIAGNOSIS — R600 Localized edema: Secondary | ICD-10-CM

## 2017-04-16 DIAGNOSIS — I10 Essential (primary) hypertension: Secondary | ICD-10-CM

## 2017-05-15 ENCOUNTER — Ambulatory Visit (INDEPENDENT_AMBULATORY_CARE_PROVIDER_SITE_OTHER): Payer: Medicare Other | Admitting: Internal Medicine

## 2017-05-15 ENCOUNTER — Encounter: Payer: Self-pay | Admitting: Internal Medicine

## 2017-05-15 VITALS — BP 148/80 | HR 68 | Temp 98.2°F | Ht 67.0 in | Wt 126.0 lb

## 2017-05-15 DIAGNOSIS — M109 Gout, unspecified: Secondary | ICD-10-CM

## 2017-05-15 DIAGNOSIS — G3184 Mild cognitive impairment, so stated: Secondary | ICD-10-CM

## 2017-05-15 DIAGNOSIS — I10 Essential (primary) hypertension: Secondary | ICD-10-CM | POA: Diagnosis not present

## 2017-05-15 DIAGNOSIS — I7300 Raynaud's syndrome without gangrene: Secondary | ICD-10-CM

## 2017-05-15 DIAGNOSIS — L942 Calcinosis cutis: Secondary | ICD-10-CM

## 2017-05-15 DIAGNOSIS — R739 Hyperglycemia, unspecified: Secondary | ICD-10-CM | POA: Diagnosis not present

## 2017-05-15 DIAGNOSIS — R1310 Dysphagia, unspecified: Secondary | ICD-10-CM

## 2017-05-15 NOTE — Progress Notes (Signed)
Location:  [facility] clinic Provider:  Amal Saiki L. Mariea Clonts, D.O., C.M.D.  Code Status: DNR Goals of Care:  Advanced Directives 05/15/2017  Does Patient Have a Medical Advance Directive? Yes  Type of Paramedic of [place];Living will;Out of facility DNR (pink MOST or yellow form)  Does patient want to make changes to medical advance directive? No - Patient declined  Copy of [place] in Chart? Yes  Pre-existing out of facility DNR order (yellow form or pink MOST form) Yellow form placed in chart (order not valid for inpatient use)   Chief Complaint  Patient presents with  . Medical Management of Chronic Issues    follow-up    HPI: Patient is a 83 y.o. female seen today for medical management of chronic diseases.    She's in the [place] system and knows she will eventually need to go into [place].  Left hand swollen and she's unable to close her grip.  One morning the one finger looked like a sausage.  The hand is sore, but not painful.  She can't close her fist to drive anymore--that is painful.  She cannot cut meat or lift heavy pots.  She cannot drive and gave her son her car.    She does not feel bad.  She notices there are times when she feels like she'll choke even when she drinks water.  There is some family history with that.  She's always had some Raynaud's type symptoms.    Past Medical History:  Diagnosis Date  . Arthritis    oa  . Chronic kidney disease    chronic kidney diseaase follow by primary md  . Diabetes mellitus without complication ([place])    Diet controlled  . Former smoker   . H/O ventricular fibrillation 10 yrs ago dx  . Hypertension   . Prediabetes   . Seasonal allergies   . TIA (transient ischemic attack) none recent    Past Surgical History:  Procedure Laterality Date  . NM MYOCAR PERF WALL MOTION  01/2008   lexiscan myoview - normal pattern of perfusion in all regions, EF 92%, no significant ischemia demonstated  .  TONSILLECTOMY  age 83   and adenoids  . TOTAL KNEE ARTHROPLASTY Right 10/06/2015   Procedure: RIGHT TOTAL KNEE ARTHROPLASTY;  Surgeon: [name], MD;  Location: [facility];  Service: Orthopedics;  Laterality: Right;  . TRANSTHORACIC ECHOCARDIOGRAM  12/2005   mild DUST, EF=>55%; mild mitral annular calcif; mild-mod aortic root calcif    No Known Allergies  Outpatient Encounter Medications as of 05/15/2017  Medication Sig  . cholecalciferol (VITAMIN D) 1000 UNITS tablet Take 1,000 Units by mouth daily. D3  . clopidogrel (PLAVIX) 75 MG tablet Take 1 tablet (75 mg total) by mouth daily.  . hydrochlorothiazide (HYDRODIURIL) 12.5 MG tablet TAKE 1 TABLET(12.5 MG) BY MOUTH EVERY OTHER DAY  . ketorolac (ACULAR) 0.5 % ophthalmic solution Place 1 drop into the right eye 2 (two) times daily.  . metoprolol succinate (TOPROL-XL) 50 MG 24 hr tablet Take 1 tablet (50 mg total) by mouth daily.  . potassium chloride (K-DUR,KLOR-CON) 10 MEQ tablet TAKE 1 TABLET BY MOUTH DAILY  . [DISCONTINUED] Folic Acid-Vit I6-EVO J50 (FOLBEE) 2.5-25-1 MG TABS tablet Take 1 tablet by mouth daily.   No facility-administered encounter medications on file as of 05/15/2017.     Review of Systems:  Review of Systems  Constitutional: Negative for chills, fever and malaise/fatigue.  HENT: Negative for congestion and hearing loss.  Eyes: Negative for blurred vision.  Respiratory: Negative for cough and shortness of breath.   Cardiovascular: Negative for chest pain, palpitations and leg swelling.  Gastrointestinal: Negative for abdominal pain, blood in stool, constipation, diarrhea, heartburn, melena, nausea and vomiting.       Dysphagia  Genitourinary: Negative for dysuria.  Musculoskeletal: Positive for joint pain. Negative for falls.       Cannot make fist with left hand  Skin: Negative for itching and rash.       Darkening of skin of left hand   Neurological: Negative for dizziness, loss of consciousness and  weakness.  Psychiatric/Behavioral: Positive for memory loss. Negative for depression. The patient is not nervous/anxious and does not have insomnia.     Health Maintenance  Topic Date Due  . DEXA SCAN  09/25/1992  . TETANUS/TDAP  07/08/2025  . INFLUENZA VACCINE  Completed  . PNA vac Low Risk Adult  Completed    Physical Exam: Vitals:   05/15/17 1132  BP: (!) 148/80  Pulse: 68  Temp: 98.2 F (36.8 C)  TempSrc: Oral  Weight: 126 lb (57.2 kg)  Height: 5\' 7"  (1.702 m)   Body mass index is 19.73 kg/m. Physical Exam  Constitutional: She is oriented to person, place, and time. No distress.  Thin black female, ambulates with rollator walker  HENT:  Head: Normocephalic and atraumatic.  Eyes:  glasses  Cardiovascular: Normal rate, regular rhythm, normal heart sounds and intact distal pulses.  Pulmonary/Chest: Effort normal and breath sounds normal. No respiratory distress.  Abdominal: Soft. Bowel sounds are normal.  Musculoskeletal: Normal range of motion.  Ambulates with rollator; unable to make a fist or grip with left hand, fingers are tight with loss of creases, some hyperpigmentation of skin on left greater than right hand  Neurological: She is alert and oriented to person, place, and time.  Skin: Capillary refill takes less than 2 seconds.  Psychiatric: She has a normal mood and affect.    Labs reviewed: Basic Metabolic Panel: Recent Labs    06/03/16 1202 09/09/16 1150  NA 143 142  K 3.9 3.4*  CL 104 105  CO2 29 26  GLUCOSE 100* 101*  BUN 23 26*  CREATININE 1.39* 1.46*  CALCIUM 10.1 9.9   Liver Function Tests: Recent Labs    09/09/16 1150  AST 20  ALT 12  ALKPHOS 66  BILITOT 0.6  PROT 6.6  ALBUMIN 3.9   No results for input(s): LIPASE, AMYLASE in the last 8760 hours. No results for input(s): AMMONIA in the last 8760 hours. CBC: Recent Labs    09/09/16 1150  WBC 5.3  NEUTROABS 3,604  HGB 13.1  HCT 39.7  MCV 89.6  PLT 202   Lipid Panel: No  results for input(s): CHOL, HDL, LDLCALC, TRIG, CHOLHDL, LDLDIRECT in the last 8760 hours. Lab Results  Component Value Date   HGBA1C 5.8 (H) 09/09/2016    Assessment/Plan 1. Calcinosis cutis -noted of fingers left greater than right hand, neck also appears this way a bit, no telangiectasias notable - Centromere Antibodies - ANA - Sedimentation rate - C-reactive protein - Anti-Scleroderma Antibody  2. Dysphagia, unspecified type - cont to be careful with small bites and sips, report if progressing, check labs for autoimmune etiology: - Centromere Antibodies - ANA - Sedimentation rate - C-reactive protein - Anti-Scleroderma Antibody  3. Essential hypertension, benign -bp a bit elevated today, seemed anxious, typically at goal, ?pulm htn that could have caused her chf before, too?  4.  Mild cognitive impairment with memory loss -seems fairly stable, is declining physically and is making plans for VA assistance to move to AL level of care which seems like a great idea to me  5. Raynaud's disease without gangrene - now with odd changes of her left greater than right hand and some dysphagia - check labs to r/o CREST and scleroderma: Centromere Antibodies - ANA - Sedimentation rate - C-reactive protein - Anti-Scleroderma Antibody  6. Gout of multiple sites, unspecified cause, unspecified chronicity -has had difficulty historically with this, no recent flares  7. Hyperglycemia - doing well with eating, but not exercising much since her knee surgery - Hemoglobin M0N - Basic metabolic panel - CBC with Differential/Platelet   Labs/tests ordered:   Orders Placed This Encounter  Procedures  . Centromere Antibodies  . ANA  . Sedimentation rate  . C-reactive protein  . Anti-Scleroderma Antibody  . Hemoglobin A1c  . Basic metabolic panel    Order Specific Question:   Has the patient fasted?    Answer:   Yes  . CBC with Differential/Platelet    Next appt:   07/14/2017   Keane Martelli L. Trevar Boehringer, D.O. [facility] [address] [place], [place] [address] Cell Phone (Mon-Fri 8am-5pm):  [phone] On Call:  [phone] & follow prompts after 5pm & weekends Office Phone:  [phone] Office Fax:  [phone]

## 2017-05-16 ENCOUNTER — Other Ambulatory Visit: Payer: Self-pay | Admitting: Internal Medicine

## 2017-05-16 DIAGNOSIS — M25642 Stiffness of left hand, not elsewhere classified: Secondary | ICD-10-CM

## 2017-05-16 LAB — ANA: Anti Nuclear Antibody(ANA): NEGATIVE

## 2017-05-16 LAB — BASIC METABOLIC PANEL WITH GFR
BUN/Creatinine Ratio: 15 (calc) (ref 6–22)
BUN: 25 mg/dL (ref 7–25)
CO2: 29 mmol/L (ref 20–32)
Calcium: 9.8 mg/dL (ref 8.6–10.4)
Chloride: 106 mmol/L (ref 98–110)
Creat: 1.66 mg/dL — ABNORMAL HIGH (ref 0.60–0.88)
Glucose, Bld: 93 mg/dL (ref 65–139)
Potassium: 3.9 mmol/L (ref 3.5–5.3)
Sodium: 143 mmol/L (ref 135–146)

## 2017-05-16 LAB — HEMOGLOBIN A1C
Hgb A1c MFr Bld: 5.9 % of total Hgb — ABNORMAL HIGH (ref <5.7)
Mean Plasma Glucose: 123 (calc)
eAG (mmol/L): 6.8 (calc)

## 2017-05-16 LAB — CBC WITH DIFFERENTIAL/PLATELET
Basophils Absolute: 28 {cells}/uL (ref 0–200)
Basophils Relative: 0.5 %
Eosinophils Absolute: 88 {cells}/uL (ref 15–500)
Eosinophils Relative: 1.6 %
HCT: 40.6 % (ref 35.0–45.0)
Hemoglobin: 13.4 g/dL (ref 11.7–15.5)
Lymphs Abs: 803 {cells}/uL — ABNORMAL LOW (ref 850–3900)
MCH: 30 pg (ref 27.0–33.0)
MCHC: 33 g/dL (ref 32.0–36.0)
MCV: 90.8 fL (ref 80.0–100.0)
MPV: 10.7 fL (ref 7.5–12.5)
Monocytes Relative: 8.9 %
Neutro Abs: 4092 {cells}/uL (ref 1500–7800)
Neutrophils Relative %: 74.4 %
Platelets: 202 Thousand/uL (ref 140–400)
RBC: 4.47 Million/uL (ref 3.80–5.10)
RDW: 14.3 % (ref 11.0–15.0)
Total Lymphocyte: 14.6 %
WBC mixed population: 490 {cells}/uL (ref 200–950)
WBC: 5.5 Thousand/uL (ref 3.8–10.8)

## 2017-05-16 LAB — C-REACTIVE PROTEIN: CRP: 4.2 mg/L (ref <8.0)

## 2017-05-16 LAB — SEDIMENTATION RATE: Sed Rate: 11 mm/h (ref 0–30)

## 2017-05-16 LAB — CENTROMERE ANTIBODIES: Centromere Ab Screen: <1 AI

## 2017-05-16 LAB — ANTI-SCLERODERMA ANTIBODY: Scleroderma (Scl-70) (ENA) Antibody, IgG: <1 AI

## 2017-05-16 NOTE — Progress Notes (Signed)
Rheum referral placed. Shmuel Girgis L. Keyasha Miah, D.O. [facility] [address], [place] [address] Cell Phone (Mon-Fri 8am-5pm):  [phone] On Call:  [phone] & follow prompts after 5pm & weekends Office Phone:  [phone] Office Fax:  [phone]

## 2017-05-17 ENCOUNTER — Other Ambulatory Visit: Payer: Self-pay | Admitting: Internal Medicine

## 2017-05-17 DIAGNOSIS — R600 Localized edema: Secondary | ICD-10-CM

## 2017-05-17 DIAGNOSIS — I10 Essential (primary) hypertension: Secondary | ICD-10-CM

## 2017-05-19 ENCOUNTER — Encounter: Payer: Self-pay | Admitting: *Deleted

## 2017-05-23 ENCOUNTER — Other Ambulatory Visit: Payer: Self-pay | Admitting: Internal Medicine

## 2017-05-23 DIAGNOSIS — M8949 Other hypertrophic osteoarthropathy, multiple sites: Secondary | ICD-10-CM

## 2017-05-23 DIAGNOSIS — M159 Polyosteoarthritis, unspecified: Secondary | ICD-10-CM

## 2017-05-23 DIAGNOSIS — M150 Primary generalized (osteo)arthritis: Secondary | ICD-10-CM

## 2017-05-23 DIAGNOSIS — L942 Calcinosis cutis: Secondary | ICD-10-CM

## 2017-05-28 ENCOUNTER — Telehealth: Payer: Self-pay | Admitting: Internal Medicine

## 2017-05-28 NOTE — Telephone Encounter (Signed)
Patient walked into the office today requesting VA forms to be completed by Dr. Mariea Clonts. Tried to schedule an appointment for patient as the paperwork requires VITALS but patient refused to schedule. Put forms into the MA's bin for review and explained to patient she may receive a call to schedule an appointment as it is our office policy that forms that request vital signs must have an appointment.

## 2017-05-28 NOTE — Telephone Encounter (Signed)
Patient walked in and dropped off Mia [name] for Dr. Mariea Clonts to fill out for her to receive benefits. Placed in Dr. Serafina Mitchell to review, fill out and sign. Patient does not want to schedule an appointment.   To call patient once completed to pick up [phone] or [phone]

## 2017-06-02 ENCOUNTER — Ambulatory Visit (INDEPENDENT_AMBULATORY_CARE_PROVIDER_SITE_OTHER): Payer: Medicare Other | Admitting: Orthopaedic Surgery

## 2017-06-02 ENCOUNTER — Ambulatory Visit (INDEPENDENT_AMBULATORY_CARE_PROVIDER_SITE_OTHER): Payer: Self-pay

## 2017-06-02 ENCOUNTER — Encounter (INDEPENDENT_AMBULATORY_CARE_PROVIDER_SITE_OTHER): Payer: Self-pay | Admitting: Orthopaedic Surgery

## 2017-06-02 VITALS — BP 152/77 | HR 77 | Resp 12 | Ht 67.0 in | Wt 126.0 lb

## 2017-06-02 DIAGNOSIS — M79642 Pain in left hand: Secondary | ICD-10-CM

## 2017-06-02 DIAGNOSIS — M25542 Pain in joints of left hand: Secondary | ICD-10-CM | POA: Diagnosis not present

## 2017-06-02 NOTE — Progress Notes (Signed)
Office Visit Note   Patient: Mia [name]           Date of Birth: August 24, 1925           MRN: [id number] Visit Date: 06/02/2017              Requested by: Gayland Curry, DO [place], [place] [address] PCP: Gayland Curry, DO   Assessment & Plan: Visit Diagnoses:  1. Pain in left hand     Plan: Osteoarthritis left hand involving the PIP joints of ring and little finger with limited range of motion. We will try Voltaren gel and a course of physical therapy. Presently he is limited in her ability to drive her like a better grip  Follow-Up Instructions: Return if symptoms worsen or fail to improve.   Orders:  Orders Placed This Encounter  Procedures  . XR Hand Complete Left  . Ambulatory referral to Physical Therapy   No orders of the defined types were placed in this encounter.     Procedures: No procedures performed   Clinical Data: No additional findings.   Subjective: Chief Complaint  Patient presents with  . Left Hand - Weakness, Edema    Ms. Mia [name] is an 84 y o here today for  Left hand edema and redness x 1 month.  Has not changed since she first noticed this. Hands get blue for no reason. Hx of OA  Mrs. Mia [name] relates waking up one morning about 3 months ago with swelling in the left ring and little finger. Since that time she's had difficulty making a fist which compromises her activities of daily living. She does ambulate with a rolling walker but notes that she's had some difficulty with grip and particularly trying to drive bite and gripping the steering wheel. Denies numbness or tingling.  HPI  Review of Systems   Objective: Vital Signs: BP (!) 152/77   Pulse 77   Resp 12   Ht 5\' 7"  (1.702 m)   Wt 126 lb (57.2 kg)   BMI 19.73 kg/m   Physical Exam  Ortho Exam awake alert and oriented 3. Very pleasant and comfortable emanation of the left hand demonstrates very long fingers. Limited range of motion particularly about the PIP joints of  the little and ring finger. I there was for a little motion across the PIP joint of the little finger from extension no instability no swelling and no erythema. Normal capillary refill. Normal sensibility. No pain about the DIP or MP joint. Does have diffuse boutonniere deformities both hands. PIP joint of left ring finger is also "a little stiff". I could flex about 90 but she was unable to make a full fist even passively. No instability. No redness or erythema. The base of the thumb. No swelling of any of the digits.  Specialty Comments:  No specialty comments available.  Imaging: Xr Hand Complete Left  Result Date: 06/02/2017 Films of the left hand obtained in several projections. There is diffuse and mild decrease in joint space across the PIP joints particularly of the little finger. Metacarpal phalangeal joints appear to be intact. There are degenerative changes at the base of the thumb at the metacarpal carpal joint with minimal subluxation. Obvious fracture. Some cystic changes within the carpus carpal joint intact. No evidence of acute injury or fracture    PMFS History: Patient Active Problem List   Diagnosis Date Noted  . Imbalance 09/16/2016  . At risk for falls 09/16/2016  .  Muscle weakness 09/16/2016  . Raynaud's disease without gangrene 06/10/2016  . Lower leg edema 06/03/2016  . Gout of multiple sites 06/03/2016  . Hereditary and idiopathic peripheral neuropathy 06/03/2016  . History of total knee replacement 10/06/2015  . RBBB 09/06/2015  . Preoperative cardiovascular examination 09/06/2015  . Mild cognitive impairment with memory loss 09/04/2015  . Loss of weight 09/04/2015  . Primary osteoarthritis of right knee 07/15/2014  . Xerosis cutis 07/15/2014  . Superficial burn of groin 11/24/2013  . B12 deficiency 09/23/2013  . Essential hypertension, benign 09/23/2013  . PVC's (premature ventricular contractions) 09/23/2013  . Vitamin D deficiency 09/23/2013  .  Hyperglycemia 09/23/2013  . History of TIAs 08/06/2013  . DOE (dyspnea on exertion) 08/06/2013   Past Medical History:  Diagnosis Date  . Arthritis    oa  . Chronic kidney disease    chronic kidney diseaase follow by primary md  . Diabetes mellitus without complication ([place])    Diet controlled  . Former smoker   . H/O ventricular fibrillation 10 yrs ago dx  . Hypertension   . Prediabetes   . Seasonal allergies   . TIA (transient ischemic attack) none recent    Family History  Problem Relation Age of Onset  . Congestive Heart Failure Mother   . Alcohol abuse Father   . Heart disease Son   . Bipolar disorder Son   . Lung cancer Brother   . Post-traumatic stress disorder Daughter     Past Surgical History:  Procedure Laterality Date  . NM MYOCAR PERF WALL MOTION  01/2008   lexiscan myoview - normal pattern of perfusion in all regions, EF 92%, no significant ischemia demonstated  . TONSILLECTOMY  age 27   and adenoids  . TOTAL KNEE ARTHROPLASTY Right 10/06/2015   Procedure: RIGHT TOTAL KNEE ARTHROPLASTY;  Surgeon: [name], MD;  Location: [facility];  Service: Orthopedics;  Laterality: Right;  . TRANSTHORACIC ECHOCARDIOGRAM  12/2005   mild DUST, EF=>55%; mild mitral annular calcif; mild-mod aortic root calcif   Social History   Occupational History  . 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 Smoking status: Former Smoker    Packs/day: 0.25    Years: 40.00    Pack years: 10.00    Types: Cigarettes    Last attempt to quit: 07/30/2000    Years since quitting: 16.8  . Smokeless tobacco: Never Used  Substance and Sexual Activity  . Alcohol use: Yes    Alcohol/week: 4.2 oz    Types: 7 Glasses of wine per week  . Drug use: No  . Sexual activity: No     Garald Balding, MD   Note - This record has been created using [employer].  Chart creation errors have been sought, but may not always  have been located. Such  creation errors do not reflect on  the standard of medical care.

## 2017-06-05 ENCOUNTER — Other Ambulatory Visit: Payer: Self-pay | Admitting: Internal Medicine

## 2017-06-05 DIAGNOSIS — R600 Localized edema: Secondary | ICD-10-CM

## 2017-06-05 DIAGNOSIS — I10 Essential (primary) hypertension: Secondary | ICD-10-CM

## 2017-06-17 ENCOUNTER — Other Ambulatory Visit: Payer: Self-pay | Admitting: Internal Medicine

## 2017-06-17 DIAGNOSIS — I10 Essential (primary) hypertension: Secondary | ICD-10-CM

## 2017-06-17 DIAGNOSIS — R600 Localized edema: Secondary | ICD-10-CM

## 2017-06-22 ENCOUNTER — Encounter: Payer: Self-pay | Admitting: Internal Medicine

## 2017-06-22 DIAGNOSIS — Z029 Encounter for administrative examinations, unspecified: Secondary | ICD-10-CM

## 2017-06-23 ENCOUNTER — Telehealth (INDEPENDENT_AMBULATORY_CARE_PROVIDER_SITE_OTHER): Payer: Self-pay | Admitting: Orthopaedic Surgery

## 2017-06-23 NOTE — Telephone Encounter (Signed)
Percell Miller from [employer]. Left a voicemail stating that the patient was seen on 06/09/17 for her hand and she requested that she continue [facility] due to not having transportation and it was easier for her.  If you have any additional questions please call me at [phone]

## 2017-06-24 NOTE — Telephone Encounter (Signed)
Forms were faxed to the [facility] as request by the patient.

## 2017-06-24 NOTE — Telephone Encounter (Signed)
Pt wants to go to PT where she lives. Faxed in order.

## 2017-06-24 NOTE — Telephone Encounter (Signed)
Finished form and gave to medical records on 06/23/17.

## 2017-07-02 ENCOUNTER — Telehealth (INDEPENDENT_AMBULATORY_CARE_PROVIDER_SITE_OTHER): Payer: Self-pay | Admitting: Radiology

## 2017-07-02 NOTE — Telephone Encounter (Signed)
Mia [name] with [facility] requests plan of care to be faxed back to [phone].  She states this has been faxed to your office. Thanks.

## 2017-07-02 NOTE — Telephone Encounter (Signed)
Called and gave her correct fax for [place]

## 2017-07-07 ENCOUNTER — Other Ambulatory Visit: Payer: Self-pay | Admitting: Internal Medicine

## 2017-07-07 DIAGNOSIS — R600 Localized edema: Secondary | ICD-10-CM

## 2017-07-07 DIAGNOSIS — I10 Essential (primary) hypertension: Secondary | ICD-10-CM

## 2017-07-14 ENCOUNTER — Ambulatory Visit (INDEPENDENT_AMBULATORY_CARE_PROVIDER_SITE_OTHER): Payer: Medicare Other

## 2017-07-14 VITALS — BP 125/60 | Temp 97.8°F | Ht 67.0 in | Wt 130.0 lb

## 2017-07-14 DIAGNOSIS — R739 Hyperglycemia, unspecified: Secondary | ICD-10-CM | POA: Diagnosis not present

## 2017-07-14 DIAGNOSIS — E2839 Other primary ovarian failure: Secondary | ICD-10-CM | POA: Diagnosis not present

## 2017-07-14 DIAGNOSIS — Z0000 Encounter for general adult medical examination without abnormal findings: Secondary | ICD-10-CM

## 2017-07-14 MED ORDER — ZOSTER VAC RECOMB ADJUVANTED 50 MCG/0.5ML IM SUSR: 0.5000 mL | Freq: Once | INTRAMUSCULAR | 1 refills | Status: AC

## 2017-07-14 NOTE — Patient Instructions (Signed)
Mia [name] , Thank you for taking time to come for your Medicare Wellness Visit. I appreciate your ongoing commitment to your health goals. Please review the following plan we discussed and let me know if I can assist you in the future.   Screening recommendations/referrals: Colonoscopy excluded, you are over age 82 Mammogram excluded, you are over age 77 Bone Density due, referral sent Recommended yearly ophthalmology/optometry visit for glaucoma screening and checkup Recommended yearly dental visit for hygiene and checkup  Vaccinations: Influenza vaccine up to date, due 2019 falls season Pneumococcal vaccine up to date Tdap vaccine up to date, due 07/08/2025 Shingles vaccine due, prescription sent to pharamcy    Advanced directives: in chart  Conditions/risks identified: none  Next appointment: Dr. Mariea Clonts 09/11/2017 @ 10:30a            Tyson Dense, RN 07/17/2018 @ 12:45pm   Preventive Care 65 Years and Older, Female Preventive care refers to lifestyle choices and visits with your health care provider that can promote health and wellness. What does preventive care include?  A yearly physical exam. This is also called an annual well check.  Dental exams once or twice a year.  Routine eye exams. Ask your health care provider how often you should have your eyes checked.  Personal lifestyle choices, including:  Daily care of your teeth and gums.  Regular physical activity.  Eating a healthy diet.  Avoiding tobacco and drug use.  Limiting alcohol use.  Practicing safe sex.  Taking low-dose aspirin every day.  Taking vitamin and mineral supplements as recommended by your health care provider. What happens during an annual well check? The services and screenings done by your health care provider during your annual well check will depend on your age, overall health, lifestyle risk factors, and family history of disease. Counseling  Your health care provider may ask you  questions about your:  Alcohol use.  Tobacco use.  Drug use.  Emotional well-being.  Home and relationship well-being.  Sexual activity.  Eating habits.  History of falls.  Memory and ability to understand (cognition).  Work and work Statistician.  Reproductive health. Screening  You may have the following tests or measurements:  Height, weight, and BMI.  Blood pressure.  Lipid and cholesterol levels. These may be checked every 5 years, or more frequently if you are over 29 years old.  Skin check.  Lung cancer screening. You may have this screening every year starting at age 67 if you have a 30-pack-year history of smoking and currently smoke or have quit within the past 15 years.  Fecal occult blood test (FOBT) of the stool. You may have this test every year starting at age 1.  Flexible sigmoidoscopy or colonoscopy. You may have a sigmoidoscopy every 5 years or a colonoscopy every 10 years starting at age 60.  Hepatitis C blood test.  Hepatitis B blood test.  Sexually transmitted disease (STD) testing.  Diabetes screening. This is done by checking your blood sugar (glucose) after you have not eaten for a while (fasting). You may have this done every 1-3 years.  Bone density scan. This is done to screen for osteoporosis. You may have this done starting at age 80.  Mammogram. This may be done every 1-2 years. Talk to your health care provider about how often you should have regular mammograms. Talk with your health care provider about your test results, treatment options, and if necessary, the need for more tests. Vaccines  Your health care  provider may recommend certain vaccines, such as:  Influenza vaccine. This is recommended every year.  Tetanus, diphtheria, and acellular pertussis (Tdap, Td) vaccine. You may need a Td booster every 10 years.  Zoster vaccine. You may need this after age 57.  Pneumococcal 13-valent conjugate (PCV13) vaccine. One dose is  recommended after age 40.  Pneumococcal polysaccharide (PPSV23) vaccine. One dose is recommended after age 37. Talk to your health care provider about which screenings and vaccines you need and how often you need them. This information is not intended to replace advice given to you by your health care provider. Make sure you discuss any questions you have with your health care provider. Document Released: 06/02/2015 Document Revised: 01/24/2016 Document Reviewed: 03/07/2015 Elsevier Interactive Patient Education  2017 [place] Prevention in the Home Falls can cause injuries. They can happen to people of all ages. There are many things you can do to make your home safe and to help prevent falls. What can I do on the outside of my home?  Regularly fix the edges of walkways and driveways and fix any cracks.  Remove anything that might make you trip as you walk through a door, such as a raised step or threshold.  Trim any bushes or trees on the path to your home.  Use bright outdoor lighting.  Clear any walking paths of anything that might make someone trip, such as rocks or tools.  Regularly check to see if handrails are loose or broken. Make sure that both sides of any steps have handrails.  Any raised decks and porches should have guardrails on the edges.  Have any leaves, snow, or ice cleared regularly.  Use sand or salt on walking paths during winter.  Clean up any spills in your garage right away. This includes oil or grease spills. What can I do in the bathroom?  Use night lights.  Install grab bars by the toilet and in the tub and shower. Do not use towel bars as grab bars.  Use non-skid mats or decals in the tub or shower.  If you need to sit down in the shower, use a plastic, non-slip stool.  Keep the floor dry. Clean up any water that spills on the floor as soon as it happens.  Remove soap buildup in the tub or shower regularly.  Attach bath mats  securely with double-sided non-slip rug tape.  Do not have throw rugs and other things on the floor that can make you trip. What can I do in the bedroom?  Use night lights.  Make sure that you have a light by your bed that is easy to reach.  Do not use any sheets or blankets that are too big for your bed. They should not hang down onto the floor.  Have a firm chair that has side arms. You can use this for support while you get dressed.  Do not have throw rugs and other things on the floor that can make you trip. What can I do in the kitchen?  Clean up any spills right away.  Avoid walking on wet floors.  Keep items that you use a lot in easy-to-reach places.  If you need to reach something above you, use a strong step stool that has a grab bar.  Keep electrical cords out of the way.  Do not use floor polish or wax that makes floors slippery. If you must use wax, use non-skid floor wax.  Do not have throw  rugs and other things on the floor that can make you trip. What can I do with my stairs?  Do not leave any items on the stairs.  Make sure that there are handrails on both sides of the stairs and use them. Fix handrails that are broken or loose. Make sure that handrails are as long as the stairways.  Check any carpeting to make sure that it is firmly attached to the stairs. Fix any carpet that is loose or worn.  Avoid having throw rugs at the top or bottom of the stairs. If you do have throw rugs, attach them to the floor with carpet tape.  Make sure that you have a light switch at the top of the stairs and the bottom of the stairs. If you do not have them, ask someone to add them for you. What else can I do to help prevent falls?  Wear shoes that:  Do not have high heels.  Have rubber bottoms.  Are comfortable and fit you well.  Are closed at the toe. Do not wear sandals.  If you use a stepladder:  Make sure that it is fully opened. Do not climb a closed  stepladder.  Make sure that both sides of the stepladder are locked into place.  Ask someone to hold it for you, if possible.  Clearly mark and make sure that you can see:  Any grab bars or handrails.  First and last steps.  Where the edge of each step is.  Use tools that help you move around (mobility aids) if they are needed. These include:  Canes.  Walkers.  Scooters.  Crutches.  Turn on the lights when you go into a dark area. Replace any light bulbs as soon as they burn out.  Set up your furniture so you have a clear path. Avoid moving your furniture around.  If any of your floors are uneven, fix them.  If there are any pets around you, be aware of where they are.  Review your medicines with your doctor. Some medicines can make you feel dizzy. This can increase your chance of falling. Ask your doctor what other things that you can do to help prevent falls. This information is not intended to replace advice given to you by your health care provider. Make sure you discuss any questions you have with your health care provider. Document Released: 03/02/2009 Document Revised: 10/12/2015 Document Reviewed: 06/10/2014 Elsevier Interactive Patient Education  2017 [employer].

## 2017-07-14 NOTE — Progress Notes (Signed)
Subjective:   Mia [name] is a 84 y.o. female who presents for Medicare Annual (Subsequent) preventive examination.  Last AWV-10/11/2010       Objective:     Vitals: BP 125/60 (BP Location: Left Arm, Patient Position: Sitting)   Temp 97.8 F (36.6 C) (Oral)   Ht 5\' 7"  (1.702 m)   Wt 130 lb (59 kg)   BMI 20.36 kg/m   Body mass index is 20.36 kg/m.  Advanced Directives 07/14/2017 05/15/2017 09/09/2016 07/15/2016 07/11/2016 06/03/2016 01/29/2016  Does Patient Have a Medical Advance Directive? Yes Yes Yes Yes Yes Yes Yes  Type of Paramedic of [place];Living will;Out of facility DNR (pink MOST or yellow form) [place];Living will;Out of facility DNR (pink MOST or yellow form) [place];Living will;Out of facility DNR (pink MOST or yellow form) [place];Living will [place];Living will [place];Living will;Out of facility DNR (pink MOST or yellow form) [place];Living will;Out of facility DNR (pink MOST or yellow form)  Does patient want to make changes to medical advance directive? No - Patient declined No - Patient declined - No - Patient declined - No - Patient declined -  Copy of [place] in Chart? Yes Yes Yes Yes Yes Yes Yes  Pre-existing out of facility DNR order (yellow form or pink MOST form) Yellow form placed in chart (order not valid for inpatient use) Yellow form placed in chart (order not valid for inpatient use) Yellow form placed in chart (order not valid for inpatient use) - - Yellow form placed in chart (order not valid for inpatient use) -    Tobacco Social History   Tobacco Use  Smoking Status Former Smoker  . Packs/day: 0.25  . Years: 40.00  . Pack years: 10.00  . Types: Cigarettes  . Last attempt to quit: 07/30/2000  . Years since quitting: 16.9  Smokeless Tobacco Never Used     Counseling given: Not  Answered   Clinical Intake:  Pre-visit preparation completed: No  Pain : 0-10 Pain Score: 6  Pain Type: Acute pain Pain Location: Hip Pain Orientation: Right, Left Pain Radiating Towards: legs Pain Descriptors / Indicators: Aching Pain Onset: More than a month ago Pain Frequency: Intermittent     Nutritional Risks: None Diabetes: Yes CBG done?: No Did pt. bring in CBG monitor from home?: No  How often do you need to have someone help you when you read instructions, pamphlets, or other written materials from your doctor or pharmacy?: 1 - Never What is the last grade level you completed in school?: bachelors  Interpreter Needed?: No  Information entered by :: Tyson Dense, RN  Past Medical History:  Diagnosis Date  . Arthritis    oa  . Chronic kidney disease    chronic kidney diseaase follow by primary md  . Diabetes mellitus without complication ([place])    Diet controlled  . Former smoker   . H/O ventricular fibrillation 10 yrs ago dx  . Hypertension   . Prediabetes   . Seasonal allergies   . TIA (transient ischemic attack) none recent   Past Surgical History:  Procedure Laterality Date  . NM MYOCAR PERF WALL MOTION  01/2008   lexiscan myoview - normal pattern of perfusion in all regions, EF 92%, no significant ischemia demonstated  . TONSILLECTOMY  age 72   and adenoids  . TOTAL KNEE ARTHROPLASTY Right 10/06/2015   Procedure: RIGHT  TOTAL KNEE ARTHROPLASTY;  Surgeon: [name], MD;  Location: [facility];  Service: Orthopedics;  Laterality: Right;  . TRANSTHORACIC ECHOCARDIOGRAM  12/2005   mild DUST, EF=>55%; mild mitral annular calcif; mild-mod aortic root calcif   Family History  Problem Relation Age of Onset  . Congestive Heart Failure Mother   . Alcohol abuse Father   . Heart disease Son   . Bipolar disorder Son   . Lung cancer Brother   . Post-traumatic stress disorder Daughter    Social History   Socioeconomic History  . Marital status:  Widowed    Spouse name: None  . Number of children: 4  . Years of education: PhD  . [facility] education level: None  Social Needs  . Financial resource strain: Not hard at all  . Food insecurity - worry: Never true  . Food insecurity - inability: Never true  . Transportation needs - medical: No  . Transportation needs - non-medical: No  Occupational History  . 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 Smoking status: Former Smoker    Packs/day: 0.25    Years: 40.00    Pack years: 10.00    Types: Cigarettes    Last attempt to quit: 07/30/2000    Years since quitting: 16.9  . Smokeless tobacco: Never Used  Substance and Sexual Activity  . Alcohol use: Yes    Alcohol/week: 4.2 oz    Types: 7 Glasses of wine per week  . Drug use: No  . Sexual activity: No  Other Topics Concern  . None  Social History Narrative  . None    Outpatient Encounter Medications as of 07/14/2017  Medication Sig  . cholecalciferol (VITAMIN D) 1000 UNITS tablet Take 1,000 Units by mouth daily. D3  . clopidogrel (PLAVIX) 75 MG tablet Take 1 tablet (75 mg total) by mouth daily.  . hydrochlorothiazide (HYDRODIURIL) 12.5 MG tablet TAKE 1 TABLET BY MOUTH EVERY OTHER DAY  . ketorolac (ACULAR) 0.5 % ophthalmic solution Place 1 drop into the right eye 2 (two) times daily.  . metoprolol succinate (TOPROL-XL) 50 MG 24 hr tablet Take 1 tablet (50 mg total) by mouth daily.  . potassium chloride (K-DUR,KLOR-CON) 10 MEQ tablet TAKE 1 TABLET BY MOUTH DAILY  . Zoster Vaccine Adjuvanted [facility]) injection Inject 0.5 mLs into the muscle once for 1 dose.  . [DISCONTINUED] Zoster Vaccine Adjuvanted [facility]) injection Inject 0.5 mLs into the muscle once.   No facility-administered encounter medications on file as of 07/14/2017.     Activities of Daily Living In your present state of health, do you have any difficulty performing the following activities: 07/14/2017  Hearing? N  Vision? N    Difficulty concentrating or making decisions? Y  Walking or climbing stairs? Y  Dressing or bathing? N  Doing errands, shopping? N  Preparing Food and eating ? N  Using the Toilet? N  In the past six months, have you accidently leaked urine? Y  Do you have problems with loss of bowel control? N  Managing your Medications? N  Managing your Finances? N  Housekeeping or managing your Housekeeping? N  Some recent data might be hidden    Patient Care Team: Gayland Curry, DO as PCP - General (Geriatric Medicine) Monna Fam, MD as Consulting Physician (Ophthalmology) Debara Pickett Nadean Corwin, MD as Consulting Physician (Cardiology) [name], MD as Consulting Physician (Orthopedic Surgery)    Assessment:   This is a routine wellness examination  for [place].  Exercise Activities and Dietary recommendations Current Exercise Habits: Home exercise routine;Structured exercise class, Type of exercise: strength training/weights, Time (Minutes): 30, Frequency (Times/Week): 3, Weekly Exercise (Minutes/Week): 90, Intensity: Mild, Exercise limited by: None identified  Goals    None      Fall Risk Fall Risk  07/14/2017 09/09/2016 07/15/2016 07/11/2016 06/03/2016  Falls in the past year? Yes No Yes Yes No  Number falls in past yr: 2 or more - 1 2 or more -  Comment - - - Pt fell down stairs, pt did not lift her foot up far enough and fell. Hit head; 2nd fall pt is unsure how or why she fell, she was walking to her car and down she went.  -  Injury with Fall? No - No Yes -  Risk Factor Category  - - - High Fall Risk -  Risk for fall due to : - - - Impaired balance/gait;Impaired mobility;History of fall(s) -  Follow up - - - Falls prevention discussed -  Comment - - - Pt has had rehab  -   Is the patient's home free of loose throw rugs in walkways, pet beds, electrical cords, etc?   yes      Grab bars in the bathroom? yes      Handrails on the stairs?   yes      Adequate lighting?    yes  Timed Get Up and Go performed: 18 seconds, fall risk  Depression Screen PHQ 2/9 Scores 07/14/2017 09/09/2016 07/11/2016 06/03/2016  PHQ - 2 Score 0 0 0 0     Cognitive Function MMSE - Mini Mental State Exam 07/14/2017 07/11/2016 06/03/2016 07/15/2014  Not completed: - (No Data) - -  Orientation to time 5 - 5 4  Orientation to Place 5 - 5 5  Registration 3 - 3 3  Attention/ Calculation 4 - 3 5  Recall 1 - 1 1  Language- name 2 objects 2 - 2 2  Language- repeat 1 - 1 1  Language- follow 3 step command 3 - 3 2  Language- read & follow direction 1 - 1 1  Write a sentence 1 - 1 1  Copy design 1 - 1 1  Total score 27 - 26 26        Immunization History  Administered Date(s) Administered  . Influenza, High Dose Seasonal PF 01/27/2016  . Influenza,inj,Quad PF,6+ Mos 02/10/2017  . Influenza-Unspecified 02/17/2013, 01/18/2014, 12/31/2014  . Pneumococcal Conjugate-13 07/15/2014  . Pneumococcal Polysaccharide-23 05/20/2010  . Tdap 07/09/2015  . Zoster 05/20/2010    Qualifies for Shingles Vaccine? Yes, educated   Screening Tests Health Maintenance  Topic Date Due  . DEXA SCAN  09/25/1992  . TETANUS/TDAP  07/08/2025  . INFLUENZA VACCINE  Completed  . PNA vac Low Risk Adult  Completed    Cancer Screenings: Lung: Low Dose CT Chest recommended if Age 89-80 years, 30 pack-year currently smoking OR have quit w/in 15years. Patient does qualify. Breast:  Up to date on Mammogram? Yes   Up to date of Bone Density/Dexa? No, ordered Colorectal: up to date  Additional Screenings:  Hepatitis C Screening: declined     Plan:    I have personally reviewed and addressed the Medicare Annual Wellness questionnaire and have noted the following in the patient's chart:  A. Medical and social history B. Use of alcohol, tobacco or illicit drugs  C. Current medications and supplements D. Functional ability and status E.  Nutritional status F.  Physical activity G. Advance  directives H. List of other physicians I.  Hospitalizations, surgeries, and ER visits in previous 12 months J.  Chance to include hearing, vision, cognitive, depression L. Referrals and appointments - none  In addition, I have reviewed and discussed with patient certain preventive protocols, quality metrics, and best practice recommendations. A written personalized care plan for preventive services as well as general preventive health recommendations were provided to patient.  See attached scanned questionnaire for additional information.   Signed,   Tyson Dense, RN Nurse Health Advisor  Patient Concerns: None

## 2017-07-15 LAB — HEMOGLOBIN A1C
Hgb A1c MFr Bld: 6 % of total Hgb — ABNORMAL HIGH (ref <5.7)
Mean Plasma Glucose: 126 (calc)
eAG (mmol/L): 7 (calc)

## 2017-07-17 ENCOUNTER — Other Ambulatory Visit: Payer: Self-pay | Admitting: Internal Medicine

## 2017-08-14 ENCOUNTER — Other Ambulatory Visit: Payer: Medicare Other

## 2017-08-15 ENCOUNTER — Other Ambulatory Visit: Payer: Medicare Other

## 2017-09-11 ENCOUNTER — Ambulatory Visit (INDEPENDENT_AMBULATORY_CARE_PROVIDER_SITE_OTHER): Payer: Medicare Other | Admitting: Internal Medicine

## 2017-09-11 ENCOUNTER — Encounter: Payer: Self-pay | Admitting: Internal Medicine

## 2017-09-11 VITALS — BP 112/60 | HR 54 | Temp 97.4°F | Ht 67.0 in | Wt 128.0 lb

## 2017-09-11 DIAGNOSIS — I7300 Raynaud's syndrome without gangrene: Secondary | ICD-10-CM

## 2017-09-11 DIAGNOSIS — I10 Essential (primary) hypertension: Secondary | ICD-10-CM | POA: Diagnosis not present

## 2017-09-11 DIAGNOSIS — N184 Chronic kidney disease, stage 4 (severe): Secondary | ICD-10-CM | POA: Insufficient documentation

## 2017-09-11 DIAGNOSIS — R739 Hyperglycemia, unspecified: Secondary | ICD-10-CM

## 2017-09-11 DIAGNOSIS — G3184 Mild cognitive impairment, so stated: Secondary | ICD-10-CM

## 2017-09-11 MED ORDER — METOPROLOL SUCCINATE ER 25 MG PO TB24: 25.0000 mg | ORAL_TABLET | Freq: Every day | ORAL | 1 refills | Status: DC

## 2017-09-11 NOTE — Progress Notes (Signed)
Location:  [facility] clinic Provider:  Dezmen Alcock L. Mariea Clonts, D.O., C.M.D.  Code Status: DNR Goals of Care:  Advanced Directives 09/11/2017  Does Patient Have a Medical Advance Directive? Yes  Type of Paramedic of [place];Out of facility DNR (pink MOST or yellow form);Living will  Does patient want to make changes to medical advance directive? No - Patient declined  Copy of [place] in Chart? Yes  Pre-existing out of facility DNR order (yellow form or pink MOST form) Yellow form placed in chart (order not valid for inpatient use)     Chief Complaint  Patient presents with  . Medical Management of Chronic Issues    6mh follow-up    HPI: Patient is a 84y.o. female seen today for medical management of chronic diseases.    She is getting PT on her hand.  I had been suspecting scleroderma due to her Raynaud's and contractures, appearance of fingers.  She is enjoying the hot wax for her hands.  Outside of the hands she is doing pretty well.  She is continuing to function independently with some modifications.  [place] [facility] [place] [facility] [place].    She has no new concerns.    Two visits ago, her eyes were worse in terms of macular, but she reports they were better again at the last visit.  BP is fabulous.  Does get dizzy sometimes.  Happens spontaneously, not just on standing.   Discussed hydration due to this symptom and hctz use.  She has had swelling of ankles.  If she starts drinking water first thing, she does better through the day.  She is using uber a lot.  Has to get out of her facility.  Does not go out late.  There's a subgroup called "go go grandparents."  They do have transportation at her facility--they go shopping twice a week but has to remember to arrange at least a week ahead.  Reviewed hba1c of 6 which is great for her.   She's at the point where she does not want to take anymore medicine.  She does not want her life  prolonged.  She would like to be treated palliatively.  Neuropathy bothers her some at night.  Uses mur and frankensense drops some and if she watches her diet--it seems to be brought on by eating certain red meat, milk products and some seafood.  Says certain fruits may do it like grapes.  Says she's read that a vegetarian diet is best but she can't do that.    Past Medical History:  Diagnosis Date  . Arthritis    oa  . Chronic kidney disease    chronic kidney diseaase follow by primary md  . Diabetes mellitus without complication ([place]    Diet controlled  . Former smoker   . H/O ventricular fibrillation 10 yrs ago dx  . Hypertension   . Prediabetes   . Seasonal allergies   . TIA (transient ischemic attack) none recent    Past Surgical History:  Procedure Laterality Date  . NM MYOCAR PERF WALL MOTION  01/2008   lexiscan myoview - normal pattern of perfusion in all regions, EF 92%, no significant ischemia demonstated  . TONSILLECTOMY  age 89  and adenoids  . TOTAL KNEE ARTHROPLASTY Right 10/06/2015   Procedure: RIGHT TOTAL KNEE ARTHROPLASTY;  Surgeon: CMcarthur Rossetti MD;  Location: [facility];  Service: Orthopedics;  Laterality: Right;  . TRANSTHORACIC ECHOCARDIOGRAM  12/2005  mild DUST, EF=>55%; mild mitral annular calcif; mild-mod aortic root calcif    No Known Allergies  Outpatient Encounter Medications as of 09/11/2017  Medication Sig  . cholecalciferol (VITAMIN D) 1000 UNITS tablet Take 1,000 Units by mouth daily. D3  . clopidogrel (PLAVIX) 75 MG tablet TAKE 1 TABLET(75 MG) BY MOUTH DAILY  . hydrochlorothiazide (HYDRODIURIL) 12.5 MG tablet TAKE 1 TABLET BY MOUTH EVERY OTHER DAY  . ketorolac (ACULAR) 0.5 % ophthalmic solution Place 1 drop into the right eye 2 (two) times daily.  . metoprolol succinate (TOPROL-XL) 50 MG 24 hr tablet Take 1 tablet (50 mg total) by mouth daily.  . potassium chloride (K-DUR,KLOR-CON) 10 MEQ tablet TAKE 1 TABLET BY MOUTH DAILY   No  facility-administered encounter medications on file as of 09/11/2017.     Review of Systems:  Review of Systems  Constitutional: Positive for malaise/fatigue. Negative for chills and fever.  HENT: Positive for hearing loss. Negative for congestion.   Eyes: Negative for blurred vision.  Respiratory: Negative for cough and shortness of breath.   Cardiovascular: Negative for chest pain, palpitations and leg swelling.  Gastrointestinal: Negative for abdominal pain, blood in stool, constipation and melena.  Genitourinary: Negative for dysuria.  Musculoskeletal: Positive for joint pain.  Skin: Negative for itching and rash.  Neurological: Positive for dizziness.  Psychiatric/Behavioral: Negative for depression. The patient is not nervous/anxious.        Admits to some minor memory slippage    Health Maintenance  Topic Date Due  . DEXA SCAN  09/25/1992  . INFLUENZA VACCINE  12/18/2017  . TETANUS/TDAP  07/08/2025  . PNA vac Low Risk Adult  Completed    Physical Exam: Vitals:   09/11/17 1036  BP: 112/60  Pulse: (!) 54  Temp: (!) 97.4 F (36.3 C)  TempSrc: Oral  SpO2: 97%  Weight: 128 lb (58.1 kg)  Height: '5\' 7"'$  (1.702 m)   Body mass index is 20.05 kg/m. Physical Exam  Constitutional: She is oriented to person, place, and time.  Frail female, ambulates with rollator walker  HENT:  Head: Normocephalic and atraumatic.  Eyes:  glasses  Cardiovascular: Normal rate, regular rhythm, normal heart sounds and intact distal pulses.  Pulmonary/Chest: Effort normal and breath sounds normal. No respiratory distress.  Abdominal: Bowel sounds are normal.  Musculoskeletal: Normal range of motion.  Contracture of fingers  Neurological: She is alert and oriented to person, place, and time. No cranial nerve deficit.  Skin: Skin is warm and dry. Capillary refill takes less than 2 seconds.  Psychiatric: She has a normal mood and affect.    Labs reviewed: Basic Metabolic Panel: Recent Labs     05/15/17 1223  NA 143  K 3.9  CL 106  CO2 29  GLUCOSE 93  BUN 25  CREATININE 1.66*  CALCIUM 9.8   Liver Function Tests: No results for input(s): AST, ALT, ALKPHOS, BILITOT, PROT, ALBUMIN in the last 8760 hours. No results for input(s): LIPASE, AMYLASE in the last 8760 hours. No results for input(s): AMMONIA in the last 8760 hours. CBC: Recent Labs    05/15/17 1223  WBC 5.5  NEUTROABS 4,092  HGB 13.4  HCT 40.6  MCV 90.8  PLT 202   Lipid Panel: No results for input(s): CHOL, HDL, LDLCALC, TRIG, CHOLHDL, LDLDIRECT in the last 8760 hours. Lab Results  Component Value Date   HGBA1C 6.0 (H) 07/07/2017    Assessment/Plan 1. Essential hypertension, benign - needs med refill - metoprolol succinate (TOPROL-XL) 25 MG  24 hr tablet; Take 1 tablet (25 mg total) by mouth daily.  Dispense: 90 tablet; Refill: 1 - CBC with Differential/Platelet; Future - COMPLETE METABOLIC PANEL WITH GFR; Future  2. Raynaud's disease without gangrene -ongoing but esr, crp normal and rheum refused to see -doing better with therapy  3. Mild cognitive impairment with memory loss -ongoing, is aware and planning ahead--[facility]ing [facility] hear [facility]  4. CKD (chronic kidney disease) stage 4, GFR 15-29 ml/min (HCC) - has been stable, Avoid nephrotoxic agents like nsaids, dose adjust renally excreted meds, hydrate. - COMPLETE METABOLIC PANEL WITH GFR; Future  5. Hyperglycemia -doing great, can afford to eat some more protein and fat to gain some weight--describes cooking some lovely meals for herself  - Hemoglobin A1c; Future  Labs/tests ordered:   Orders Placed This Encounter  Procedures  . CBC with Differential/Platelet    Standing Status:   Future    Standing Expiration Date:   05/13/2018  . COMPLETE METABOLIC PANEL WITH GFR    Standing Status:   Future    Standing Expiration Date:   05/13/2018  . Hemoglobin A1c    Standing Status:   Future    Standing  Expiration Date:   05/13/2018   Next appt:  02/05/2018  Deshondra Worst L. Samar Venneman, D.O. [facility] [address], [place] [address] Cell Phone (Mon-Fri 8am-5pm):  [phone] On Call:  [phone] & follow prompts after 5pm & weekends Office Phone:  [phone] Office Fax:  [phone]

## 2017-09-11 NOTE — Patient Instructions (Addendum)
Cut down dose of metoprolol to 25mg  daily.  We'll see if you have more energy.  I expect your blood pressure will still be ok.    Also hydrate better because of taking the hydrochlorothiazide.

## 2017-09-18 ENCOUNTER — Telehealth: Payer: Self-pay | Admitting: *Deleted

## 2017-09-18 NOTE — Telephone Encounter (Signed)
Mee Hives with [facility] [phone] Fax:[phone] dropped off medical clearance for patient to participate in a study being conducted at [facility]. The purpose of the study is to learn more about how focus of attention affects balance in order to reduce fall risk in older adults. Balance training program 2x per week for 12 weeks.  In order for patient to participate in study a medical clearance from provider stating that patient is cognitively able to give consent to participate and physically able to perform the tasks.  Form needs to be faxed back to research team at [place] form in Dr. Jackolyn Confer box to review

## 2017-11-11 ENCOUNTER — Other Ambulatory Visit: Payer: Self-pay | Admitting: Internal Medicine

## 2017-11-11 DIAGNOSIS — I10 Essential (primary) hypertension: Secondary | ICD-10-CM

## 2017-11-11 DIAGNOSIS — R600 Localized edema: Secondary | ICD-10-CM

## 2017-11-17 LAB — HM DIABETES EYE EXAM

## 2017-11-18 ENCOUNTER — Encounter: Payer: Self-pay | Admitting: *Deleted

## 2017-12-08 ENCOUNTER — Other Ambulatory Visit: Payer: Self-pay | Admitting: Internal Medicine

## 2017-12-08 DIAGNOSIS — R600 Localized edema: Secondary | ICD-10-CM

## 2017-12-08 DIAGNOSIS — I10 Essential (primary) hypertension: Secondary | ICD-10-CM

## 2018-01-09 ENCOUNTER — Telehealth: Payer: Self-pay | Admitting: *Deleted

## 2018-01-09 NOTE — Telephone Encounter (Signed)
Patient called and left message on Clinical intake stating that her Physical Therapist told her that she needed to get a x-ray of the lower extremities.   I tried calling patient back but could not leave a voicemail due to being full. Will try again.

## 2018-01-15 NOTE — Telephone Encounter (Signed)
Voicemail full, cannot leave message. Will try again later.

## 2018-01-29 ENCOUNTER — Other Ambulatory Visit: Payer: Medicare Other

## 2018-02-02 ENCOUNTER — Other Ambulatory Visit: Payer: Medicare Other

## 2018-02-03 LAB — COMPLETE METABOLIC PANEL WITH GFR
AG Ratio: 1.8 (calc) (ref 1.0–2.5)
ALT: 12 U/L (ref 6–29)
AST: 19 U/L (ref 10–35)
Albumin: 4.2 g/dL (ref 3.6–5.1)
Alkaline phosphatase (APISO): 59 U/L (ref 33–130)
BUN/Creatinine Ratio: 17 (calc) (ref 6–22)
BUN: 25 mg/dL (ref 7–25)
CO2: 30 mmol/L (ref 20–32)
Calcium: 9.9 mg/dL (ref 8.6–10.4)
Chloride: 105 mmol/L (ref 98–110)
Creat: 1.48 mg/dL — ABNORMAL HIGH (ref 0.60–0.88)
GFR, Est African American: 36 mL/min/{1.73_m2} — ABNORMAL LOW (ref >=60)
GFR, Est Non African American: 31 mL/min/{1.73_m2} — ABNORMAL LOW (ref >=60)
Globulin: 2.3 g/dL (calc) (ref 1.9–3.7)
Glucose, Bld: 99 mg/dL (ref 65–99)
Potassium: 4 mmol/L (ref 3.5–5.3)
Sodium: 144 mmol/L (ref 135–146)
Total Bilirubin: 0.4 mg/dL (ref 0.2–1.2)
Total Protein: 6.5 g/dL (ref 6.1–8.1)

## 2018-02-03 LAB — CBC WITH DIFFERENTIAL/PLATELET
Basophils Absolute: 28 cells/uL (ref 0–200)
Basophils Relative: 0.5 %
Eosinophils Absolute: 101 cells/uL (ref 15–500)
Eosinophils Relative: 1.8 %
HCT: 44.6 % (ref 35.0–45.0)
Hemoglobin: 14.8 g/dL (ref 11.7–15.5)
Lymphs Abs: 795 cells/uL — ABNORMAL LOW (ref 850–3900)
MCH: 29.8 pg (ref 27.0–33.0)
MCHC: 33.2 g/dL (ref 32.0–36.0)
MCV: 89.9 fL (ref 80.0–100.0)
MPV: 11 fL (ref 7.5–12.5)
Monocytes Relative: 9.2 %
Neutro Abs: 4161 cells/uL (ref 1500–7800)
Neutrophils Relative %: 74.3 %
Platelets: 219 10*3/uL (ref 140–400)
RBC: 4.96 10*6/uL (ref 3.80–5.10)
RDW: 14.5 % (ref 11.0–15.0)
Total Lymphocyte: 14.2 %
WBC mixed population: 515 cells/uL (ref 200–950)
WBC: 5.6 10*3/uL (ref 3.8–10.8)

## 2018-02-03 LAB — HEMOGLOBIN A1C
Hgb A1c MFr Bld: 6.1 % of total Hgb — ABNORMAL HIGH (ref <5.7)
Mean Plasma Glucose: 128 (calc)
eAG (mmol/L): 7.1 (calc)

## 2018-02-05 ENCOUNTER — Ambulatory Visit: Payer: Medicare Other | Admitting: Internal Medicine

## 2018-02-25 ENCOUNTER — Ambulatory Visit: Payer: Medicare Other | Admitting: Internal Medicine

## 2018-03-04 ENCOUNTER — Ambulatory Visit: Payer: Medicare Other | Admitting: Internal Medicine

## 2018-03-11 ENCOUNTER — Other Ambulatory Visit: Payer: Self-pay | Admitting: Internal Medicine

## 2018-03-12 ENCOUNTER — Encounter: Payer: Self-pay | Admitting: Internal Medicine

## 2018-03-12 ENCOUNTER — Ambulatory Visit (INDEPENDENT_AMBULATORY_CARE_PROVIDER_SITE_OTHER): Payer: Medicare Other | Admitting: Internal Medicine

## 2018-03-12 VITALS — BP 120/80 | HR 68 | Temp 98.2°F | Ht 67.0 in | Wt 124.0 lb

## 2018-03-12 DIAGNOSIS — N184 Chronic kidney disease, stage 4 (severe): Secondary | ICD-10-CM | POA: Diagnosis not present

## 2018-03-12 DIAGNOSIS — M159 Polyosteoarthritis, unspecified: Secondary | ICD-10-CM

## 2018-03-12 DIAGNOSIS — R739 Hyperglycemia, unspecified: Secondary | ICD-10-CM | POA: Diagnosis not present

## 2018-03-12 DIAGNOSIS — M150 Primary generalized (osteo)arthritis: Secondary | ICD-10-CM

## 2018-03-12 DIAGNOSIS — G3184 Mild cognitive impairment, so stated: Secondary | ICD-10-CM | POA: Diagnosis not present

## 2018-03-12 DIAGNOSIS — M8949 Other hypertrophic osteoarthropathy, multiple sites: Secondary | ICD-10-CM

## 2018-03-12 DIAGNOSIS — I10 Essential (primary) hypertension: Secondary | ICD-10-CM

## 2018-03-12 DIAGNOSIS — G609 Hereditary and idiopathic neuropathy, unspecified: Secondary | ICD-10-CM

## 2018-03-12 MED ORDER — GABAPENTIN 100 MG PO CAPS: 100.0000 mg | ORAL_CAPSULE | Freq: Every day | ORAL | 0 refills | Status: DC

## 2018-03-12 NOTE — Progress Notes (Signed)
Location:  [facility] clinic Provider:  Miia Blanks L. Mariea Clonts, D.O., C.M.D.  Code Status: DNR Goals of Care:  Advanced Directives 09/11/2017  Does Patient Have a Medical Advance Directive? Yes  Type of Paramedic of [place];Out of facility DNR (pink MOST or yellow form);Living will  Does patient want to make changes to medical advance directive? No - Patient declined  Copy of [place] in Chart? Yes  Pre-existing out of facility DNR order (yellow form or pink MOST form) Yellow form placed in chart (order not valid for inpatient use)     Chief Complaint  Patient presents with  . Medical Management of Chronic Issues    85mth follow-up    HPI: Patient is a 84 y.o. female seen today for medical management of chronic diseases.    She's here for her checkup and needs her hba1c done.    Hurts head to toes.  Thinks it's her arthritis.  Says there is nothing she can do so she won't complain.  Most pain is on her righthand side and radiates down her right leg.  She just finished a series of exercises with PT which helped.  Uses heat and cold for pain.  Says if pain was unbearable, she would take tylenol.    She's still adjusting to not driving.  Her son brought her.  She going out via uber to "get out of dodge".    Still cooks a lot for herself holding onto her walker with one hand and cooking with the other.  She likes fish, chicken, and likes to have a steak once in a while.    Neuropathy might keep her awake at night.  She has various potions she tries, but they don't work.  Naps the next day to make up for it.  Has never taken gabapentin or lyrica.    Past Medical History:  Diagnosis Date  . Arthritis    oa  . Chronic kidney disease    chronic kidney diseaase follow by primary md  . Diabetes mellitus without complication ([place])    Diet controlled  . Former smoker   . H/O ventricular fibrillation 10 yrs ago dx  . Hypertension   . Prediabetes   .  Seasonal allergies   . TIA (transient ischemic attack) none recent    Past Surgical History:  Procedure Laterality Date  . NM MYOCAR PERF WALL MOTION  01/2008   lexiscan myoview - normal pattern of perfusion in all regions, EF 92%, no significant ischemia demonstated  . TONSILLECTOMY  age 65   and adenoids  . TOTAL KNEE ARTHROPLASTY Right 10/06/2015   Procedure: RIGHT TOTAL KNEE ARTHROPLASTY;  Surgeon: [name], MD;  Location: [facility];  Service: Orthopedics;  Laterality: Right;  . TRANSTHORACIC ECHOCARDIOGRAM  12/2005   mild DUST, EF=>55%; mild mitral annular calcif; mild-mod aortic root calcif    No Known Allergies  Outpatient Encounter Medications as of 03/12/2018  Medication Sig  . cholecalciferol (VITAMIN D) 1000 UNITS tablet Take 1,000 Units by mouth daily. D3  . clopidogrel (PLAVIX) 75 MG tablet TAKE 1 TABLET(75 MG) BY MOUTH DAILY  . hydrochlorothiazide (HYDRODIURIL) 12.5 MG tablet TAKE 1 TABLET BY MOUTH EVERY OTHER DAY  . ketorolac (ACULAR) 0.5 % ophthalmic solution Place 1 drop into the right eye 2 (two) times daily.  . metoprolol succinate (TOPROL-XL) 25 MG 24 hr tablet Take 1 tablet (25 mg total) by mouth daily.  . potassium chloride (K-DUR) 10 MEQ tablet TAKE  1 TABLET BY MOUTH DAILY   No facility-administered encounter medications on file as of 03/12/2018.     Review of Systems:  Review of Systems  Constitutional: Positive for malaise/fatigue. Negative for chills and fever.  HENT: Negative for congestion and hearing loss.   Eyes: Negative for blurred vision.       Reports eyes are stable  Respiratory: Negative for shortness of breath.   Cardiovascular: Positive for leg swelling. Negative for chest pain and palpitations.       Mild pitting edema  Gastrointestinal: Negative for abdominal pain, blood in stool, constipation, diarrhea and melena.  Genitourinary: Negative for dysuria.  Musculoskeletal: Positive for joint pain. Negative for falls.    Neurological: Negative for dizziness and loss of consciousness.  Endo/Heme/Allergies: Does not bruise/bleed easily.  Psychiatric/Behavioral: Positive for memory loss. Negative for depression. The patient is not nervous/anxious and does not have insomnia.     Health Maintenance  Topic Date Due  . DEXA SCAN  09/25/1992  . INFLUENZA VACCINE  12/18/2017  . TETANUS/TDAP  07/08/2025  . PNA vac Low Risk Adult  Completed    Physical Exam: Vitals:   03/12/18 1316  BP: 120/80  Pulse: 68  Temp: 98.2 F (36.8 C)  TempSrc: Oral  Weight: 124 lb (56.2 kg)  Height: 5\' 7"  (1.702 m)   Body mass index is 19.42 kg/m. Physical Exam  Constitutional: She is oriented to person, place, and time. No distress.  HENT:  Head: Normocephalic and atraumatic.  Eyes:  glasses  Cardiovascular: Normal rate, regular rhythm, normal heart sounds and intact distal pulses.  1+ edema  Pulmonary/Chest: Effort normal and breath sounds normal. No respiratory distress.  Abdominal: Soft. Bowel sounds are normal. She exhibits no distension. There is no tenderness.  Musculoskeletal: Normal range of motion.  Ulnar deviation of wrists with poor handgrip  Neurological: She is alert and oriented to person, place, and time.  Some mild short term memory loss  Skin: Skin is warm and dry. Capillary refill takes less than 2 seconds.  Psychiatric: She has a normal mood and affect.    Labs reviewed: Basic Metabolic Panel: Recent Labs    05/15/17 1223 02/02/18 0834  NA 143 144  K 3.9 4.0  CL 106 105  CO2 29 30  GLUCOSE 93 99  BUN 25 25  CREATININE 1.66* 1.48*  CALCIUM 9.8 9.9   Liver Function Tests: Recent Labs    02/02/18 0834  AST 19  ALT 12  BILITOT 0.4  PROT 6.5   No results for input(s): LIPASE, AMYLASE in the last 8760 hours. No results for input(s): AMMONIA in the last 8760 hours. CBC: Recent Labs    05/15/17 1223 02/02/18 0834  WBC 5.5 5.6  NEUTROABS 4,092 4,161  HGB 13.4 14.8  HCT 40.6  44.6  MCV 90.8 89.9  PLT 202 219   Lipid Panel: No results for input(s): CHOL, HDL, LDLCALC, TRIG, CHOLHDL, LDLDIRECT in the last 8760 hours. Lab Results  Component Value Date   HGBA1C 6.1 (H) 02/02/2018    Assessment/Plan 1. Hyperglycemia - ongoing, f/u sugar average - generally eats a healthy diet - Hemoglobin A1c  2. CKD (chronic kidney disease) stage 4, GFR 15-29 ml/min (HCC) - f/u lab -Avoid nephrotoxic agents like nsaids, dose adjust renally excreted meds, hydrate. - COMPLETE METABOLIC PANEL WITH GFR - CBC with Differential/Platelet  3. Mild cognitive impairment with memory loss -ongoing, but managing fairly well in [place], still cooks for herself, manages her minimal  meds  4. Primary osteoarthritis involving multiple joints -reminded that it's ok to take tylenol when her aches and pains are bad enough that they are interfering with her routine  5. Essential hypertension, benign -well controlled, cont hctz and toprol xl  6. Hereditary and idiopathic peripheral neuropathy - gabapentin (NEURONTIN) 100 MG capsule; Take 1 capsule (100 mg total) by mouth at bedtime. As needed for neuropathy  Dispense: 30 capsule; Refill: 0 -she will try this and wants to use prn not scheduled  Labs/tests ordered:   Orders Placed This Encounter  Procedures  . Hemoglobin A1c  . COMPLETE METABOLIC PANEL WITH GFR  . CBC with Differential/Platelet    Next appt:  6 mos for CPE, nonfasting labs same day   Mia [name] L. Jarion Hawthorne, D.O. [facility] [address], [place] [address] Cell Phone (Mon-Fri 8am-5pm):  [phone] On Call:  [phone] & follow prompts after 5pm & weekends Office Phone:  [phone] Office Fax:  [phone]

## 2018-03-13 LAB — CBC WITH DIFFERENTIAL/PLATELET
Basophils Absolute: 20 cells/uL (ref 0–200)
Basophils Relative: 0.4 %
Eosinophils Absolute: 40 cells/uL (ref 15–500)
Eosinophils Relative: 0.8 %
HCT: 44.1 % (ref 35.0–45.0)
Hemoglobin: 14.5 g/dL (ref 11.7–15.5)
Lymphs Abs: 935 cells/uL (ref 850–3900)
MCH: 29.9 pg (ref 27.0–33.0)
MCHC: 32.9 g/dL (ref 32.0–36.0)
MCV: 90.9 fL (ref 80.0–100.0)
MPV: 11.1 fL (ref 7.5–12.5)
Monocytes Relative: 8.7 %
Neutro Abs: 3570 cells/uL (ref 1500–7800)
Neutrophils Relative %: 71.4 %
Platelets: 217 10*3/uL (ref 140–400)
RBC: 4.85 10*6/uL (ref 3.80–5.10)
RDW: 14.7 % (ref 11.0–15.0)
Total Lymphocyte: 18.7 %
WBC mixed population: 435 cells/uL (ref 200–950)
WBC: 5 10*3/uL (ref 3.8–10.8)

## 2018-03-13 LAB — COMPLETE METABOLIC PANEL WITH GFR
AG Ratio: 1.6 (calc) (ref 1.0–2.5)
ALT: 11 U/L (ref 6–29)
AST: 19 U/L (ref 10–35)
Albumin: 4 g/dL (ref 3.6–5.1)
Alkaline phosphatase (APISO): 58 U/L (ref 33–130)
BUN/Creatinine Ratio: 13 (calc) (ref 6–22)
BUN: 21 mg/dL (ref 7–25)
CO2: 29 mmol/L (ref 20–32)
Calcium: 9.9 mg/dL (ref 8.6–10.4)
Chloride: 106 mmol/L (ref 98–110)
Creat: 1.56 mg/dL — ABNORMAL HIGH (ref 0.60–0.88)
GFR, Est African American: 34 mL/min/{1.73_m2} — ABNORMAL LOW (ref >=60)
GFR, Est Non African American: 29 mL/min/{1.73_m2} — ABNORMAL LOW (ref >=60)
Globulin: 2.5 g/dL (calc) (ref 1.9–3.7)
Glucose, Bld: 90 mg/dL (ref 65–99)
Potassium: 4.4 mmol/L (ref 3.5–5.3)
Sodium: 143 mmol/L (ref 135–146)
Total Bilirubin: 0.6 mg/dL (ref 0.2–1.2)
Total Protein: 6.5 g/dL (ref 6.1–8.1)

## 2018-03-13 LAB — HEMOGLOBIN A1C
Hgb A1c MFr Bld: 6.2 % of total Hgb — ABNORMAL HIGH (ref <5.7)
Mean Plasma Glucose: 131 (calc)
eAG (mmol/L): 7.3 (calc)

## 2018-03-15 ENCOUNTER — Other Ambulatory Visit: Payer: Self-pay | Admitting: Internal Medicine

## 2018-03-15 DIAGNOSIS — I10 Essential (primary) hypertension: Secondary | ICD-10-CM

## 2018-03-15 DIAGNOSIS — R600 Localized edema: Secondary | ICD-10-CM

## 2018-03-27 ENCOUNTER — Other Ambulatory Visit: Payer: Self-pay | Admitting: Internal Medicine

## 2018-03-27 DIAGNOSIS — I10 Essential (primary) hypertension: Secondary | ICD-10-CM

## 2018-05-05 ENCOUNTER — Ambulatory Visit: Payer: Medicare Other | Admitting: Internal Medicine

## 2018-06-10 ENCOUNTER — Ambulatory Visit: Payer: Medicare Other | Admitting: Internal Medicine

## 2018-06-22 ENCOUNTER — Ambulatory Visit: Payer: Medicare Other | Admitting: Podiatry

## 2018-07-08 ENCOUNTER — Ambulatory Visit: Payer: Medicare Other | Admitting: Podiatry

## 2018-07-11 ENCOUNTER — Other Ambulatory Visit: Payer: Self-pay | Admitting: Internal Medicine

## 2018-07-11 DIAGNOSIS — R600 Localized edema: Secondary | ICD-10-CM

## 2018-07-11 DIAGNOSIS — I10 Essential (primary) hypertension: Secondary | ICD-10-CM

## 2018-07-15 ENCOUNTER — Ambulatory Visit: Payer: Medicare Other

## 2018-07-15 ENCOUNTER — Encounter: Payer: Medicare Other | Admitting: Podiatry

## 2018-07-15 DIAGNOSIS — M2041 Other hammer toe(s) (acquired), right foot: Secondary | ICD-10-CM

## 2018-07-15 NOTE — Progress Notes (Signed)
This encounter was created in error - please disregard.

## 2018-07-17 ENCOUNTER — Ambulatory Visit: Payer: Self-pay

## 2018-07-17 ENCOUNTER — Encounter: Payer: Self-pay | Admitting: Family

## 2018-08-14 ENCOUNTER — Encounter: Payer: Medicare Other | Admitting: Family

## 2018-09-17 ENCOUNTER — Ambulatory Visit: Payer: Medicare Other | Admitting: Internal Medicine

## 2018-09-30 ENCOUNTER — Ambulatory Visit (INDEPENDENT_AMBULATORY_CARE_PROVIDER_SITE_OTHER): Payer: Medicare Other | Admitting: Family

## 2018-09-30 ENCOUNTER — Other Ambulatory Visit: Payer: Self-pay

## 2018-09-30 DIAGNOSIS — Z0000 Encounter for general adult medical examination without abnormal findings: Secondary | ICD-10-CM | POA: Diagnosis not present

## 2018-09-30 NOTE — Progress Notes (Signed)
   This service is provided via telemedicine  No vital signs collected/recorded due to the encounter was a telemedicine visit.   Location of patient (ex: home, work):  Home   Patient consents to a telephone visit:  Yes   Location of the provider (ex: office, home):  Office   Name of any referring provider: Dr. Hollace Kinnier   Names of all persons participating in the telemedicine service and their role in the encounter:  Ruthell Rummage, Steele, Marlowe Sax, NP, Guadalupe Maple   Time spent on call:  Ruthell Rummage CMA, spent  8  Minutes on phone with patient

## 2018-09-30 NOTE — Progress Notes (Signed)
Subjective:   Mia [name] is a 85 y.o. female who presents for Medicare Annual (Subsequent) preventive examination.  Review of Systems:  Cardiac Risk Factors include: advanced age (>97men, >52 women);hypertension;smoking/ tobacco exposure     Objective:     Vitals: There were no vitals taken for this visit.  There is no height or weight on file to calculate BMI.  Advanced Directives 09/30/2018 09/11/2017 07/14/2017 05/15/2017 09/09/2016 07/15/2016 07/11/2016  Does Patient Have a Medical Advance Directive? Yes Yes Yes Yes Yes Yes Yes  Type of Paramedic of [place];Living will;Out of facility DNR (pink MOST or yellow form) [place];Out of facility DNR (pink MOST or yellow form);Living will [place];Living will;Out of facility DNR (pink MOST or yellow form) [place];Living will;Out of facility DNR (pink MOST or yellow form) [place];Living will;Out of facility DNR (pink MOST or yellow form) [place];Living will [place];Living will  Does patient want to make changes to medical advance directive? No - Patient declined No - Patient declined No - Patient declined No - Patient declined - No - Patient declined -  Copy of [place] in Chart? - Yes Yes Yes Yes Yes Yes  Pre-existing out of facility DNR order (yellow form or pink MOST form) - Yellow form placed in chart (order not valid for inpatient use) Yellow form placed in chart (order not valid for inpatient use) Yellow form placed in chart (order not valid for inpatient use) Yellow form placed in chart (order not valid for inpatient use) - -    Tobacco Social History   Tobacco Use  Smoking Status Former Smoker  . Packs/day: 0.25  . Years: 40.00  . Pack years: 10.00  . Types: Cigarettes  . Last attempt to quit: 07/30/2000  . Years since quitting: 18.1  Smokeless Tobacco Never Used     Counseling given: Not Answered   Clinical Intake:  Pre-visit preparation completed: No  Pain : 0-10 Pain Score: 8  Pain Type: Neuropathic pain Pain Location: Generalized Pain Descriptors / Indicators: Burning Pain Onset: Other (comment)(3 years ) Pain Frequency: Intermittent Pain Relieving Factors: advil  Effect of Pain on Daily Activities: sleep   Pain Relieving Factors: advil   Nutritional Risks: None Diabetes: No  How often do you need to have someone help you when you read instructions, pamphlets, or other written materials from your doctor or pharmacy?: 1 - Never What is the last grade level you completed in school?: Doctor  Interpreter Needed?: No  Information entered by :: Katelynd Blauvelt FNP-c   Past Medical History:  Diagnosis Date  . Arthritis    oa  . Chronic kidney disease    chronic kidney diseaase follow by primary md  . Diabetes mellitus without complication ([place])    Diet controlled  . Former smoker   . H/O ventricular fibrillation 10 yrs ago dx  . Hypertension   . Prediabetes   . Seasonal allergies   . TIA (transient ischemic attack) none recent   Past Surgical History:  Procedure Laterality Date  . NM MYOCAR PERF WALL MOTION  01/2008   lexiscan myoview - normal pattern of perfusion in all regions, EF 92%, no significant ischemia demonstated  . TONSILLECTOMY  age 21   and adenoids  . TOTAL KNEE ARTHROPLASTY Right 10/06/2015   Procedure: RIGHT TOTAL KNEE ARTHROPLASTY;  Surgeon: [name], MD;  Location: [facility];  Service:  Orthopedics;  Laterality: Right;  . TRANSTHORACIC ECHOCARDIOGRAM  12/2005   mild DUST, EF=>55%; mild mitral annular calcif; mild-mod aortic root calcif   Family History  Problem Relation Age of Onset  . Congestive Heart Failure Mother   . Alcohol abuse Father   . Heart disease Son   . Bipolar disorder Son   . Lung cancer Brother   . Post-traumatic stress disorder Daughter    Social History   Socioeconomic  History  . Marital status: Widowed    Spouse name: Not on file  . Number of children: 4  . Years of education: PhD  . Highest education level: Not on file  Occupational History  . 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 Financial resource strain: Not hard at all  . Food insecurity:    Worry: Never true    Inability: Never true  . Transportation needs:    Medical: No    Non-medical: No  Tobacco Use  . Smoking status: Former Smoker    Packs/day: 0.25    Years: 40.00    Pack years: 10.00    Types: Cigarettes    Last attempt to quit: 07/30/2000    Years since quitting: 18.1  . Smokeless tobacco: Never Used  Substance and Sexual Activity  . Alcohol use: Yes    Alcohol/week: 7.0 standard drinks    Types: 7 Glasses of wine per week  . Drug use: No  . Sexual activity: Never  Lifestyle  . Physical activity:    Days per week: 3 days    Minutes per session: 30 min  . Stress: Not at all  Relationships  . Social connections:    Talks on phone: More than three times a week    Gets together: More than three times a week    Attends religious service: More than 4 times per year    Active member of club or organization: No    Attends meetings of clubs or organizations: Never    Relationship status: Widowed  Other Topics Concern  . Not on file  Social History Narrative  . Not on file    Outpatient Encounter Medications as of 09/30/2018  Medication Sig  . Ascorbic Acid (VITAMIN C) 1000 MG tablet Take 1,000 mg by mouth daily.  . cholecalciferol (VITAMIN D) 1000 UNITS tablet Take 1,000 Units by mouth daily. D3  . clopidogrel (PLAVIX) 75 MG tablet TAKE 1 TABLET(75 MG) BY MOUTH DAILY  . gabapentin (NEURONTIN) 100 MG capsule Take 1 capsule (100 mg total) by mouth at bedtime. As needed for neuropathy  . hydrochlorothiazide (HYDRODIURIL) 12.5 MG tablet TAKE 1 TABLET BY MOUTH EVERY OTHER DAY  . ketorolac (ACULAR) 0.5 % ophthalmic solution Place 1  drop into the right eye 2 (two) times daily.  . metoprolol succinate (TOPROL-XL) 25 MG 24 hr tablet TAKE 1 TABLET(25 MG) BY MOUTH DAILY  . potassium chloride (K-DUR) 10 MEQ tablet TAKE 1 TABLET BY MOUTH DAILY   No facility-administered encounter medications on file as of 09/30/2018.     Activities of Daily Living In your present state of health, do you have any difficulty performing the following activities: 09/30/2018  Hearing? N  Vision? N  Difficulty concentrating or making decisions? Y  Comment remebering   Walking or climbing stairs? N  Dressing or bathing? N  Doing errands, shopping? N  Preparing Food and eating ? N  Using the Toilet? N  In the past six months,  have you accidently leaked urine? N  Do you have problems with loss of bowel control? N  Managing your Medications? N  Managing your Finances? N  Housekeeping or managing your Housekeeping? N  Some recent data might be hidden    Patient Care Team: Gayland Curry, DO as PCP - General (Geriatric Medicine) Monna Fam, MD as Consulting Physician (Ophthalmology) Debara Pickett Nadean Corwin, MD as Consulting Physician (Cardiology) [name], MD as Consulting Physician (Orthopedic Surgery)    Assessment:   This is a routine wellness examination for [place].  Exercise Activities and Dietary recommendations Current Exercise Habits: Structured exercise class, Type of exercise: stretching;strength training/weights;walking, Time (Minutes): 30, Frequency (Times/Week): 4, Weekly Exercise (Minutes/Week): 120, Intensity: Moderate, Exercise limited by: None identified  Goals    . Gain weight     Starting 07/11/16, I will attempt to increase my physical activity and I would like to gain some weight.        Fall Risk Fall Risk  09/30/2018 03/12/2018 09/11/2017 07/14/2017 09/09/2016  Falls in the past year? 0 No No Yes No  Number falls in past yr: 0 - - 2 or more -  Comment - - - - -  Injury with Fall? 0 - - No -  Risk  Factor Category  - - - - -  Risk for fall due to : - - - - -  Follow up - - - - -  Comment - - - - -   Is the patient's home free of loose throw rugs in walkways, pet beds, electrical cords, etc?   no      Grab bars in the bathroom? yes      Handrails on the stairs?  No stairs      Adequate lighting?   yes  Depression Screen PHQ 2/9 Scores 09/30/2018 03/12/2018 09/11/2017 07/14/2017  PHQ - 2 Score 0 0 0 0     Cognitive Function MMSE - Mini Mental State Exam 07/14/2017 07/11/2016 06/03/2016 07/15/2014  Not completed: - (No Data) - -  Orientation to time 5 - 5 4  Orientation to Place 5 - 5 5  Registration 3 - 3 3  Attention/ Calculation 4 - 3 5  Recall 1 - 1 1  Language- name 2 objects 2 - 2 2  Language- repeat 1 - 1 1  Language- follow 3 step command 3 - 3 2  Language- read & follow direction 1 - 1 1  Write a sentence 1 - 1 1  Copy design 1 - 1 1  Total score 27 - 26 26     6CIT Screen 09/30/2018  What Year? 0 points  What month? 0 points  What time? 0 points  Count back from 20 0 points  Months in reverse 0 points  Repeat phrase 4 points  Total Score 4    Immunization History  Administered Date(s) Administered  . Influenza, High Dose Seasonal PF 01/27/2016, 02/18/2018  . Influenza,inj,Quad PF,6+ Mos 02/10/2017  . Influenza-Unspecified 02/17/2013, 01/18/2014, 12/31/2014  . Pneumococcal Conjugate-13 07/15/2014  . Pneumococcal Polysaccharide-23 05/20/2010  . Tdap 07/09/2015  . Zoster 05/20/2010    Qualifies for Shingles Vaccine? Up to date   Screening Tests Health Maintenance  Topic Date Due  . DEXA SCAN  09/25/1992  . INFLUENZA VACCINE  12/19/2018  . TETANUS/TDAP  07/08/2025  . PNA vac Low Risk Adult  Completed    Cancer Screenings: Lung: Low Dose CT Chest recommended if Age 85-80 years, 30 pack-year currently  smoking OR have quit w/in 15years. Patient does not qualify. Breast:  Up to date on Mammogram? N/A  Up to date of Bone Density/Dexa?due  Colorectal:  N/A   Additional Screenings: Hepatitis C Screening: low risk    Plan:    - Need a Dexa Scan once COVID-19 restrictions are over.   I have personally reviewed and noted the following in the patient's chart:   . Medical and social history . Use of alcohol, tobacco or illicit drugs  . Current medications and supplements . Functional ability and status . Nutritional status . Physical activity . Advanced directives . List of other physicians . Hospitalizations, surgeries, and ER visits in previous 12 months . Vitals . Screenings to include cognitive, depression, and falls . Referrals and appointments  In addition, I have reviewed and discussed with patient certain preventive protocols, quality metrics, and best practice recommendations. A written personalized care plan for preventive services as well as general preventive health recommendations were provided to patient.   Sandrea Hughs, NP  09/30/2018

## 2018-09-30 NOTE — Patient Instructions (Signed)
Mia [name] , Thank you for taking time to come for your Medicare Wellness Visit. I appreciate your ongoing commitment to your health goals. Please review the following plan we discussed and let me know if I can assist you in the future.   Screening recommendations/referrals: Colonoscopy: N/A  Mammogram: N/A  Bone Density: due once COVID-19 restrictions are over. Recommended yearly ophthalmology/optometry visit for glaucoma screening and checkup Recommended yearly dental visit for hygiene and checkup  Vaccinations: Influenza vaccine: up to date  Pneumococcal vaccine: up to date  Tdap vaccine: Up to date due 2027   Shingles vaccine: up to date    Advanced directives: yes   Conditions/risks identified: Advance age female > 51 years,Hypertension,hx smoking   Next appointment: 1 year   Preventive Care 57 Years and Older, Female Preventive care refers to lifestyle choices and visits with your health care provider that can promote health and wellness. What does preventive care include?  A yearly physical exam. This is also called an annual well check.  Dental exams once or twice a year.  Routine eye exams. Ask your health care provider how often you should have your eyes checked.  Personal lifestyle choices, including:  Daily care of your teeth and gums.  Regular physical activity.  Eating a healthy diet.  Avoiding tobacco and drug use.  Limiting alcohol use.  Practicing safe sex.  Taking low-dose aspirin every day.  Taking vitamin and mineral supplements as recommended by your health care provider. What happens during an annual well check? The services and screenings done by your health care provider during your annual well check will depend on your age, overall health, lifestyle risk factors, and family history of disease. Counseling  Your health care provider may ask you questions about your:  Alcohol use.  Tobacco use.  Drug use.  Emotional well-being.  Home  and relationship well-being.  Sexual activity.  Eating habits.  History of falls.  Memory and ability to understand (cognition).  Work and work Statistician.  Reproductive health. Screening  You may have the following tests or measurements:  Height, weight, and BMI.  Blood pressure.  Lipid and cholesterol levels. These may be checked every 5 years, or more frequently if you are over 24 years old.  Skin check.  Lung cancer screening. You may have this screening every year starting at age 33 if you have a 30-pack-year history of smoking and currently smoke or have quit within the past 15 years.  Fecal occult blood test (FOBT) of the stool. You may have this test every year starting at age 45.  Flexible sigmoidoscopy or colonoscopy. You may have a sigmoidoscopy every 5 years or a colonoscopy every 10 years starting at age 61.  Hepatitis C blood test.  Hepatitis B blood test.  Sexually transmitted disease (STD) testing.  Diabetes screening. This is done by checking your blood sugar (glucose) after you have not eaten for a while (fasting). You may have this done every 1-3 years.  Bone density scan. This is done to screen for osteoporosis. You may have this done starting at age 83.  Mammogram. This may be done every 1-2 years. Talk to your health care provider about how often you should have regular mammograms. Talk with your health care provider about your test results, treatment options, and if necessary, the need for more tests. Vaccines  Your health care provider may recommend certain vaccines, such as:  Influenza vaccine. This is recommended every year.  Tetanus, diphtheria, and acellular  pertussis (Tdap, Td) vaccine. You may need a Td booster every 10 years.  Zoster vaccine. You may need this after age 68.  Pneumococcal 13-valent conjugate (PCV13) vaccine. One dose is recommended after age 66.  Pneumococcal polysaccharide (PPSV23) vaccine. One dose is recommended  after age 61. Talk to your health care provider about which screenings and vaccines you need and how often you need them. This information is not intended to replace advice given to you by your health care provider. Make sure you discuss any questions you have with your health care provider. Document Released: 06/02/2015 Document Revised: 01/24/2016 Document Reviewed: 03/07/2015 Elsevier Interactive Patient Education  2017 [place] Prevention in the Home Falls can cause injuries. They can happen to people of all ages. There are many things you can do to make your home safe and to help prevent falls. What can I do on the outside of my home?  Regularly fix the edges of walkways and driveways and fix any cracks.  Remove anything that might make you trip as you walk through a door, such as a raised step or threshold.  Trim any bushes or trees on the path to your home.  Use bright outdoor lighting.  Clear any walking paths of anything that might make someone trip, such as rocks or tools.  Regularly check to see if handrails are loose or broken. Make sure that both sides of any steps have handrails.  Any raised decks and porches should have guardrails on the edges.  Have any leaves, snow, or ice cleared regularly.  Use sand or salt on walking paths during winter.  Clean up any spills in your garage right away. This includes oil or grease spills. What can I do in the bathroom?  Use night lights.  Install grab bars by the toilet and in the tub and shower. Do not use towel bars as grab bars.  Use non-skid mats or decals in the tub or shower.  If you need to sit down in the shower, use a plastic, non-slip stool.  Keep the floor dry. Clean up any water that spills on the floor as soon as it happens.  Remove soap buildup in the tub or shower regularly.  Attach bath mats securely with double-sided non-slip rug tape.  Do not have throw rugs and other things on the floor  that can make you trip. What can I do in the bedroom?  Use night lights.  Make sure that you have a light by your bed that is easy to reach.  Do not use any sheets or blankets that are too big for your bed. They should not hang down onto the floor.  Have a firm chair that has side arms. You can use this for support while you get dressed.  Do not have throw rugs and other things on the floor that can make you trip. What can I do in the kitchen?  Clean up any spills right away.  Avoid walking on wet floors.  Keep items that you use a lot in easy-to-reach places.  If you need to reach something above you, use a strong step stool that has a grab bar.  Keep electrical cords out of the way.  Do not use floor polish or wax that makes floors slippery. If you must use wax, use non-skid floor wax.  Do not have throw rugs and other things on the floor that can make you trip. What can I do with my stairs?  Do not leave any items on the stairs.  Make sure that there are handrails on both sides of the stairs and use them. Fix handrails that are broken or loose. Make sure that handrails are as long as the stairways.  Check any carpeting to make sure that it is firmly attached to the stairs. Fix any carpet that is loose or worn.  Avoid having throw rugs at the top or bottom of the stairs. If you do have throw rugs, attach them to the floor with carpet tape.  Make sure that you have a light switch at the top of the stairs and the bottom of the stairs. If you do not have them, ask someone to add them for you. What else can I do to help prevent falls?  Wear shoes that:  Do not have high heels.  Have rubber bottoms.  Are comfortable and fit you well.  Are closed at the toe. Do not wear sandals.  If you use a stepladder:  Make sure that it is fully opened. Do not climb a closed stepladder.  Make sure that both sides of the stepladder are locked into place.  Ask someone to hold it  for you, if possible.  Clearly mark and make sure that you can see:  Any grab bars or handrails.  First and last steps.  Where the edge of each step is.  Use tools that help you move around (mobility aids) if they are needed. These include:  Canes.  Walkers.  Scooters.  Crutches.  Turn on the lights when you go into a dark area. Replace any light bulbs as soon as they burn out.  Set up your furniture so you have a clear path. Avoid moving your furniture around.  If any of your floors are uneven, fix them.  If there are any pets around you, be aware of where they are.  Review your medicines with your doctor. Some medicines can make you feel dizzy. This can increase your chance of falling. Ask your doctor what other things that you can do to help prevent falls. This information is not intended to replace advice given to you by your health care provider. Make sure you discuss any questions you have with your health care provider. Document Released: 03/02/2009 Document Revised: 10/12/2015 Document Reviewed: 06/10/2014 Elsevier Interactive Patient Education  2017 [employer].

## 2018-10-05 ENCOUNTER — Encounter: Payer: Medicare Other | Admitting: Internal Medicine

## 2018-10-23 ENCOUNTER — Other Ambulatory Visit: Payer: Self-pay | Admitting: Internal Medicine

## 2018-11-02 ENCOUNTER — Other Ambulatory Visit: Payer: Self-pay | Admitting: Internal Medicine

## 2018-11-02 DIAGNOSIS — R600 Localized edema: Secondary | ICD-10-CM

## 2018-11-02 DIAGNOSIS — I10 Essential (primary) hypertension: Secondary | ICD-10-CM

## 2018-11-04 ENCOUNTER — Other Ambulatory Visit: Payer: Self-pay | Admitting: Internal Medicine

## 2018-11-04 DIAGNOSIS — R600 Localized edema: Secondary | ICD-10-CM

## 2018-11-04 DIAGNOSIS — I10 Essential (primary) hypertension: Secondary | ICD-10-CM

## 2018-11-11 ENCOUNTER — Other Ambulatory Visit: Payer: Self-pay

## 2018-11-11 ENCOUNTER — Encounter: Payer: Self-pay | Admitting: Podiatry

## 2018-11-11 ENCOUNTER — Ambulatory Visit (INDEPENDENT_AMBULATORY_CARE_PROVIDER_SITE_OTHER): Payer: Medicare Other | Admitting: Podiatry

## 2018-11-11 VITALS — BP 168/96 | HR 84 | Temp 97.3°F

## 2018-11-11 DIAGNOSIS — M79676 Pain in unspecified toe(s): Secondary | ICD-10-CM | POA: Diagnosis not present

## 2018-11-11 DIAGNOSIS — B351 Tinea unguium: Secondary | ICD-10-CM

## 2018-11-11 DIAGNOSIS — L989 Disorder of the skin and subcutaneous tissue, unspecified: Secondary | ICD-10-CM

## 2018-11-11 NOTE — Patient Instructions (Addendum)
Corns and Calluses Corns are small areas of thickened skin that occur on the top, sides, or tip of a toe. They contain a cone-shaped core with a point that can press on a nerve below. This causes pain.  Calluses are areas of thickened skin that can occur anywhere on the body, including the hands, fingers, palms, soles of the feet, and heels. Calluses are usually larger than corns. What are the causes? Corns and calluses are caused by rubbing (friction) or pressure, such as from shoes that are too tight or do not fit properly. What increases the risk? Corns are more likely to develop in people who have misshapen toes (toe deformities), such as hammer toes. Calluses can occur with friction to any area of the skin. They are more likely to develop in people who:  Work with their hands.  Wear shoes that fit poorly, are too tight, or are high-heeled.  Have toe deformities. What are the signs or symptoms? Symptoms of a corn or callus include:  A hard growth on the skin.  Pain or tenderness under the skin.  Redness and swelling.  Increased discomfort while wearing tight-fitting shoes, if your feet are affected. If a corn or callus becomes infected, symptoms may include:  Redness and swelling that gets worse.  Pain.  Fluid, blood, or pus draining from the corn or callus. How is this diagnosed? Corns and calluses may be diagnosed based on your symptoms, your medical history, and a physical exam. How is this treated? Treatment for corns and calluses may include:  Removing the cause of the friction or pressure. This may involve: ? Changing your shoes. ? Wearing shoe inserts (orthotics) or other protective layers in your shoes, such as a corn pad. ? Wearing gloves.  Applying medicine to the skin (topical medicine) to help soften skin in the hardened, thickened areas.  Removing layers of dead skin with a file to reduce the size of the corn or callus.  Removing the corn or callus with a  scalpel or laser.  Taking antibiotic medicines, if your corn or callus is infected.  Having surgery, if a toe deformity is the cause. Follow these instructions at home:   Take over-the-counter and prescription medicines only as told by your health care provider.  If you were prescribed an antibiotic, take it as told by your health care provider. Do not stop taking it even if your condition starts to improve.  Wear shoes that fit well. Avoid wearing high-heeled shoes and shoes that are too tight or too loose.  Wear any padding, protective layers, gloves, or orthotics as told by your health care provider.  Soak your hands or feet and then use a file or pumice stone to soften your corn or callus. Do this as told by your health care provider.  Check your corn or callus every day for symptoms of infection. Contact a health care provider if you:  Notice that your symptoms do not improve with treatment.  Have redness or swelling that gets worse.  Notice that your corn or callus becomes painful.  Have fluid, blood, or pus coming from your corn or callus.  Have new symptoms. Summary  Corns are small areas of thickened skin that occur on the top, sides, or tip of a toe.  Calluses are areas of thickened skin that can occur anywhere on the body, including the hands, fingers, palms, and soles of the feet. Calluses are usually larger than corns.  Corns and calluses are caused by   rubbing (friction) or pressure, such as from shoes that are too tight or do not fit properly.  Treatment may include wearing any padding, protective layers, gloves, or orthotics as told by your health care provider. This information is not intended to replace advice given to you by your health care provider. Make sure you discuss any questions you have with your health care provider. Document Released: 02/10/2004 Document Revised: 03/19/2017 Document Reviewed: 03/19/2017 Elsevier Interactive Patient Education   2019 [employer]  Onychomycosis/Fungal Toenails  WHAT IS IT? An infection that lies within the keratin of your nail plate that is caused by a fungus.  WHY ME? Fungal infections affect all ages, sexes, races, and creeds.  There may be many factors that predispose you to a fungal infection such as age, coexisting medical conditions such as diabetes, or an autoimmune disease; stress, medications, fatigue, genetics, etc.  Bottom line: fungus thrives in a warm, moist environment and your shoes offer such a location.  IS IT CONTAGIOUS? Theoretically, yes.  You do not want to share shoes, nail clippers or files with someone who has fungal toenails.  Walking around barefoot in the same room or sleeping in the same bed is unlikely to transfer the organism.  It is important to realize, however, that fungus can spread easily from one nail to the next on the same foot.  HOW DO WE TREAT THIS?  There are several ways to treat this condition.  Treatment may depend on many factors such as age, medications, pregnancy, liver and kidney conditions, etc.  It is best to ask your doctor which options are available to you.  1. No treatment.   Unlike many other medical concerns, you can live with this condition.  However for many people this can be a painful condition and may lead to ingrown toenails or a bacterial infection.  It is recommended that you keep the nails cut short to help reduce the amount of fungal nail. 2. Topical treatment.  These range from herbal remedies to prescription strength nail lacquers.  About 40-50% effective, topicals require twice daily application for approximately 9 to 12 months or until an entirely new nail has grown out.  The most effective topicals are medical grade medications available through physicians offices. 3. Oral antifungal medications.  With an 80-90% cure rate, the most common oral medication requires 3 to 4 months of therapy and stays in your system for a year as the new nail  grows out.  Oral antifungal medications do require blood work to make sure it is a safe drug for you.  A liver function panel will be performed prior to starting the medication and after the first month of treatment.  It is important to have the blood work performed to avoid any harmful side effects.  In general, this medication safe but blood work is required. 4. Laser Therapy.  This treatment is performed by applying a specialized laser to the affected nail plate.  This therapy is noninvasive, fast, and non-painful.  It is not covered by insurance and is therefore, out of pocket.  The results have been very good with a 80-95% cure rate.  The [facility] is the only practice in the area to offer this therapy. 5. Permanent Nail Avulsion.  Removing the entire nail so that a new nail will not grow back. 

## 2018-11-16 NOTE — Progress Notes (Signed)
    Subjective: Patient is a 85 y.o. female presenting to the office today with a chief complaint of painful callus lesions noted to the bilateral feet that have been symptomatic for the past several months. Walking and bearing weight increases the pain. She has not done anything at home for treatment.  Patient also complains of elongated, thickened nails that cause pain while ambulating in shoes. She is unable to trim her own nails. She states she is currently taking Plavix. Patient presents today for further treatment and evaluation.  Past Medical History:  Diagnosis Date  . Arthritis    oa  . Chronic kidney disease    chronic kidney diseaase follow by primary md  . Diabetes mellitus without complication ([place])    Diet controlled  . Former smoker   . H/O ventricular fibrillation 10 yrs ago dx  . Hypertension   . Prediabetes   . Seasonal allergies   . TIA (transient ischemic attack) none recent    Objective:  Physical Exam General: Alert and oriented x3 in no acute distress  Dermatology: Hyperkeratotic lesions present on the bilateral feet x 6. Pain on palpation with a central nucleated core noted. Skin is warm, dry and supple bilateral lower extremities. Negative for open lesions or macerations. Nails are tender, long, thickened and dystrophic with subungual debris, consistent with onychomycosis, 1-5 bilateral. No signs of infection noted.  Vascular: Palpable pedal pulses bilaterally. No edema or erythema noted. Capillary refill within normal limits.  Neurological: Epicritic and protective threshold diminished bilaterally.   Musculoskeletal Exam: Pain on palpation at the keratotic lesion noted. Range of motion within normal limits bilateral. Muscle strength 5/5 in all groups bilateral.  Assessment: 1. Onychodystrophic nails 1-5 bilateral with hyperkeratosis of nails.  2. Onychomycosis of nail due to dermatophyte bilateral 3. Pre-ulcerative callus lesions noted to the bilateral  feet x 6   Plan of Care:  1. Patient evaluated. 2. Excisional debridement of keratoic lesion using a chisel blade was performed without incident.  3. Dressed with light dressing. 4. Mechanical debridement of nails 1-5 bilaterally performed using a nail nipper. Filed with dremel without incident.  5. Patient is to return to the clinic in 3 months.   Edrick Kins, DPM [facility]  Dr. Edrick Kins, [address]                                        [place], [place] [address]                Office [phone]  Fax [phone]

## 2018-11-30 ENCOUNTER — Encounter: Payer: Self-pay | Admitting: Internal Medicine

## 2018-11-30 ENCOUNTER — Other Ambulatory Visit: Payer: Self-pay

## 2018-11-30 ENCOUNTER — Ambulatory Visit (INDEPENDENT_AMBULATORY_CARE_PROVIDER_SITE_OTHER): Payer: Medicare Other | Admitting: Internal Medicine

## 2018-11-30 VITALS — BP 128/70 | HR 66 | Temp 98.1°F | Ht 67.0 in | Wt 123.0 lb

## 2018-11-30 DIAGNOSIS — I499 Cardiac arrhythmia, unspecified: Secondary | ICD-10-CM

## 2018-11-30 DIAGNOSIS — Z96651 Presence of right artificial knee joint: Secondary | ICD-10-CM

## 2018-11-30 DIAGNOSIS — M150 Primary generalized (osteo)arthritis: Secondary | ICD-10-CM

## 2018-11-30 DIAGNOSIS — N184 Chronic kidney disease, stage 4 (severe): Secondary | ICD-10-CM

## 2018-11-30 DIAGNOSIS — I10 Essential (primary) hypertension: Secondary | ICD-10-CM | POA: Diagnosis not present

## 2018-11-30 DIAGNOSIS — M8949 Other hypertrophic osteoarthropathy, multiple sites: Secondary | ICD-10-CM

## 2018-11-30 DIAGNOSIS — R739 Hyperglycemia, unspecified: Secondary | ICD-10-CM

## 2018-11-30 DIAGNOSIS — M159 Polyosteoarthritis, unspecified: Secondary | ICD-10-CM

## 2018-11-30 DIAGNOSIS — Z7189 Other specified counseling: Secondary | ICD-10-CM

## 2018-11-30 NOTE — Progress Notes (Signed)
Location:  [facility]  Provider:   Code Status:  Goals of Care:  Advanced Directives 09/30/2018  Does Patient Have a Medical Advance Directive? Yes  Type of Paramedic of [place];Living will;Out of facility DNR (pink MOST or yellow form)  Does patient want to make changes to medical advance directive? No - Patient declined  Copy of [place] in Chart? -  Pre-existing out of facility DNR order (yellow form or pink MOST form) -     Chief Complaint  Patient presents with  . Annual Exam    CPE    HPI: Patient is a 85 y.o. female seen today for medical management of chronic diseases.    Patient complains of some mild depression and anxiety due to covid 19 and the restrictions in place with visitors at her living facility. These restrictions have limited her contact with her family, but she states she has many phone calls throughout the day to keep her social. She is also going to [facility] in a few weeks with family.   One month ago, the patient noticed some increases in her BP. She claims she increased her metoprolol for a few days and them resumed her normal dose. She is compliant with taking her BP meds daily and follows a low sodium diet.   Patient complains of right knee pain. She had a total knee arthroplasty with Dr. Zollie Beckers 3 years ago. The pain is described as painful at the beginning     Past Medical History:  Diagnosis Date  . Arthritis    oa  . Chronic kidney disease    chronic kidney diseaase follow by primary md  . Diabetes mellitus without complication ([place])    Diet controlled  . Former smoker   . H/O ventricular fibrillation 10 yrs ago dx  . Hypertension   . Prediabetes   . Seasonal allergies   . TIA (transient ischemic attack) none recent    Past Surgical History:  Procedure Laterality Date  . NM MYOCAR PERF WALL MOTION  01/2008   lexiscan myoview - normal pattern of perfusion in all regions, EF 92%,  no significant ischemia demonstated  . TONSILLECTOMY  age 48   and adenoids  . TOTAL KNEE ARTHROPLASTY Right 10/06/2015   Procedure: RIGHT TOTAL KNEE ARTHROPLASTY;  Surgeon: [name], MD;  Location: [facility];  Service: Orthopedics;  Laterality: Right;  . TRANSTHORACIC ECHOCARDIOGRAM  12/2005   mild DUST, EF=>55%; mild mitral annular calcif; mild-mod aortic root calcif    No Known Allergies  Outpatient Encounter Medications as of 11/30/2018  Medication Sig  . Ascorbic Acid (VITAMIN C) 1000 MG tablet Take 1,000 mg by mouth daily.  . cholecalciferol (VITAMIN D) 1000 UNITS tablet Take 1,000 Units by mouth daily. D3  . clopidogrel (PLAVIX) 75 MG tablet TAKE 1 TABLET(75 MG) BY MOUTH DAILY  . gabapentin (NEURONTIN) 100 MG capsule Take 1 capsule (100 mg total) by mouth at bedtime. As needed for neuropathy  . hydrochlorothiazide (HYDRODIURIL) 12.5 MG tablet TAKE 1 TABLET BY MOUTH EVERY OTHER DAY  . ketorolac (ACULAR) 0.5 % ophthalmic solution Place 1 drop into the right eye 2 (two) times daily.  . metoprolol succinate (TOPROL-XL) 25 MG 24 hr tablet TAKE 1 TABLET(25 MG) BY MOUTH DAILY  . potassium chloride (K-DUR) 10 MEQ tablet TAKE 1 TABLET BY MOUTH DAILY   No facility-administered encounter medications on file as of 11/30/2018.     Review of Systems:  Review  of Systems  Constitutional: Negative.   Cardiovascular: Positive for palpitations and leg swelling. Negative for chest pain.  Gastrointestinal: Negative.   Musculoskeletal: Positive for arthralgias. Negative for back pain, joint swelling and myalgias.  Skin: Negative.   Neurological: Positive for dizziness. Negative for syncope and weakness.  Psychiatric/Behavioral: The patient is nervous/anxious.        Depressed mood at times    Health Maintenance  Topic Date Due  . DEXA SCAN  09/25/1992  . INFLUENZA VACCINE  12/19/2018  . TETANUS/TDAP  07/08/2025  . PNA vac Low Risk Adult  Completed    Physical Exam: Vitals:    11/30/18 1325  BP: 128/70  Pulse: 66  Temp: 98.1 F (36.7 C)  TempSrc: Oral  SpO2: 95%  Weight: 123 lb (55.8 kg)  Height: 5\' 7"  (1.702 m)   Body mass index is 19.26 kg/m. Physical Exam Constitutional:      General: She is not in acute distress.    Appearance: Normal appearance. She is not ill-appearing.  HENT:     Head: Normocephalic.     Mouth/Throat:     Mouth: Mucous membranes are dry.  Cardiovascular:     Rate and Rhythm: Normal rate. Rhythm irregular.     Pulses: Normal pulses.     Heart sounds: No murmur.  Pulmonary:     Effort: Pulmonary effort is normal.     Breath sounds: Normal breath sounds. No wheezing.  Abdominal:     General: Abdomen is flat.     Palpations: Abdomen is soft.  Musculoskeletal: Normal range of motion.        General: Swelling present.     Right lower leg: Edema present.     Left lower leg: Edema present.     Comments: No pain during palpation of right hip and knee  Neurological:     General: No focal deficit present.     Mental Status: She is alert and oriented to person, place, and time.  Psychiatric:        Mood and Affect: Mood normal.        Behavior: Behavior normal.     Labs reviewed: Basic Metabolic Panel: Recent Labs    02/02/18 0834 03/12/18 1417  NA 144 143  K 4.0 4.4  CL 105 106  CO2 30 29  GLUCOSE 99 90  BUN 25 21  CREATININE 1.48* 1.56*  CALCIUM 9.9 9.9   Liver Function Tests: Recent Labs    02/02/18 0834 03/12/18 1417  AST 19 19  ALT 12 11  BILITOT 0.4 0.6  PROT 6.5 6.5   No results for input(s): LIPASE, AMYLASE in the last 8760 hours. No results for input(s): AMMONIA in the last 8760 hours. CBC: Recent Labs    02/02/18 0834 03/12/18 1417  WBC 5.6 5.0  NEUTROABS 4,161 3,570  HGB 14.8 14.5  HCT 44.6 44.1  MCV 89.9 90.9  PLT 219 217   Lipid Panel: No results for input(s): CHOL, HDL, LDLCALC, TRIG, CHOLHDL, LDLDIRECT in the last 8760 hours. Lab Results  Component Value Date   HGBA1C 6.2 (H)  03/12/2018    Procedures since last visit: No results found.  Assessment/Plan 1. CKD (chronic kidney disease) stage 4, GFR 15-29 ml/min (HCC) - CMP with GRF -CBC with differential panel - avoid nephrotoxic medications like NSAIDS, adjust medications to renal safe dose, encourage hydration  2. Hyperglycemia - HGB A1C - Continue healthy diet that limits sugar and carbs  3. Mild cognitive impairment with memory  loss - stable, no signs of progression - Level of care has not increased, she is able to manage her medications and require minimal assistance with ADL's  4. Primary osteoarthirtis involving multiple joints - right hip and knee pain not constant or progressing - recommend tylenol for mild joint pain or before extended activity  5. Essential hypertension, benign - BP well controlled with hctz and toprol xl  - reminded to call PCP office before changing BP medications on her own  6. Hereditary and idiopathic peripheral neuropathy  - spontaneously resolved - patient is not taking this medication PRN  7. Sinus arythmia - EKG normal  - past history of PVC's - educated patient when to notify PCP             Labs/tests ordered:  Fasting labs: CBC with differential/platelets, CMP with GFR, HGB A1C Next appt:  Follow up in November 2020

## 2018-12-03 NOTE — Addendum Note (Signed)
Addended by: Despina Hidden on: 12/03/2018 09:35 AM   Modules accepted: Orders

## 2018-12-04 ENCOUNTER — Other Ambulatory Visit: Payer: Self-pay

## 2018-12-04 ENCOUNTER — Other Ambulatory Visit: Payer: Medicare Other

## 2018-12-04 DIAGNOSIS — I10 Essential (primary) hypertension: Secondary | ICD-10-CM

## 2018-12-04 DIAGNOSIS — R739 Hyperglycemia, unspecified: Secondary | ICD-10-CM

## 2018-12-05 LAB — COMPLETE METABOLIC PANEL WITH GFR
AG Ratio: 1.5 (calc) (ref 1.0–2.5)
ALT: 12 U/L (ref 6–29)
AST: 21 U/L (ref 10–35)
Albumin: 4 g/dL (ref 3.6–5.1)
Alkaline phosphatase (APISO): 49 U/L (ref 37–153)
BUN/Creatinine Ratio: 16 (calc) (ref 6–22)
BUN: 27 mg/dL — ABNORMAL HIGH (ref 7–25)
CO2: 29 mmol/L (ref 20–32)
Calcium: 9.8 mg/dL (ref 8.6–10.4)
Chloride: 104 mmol/L (ref 98–110)
Creat: 1.71 mg/dL — ABNORMAL HIGH (ref 0.60–0.88)
GFR, Est African American: 30 mL/min/{1.73_m2} — ABNORMAL LOW (ref >=60)
GFR, Est Non African American: 26 mL/min/{1.73_m2} — ABNORMAL LOW (ref >=60)
Globulin: 2.6 g/dL (calc) (ref 1.9–3.7)
Glucose, Bld: 78 mg/dL (ref 65–99)
Potassium: 3.9 mmol/L (ref 3.5–5.3)
Sodium: 143 mmol/L (ref 135–146)
Total Bilirubin: 0.6 mg/dL (ref 0.2–1.2)
Total Protein: 6.6 g/dL (ref 6.1–8.1)

## 2018-12-05 LAB — CBC WITH DIFFERENTIAL/PLATELET
Absolute Monocytes: 502 cells/uL (ref 200–950)
Basophils Absolute: 19 cells/uL (ref 0–200)
Basophils Relative: 0.3 %
Eosinophils Absolute: 99 cells/uL (ref 15–500)
Eosinophils Relative: 1.6 %
HCT: 45.8 % — ABNORMAL HIGH (ref 35.0–45.0)
Hemoglobin: 15.1 g/dL (ref 11.7–15.5)
Lymphs Abs: 874 cells/uL (ref 850–3900)
MCH: 30.6 pg (ref 27.0–33.0)
MCHC: 33 g/dL (ref 32.0–36.0)
MCV: 92.7 fL (ref 80.0–100.0)
MPV: 11 fL (ref 7.5–12.5)
Monocytes Relative: 8.1 %
Neutro Abs: 4706 cells/uL (ref 1500–7800)
Neutrophils Relative %: 75.9 %
Platelets: 194 10*3/uL (ref 140–400)
RBC: 4.94 10*6/uL (ref 3.80–5.10)
RDW: 13.9 % (ref 11.0–15.0)
Total Lymphocyte: 14.1 %
WBC: 6.2 10*3/uL (ref 3.8–10.8)

## 2018-12-05 LAB — HEMOGLOBIN A1C
Hgb A1c MFr Bld: 6.1 % of total Hgb — ABNORMAL HIGH (ref <5.7)
Mean Plasma Glucose: 128 (calc)
eAG (mmol/L): 7.1 (calc)

## 2019-01-05 ENCOUNTER — Emergency Department (HOSPITAL_COMMUNITY)
Admission: EM | Admit: 2019-01-05 | Discharge: 2019-01-06 | Disposition: A | Discharge status: Home / Self Care | Payer: Medicare Other | Attending: Emergency Medicine | Admitting: Emergency Medicine

## 2019-01-05 ENCOUNTER — Other Ambulatory Visit: Payer: Self-pay

## 2019-01-05 ENCOUNTER — Emergency Department (HOSPITAL_COMMUNITY): Payer: Medicare Other

## 2019-01-05 ENCOUNTER — Encounter (HOSPITAL_COMMUNITY): Payer: Self-pay | Admitting: Emergency Medicine

## 2019-01-05 DIAGNOSIS — S0990XA Unspecified injury of head, initial encounter: Secondary | ICD-10-CM

## 2019-01-05 DIAGNOSIS — E1122 Type 2 diabetes mellitus with diabetic chronic kidney disease: Secondary | ICD-10-CM | POA: Diagnosis not present

## 2019-01-05 DIAGNOSIS — N184 Chronic kidney disease, stage 4 (severe): Secondary | ICD-10-CM | POA: Insufficient documentation

## 2019-01-05 DIAGNOSIS — S0101XA Laceration without foreign body of scalp, initial encounter: Secondary | ICD-10-CM

## 2019-01-05 DIAGNOSIS — Z87891 Personal history of nicotine dependence: Secondary | ICD-10-CM | POA: Diagnosis not present

## 2019-01-05 DIAGNOSIS — W0110XA Fall on same level from slipping, tripping and stumbling with subsequent striking against unspecified object, initial encounter: Secondary | ICD-10-CM | POA: Insufficient documentation

## 2019-01-05 DIAGNOSIS — Y999 Unspecified external cause status: Secondary | ICD-10-CM | POA: Diagnosis not present

## 2019-01-05 DIAGNOSIS — S0181XA Laceration without foreign body of other part of head, initial encounter: Secondary | ICD-10-CM | POA: Insufficient documentation

## 2019-01-05 DIAGNOSIS — W19XXXA Unspecified fall, initial encounter: Secondary | ICD-10-CM

## 2019-01-05 DIAGNOSIS — I129 Hypertensive chronic kidney disease with stage 1 through stage 4 chronic kidney disease, or unspecified chronic kidney disease: Secondary | ICD-10-CM | POA: Insufficient documentation

## 2019-01-05 DIAGNOSIS — Y9301 Activity, walking, marching and hiking: Secondary | ICD-10-CM | POA: Insufficient documentation

## 2019-01-05 DIAGNOSIS — Y92121 Bathroom in nursing home as the place of occurrence of the external cause: Secondary | ICD-10-CM | POA: Insufficient documentation

## 2019-01-05 DIAGNOSIS — Z8673 Personal history of transient ischemic attack (TIA), and cerebral infarction without residual deficits: Secondary | ICD-10-CM | POA: Insufficient documentation

## 2019-01-05 IMAGING — CT CT HEAD WITHOUT CONTRAST
3 series · 15 of 47 positions shown, 18 images · non-contrast
Comparison: CT [DATE]

CLINICAL DATA: Fell from bed, right supraorbital hematoma and
puncture wound

EXAM:
CT HEAD WITHOUT CONTRAST
TECHNIQUE: Contiguous axial images were obtained from the base of the skull
through the vertex without intravenous contrast.

[Series 3: head 5.0 h30s · axial · 0.43mm/px · z∈[-88,+42]mm · 9 of 32 slices shown, 12 images]
[im 3/32  brain]
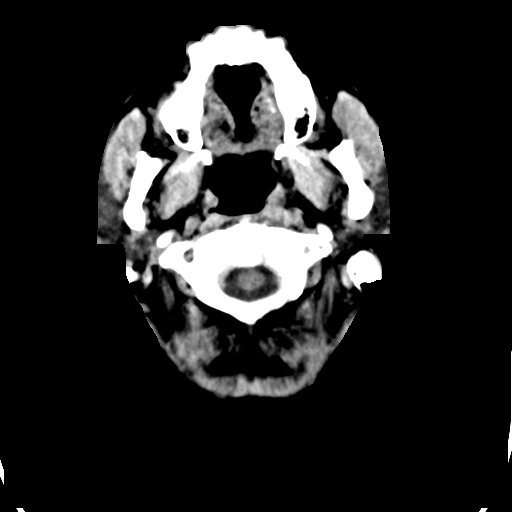
[im 3/32  bone]
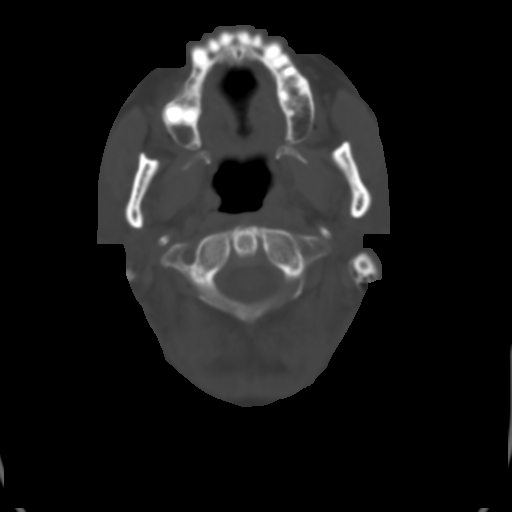
[im 6/32  brain]
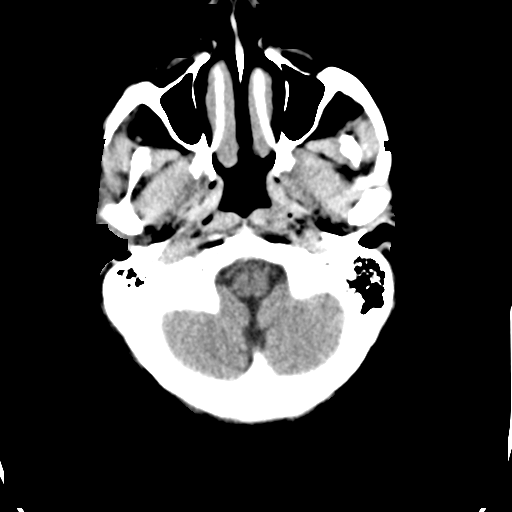
[im 9/32  brain]
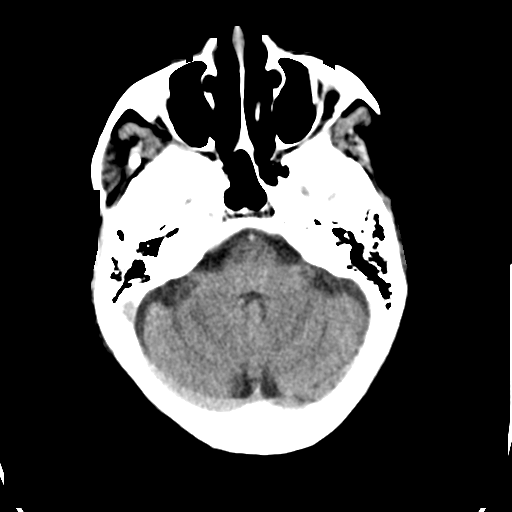
[im 12/32  brain]
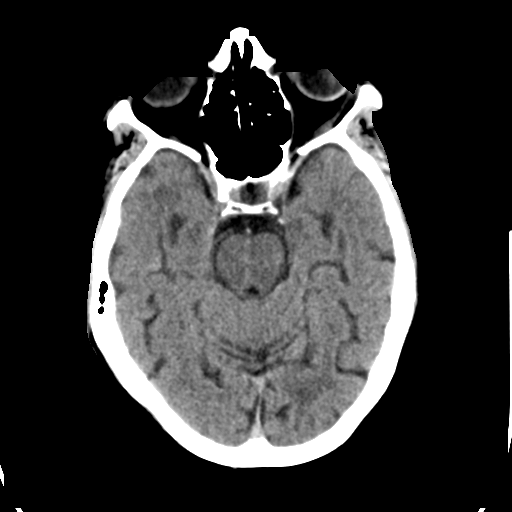
[im 17/32  brain]
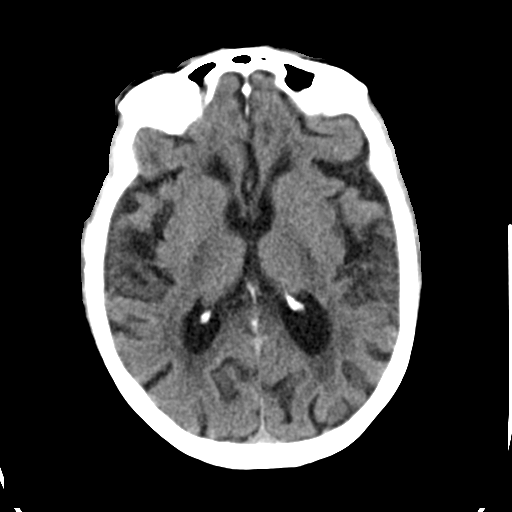
[im 17/32  bone]
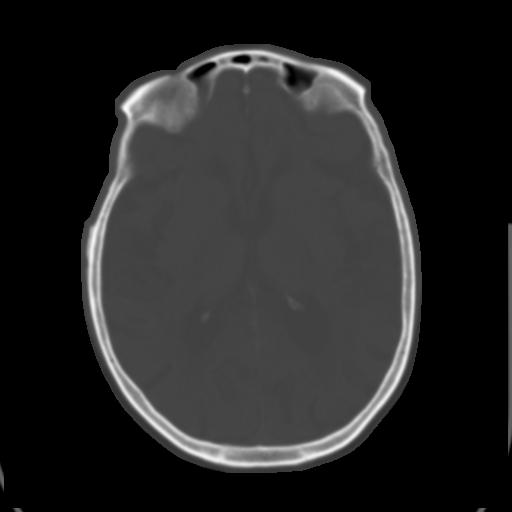
[im 20/32  brain]
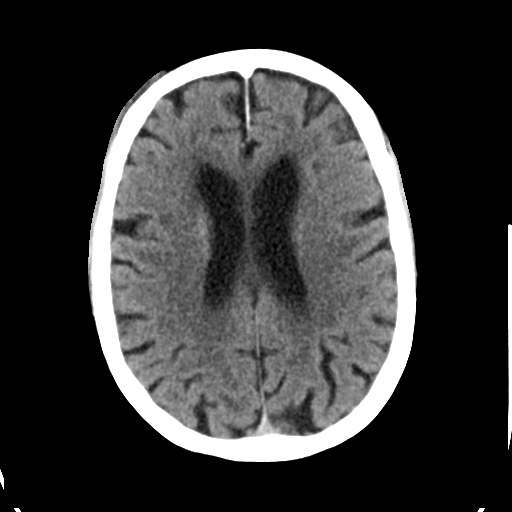
[im 23/32  brain]
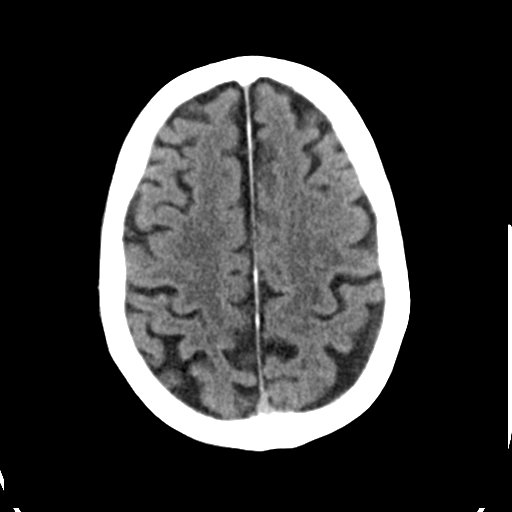
[im 26/32  brain]
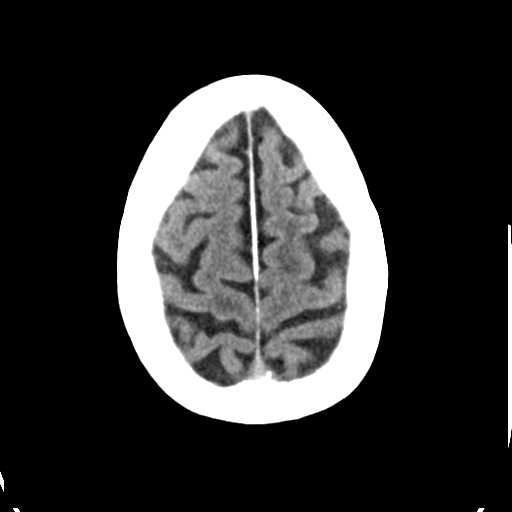
[im 29/32  brain]
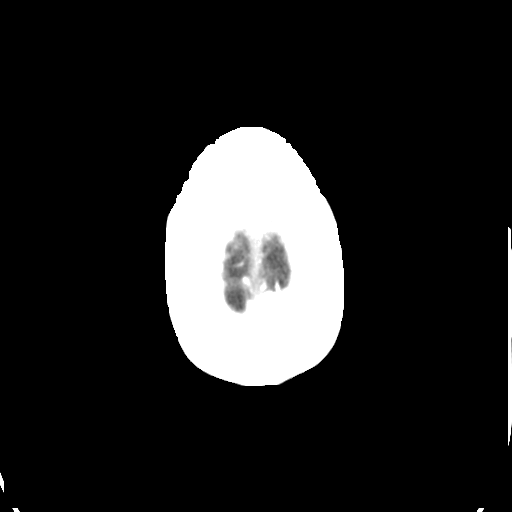
[im 29/32  bone]
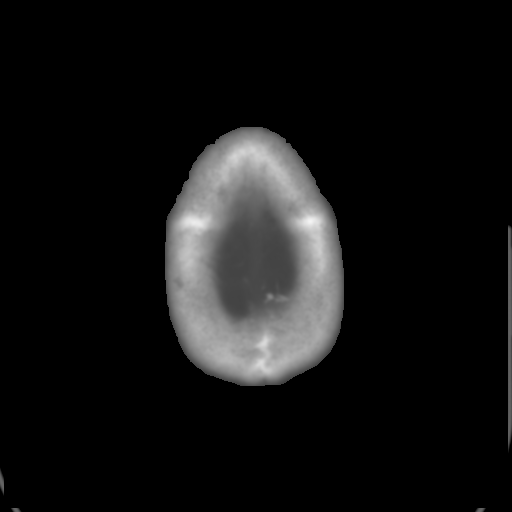

[Series 5: head 3.0 mpr cor · coronal · 0.33mm/px · 3 of 67 slices shown]
[im 23/67  brain]
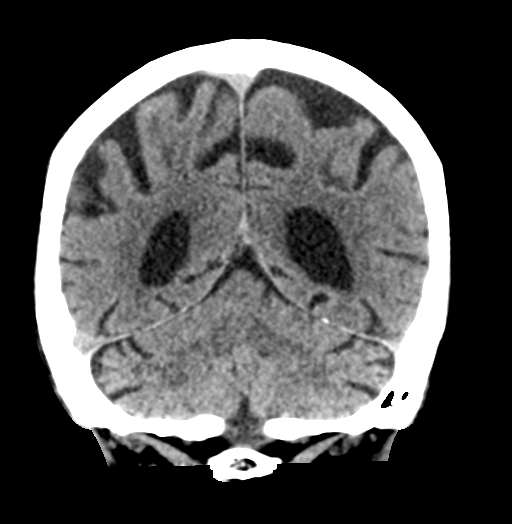
[im 30/67  brain]
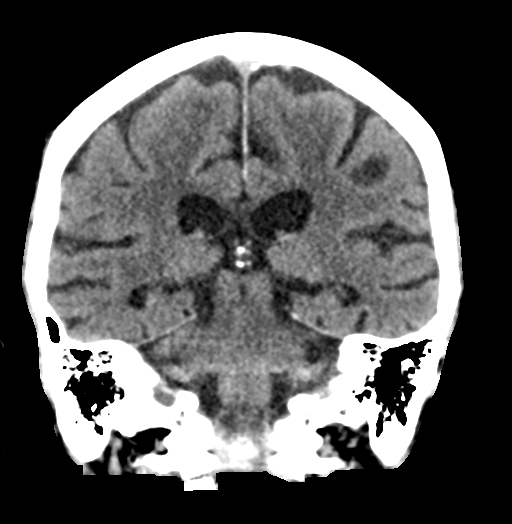
[im 37/67  brain]
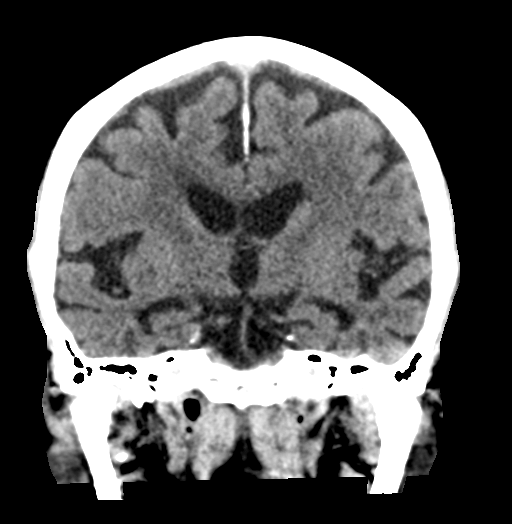

[Series 6: head 3.0 mpr sag · sagittal · 0.34mm/px · 3 of 51 slices shown]
[im 17/51  brain]
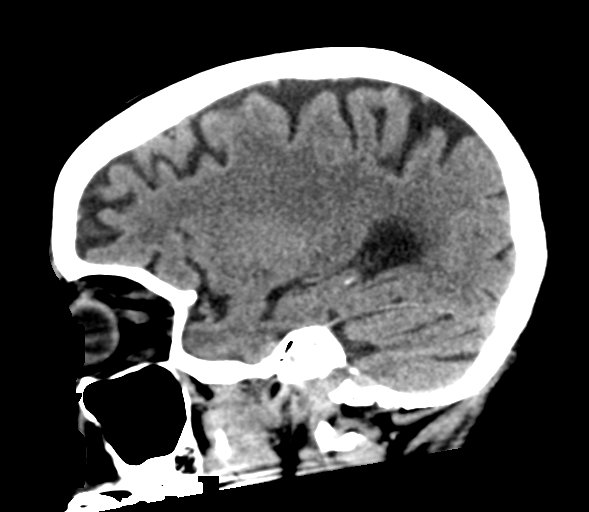
[im 26/51  brain]
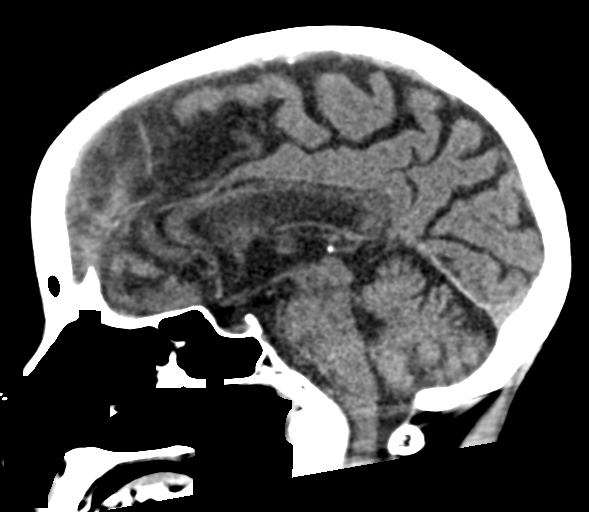
[im 34/51  brain]
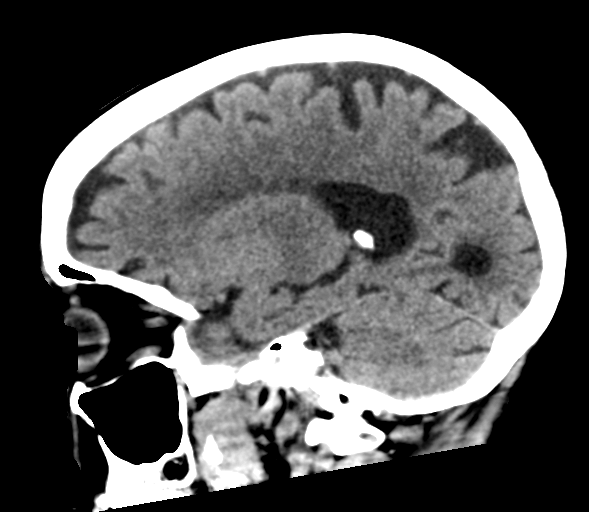

[15 of 47 positions shown; findings below may reference images not displayed]

FINDINGS: Brain: No evidence of acute infarction, hemorrhage, hydrocephalus,
extra-axial collection or mass lesion/mass effect. Symmetric
prominence of the ventricles, cisterns and sulci compatible with
parenchymal volume loss. Patchy areas of white matter
hypoattenuation are most compatible with chronic microvascular
angiopathy. Few benign dural calcifications, similar to prior
senescent mineralization of the basal ganglia

Vascular: Atherosclerotic calcification of the carotid siphons and
intradural vertebral arteries.

Skull: It has right frontal and supraorbital scalp swelling with
small laceration and trace subgaleal hematoma measuring up to 2 mm
maximal thickness (EX 4/47). Mild palpebral swelling. No subjacent
osseous injury or calvarial fracture identified.

Sinuses/Orbits: Paranasal sinuses and mastoid air cells are
predominantly clear. Orbital structures are unremarkable aside from
prior lens extractions. Senescent plaque of the sclera.

Other: Overlying bandaging material is present.
IMPRESSION: 1. Right frontal and supraorbital scalp swelling with small
laceration and trace subgaleal hematoma. No subjacent osseous injury
or calvarial fracture.
2. No acute intracranial abnormality.

## 2019-01-05 MED ORDER — LIDOCAINE-EPINEPHRINE (PF) 2 %-1:200000 IJ SOLN
10.0000 mL | Freq: Once | INTRAMUSCULAR | Status: AC
  Administered 2019-01-05: 10 mL

## 2019-01-05 MED ORDER — LIDOCAINE-EPINEPHRINE (PF) 2 %-1:200000 IJ SOLN
INTRAMUSCULAR | Status: AC
  Filled 2019-01-05: qty 20

## 2019-01-05 NOTE — ED Provider Notes (Signed)
Emergency Department Provider Note   I have reviewed the triage vital signs and the nursing notes.   HISTORY  Chief Complaint Fall   HPI Mia [name] is a 85 y.o. female presents to the ED after mechanical fall at [employer]. Patient states that she tripped over the bathroom floor and may have struck her head on something on the way down. No LOC. [name] HA with bleeding. No neck pain. Patient is on plavix. No vision change. No arm/leg pain.    Past Medical History:  Diagnosis Date   Arthritis    oa   Chronic kidney disease    chronic kidney diseaase follow by primary md   Diabetes mellitus without complication ([place])    Diet controlled   Former smoker    H/O ventricular fibrillation 10 yrs ago dx   Hypertension    Prediabetes    Seasonal allergies    TIA (transient ischemic attack) none recent    Patient Active Problem List   Diagnosis Date Noted   CKD (chronic kidney disease) stage 4, GFR 15-29 ml/min ([place]) 09/11/2017   Imbalance 09/16/2016   At risk for falls 09/16/2016   Muscle weakness 09/16/2016   Raynaud's disease without gangrene 06/10/2016   Lower leg edema 06/03/2016   Gout of multiple sites 06/03/2016   Hereditary and idiopathic peripheral neuropathy 06/03/2016   History of total knee replacement 10/06/2015   RBBB 09/06/2015   Preoperative cardiovascular examination 09/06/2015   [name] cognitive impairment with memory loss 09/04/2015   Loss of weight 09/04/2015   Primary osteoarthritis of right knee 07/15/2014   Xerosis cutis 07/15/2014   Superficial burn of groin 11/24/2013   B12 deficiency 09/23/2013   Essential hypertension, benign 09/23/2013   PVC's (premature ventricular contractions) 09/23/2013   Vitamin D deficiency 09/23/2013   Hyperglycemia 09/23/2013   History of TIAs 08/06/2013   DOE (dyspnea on exertion) 08/06/2013    Past Surgical History:  Procedure Laterality Date   NM MYOCAR PERF WALL MOTION   01/2008   lexiscan myoview - normal pattern of perfusion in all regions, EF 92%, no significant ischemia demonstated   TONSILLECTOMY  age 63   and adenoids   TOTAL KNEE ARTHROPLASTY Right 10/06/2015   Procedure: RIGHT TOTAL KNEE ARTHROPLASTY;  Surgeon: [name], MD;  Location: [facility];  Service: Orthopedics;  Laterality: Right;   TRANSTHORACIC ECHOCARDIOGRAM  12/2005   [name] DUST, EF=>55%; [name] mitral annular calcif; [name]-mod aortic root calcif    Allergies Patient has no known allergies.  Family History  Problem Relation Age of Onset   Congestive Heart Failure Mother    Alcohol abuse Father    Heart disease Son    Bipolar disorder Son    Lung cancer Brother    Post-traumatic stress disorder Daughter     Social History Social History   Tobacco Use   Smoking status: Former Smoker    Packs/day: 0.25    Years: 40.00    Pack years: 10.00    Types: Cigarettes    Quit date: 07/30/2000    Years since quitting: 18.4   Smokeless tobacco: Never Used  Substance Use Topics   Alcohol use: Yes    Alcohol/week: 7.0 standard drinks    Types: 7 Glasses of wine per week   Drug use: No    Review of Systems  Constitutional: No fever/chills Eyes: No visual changes. ENT: No sore throat. Cardiovascular: Denies chest pain. Respiratory: Denies shortness of breath. Gastrointestinal: No abdominal pain.  No  nausea, no vomiting.  No diarrhea.  No constipation. Genitourinary: Negative for dysuria. Musculoskeletal: Negative for back pain. Skin: Negative for rash. Forehead laceration.  Neurological: Negative for headaches, focal weakness or numbness.  10-point ROS otherwise negative.  ____________________________________________   PHYSICAL EXAM:  VITAL SIGNS: Vitals:   01/05/19 2333 01/05/19 2348  BP: (!) 159/79   Pulse: 96   Resp: (!) 24   Temp:  98.7 F (37.1 C)  SpO2: 100%    Constitutional: Alert and oriented. Well appearing and in no acute  distress. Eyes: Conjunctivae are normal. PERRL.  Head: 2 cm right forehead hematoma with 1 cm laceration with persistent oozing.  Nose: No congestion/rhinnorhea. Mouth/Throat: Mucous membranes are moist.  Neck: No stridor. No cervical spine tenderness to palpation. Cardiovascular: Normal rate, regular rhythm. Respiratory: Normal respiratory effort.   Gastrointestinal: No distention.  Musculoskeletal: No gross deformities of extremities. Neurologic:  Normal speech and language. Skin:  Skin is warm. 1 cm laceration to the right forehead.   ____________________________________________  RADIOLOGY  Ct Head Wo Contrast  Result Date: 01/05/2019 CLINICAL DATA:  [place] from bed, right supraorbital hematoma and puncture wound EXAM: CT HEAD WITHOUT CONTRAST TECHNIQUE: Contiguous axial images were obtained from the base of the skull through the vertex without intravenous contrast. COMPARISON:  CT January 18, 2016 FINDINGS: Brain: No evidence of acute infarction, hemorrhage, hydrocephalus, extra-axial collection or mass lesion/mass effect. Symmetric prominence of the ventricles, cisterns and sulci compatible with parenchymal volume loss. Patchy areas of white matter hypoattenuation are most compatible with chronic microvascular angiopathy. Few benign dural calcifications, similar to prior senescent mineralization of the basal ganglia Vascular: Atherosclerotic calcification of the carotid siphons and intradural vertebral arteries. Skull: It has right frontal and supraorbital scalp swelling with small laceration and trace subgaleal hematoma measuring up to 2 mm maximal thickness (EX 4/47). [name] palpebral swelling. No subjacent osseous injury or calvarial fracture identified. Sinuses/Orbits: Paranasal sinuses and mastoid air cells are predominantly clear. Orbital structures are unremarkable aside from prior lens extractions. Senescent plaque of the sclera. Other: Overlying bandaging material is present. IMPRESSION:  1. Right frontal and supraorbital scalp swelling with small laceration and trace subgaleal hematoma. No subjacent osseous injury or calvarial fracture. 2. No acute intracranial abnormality. Electronically Signed   By: Lovena Le M.D.   On: 01/05/2019 23:36    ____________________________________________   PROCEDURES  Procedure(s) performed:   Marland KitchenMarland KitchenLaceration Repair  Date/Time: 01/06/2019 9:42 AM Performed by: Margette Fast, MD Authorized by: Margette Fast, MD   Consent:    Consent obtained:  Verbal   Consent given by:  Patient   Risks discussed:  Infection, need for additional repair, poor wound healing, poor cosmetic result, pain and vascular damage   Alternatives discussed:  No treatment Anesthesia (see MAR for exact dosages):    Anesthesia method:  Local infiltration   Local anesthetic:  Lidocaine 2% WITH epi Laceration details:    Location:  Face   Face location:  Forehead   Length (cm):  1 Repair type:    Repair type:  Simple Pre-procedure details:    Preparation:  Patient was prepped and draped in usual sterile fashion and imaging obtained to evaluate for foreign bodies Exploration:    Hemostasis achieved with:  Direct pressure   Wound exploration: entire depth of wound probed and visualized     Wound extent: no foreign bodies/material noted, no muscle damage noted, no nerve damage noted and no underlying fracture noted     Contaminated: no  Treatment:    Area cleansed with:  Betadine   Amount of cleaning:  Standard Skin repair:    Repair method:  Sutures   Suture size:  5-0   Suture material:  Prolene   Suture technique:  Simple interrupted   Number of sutures:  2 Approximation:    Approximation:  Close Post-procedure details:    Dressing:  Open (no dressing)   Patient tolerance of procedure:  Tolerated well, no immediate complications     ____________________________________________   INITIAL IMPRESSION / ASSESSMENT AND PLAN / ED COURSE  Pertinent  labs & imaging results that were available during my care of the patient were reviewed by me and considered in my medical decision making (see chart for details).   Patient presents to the ED with fall and head injury. Persistent oozing noted from small forehead laceration controlled with suture as above. Patient to have suture removed in 7 days. CT head negative. Plan for discharge back to ALF.    ____________________________________________  FINAL CLINICAL IMPRESSION(S) / ED DIAGNOSES  Final diagnoses:  Fall, initial encounter  Injury of head, initial encounter  Laceration of scalp, initial encounter     MEDICATIONS GIVEN DURING THIS VISIT:  Medications  lidocaine-EPINEPHrine (XYLOCAINE W/EPI) 2 %-1:200000 (PF) injection 10 mL (10 mLs Infiltration Not Given 01/05/19 2258)     Note:  This document was prepared using Dragon voice recognition software and may include unintentional dictation errors.  Nanda Quinton, MD Emergency Medicine    Aloma Boch, Wonda [facility], MD 01/06/19 [phone]

## 2019-01-05 NOTE — ED Triage Notes (Addendum)
Patient brought by ems from [facility] (apartments) after having a mechanical fall coming out of the bathroom.  Hematoma and puncture wound noted above right eyebrow.  BP elevated at this time.  Reports she hasn't taken her evening meds yet.  Pt is on plavix.

## 2019-01-05 NOTE — Discharge Instructions (Signed)
You were seen in the emergency department today with a head injury.  Your laceration was repaired with stitches.  This will need to be removed in the next 7 days.  You can call your PCP or return to the emergency department for removal.

## 2019-01-06 NOTE — ED Notes (Signed)
Ptar called for pt 

## 2019-01-13 ENCOUNTER — Other Ambulatory Visit: Payer: Self-pay | Admitting: Internal Medicine

## 2019-01-13 DIAGNOSIS — I10 Essential (primary) hypertension: Secondary | ICD-10-CM

## 2019-01-13 DIAGNOSIS — R600 Localized edema: Secondary | ICD-10-CM

## 2019-01-15 ENCOUNTER — Telehealth: Payer: Self-pay

## 2019-01-15 ENCOUNTER — Ambulatory Visit: Payer: Medicare Other | Admitting: Family

## 2019-01-15 NOTE — Telephone Encounter (Signed)
Called patient to inquire about stitches and her missed appointment for today. Patient stated that she was told she could have them removed anywhere. [place] beach [place] beach. Patient apologized for not showing for appointment but states she was unaware she had an appointment. She was appreciative of [place] calling to check on her.

## 2019-02-01 ENCOUNTER — Other Ambulatory Visit: Payer: Self-pay | Admitting: *Deleted

## 2019-02-01 DIAGNOSIS — I10 Essential (primary) hypertension: Secondary | ICD-10-CM

## 2019-02-01 DIAGNOSIS — R600 Localized edema: Secondary | ICD-10-CM

## 2019-02-01 MED ORDER — METOPROLOL SUCCINATE ER 25 MG PO TB24: ORAL_TABLET | ORAL | 1 refills | Status: DC

## 2019-02-01 MED ORDER — POTASSIUM CHLORIDE ER 10 MEQ PO TBCR: 10.0000 meq | EXTENDED_RELEASE_TABLET | Freq: Every day | ORAL | 0 refills | Status: DC

## 2019-02-01 NOTE — Telephone Encounter (Signed)
[facility]

## 2019-02-01 NOTE — Telephone Encounter (Signed)
[facility] 

## 2019-02-15 ENCOUNTER — Other Ambulatory Visit: Payer: Self-pay

## 2019-02-15 ENCOUNTER — Ambulatory Visit (INDEPENDENT_AMBULATORY_CARE_PROVIDER_SITE_OTHER): Payer: Medicare Other | Admitting: Podiatry

## 2019-02-15 ENCOUNTER — Encounter: Payer: Self-pay | Admitting: Podiatry

## 2019-02-15 DIAGNOSIS — B351 Tinea unguium: Secondary | ICD-10-CM | POA: Diagnosis not present

## 2019-02-15 DIAGNOSIS — M79676 Pain in unspecified toe(s): Secondary | ICD-10-CM

## 2019-02-15 DIAGNOSIS — E1142 Type 2 diabetes mellitus with diabetic polyneuropathy: Secondary | ICD-10-CM

## 2019-02-15 DIAGNOSIS — L84 Corns and callosities: Secondary | ICD-10-CM

## 2019-02-15 NOTE — Patient Instructions (Signed)
Diabetes Mellitus and Foot Care Foot care is an important part of your health, especially when you have diabetes. Diabetes may cause you to have problems because of poor blood flow (circulation) to your feet and legs, which can cause your skin to:  Become thinner and drier.  Break more easily.  Heal more slowly.  Peel and crack. You may also have nerve damage (neuropathy) in your legs and feet, causing decreased feeling in them. This means that you may not notice minor injuries to your feet that could lead to more serious problems. Noticing and addressing any potential problems early is the best way to prevent future foot problems. How to care for your feet Foot hygiene  Wash your feet daily with warm water and mild soap. Do not use hot water. Then, pat your feet and the areas between your toes until they are completely dry. Do not soak your feet as this can dry your skin.  Trim your toenails straight across. Do not dig under them or around the cuticle. File the edges of your nails with an emery board or nail file.  Apply a moisturizing lotion or petroleum jelly to the skin on your feet and to dry, brittle toenails. Use lotion that does not contain alcohol and is unscented. Do not apply lotion between your toes. Shoes and socks  Wear clean socks or stockings every day. Make sure they are not too tight. Do not wear knee-high stockings since they may decrease blood flow to your legs.  Wear shoes that fit properly and have enough cushioning. Always look in your shoes before you put them on to be sure there are no objects inside.  To break in new shoes, wear them for just a few hours a day. This prevents injuries on your feet. Wounds, scrapes, corns, and calluses  Check your feet daily for blisters, cuts, bruises, sores, and redness. If you cannot see the bottom of your feet, use a mirror or ask someone for help.  Do not cut corns or calluses or try to remove them with medicine.  If you  find a minor scrape, cut, or break in the skin on your feet, keep it and the skin around it clean and dry. You may clean these areas with mild soap and water. Do not clean the area with peroxide, alcohol, or iodine.  If you have a wound, scrape, corn, or callus on your foot, look at it several times a day to make sure it is healing and not infected. Check for: ? Redness, swelling, or pain. ? Fluid or blood. ? Warmth. ? Pus or a bad smell. General instructions  Do not cross your legs. This may decrease blood flow to your feet.  Do not use heating pads or hot water bottles on your feet. They may burn your skin. If you have lost feeling in your feet or legs, you may not know this is happening until it is too late.  Protect your feet from hot and cold by wearing shoes, such as at the beach or on hot pavement.  Schedule a complete foot exam at least once a year (annually) or more often if you have foot problems. If you have foot problems, report any cuts, sores, or bruises to your health care provider immediately. Contact a health care provider if:  You have a medical condition that increases your risk of infection and you have any cuts, sores, or bruises on your feet.  You have an injury that is not   healing.  You have redness on your legs or feet.  You feel burning or tingling in your legs or feet.  You have pain or cramps in your legs and feet.  Your legs or feet are numb.  Your feet always feel cold.  You have pain around a toenail. Get help right away if:  You have a wound, scrape, corn, or callus on your foot and: ? You have pain, swelling, or redness that gets worse. ? You have fluid or blood coming from the wound, scrape, corn, or callus. ? Your wound, scrape, corn, or callus feels warm to the touch. ? You have pus or a bad smell coming from the wound, scrape, corn, or callus. ? You have a fever. ? You have a red line going up your leg. Summary  Check your feet every day  for cuts, sores, red spots, swelling, and blisters.  Moisturize feet and legs daily.  Wear shoes that fit properly and have enough cushioning.  If you have foot problems, report any cuts, sores, or bruises to your health care provider immediately.  Schedule a complete foot exam at least once a year (annually) or more often if you have foot problems. This information is not intended to replace advice given to you by your health care provider. Make sure you discuss any questions you have with your health care provider. Document Released: 05/03/2000 Document Revised: 06/18/2017 Document Reviewed: 06/07/2016 Elsevier Patient Education  2020 Elsevier Inc.   Onychomycosis/Fungal Toenails  WHAT IS IT? An infection that lies within the keratin of your nail plate that is caused by a fungus.  WHY ME? Fungal infections affect all ages, sexes, races, and creeds.  There may be many factors that predispose you to a fungal infection such as age, coexisting medical conditions such as diabetes, or an autoimmune disease; stress, medications, fatigue, genetics, etc.  Bottom line: fungus thrives in a warm, moist environment and your shoes offer such a location.  IS IT CONTAGIOUS? Theoretically, yes.  You do not want to share shoes, nail clippers or files with someone who has fungal toenails.  Walking around barefoot in the same room or sleeping in the same bed is unlikely to transfer the organism.  It is important to realize, however, that fungus can spread easily from one nail to the next on the same foot.  HOW DO WE TREAT THIS?  There are several ways to treat this condition.  Treatment may depend on many factors such as age, medications, pregnancy, liver and kidney conditions, etc.  It is best to ask your doctor which options are available to you.  1. No treatment.   Unlike many other medical concerns, you can live with this condition.  However for many people this can be a painful condition and may lead to  ingrown toenails or a bacterial infection.  It is recommended that you keep the nails cut short to help reduce the amount of fungal nail. 2. Topical treatment.  These range from herbal remedies to prescription strength nail lacquers.  About 40-50% effective, topicals require twice daily application for approximately 9 to 12 months or until an entirely new nail has grown out.  The most effective topicals are medical grade medications available through physicians offices. 3. Oral antifungal medications.  With an 80-90% cure rate, the most common oral medication requires 3 to 4 months of therapy and stays in your system for a year as the new nail grows out.  Oral antifungal medications do require   blood work to make sure it is a safe drug for you.  A liver function panel will be performed prior to starting the medication and after the first month of treatment.  It is important to have the blood work performed to avoid any harmful side effects.  In general, this medication safe but blood work is required. 4. Laser Therapy.  This treatment is performed by applying a specialized laser to the affected nail plate.  This therapy is noninvasive, fast, and non-painful.  It is not covered by insurance and is therefore, out of pocket.  The results have been very good with a 80-95% cure rate.  The [facility] is the only practice in the area to offer this therapy. 5. Permanent Nail Avulsion.  Removing the entire nail so that a new nail will not grow back. 

## 2019-02-17 NOTE — Progress Notes (Signed)
Subjective: Mia [name] is a 85 y.o. y.o. female who presents today with h/o diabetes cc of painful, discolored, thick toenails and painful callus/corn which interfere with daily activities. Pain is aggravated when wearing enclosed shoe gear and relieved with periodic professional debridement.  Reed, Tiffany L, DO is her PCP.   Current Outpatient Medications on File Prior to Visit  Medication Sig Dispense Refill  . Ascorbic Acid (VITAMIN C) 1000 MG tablet Take 1,000 mg by mouth daily.    . cholecalciferol (VITAMIN D) 1000 UNITS tablet Take 1,000 Units by mouth daily. D3    . clopidogrel (PLAVIX) 75 MG tablet TAKE 1 TABLET(75 MG) BY MOUTH DAILY 90 tablet 1  . hydrochlorothiazide (HYDRODIURIL) 12.5 MG tablet TAKE 1 TABLET BY MOUTH EVERY OTHER DAY 45 tablet 3  . ketorolac (ACULAR) 0.5 % ophthalmic solution Place 1 drop into the right eye 2 (two) times daily.    . metoprolol succinate (TOPROL-XL) 25 MG 24 hr tablet Take one tablet by mouth once daily 90 tablet 1  . potassium chloride (K-DUR) 10 MEQ tablet Take 1 tablet (10 mEq total) by mouth daily. 90 tablet 0   No current facility-administered medications on file prior to visit.     No Known Allergies  Objective:  Vascular Examination: Capillary refill time immediate to all 10 digits.    Dorsalis pedis and posterior tibial pulses palpable bilaterally.    Sparse digital hair noted bilaterally.    Skin temperature gradient within normal limits bilaterally.  Dermatological Examination: Skin with normal turgor, texture and tone b/l.  Toenails 1-5 b/l discolored, thick, dystrophic with subungual debris and pain with palpation to nailbeds due to thickness of nails.  Hyperkeratotic lesions noted submetatarsal heads 3, 4 right foot, first metatarsal head bilaterally, and dorsal aspect of the left fourth digit PIPJ.  No erythema, no edema, no drainage, no flocculence noted.   Musculoskeletal: Muscle strength 5/5 to all LE muscle  groups.  Hammertoe deformity fourth digit left foot.  Neurological: Sensation diminished with 10 gram monofilament.   Assessment: 1. Painful onychomycosis toenails 1-5 b/l 2.  Calluses submetatarsal heads 3, 4 right foot, first metatarsal head bilaterally 3.  Corn left fourth digit 4.  NIDDM  Plan: 1. Continue diabetic foot care principles. Literature dispensed on today. 2. Toenails 1-5 b/l were debrided in length and girth without iatrogenic bleeding. 3. Hyperkeratotic lesions submetatarsal heads 3, 4 right foot, first metatarsal head bilaterally, and dorsal aspect of the left fourth digit PIPJ pared with sterile scalpel blade without incident. 4. Patient to report any pedal injuries to medical professional immediately. 5. Follow up 3 months. 6. Patient/POA to call should there be a concern in the interim.

## 2019-03-31 ENCOUNTER — Telehealth: Payer: Self-pay

## 2019-03-31 DIAGNOSIS — Z9181 History of falling: Secondary | ICD-10-CM

## 2019-03-31 DIAGNOSIS — M79671 Pain in right foot: Secondary | ICD-10-CM

## 2019-03-31 NOTE — Telephone Encounter (Signed)
I wish she would have made an appt today or kept tomorrow's.  It sounds to me like she needs xrays of her foot and ankle.  Which foot/ankle?  Let me know if she's willing to go to [place].  Sounds like she'll need someone to transport her.

## 2019-03-31 NOTE — Telephone Encounter (Signed)
Patient called back and states she hurt her right foot and it's not the ankle. She states she hurt the side of right foot. She is in agreement with providers advise to having an x-ray and will need transportation. Routing to provider to advise.

## 2019-03-31 NOTE — Telephone Encounter (Signed)
Patient notified and agreed. Was very appreciative and stated she will try to get [facility] to take her to [facility] tomorrow for X-ray. No further questions at this time but was told to call office if she needed anything further.

## 2019-03-31 NOTE — Telephone Encounter (Signed)
Tried several attempts to call patient. Calls go to voicemail. LMOM for patient to call office.

## 2019-03-31 NOTE — Telephone Encounter (Signed)
Xray order placed for right foot.  Please notify patient that she can go to [place] at her convenience tomorrow when she can get transportation.  She may use rest, ice, and elevation, as well as tylenol, in the interim.  Verner Kopischke L. Yaasir Menken, D.O. [facility] [address], [place] [address] Cell Phone (Mon-Fri 8am-5pm):  [phone] On Call:  [phone] & follow prompts after 5pm & weekends Office Phone:  [phone] Office Fax:  [phone]

## 2019-03-31 NOTE — Telephone Encounter (Signed)
Patient called this afternoon to cancel follow up appointment with provider and stated she had suffered a somewhat fall last night. She states her ankle is tender and swollen and it is hard to walk. Offered patient an appointment to be seen today but she refused and stated she thought she would be ok. Rescheduled patient's appointment with provider and told patient I would inform her provider. She states she fell a couple weeks ago as well but did not feel she needed to be seen. Let patient know to please call office if her ankle is still swollen and tender.

## 2019-04-01 ENCOUNTER — Ambulatory Visit: Payer: Medicare Other | Admitting: Internal Medicine

## 2019-04-02 ENCOUNTER — Other Ambulatory Visit: Payer: Self-pay

## 2019-04-02 ENCOUNTER — Ambulatory Visit: Payer: Self-pay | Admitting: Family

## 2019-04-02 ENCOUNTER — Telehealth: Payer: Self-pay

## 2019-04-02 ENCOUNTER — Ambulatory Visit
Admission: RE | Admit: 2019-04-02 | Discharge: 2019-04-02 | Disposition: A | Discharge status: Home / Self Care | Payer: Medicare Other | Source: Ambulatory Visit | Attending: Internal Medicine | Admitting: Internal Medicine

## 2019-04-02 DIAGNOSIS — M79671 Pain in right foot: Secondary | ICD-10-CM

## 2019-04-02 DIAGNOSIS — Z9181 History of falling: Secondary | ICD-10-CM

## 2019-04-02 IMAGING — CR DG FOOT COMPLETE 3+V*R*
3 series · 3 of 3 positions shown · non-contrast
Comparison: None.

CLINICAL DATA: Fall. RIGHT heel pain

EXAM:
RIGHT FOOT COMPLETE - 3+ VIEW

[x foot ap right]
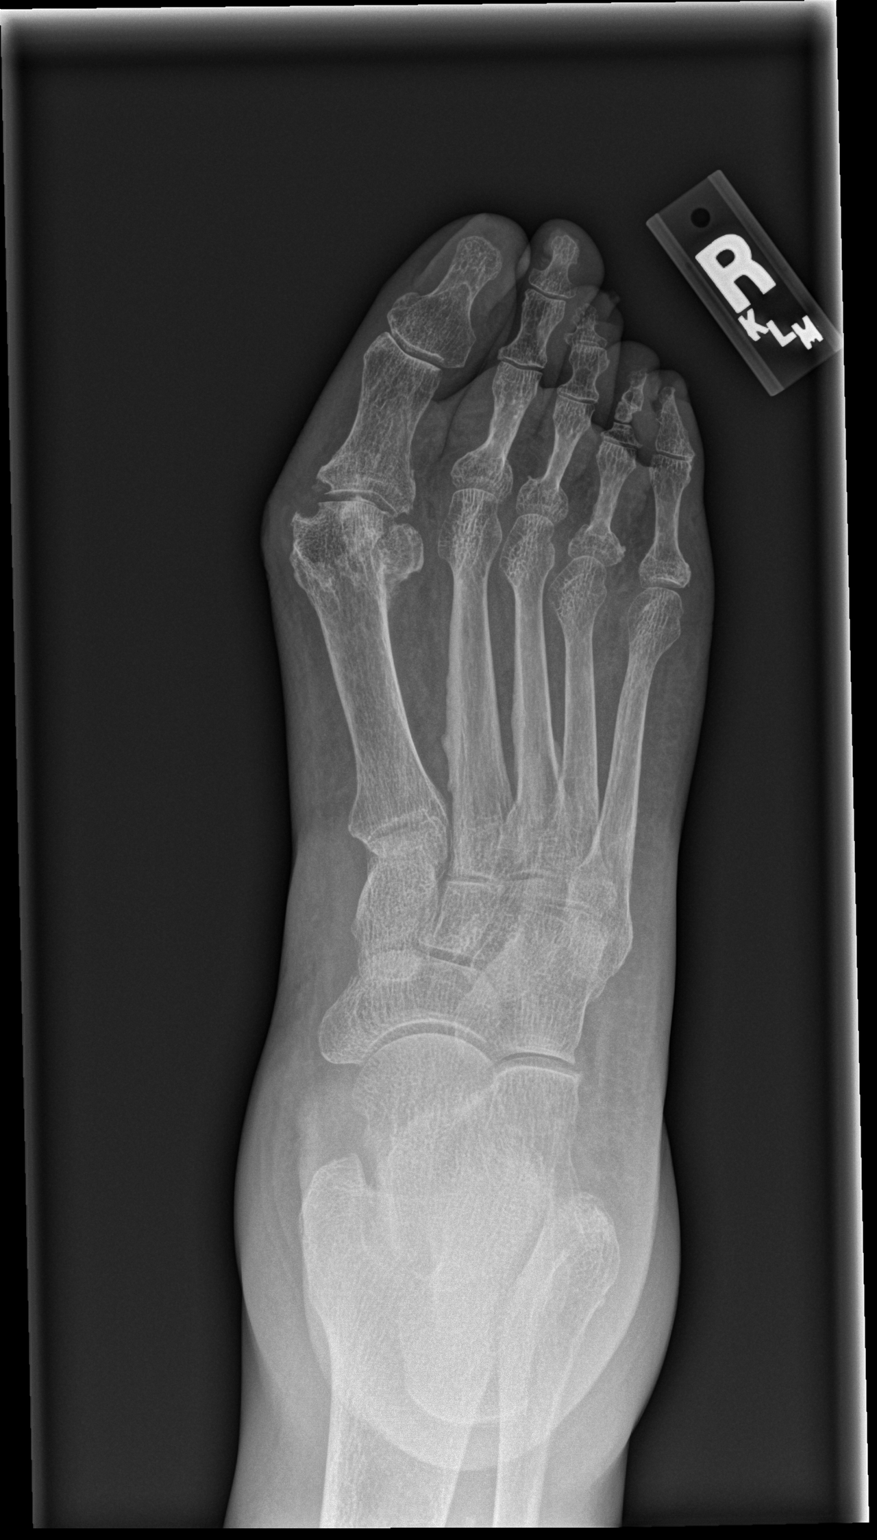

[x foot obl right]
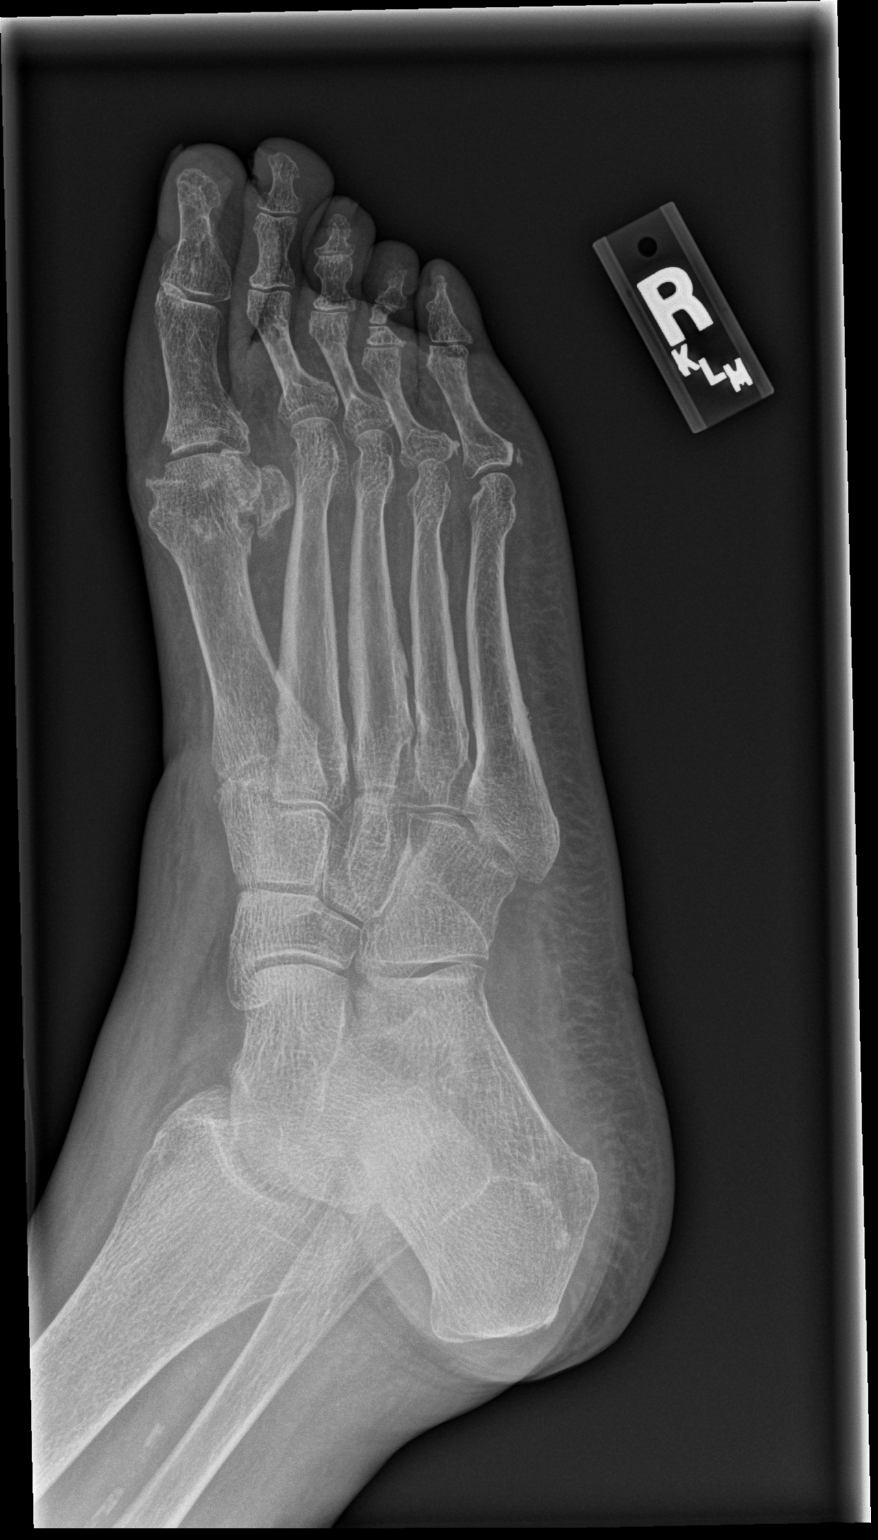

[x foot lat right]
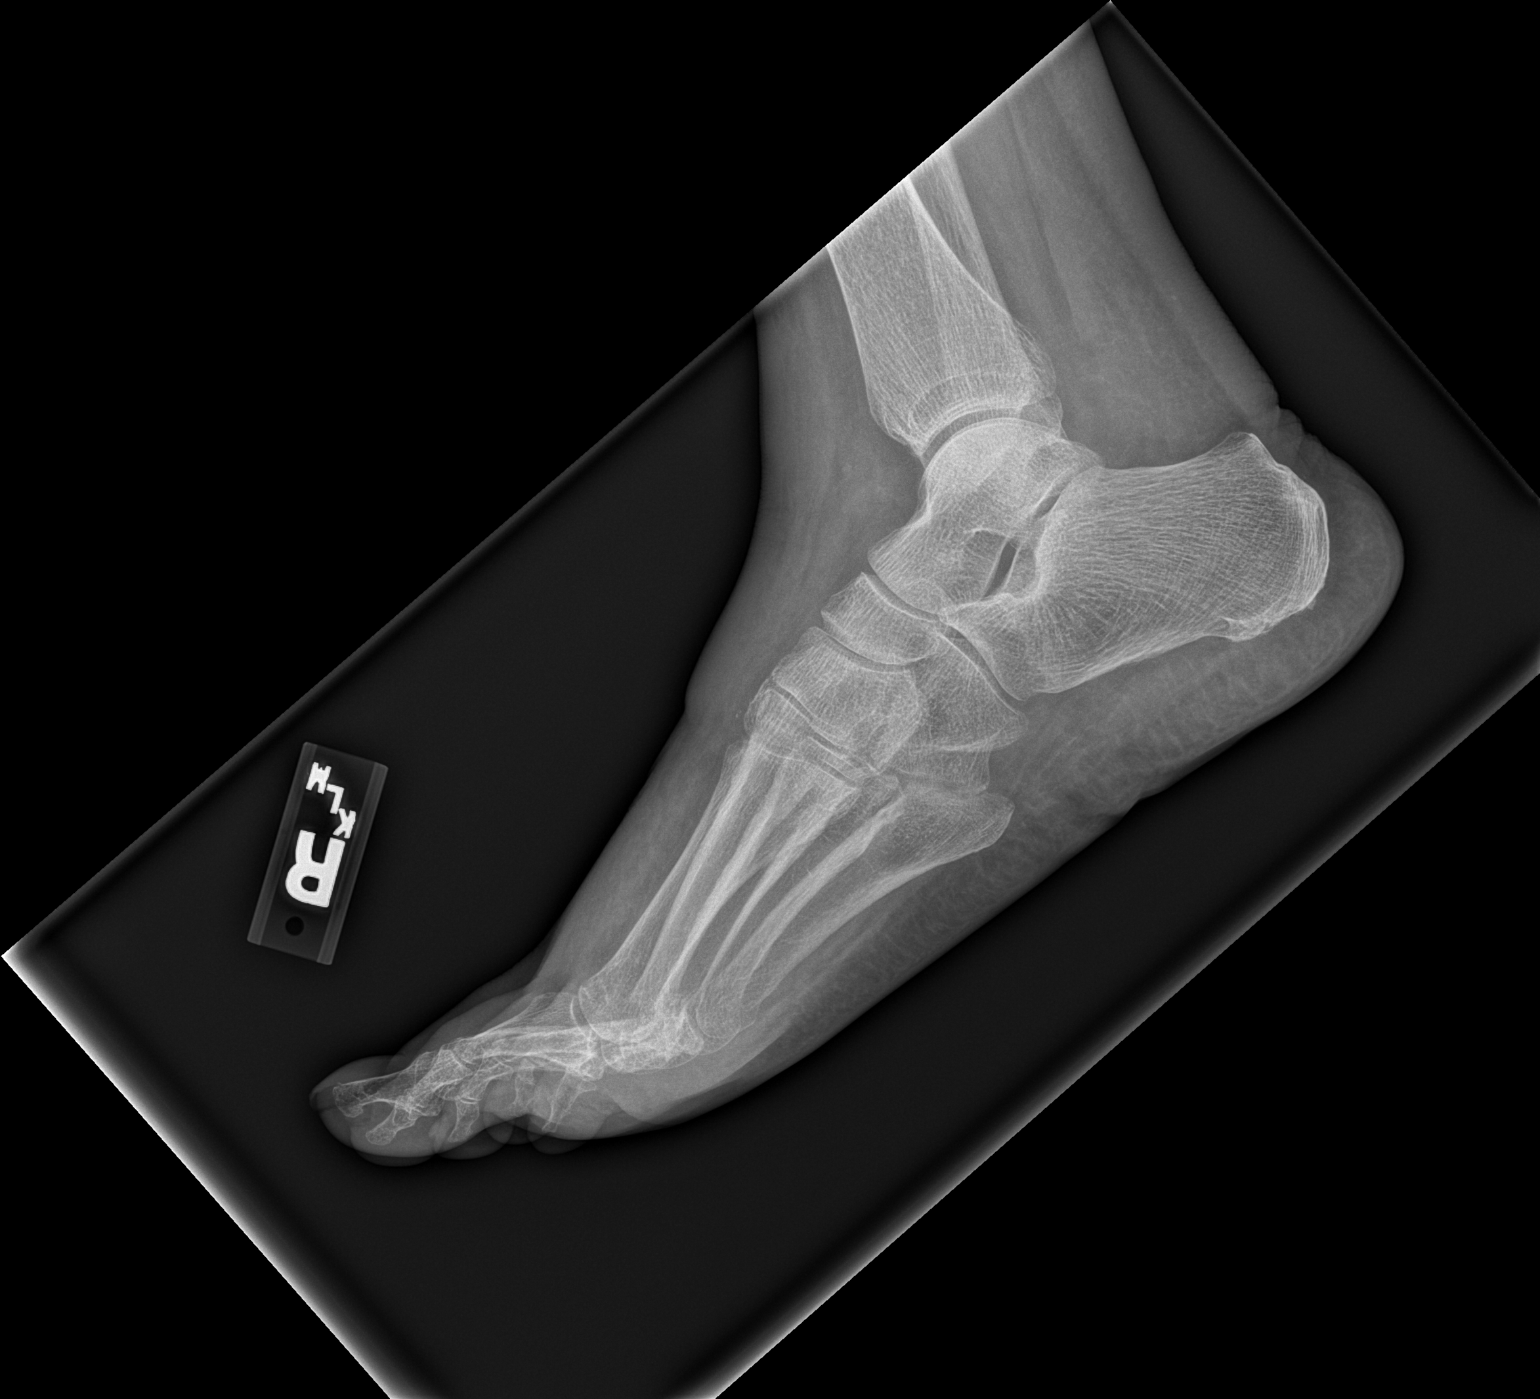

[3 of 3 positions shown; findings below may reference images not displayed]

FINDINGS: No fracture or dislocation of mid foot or forefoot. The phalanges
are normal. The calcaneus is normal. No soft tissue abnormality.

Hallux valgus deformity of the first ray.
IMPRESSION: 1. No acute osseous abnormality.
2. Hallux valgus deformity of the first ray.

## 2019-04-02 NOTE — Telephone Encounter (Signed)
Glad she's coming in. I did respond to her negative xray also and sent to you just now.

## 2019-04-02 NOTE — Telephone Encounter (Signed)
Tried another attempt at reaching out to patient. No answer. LMOM for patient to please call office.

## 2019-04-02 NOTE — Telephone Encounter (Signed)
Patient returned call and stated that she was able to do x-ray today around 1:30 or so. States now she is going to focus on keeping her foot elevated and getting some rest. Offered patient an appointment to come into office Monday to be seen by provider. Provider's schedule is completely full so offered to have patient come in to see Dinah. Patient was in agreement with making appointment. Routing to inform provider.

## 2019-04-02 NOTE — Telephone Encounter (Signed)
Patient called to find out where and whom her foot scan was with I informed her that it was with [facility] on [address] and she had no further questions

## 2019-04-02 NOTE — Telephone Encounter (Signed)
I haven't seen a report come through, unfortunately.

## 2019-04-02 NOTE — Telephone Encounter (Signed)
Patient called yesterday and stated her foot was still aching and did not want to get out. Stated she would today and have x-ray done. Tried calling patient today to see if she was able to go and get x-ray. No answer. LMOM for patient to call back.

## 2019-04-05 ENCOUNTER — Encounter: Payer: Self-pay | Admitting: Family

## 2019-04-05 ENCOUNTER — Ambulatory Visit (INDEPENDENT_AMBULATORY_CARE_PROVIDER_SITE_OTHER): Payer: Medicare Other | Admitting: Family

## 2019-04-05 ENCOUNTER — Other Ambulatory Visit: Payer: Self-pay

## 2019-04-05 VITALS — BP 138/78 | HR 70 | Temp 97.4°F | Ht 67.0 in | Wt 124.0 lb

## 2019-04-05 DIAGNOSIS — M7989 Other specified soft tissue disorders: Secondary | ICD-10-CM

## 2019-04-05 DIAGNOSIS — M79671 Pain in right foot: Secondary | ICD-10-CM

## 2019-04-05 NOTE — Progress Notes (Signed)
Provider: Yasha Tibbett FNP-C  Gayland Curry, DO  Patient Care Team: Gayland Curry, DO as PCP - General (Geriatric Medicine) Monna Fam, MD as Consulting Physician (Ophthalmology) Debara Pickett Nadean Corwin, MD as Consulting Physician (Cardiology) [name], MD as Consulting Physician (Orthopedic Surgery)  Extended Emergency Contact Information Primary Emergency Contact: Mcmahen,Steve Address: [address] [place] [address] Johnnette Litter of [place] Phone: [phone] Relation: Son Secondary Emergency Contact: Davis,Chandra Address: [address]          [place], [place] [address] Johnnette Litter of [place] Mobile Phone: [phone] Relation: Daughter  Code Status:  Goals of care: Advanced Directive information Advanced Directives 01/05/2019  Does Patient Have a Medical Advance Directive? Yes  Type of Paramedic of [place];Living will  Does patient want to make changes to medical advance directive? No - Patient declined  Copy of [place] in Chart? No - copy requested  Pre-existing out of facility DNR order (yellow form or pink MOST form) -     Chief Complaint  Patient presents with  . Follow-up    Post fall follow up, patient fell and hurt right foot fracture showed no x-ray. patient states foot is still swollen and painful     HPI:  Pt is a 85 y.o. female seen today for an acute visit for evaluation of right foot pain.she states on 03/31/2019 she almost fell caught herself on the bed but does not know whether her foot was got too.she complains of right lateral foot pain.X-ray was ordered by Dr.Reed on 04/02/2019 was negative for fracture.she states foot remains painful and swollen.Also has darker color on the ankle.she has applied ice alternation with heat but still swollen and painful.Has had chronic leg swelling since her right knee replacement.she has take tylenol 325 mg tablet Also too some  Gabapentin that she had left over this morning.Gabapentin has made her drowsy. She lives in an independent facility but was brought by her son who lives in [place].    Past Medical History:  Diagnosis Date  . Arthritis    oa  . Chronic kidney disease    chronic kidney diseaase follow by primary md  . Diabetes mellitus without complication ([place])    Diet controlled  . Former smoker   . H/O ventricular fibrillation 10 yrs ago dx  . Hypertension   . Prediabetes   . Seasonal allergies   . TIA (transient ischemic attack) none recent   Past Surgical History:  Procedure Laterality Date  . NM MYOCAR PERF WALL MOTION  01/2008   lexiscan myoview - normal pattern of perfusion in all regions, EF 92%, no significant ischemia demonstated  . TONSILLECTOMY  age 58   and adenoids  . TOTAL KNEE ARTHROPLASTY Right 10/06/2015   Procedure: RIGHT TOTAL KNEE ARTHROPLASTY;  Surgeon: [name], MD;  Location: [facility];  Service: Orthopedics;  Laterality: Right;  . TRANSTHORACIC ECHOCARDIOGRAM  12/2005   mild DUST, EF=>55%; mild mitral annular calcif; mild-mod aortic root calcif    No Known Allergies  Outpatient Encounter Medications as of 04/05/2019  Medication Sig  . Ascorbic Acid (VITAMIN C) 1000 MG tablet Take 1,000 mg by mouth daily.  . cholecalciferol (VITAMIN D) 1000 UNITS tablet Take 1,000 Units by mouth daily. D3  . clopidogrel (PLAVIX) 75 MG tablet TAKE 1 TABLET(75 MG) BY MOUTH DAILY  . hydrochlorothiazide (HYDRODIURIL) 12.5 MG tablet TAKE 1 TABLET BY MOUTH EVERY  OTHER DAY  . ketorolac (ACULAR) 0.5 % ophthalmic solution Place 1 drop into the right eye 2 (two) times daily.  . metoprolol succinate (TOPROL-XL) 25 MG 24 hr tablet Take one tablet by mouth once daily  . potassium chloride (K-DUR) 10 MEQ tablet Take 1 tablet (10 mEq total) by mouth daily.   No facility-administered encounter medications on file as of 04/05/2019.     Review of Systems  Constitutional: Negative for  appetite change, chills, fatigue and fever.  Respiratory: Negative for cough, chest tightness, shortness of breath and wheezing.   Cardiovascular: Positive for leg swelling. Negative for chest pain and palpitations.  Gastrointestinal: Negative for abdominal distention, abdominal pain, constipation, diarrhea, nausea and vomiting.  Musculoskeletal: Positive for arthralgias and gait problem.  Skin: Negative for color change, pallor and rash.  Neurological: Negative for dizziness, light-headedness and headaches.       Chronic numbness and tingling on feet     Immunization History  Administered Date(s) Administered  . Influenza, High Dose Seasonal PF 01/27/2016, 02/18/2018  . Influenza,inj,Quad PF,6+ Mos 02/10/2017  . Influenza-Unspecified 02/17/2013, 01/18/2014, 12/31/2014  . Pneumococcal Conjugate-13 07/15/2014  . Pneumococcal Polysaccharide-23 05/20/2010  . Tdap 07/09/2015  . Zoster 05/20/2010   Pertinent  Health Maintenance Due  Topic Date Due  . URINE MICROALBUMIN  09/25/1937  . DEXA SCAN  09/25/1992  . INFLUENZA VACCINE  12/19/2018  . PNA vac Low Risk Adult  Completed   Fall Risk  04/05/2019 11/30/2018 09/30/2018 03/12/2018 09/11/2017  Falls in the past year? 1 0 0 No No  Number falls in past yr: 0 0 0 - -  Comment - - - - -  Injury with Fall? 1 0 0 - -  Risk Factor Category  - - - - -  Risk for fall due to : - - - - -  Follow up - - - - -  Comment - - - - -    Vitals:   04/05/19 1312  BP: 138/78  Pulse: 70  Temp: (!) 97.4 F (36.3 C)  TempSrc: Temporal  Weight: 124 lb (56.2 kg)  Height: 5\' 7"  (1.702 m)   Body mass index is 19.42 kg/m. Physical Exam Vitals signs reviewed.  Constitutional:      General: She is not in acute distress.    Appearance: She is normal weight. She is not ill-appearing.  HENT:     Head: Normocephalic.  Eyes:     General: No scleral icterus.       Right eye: No discharge.        Left eye: No discharge.     Conjunctiva/sclera:  Conjunctivae normal.     Pupils: Pupils are equal, round, and reactive to light.  Cardiovascular:     Rate and Rhythm: Normal rate. Rhythm irregular.     Pulses: Normal pulses.     Heart sounds: Normal heart sounds. No murmur. No friction rub. No gallop.   Pulmonary:     Effort: Pulmonary effort is normal. No respiratory distress.     Breath sounds: Normal breath sounds. No wheezing, rhonchi or rales.  Chest:     Chest wall: No tenderness.  Abdominal:     General: Bowel sounds are normal. There is no distension.     Palpations: Abdomen is soft. There is no mass.     Tenderness: There is no abdominal tenderness. There is no right CVA tenderness, guarding or rebound.  Musculoskeletal:     Right ankle: She exhibits swelling. She exhibits  normal range of motion and no ecchymosis.     Right lower leg: Edema present.     Left lower leg: Edema present.       Feet:     Comments: Unsteady gait on wheelchair during visit.bilateral lower extremities 1-2+ edema worst on right ankle/foot.  Skin:    General: Skin is warm and dry.     Coloration: Skin is not pale.     Findings: No bruising, erythema or rash.  Neurological:     Mental Status: She is alert. Mental status is at baseline.     Cranial Nerves: No cranial nerve deficit.     Sensory: No sensory deficit.     Motor: No weakness.     Coordination: Coordination normal.     Gait: Gait abnormal.  Psychiatric:        Mood and Affect: Mood normal.        Behavior: Behavior normal.        Thought Content: Thought content normal.        Judgment: Judgment normal.    Labs reviewed: Recent Labs    12/04/18 1022  NA 143  K 3.9  CL 104  CO2 29  GLUCOSE 78  BUN 27*  CREATININE 1.71*  CALCIUM 9.8   Recent Labs    12/04/18 1022  AST 21  ALT 12  BILITOT 0.6  PROT 6.6   Recent Labs    12/04/18 1022  WBC 6.2  NEUTROABS 4,706  HGB 15.1  HCT 45.8*  MCV 92.7  PLT 194   Lab Results  Component Value Date   TSH 2.770  09/04/2015   Lab Results  Component Value Date   HGBA1C 6.1 (H) 12/04/2018   Lab Results  Component Value Date   CHOL 151 09/04/2015   HDL 85 09/04/2015   LDLCALC 53 09/04/2015   TRIG 66 09/04/2015   CHOLHDL 1.8 09/04/2015    Significant Diagnostic Results in last 30 days:  Dg Foot Complete Right  Result Date: 04/02/2019 CLINICAL DATA:  Fall. RIGHT heel pain EXAM: RIGHT FOOT COMPLETE - 3+ VIEW COMPARISON:  None. FINDINGS: No fracture or dislocation of mid foot or forefoot. The phalanges are normal. The calcaneus is normal. No soft tissue abnormality. Hallux valgus deformity of the first ray. IMPRESSION: 1. No acute osseous abnormality. 2. Hallux valgus deformity of the first ray. Electronically Signed   By: Suzy Bouchard M.D.   On: 04/02/2019 15:29    Assessment/Plan  1. Right foot pain Reports near fall episode got herself on the bed.unclear if foot was got on the bed or something else.No laceration.lateral foot more tender to palpation.X-ray negative for fracture. - CBC with Differential/Platelet - Uric Acid - Sedimentation Rate  2. Swelling of right foot Has 1-2 pitting edema.More swelling on the ankle will rule out gout.No redness or increase warmth. - CBC with Differential/Platelet - Uric Acid - Sedimentation Rate  3. Bilateral leg edema Chronic.has compression stockings but has not been wearing.encouraged to keep legs elevated and wear Ted hose on in the morning and off at bedtime.   Family/ staff Communication: Reviewed plan of care with patient.   Labs/tests ordered:  - CBC with Differential/Platelet - Uric Acid - Sedimentation Rate  Sayda Grable C Akelia Husted, NP

## 2019-04-05 NOTE — Patient Instructions (Signed)
1.Take Extra strength tylenol 500 mg tablet take two tablets by mouth twice daily in the morning and at bedtime.Also Take 500 mg tablet daily at 2 pm for pain.   2. Keep leg elevated and wear knee high ted hose daily and off at bedtime.

## 2019-04-06 ENCOUNTER — Telehealth: Payer: Self-pay

## 2019-04-06 LAB — CBC WITH DIFFERENTIAL/PLATELET
Absolute Monocytes: 509 cells/uL (ref 200–950)
Basophils Absolute: 32 cells/uL (ref 0–200)
Basophils Relative: 0.6 %
Eosinophils Absolute: 58 cells/uL (ref 15–500)
Eosinophils Relative: 1.1 %
HCT: 40.6 % (ref 35.0–45.0)
Hemoglobin: 13.5 g/dL (ref 11.7–15.5)
Lymphs Abs: 668 cells/uL — ABNORMAL LOW (ref 850–3900)
MCH: 30.8 pg (ref 27.0–33.0)
MCHC: 33.3 g/dL (ref 32.0–36.0)
MCV: 92.5 fL (ref 80.0–100.0)
MPV: 11.6 fL (ref 7.5–12.5)
Monocytes Relative: 9.6 %
Neutro Abs: 4033 cells/uL (ref 1500–7800)
Neutrophils Relative %: 76.1 %
Platelets: 211 10*3/uL (ref 140–400)
RBC: 4.39 10*6/uL (ref 3.80–5.10)
RDW: 13.4 % (ref 11.0–15.0)
Total Lymphocyte: 12.6 %
WBC: 5.3 10*3/uL (ref 3.8–10.8)

## 2019-04-06 LAB — SEDIMENTATION RATE: Sed Rate: 38 mm/h — ABNORMAL HIGH (ref 0–30)

## 2019-04-06 LAB — URIC ACID: Uric Acid, Serum: 8 mg/dL — ABNORMAL HIGH (ref 2.5–7.0)

## 2019-04-06 MED ORDER — PREDNISONE 10 MG PO TABS: ORAL_TABLET | ORAL | 0 refills | Status: DC

## 2019-04-06 NOTE — Telephone Encounter (Signed)
-----   Message from Sandrea Hughs, NP sent at 04/06/2019 11:13 AM EST ----- 1.CBC indicates no signs of infection or anemia. 2.uric acid and sed rate are high indicates gout.start on Tapered Prednisone 10 mg tablet Take two tablets by mouth x 1 dose then take 10 mg tablet daily x 1 then 5 mg tablet x 1 day and stop.Take with food.

## 2019-04-06 NOTE — Telephone Encounter (Signed)
Discussed results with patient, patient verbalized understanding of results.   Message forwarded to Dinah to send rx for prednisone taper pack to the pharmacy. I attempted to add taper pack and was presented with too many options.

## 2019-04-08 ENCOUNTER — Ambulatory Visit: Payer: Self-pay | Admitting: Internal Medicine

## 2019-04-12 ENCOUNTER — Ambulatory Visit (INDEPENDENT_AMBULATORY_CARE_PROVIDER_SITE_OTHER): Payer: Medicare Other | Admitting: Internal Medicine

## 2019-04-12 ENCOUNTER — Other Ambulatory Visit: Payer: Self-pay

## 2019-04-12 ENCOUNTER — Encounter: Payer: Self-pay | Admitting: Internal Medicine

## 2019-04-12 DIAGNOSIS — R739 Hyperglycemia, unspecified: Secondary | ICD-10-CM | POA: Diagnosis not present

## 2019-04-12 DIAGNOSIS — G609 Hereditary and idiopathic neuropathy, unspecified: Secondary | ICD-10-CM

## 2019-04-12 DIAGNOSIS — M79671 Pain in right foot: Secondary | ICD-10-CM | POA: Diagnosis not present

## 2019-04-12 DIAGNOSIS — N184 Chronic kidney disease, stage 4 (severe): Secondary | ICD-10-CM

## 2019-04-12 DIAGNOSIS — Z66 Do not resuscitate: Secondary | ICD-10-CM

## 2019-04-12 DIAGNOSIS — M10371 Gout due to renal impairment, right ankle and foot: Secondary | ICD-10-CM

## 2019-04-12 DIAGNOSIS — I10 Essential (primary) hypertension: Secondary | ICD-10-CM

## 2019-04-12 MED ORDER — ALLOPURINOL 100 MG PO TABS: 100.0000 mg | ORAL_TABLET | Freq: Every day | ORAL | 3 refills | Status: DC

## 2019-04-12 NOTE — Patient Instructions (Addendum)
Start back on low dose allopurinol therapy. We will recheck your uric acid level next time we meet in 4 months.

## 2019-04-12 NOTE — Progress Notes (Signed)
Patient ID: Mia [name], female   DOB: 07/10/1925, 85 y.o.   MRN: [id number] This service is provided via telemedicine  No vital signs collected/recorded due to the encounter was a telemedicine visit.   Location of patient (ex: home, work):  HOME  Patient consents to a telephone visit:  YES  Location of the provider (ex: office, home):  OFFICE  Name of any referring provider: DR [facility] Karmyn Lowman, DO  Names of all persons participating in the telemedicine service and their role in the encounter:  PATIENT, [place], [place], DO  Time spent on call:  3:25    Provider:  Lakelyn Straus L. Mariea Clonts, D.O., C.M.D.  Code Status: DNR Goals of Care:  Advanced Directives 01/05/2019  Does Patient Have a Medical Advance Directive? Yes  Type of Paramedic of [place];Living will  Does patient want to make changes to medical advance directive? No - Patient declined  Copy of [place] in Chart? No - copy requested  Pre-existing out of facility DNR order (yellow form or pink MOST form) -     Chief Complaint  Patient presents with  . Medical Management of Chronic Issues    4MTH FOLLOW-UP    HPI: Patient is a 85 y.o. female seen today for medical management of chronic diseases.    Turned out she had gout in her foot.  She had been on allopurinol before and had side effects.  She is still having some pain and stiffness.    She had her flu shot at her facility.    She is feeling better psychologically.  Says she sees the light at the end of the tunnel.  She read a book called "I ain't well but I sure am better".  She has feelings that she will fall in any direction.  She stays cold a lot.  Her little fingers turn really purple when she gets cold--Raynaud's.   She uses her walker.  She's trying to get an electric chair to ride around through the [place].   She is eating well and she's hydrating well.  Her daughter and son in law bring food and she gets to  order things in and has backup of eating at her facility.    Knows she is way overdue for bone density, but has not followed through with that.    She has considered going back to some therapy. She may consider that after Christmas.    Past Medical History:  Diagnosis Date  . Arthritis    oa  . Chronic kidney disease    chronic kidney diseaase follow by primary md  . Diabetes mellitus without complication ([place])    Diet controlled  . Former smoker   . H/O ventricular fibrillation 10 yrs ago dx  . Hypertension   . Prediabetes   . Seasonal allergies   . TIA (transient ischemic attack) none recent    Past Surgical History:  Procedure Laterality Date  . NM MYOCAR PERF WALL MOTION  01/2008   lexiscan myoview - normal pattern of perfusion in all regions, EF 92%, no significant ischemia demonstated  . TONSILLECTOMY  age 61   and adenoids  . TOTAL KNEE ARTHROPLASTY Right 10/06/2015   Procedure: RIGHT TOTAL KNEE ARTHROPLASTY;  Surgeon: [name], MD;  Location: [facility];  Service: Orthopedics;  Laterality: Right;  . TRANSTHORACIC ECHOCARDIOGRAM  12/2005   mild DUST, EF=>55%; mild mitral annular calcif; mild-mod aortic root calcif    No Known Allergies  Outpatient Encounter Medications as of 04/12/2019  Medication Sig  . Ascorbic Acid (VITAMIN C) 1000 MG tablet Take 1,000 mg by mouth daily.  . cholecalciferol (VITAMIN D) 1000 UNITS tablet Take 1,000 Units by mouth daily. D3  . clopidogrel (PLAVIX) 75 MG tablet TAKE 1 TABLET(75 MG) BY MOUTH DAILY  . hydrochlorothiazide (HYDRODIURIL) 12.5 MG tablet TAKE 1 TABLET BY MOUTH EVERY OTHER DAY  . ketorolac (ACULAR) 0.5 % ophthalmic solution Place 1 drop into the right eye 2 (two) times daily.  . metoprolol succinate (TOPROL-XL) 25 MG 24 hr tablet Take one tablet by mouth once daily  . potassium chloride (K-DUR) 10 MEQ tablet Take 1 tablet (10 mEq total) by mouth daily.  . [DISCONTINUED] predniSONE (DELTASONE) 10 MG tablet prednisone  10 mg Tablet Take two tablets ( 20 mg ) by mouth x 1 dose then  Take 10 mg tablet one by mouth x 1 dose then  Take 5 mg tablet one by mouth x 1 dose and stop.Take prednisone with food.   No facility-administered encounter medications on file as of 04/12/2019.     Review of Systems:  Review of Systems  Constitutional: Negative for chills, fever and malaise/fatigue.  HENT: Negative for congestion.   Eyes: Negative for blurred vision.  Respiratory: Negative for cough and shortness of breath.   Cardiovascular: Negative for chest pain, palpitations and leg swelling.  Gastrointestinal: Negative for abdominal pain, blood in stool, constipation, diarrhea and melena.  Genitourinary: Negative for dysuria.  Musculoskeletal: Positive for falls and joint pain.  Skin: Negative for itching and rash.  Neurological: Negative for dizziness and weakness.       Unsteady gait--feels she could fall any direction  Endo/Heme/Allergies: Bruises/bleeds easily.  Psychiatric/Behavioral: Negative for depression and memory loss. The patient is not nervous/anxious and does not have insomnia.     Health Maintenance  Topic Date Due  . URINE MICROALBUMIN  09/25/1937  . DEXA SCAN  09/25/1992  . INFLUENZA VACCINE  12/19/2018  . TETANUS/TDAP  07/08/2025  . PNA vac Low Risk Adult  Completed    Physical Exam: Could not be performed as visit non face-to-face via phone   Labs reviewed: Basic Metabolic Panel: Recent Labs    12/04/18 1022  NA 143  K 3.9  CL 104  CO2 29  GLUCOSE 78  BUN 27*  CREATININE 1.71*  CALCIUM 9.8   Liver Function Tests: Recent Labs    12/04/18 1022  AST 21  ALT 12  BILITOT 0.6  PROT 6.6   No results for input(s): LIPASE, AMYLASE in the last 8760 hours. No results for input(s): AMMONIA in the last 8760 hours. CBC: Recent Labs    12/04/18 1022 04/05/19 1352  WBC 6.2 5.3  NEUTROABS 4,706 4,033  HGB 15.1 13.5  HCT 45.8* 40.6  MCV 92.7 92.5  PLT 194 211   Lipid  Panel: No results for input(s): CHOL, HDL, LDLCALC, TRIG, CHOLHDL, LDLDIRECT in the last 8760 hours. Lab Results  Component Value Date   HGBA1C 6.1 (H) 12/04/2018    Procedures since last visit: Dg Foot Complete Right  Result Date: 04/02/2019 CLINICAL DATA:  Fall. RIGHT heel pain EXAM: RIGHT FOOT COMPLETE - 3+ VIEW COMPARISON:  None. FINDINGS: No fracture or dislocation of mid foot or forefoot. The phalanges are normal. The calcaneus is normal. No soft tissue abnormality. Hallux valgus deformity of the first ray. IMPRESSION: 1. No acute osseous abnormality. 2. Hallux valgus deformity of the first ray. Electronically Signed   By:  Suzy Bouchard M.D.   On: 04/02/2019 15:29    Assessment/Plan 1. Acute gout due to renal impairment involving toe of right foot -improved with prednisone therapy - resume allopurinol (ZYLOPRIM) 100 MG tablet; Take 1 tablet (100 mg total) by mouth daily.  Dispense: 90 tablet; Refill: 3 - Uric Acid; Future  2. Right foot pain - improving after prednisone taper - Uric Acid; Future  3. CKD (chronic kidney disease) stage 4, GFR 15-29 ml/min (HCC) -Avoid nephrotoxic agents like nsaids, dose adjust renally excreted meds, hydrate. - COMPLETE METABOLIC PANEL WITH GFR; Future  4. Hyperglycemia - has been under good control - CBC with Differential/Platelet; Future - COMPLETE METABOLIC PANEL WITH GFR; Future - Hemoglobin A1c; Future - Microalbumin / creatinine urine ratio; Future  5. Essential hypertension, benign -bp well controlled on current regimen  6. Hereditary and idiopathic peripheral neuropathy -did not tolerate gabapentin due to fatigue and already unsteady gait -she may consider PT in the new year  Labs/tests ordered:  Lab Orders     CBC with Differential/Platelet     COMPLETE METABOLIC PANEL WITH GFR     Hemoglobin A1c     Microalbumin / creatinine urine ratio     Uric Acid  Next appt:  07/16/2019  Non face-to-face time spent on televisit:   28 mins  Lindzey Zent L. Nalina Yeatman, D.O. [facility] [address], [place] [address] Cell Phone (Mon-Fri 8am-5pm):  [phone] On Call:  [phone] & follow prompts after 5pm & weekends Office Phone:  [phone] Office Fax:  [phone]

## 2019-05-24 ENCOUNTER — Ambulatory Visit: Payer: Medicare Other | Admitting: Podiatry

## 2019-05-27 DIAGNOSIS — M6281 Muscle weakness (generalized): Secondary | ICD-10-CM | POA: Diagnosis not present

## 2019-05-27 DIAGNOSIS — R2681 Unsteadiness on feet: Secondary | ICD-10-CM | POA: Diagnosis not present

## 2019-06-02 DIAGNOSIS — M6281 Muscle weakness (generalized): Secondary | ICD-10-CM | POA: Diagnosis not present

## 2019-06-02 DIAGNOSIS — R2681 Unsteadiness on feet: Secondary | ICD-10-CM | POA: Diagnosis not present

## 2019-06-04 DIAGNOSIS — R2681 Unsteadiness on feet: Secondary | ICD-10-CM | POA: Diagnosis not present

## 2019-06-04 DIAGNOSIS — M6281 Muscle weakness (generalized): Secondary | ICD-10-CM | POA: Diagnosis not present

## 2019-06-08 DIAGNOSIS — M6281 Muscle weakness (generalized): Secondary | ICD-10-CM | POA: Diagnosis not present

## 2019-06-08 DIAGNOSIS — R2681 Unsteadiness on feet: Secondary | ICD-10-CM | POA: Diagnosis not present

## 2019-06-11 DIAGNOSIS — M6281 Muscle weakness (generalized): Secondary | ICD-10-CM | POA: Diagnosis not present

## 2019-06-11 DIAGNOSIS — R2681 Unsteadiness on feet: Secondary | ICD-10-CM | POA: Diagnosis not present

## 2019-06-15 DIAGNOSIS — M6281 Muscle weakness (generalized): Secondary | ICD-10-CM | POA: Diagnosis not present

## 2019-06-15 DIAGNOSIS — R2681 Unsteadiness on feet: Secondary | ICD-10-CM | POA: Diagnosis not present

## 2019-06-17 DIAGNOSIS — R2681 Unsteadiness on feet: Secondary | ICD-10-CM | POA: Diagnosis not present

## 2019-06-17 DIAGNOSIS — M6281 Muscle weakness (generalized): Secondary | ICD-10-CM | POA: Diagnosis not present

## 2019-06-22 DIAGNOSIS — H524 Presbyopia: Secondary | ICD-10-CM | POA: Diagnosis not present

## 2019-06-22 DIAGNOSIS — Z961 Presence of intraocular lens: Secondary | ICD-10-CM | POA: Diagnosis not present

## 2019-06-22 DIAGNOSIS — H4063X0 Glaucoma secondary to drugs, bilateral, stage unspecified: Secondary | ICD-10-CM | POA: Diagnosis not present

## 2019-06-22 DIAGNOSIS — H353131 Nonexudative age-related macular degeneration, bilateral, early dry stage: Secondary | ICD-10-CM | POA: Diagnosis not present

## 2019-06-22 DIAGNOSIS — E119 Type 2 diabetes mellitus without complications: Secondary | ICD-10-CM | POA: Diagnosis not present

## 2019-06-22 LAB — HM DIABETES EYE EXAM

## 2019-06-24 DIAGNOSIS — M6281 Muscle weakness (generalized): Secondary | ICD-10-CM | POA: Diagnosis not present

## 2019-06-24 DIAGNOSIS — R2681 Unsteadiness on feet: Secondary | ICD-10-CM | POA: Diagnosis not present

## 2019-06-28 ENCOUNTER — Encounter: Payer: Self-pay | Admitting: Internal Medicine

## 2019-06-30 DIAGNOSIS — M6281 Muscle weakness (generalized): Secondary | ICD-10-CM | POA: Diagnosis not present

## 2019-06-30 DIAGNOSIS — R2681 Unsteadiness on feet: Secondary | ICD-10-CM | POA: Diagnosis not present

## 2019-07-01 ENCOUNTER — Other Ambulatory Visit: Payer: Self-pay | Admitting: Internal Medicine

## 2019-07-01 DIAGNOSIS — I10 Essential (primary) hypertension: Secondary | ICD-10-CM

## 2019-07-01 DIAGNOSIS — R600 Localized edema: Secondary | ICD-10-CM

## 2019-07-02 DIAGNOSIS — M6281 Muscle weakness (generalized): Secondary | ICD-10-CM | POA: Diagnosis not present

## 2019-07-02 DIAGNOSIS — R2681 Unsteadiness on feet: Secondary | ICD-10-CM | POA: Diagnosis not present

## 2019-07-07 DIAGNOSIS — R2681 Unsteadiness on feet: Secondary | ICD-10-CM | POA: Diagnosis not present

## 2019-07-07 DIAGNOSIS — M6281 Muscle weakness (generalized): Secondary | ICD-10-CM | POA: Diagnosis not present

## 2019-07-09 DIAGNOSIS — M6281 Muscle weakness (generalized): Secondary | ICD-10-CM | POA: Diagnosis not present

## 2019-07-09 DIAGNOSIS — R2681 Unsteadiness on feet: Secondary | ICD-10-CM | POA: Diagnosis not present

## 2019-07-12 DIAGNOSIS — R2681 Unsteadiness on feet: Secondary | ICD-10-CM | POA: Diagnosis not present

## 2019-07-12 DIAGNOSIS — M6281 Muscle weakness (generalized): Secondary | ICD-10-CM | POA: Diagnosis not present

## 2019-07-15 DIAGNOSIS — R2681 Unsteadiness on feet: Secondary | ICD-10-CM | POA: Diagnosis not present

## 2019-07-15 DIAGNOSIS — M6281 Muscle weakness (generalized): Secondary | ICD-10-CM | POA: Diagnosis not present

## 2019-07-16 ENCOUNTER — Ambulatory Visit: Payer: Medicare PPO | Admitting: Internal Medicine

## 2019-07-16 DIAGNOSIS — M6281 Muscle weakness (generalized): Secondary | ICD-10-CM | POA: Diagnosis not present

## 2019-07-16 DIAGNOSIS — R2681 Unsteadiness on feet: Secondary | ICD-10-CM | POA: Diagnosis not present

## 2019-07-20 DIAGNOSIS — M6281 Muscle weakness (generalized): Secondary | ICD-10-CM | POA: Diagnosis not present

## 2019-07-20 DIAGNOSIS — R2681 Unsteadiness on feet: Secondary | ICD-10-CM | POA: Diagnosis not present

## 2019-07-21 DIAGNOSIS — R2681 Unsteadiness on feet: Secondary | ICD-10-CM | POA: Diagnosis not present

## 2019-07-21 DIAGNOSIS — M6281 Muscle weakness (generalized): Secondary | ICD-10-CM | POA: Diagnosis not present

## 2019-07-22 ENCOUNTER — Ambulatory Visit: Payer: Medicare PPO | Admitting: Internal Medicine

## 2019-07-23 DIAGNOSIS — M6281 Muscle weakness (generalized): Secondary | ICD-10-CM | POA: Diagnosis not present

## 2019-07-23 DIAGNOSIS — R2681 Unsteadiness on feet: Secondary | ICD-10-CM | POA: Diagnosis not present

## 2019-07-26 DIAGNOSIS — R2681 Unsteadiness on feet: Secondary | ICD-10-CM | POA: Diagnosis not present

## 2019-07-26 DIAGNOSIS — M6281 Muscle weakness (generalized): Secondary | ICD-10-CM | POA: Diagnosis not present

## 2019-07-27 ENCOUNTER — Telehealth: Payer: Self-pay | Admitting: Internal Medicine

## 2019-07-27 ENCOUNTER — Ambulatory Visit (INDEPENDENT_AMBULATORY_CARE_PROVIDER_SITE_OTHER): Payer: Medicare PPO

## 2019-07-27 ENCOUNTER — Other Ambulatory Visit: Payer: Self-pay

## 2019-07-27 ENCOUNTER — Ambulatory Visit: Payer: Medicare PPO | Admitting: Podiatry

## 2019-07-27 ENCOUNTER — Encounter: Payer: Self-pay | Admitting: Podiatry

## 2019-07-27 DIAGNOSIS — B351 Tinea unguium: Secondary | ICD-10-CM | POA: Diagnosis not present

## 2019-07-27 DIAGNOSIS — M79676 Pain in unspecified toe(s): Secondary | ICD-10-CM | POA: Diagnosis not present

## 2019-07-27 DIAGNOSIS — M7989 Other specified soft tissue disorders: Secondary | ICD-10-CM

## 2019-07-27 DIAGNOSIS — G629 Polyneuropathy, unspecified: Secondary | ICD-10-CM

## 2019-07-27 DIAGNOSIS — L84 Corns and callosities: Secondary | ICD-10-CM

## 2019-07-27 DIAGNOSIS — M84375A Stress fracture, left foot, initial encounter for fracture: Secondary | ICD-10-CM | POA: Diagnosis not present

## 2019-07-27 NOTE — Telephone Encounter (Signed)
Left message for Mia [name] to call back to reschedule her 08/20/2019 appt. with Dr. Mariea Clonts.Dr. Mariea Clonts states that Dr. Elisha Ponder wants her seen sooner because of the swelling in her feet/legs. appt. can be made with Dr. Sharee Holster or [facility]

## 2019-07-27 NOTE — Progress Notes (Signed)
Subjective: Mia [name] presents today for follow up of corn(s) right 4th digit, left 5th digit and callus(es) plantar right foot and 1st metatarsal right foot and painful mycotic nails b/l.  Pain interferes with ambulation. Aggravating factors include wearing enclosed shoe gear.   She also has h/o gout and CKD, stage 4.   She is complaining of painful corn right 4th toe. She also has left lower extremity swelling. Swelling has been present about 2 weeks. She denies any falls/trauma.   No Known Allergies   Objective: There were no vitals filed for this visit.  Pt in NAD. AAO x 3.  Vascular Examination:  Capillary refill time to digits immediate b/l. Palpable DP pulses b/l. Palpable PT pulses b/l. Pedal hair sparse b/l. Skin temperature gradient within normal limits b/l. Unilateral edema noted LLE. +1-2. No pain with calf compression b/l.        Dermatological Examination: Pedal skin with normal turgor, texture and tone bilaterally. No open wounds bilaterally. No interdigital macerations bilaterally. Toenails 1-5 b/l elongated, dystrophic, thickened, crumbly with subungual debris and tenderness to dorsal palpation. Hyperkeratotic lesion(s) b/l 5th digit, right 4th digit, right 1st metatarsal head and submet heads 3, 4 right foot.  No erythema, no edema, no drainage, no flocculence.   Musculoskeletal: Normal muscle strength 5/5 to all lower extremity muscle groups bilaterally, no pain crepitus or joint limitation noted with ROM b/l, bunion deformity noted b/l and pain on palpation dorsal aspect of metatarsals 2 and 3 left foot  Neurological: Protective sensation diminished with 10g monofilament b/l.  Xray findings left foot/ankle: no gas in tissues, soft tissue swelling present left foot/ankle, cortical thickening noted along left 2nd and 3rd metatarsal(s) shaft and erosive arthropathy consistent with chronic gout left 1st metatarsophalangeal joint.  Assessment: 1. Pain due to  onychomycosis of toenail   2. Stress fracture of metatarsal bone of left foot, initial encounter   3. Corns and callosities   4. Left leg swelling   5. Neuropathy    Plan: -Toenails 1-5 b/l were debrided in length and girth with sterile nail nippers and dremel without iatrogenic bleeding.  -Corn(s) debrided b/l 5th digits and right 4th digit without complication or incident. Total number debrided=3. -Calluses submet head 3, 4 right were debrided without complication or incident. Total number debrided =2. -Xray of left foot was performed and reviewed in office with patient and/or POA. Repeat xray left foot on next visit. -Surgical shoe was dispensed for left foot. She is to wear daily until next visit.  -Patient instructed to follow up with Dr. Mariea Clonts for left lower extremity swelling. I have contacted Dr. Mariea Clonts and informed her.  I have also asked Ms. Heenan to see her Cardiologist. -Patient/POA to call should there be question/concern in the interim.  Return in about 2 weeks (around 08/10/2019) for x-rays/stress fracture left.

## 2019-07-27 NOTE — Patient Instructions (Addendum)
Mia [name],  Please call Dr. Cyndi Lennert office regarding your leg swelling. You also should see your Cardiologist. I will see you in 2 weeks for repeat xrays of left foot.   Stress Fracture  A stress fracture is a small break or crack in a bone. A stress fracture can be fully broken (complete) or partially broken (incomplete). The most common sites for stress fractures are the bones in the front of your feet (metatarsals), your heel (calcaneus), and the long bone of your lower leg (tibia). What are the causes? This condition is caused by overuse or repetitive exercise, such as running. It happens when a bone cannot absorb any more shock because the muscles around it are weak. Stress fractures happen most commonly when:  You rapidly increase or start a new physical activity.  You use shoes that are worn out or do not fit properly.  You exercise on a new surface. What increases the risk? You are more likely to develop this condition if:  You have a condition that causes weak bones (osteoporosis).  You are female. Stress fractures are more likely to occur in women. What are the signs or symptoms? The most common symptom of a stress fracture is feeling pain when you are using or putting weight on the affected part of your body. The pain usually improves when you are resting. Other symptoms may include:  Swelling of the affected area.  Pain in the area when it is touched. Stress fracture pain usually develops over time. How is this diagnosed? This condition may be diagnosed by:  Your symptoms.  Your medical history.  A physical exam.  Imaging tests, such as: ? X-rays. ? MRI. ? Bone scan. How is this treated? Treatment depends on the severity of your stress fracture. It is commonly treated with resting, icing, compression, and elevation (RICE therapy). Treatment may also include:  Medicines to reduce inflammation.  A cast or a walking shoe.  Crutches.  Surgery. This is usually  only in severe cases. Follow these instructions at home: If you have a cast:  Do not put pressure on any part of the cast until it is fully hardened. This may take several hours.  Do not stick anything inside the cast to scratch your skin. Doing that increases your risk of infection.  Check the skin around the cast every day. Tell your health care provider about any concerns.  You may put lotion on dry skin around the edges of the cast. Do not put lotion on the skin underneath the cast.  Keep the cast clean.  If the cast is not waterproof: ? Do not let it get wet. ? Cover it with a watertight covering when you take a bath or a shower. If you have a walking shoe:  Wear the shoe as told by your health care provider. Remove it only as told by your health care provider.  Loosen the shoe if your toes tingle, become numb, or turn cold and blue.  Keep the shoe clean.  If the shoe is not waterproof: ? Do not let it get wet. Managing pain, stiffness, and swelling   If directed, apply ice to the injured area: ? If you have a walking shoe, remove the shoe as told by your health care provider. ? Put ice in a plastic bag. ? Place a towel between your skin and the bag or between your cast and the bag. ? Leave the ice on for 20 minutes, 2-3 times per day.  Move your toes often to avoid stiffness and to lessen swelling.  Raise (elevate) the injured area above the level of your heart while you are sitting or lying down. Activity  Rest as directed by your health care provider. Ask your health care provider if you may do alternative exercises, such as swimming or biking, while you are healing.  Return to your normal activities as directed by your health care provider. Ask your health care provider what activities are safe for you.  Perform range-of-motion exercises only as directed by your health care provider. General instructions  Do not use the injured limb to support yourbody weight  until your health care provider says that you can. Use crutches if your health care provider tells you to do so.  Do not use any products that contain nicotine or tobacco, such as cigarettes and e-cigarettes. These can delay bone healing. If you need help quitting, ask your health care provider.  Take over-the-counter and prescription medicines only as told by your health care provider.  Keep all follow-up visits as told by your health care provider. This is important. How is this prevented?  Only wear shoes that: ? Fit well. ? Are not worn out.  Eat a healthy diet that contains vitamin D and calcium. This helps keep your bones strong. Good sources of calcium and vitamin D include: ? Low-fat dairy products such as milk, yogurt, and cheese. ? Certain fish, such as fresh or canned salmon, tuna, and sardines. ? Products that have calcium and vitamin D added to them (fortified products), such as fortified cereals or juice.  Be careful when you start a new physical activity. Give your body time to adjust.  Avoid doing only one kind of activity. Do different exercises, such as swimming and running, so that no single part of your body gets overused.  Do strength-training exercises. Contact a health care provider if:  Your pain gets worse.  You have new symptoms.  You have increased swelling. Get help right away if:  You lose feeling in the injured area. Summary  A stress fracture is a small break or crack in a bone. A stress fracture can be fully broken (complete) or partially broken (incomplete).  This condition is caused by overuse or repetitive exercise, such as running.  The most common symptom of a stress fracture is feeling pain when you are using or putting weight on the affected part of your body.  Treatment depends on the severity of your stress fracture. This information is not intended to replace advice given to you by your health care provider. Make sure you discuss any  questions you have with your health care provider. Document Revised: 06/17/2017 Document Reviewed: 06/17/2017 Elsevier Patient Education  2020 [place].   Edema  Edema is an abnormal buildup of fluids in the body tissues and under the skin. Swelling of the legs, feet, and ankles is a common symptom that becomes more likely as you get older. Swelling is also common in looser tissues, like around the eyes. When the affected area is squeezed, the fluid may move out of that spot and leave a dent for a few moments. This dent is called pitting edema. There are many possible causes of edema. Eating too much salt (sodium) and being on your feet or sitting for a long time can cause edema in your legs, feet, and ankles. Hot weather may make edema worse. Common causes of edema include:  Heart failure.  Liver or kidney disease.  Weak leg blood vessels.  Cancer.  An injury.  Pregnancy.  Medicines.  Being obese.  Low protein levels in the blood. Edema is usually painless. Your skin may look swollen or shiny. Follow these instructions at home:  Keep the affected body part raised (elevated) above the level of your heart when you are sitting or lying down.  Do not sit still or stand for long periods of time.  Do not wear tight clothing. Do not wear garters on your upper legs.  Exercise your legs to get your circulation going. This helps to move the fluid back into your blood vessels, and it may help the swelling go down.  Wear elastic bandages or support stockings to reduce swelling as told by your health care provider.  Eat a low-salt (low-sodium) diet to reduce fluid as told by your health care provider.  Depending on the cause of your swelling, you may need to limit how much fluid you drink (fluid restriction).  Take over-the-counter and prescription medicines only as told by your health care provider. Contact a health care provider if:  Your edema does not get better with  treatment.  You have heart, liver, or kidney disease and have symptoms of edema.  You have sudden and unexplained weight gain. Get help right away if:  You develop shortness of breath or chest pain.  You cannot breathe when you lie down.  You develop pain, redness, or warmth in the swollen areas.  You have heart, liver, or kidney disease and suddenly get edema.  You have a fever and your symptoms suddenly get worse. Summary  Edema is an abnormal buildup of fluids in the body tissues and under the skin.  Eating too much salt (sodium) and being on your feet or sitting for a long time can cause edema in your legs, feet, and ankles.  Keep the affected body part raised (elevated) above the level of your heart when you are sitting or lying down. This information is not intended to replace advice given to you by your health care provider. Make sure you discuss any questions you have with your health care provider. Document Revised: 09/23/2018 Document Reviewed: 06/08/2016 Elsevier Patient Education  [place].

## 2019-07-30 ENCOUNTER — Ambulatory Visit (HOSPITAL_COMMUNITY)
Admission: RE | Admit: 2019-07-30 | Discharge: 2019-07-30 | Disposition: A | Discharge status: Home / Self Care | Payer: Medicare PPO | Source: Ambulatory Visit | Attending: Internal Medicine | Admitting: Internal Medicine

## 2019-07-30 ENCOUNTER — Telehealth: Payer: Self-pay | Admitting: *Deleted

## 2019-07-30 ENCOUNTER — Other Ambulatory Visit: Payer: Self-pay

## 2019-07-30 ENCOUNTER — Ambulatory Visit (INDEPENDENT_AMBULATORY_CARE_PROVIDER_SITE_OTHER): Payer: Medicare PPO | Admitting: Internal Medicine

## 2019-07-30 ENCOUNTER — Encounter: Payer: Self-pay | Admitting: Internal Medicine

## 2019-07-30 VITALS — BP 138/82 | HR 69 | Temp 97.5°F | Ht 67.0 in | Wt 114.0 lb

## 2019-07-30 DIAGNOSIS — R0600 Dyspnea, unspecified: Secondary | ICD-10-CM | POA: Diagnosis not present

## 2019-07-30 DIAGNOSIS — M7989 Other specified soft tissue disorders: Secondary | ICD-10-CM

## 2019-07-30 DIAGNOSIS — M79662 Pain in left lower leg: Secondary | ICD-10-CM

## 2019-07-30 DIAGNOSIS — R0609 Other forms of dyspnea: Secondary | ICD-10-CM

## 2019-07-30 NOTE — Telephone Encounter (Signed)
Great, thank you.  I just responded to the report, as well.

## 2019-07-30 NOTE — Patient Instructions (Signed)
Follow-up with Dr. Debara Pickett for cardiology reassessment.    We'll get you in for an ultrasound of your left leg to rule out DVT.

## 2019-07-30 NOTE — Telephone Encounter (Signed)
FYICaryl Pina with [facility] [phone] called and stated that patient's test showed: No evidence of blood clot in Left leg.

## 2019-07-30 NOTE — Progress Notes (Signed)
Location:  [facility] clinic Provider: Eisa Necaise L. Mariea Clonts, D.O., C.M.D.  Code Status: DNR Goals of Care:  Advanced Directives 07/30/2019  Does Patient Have a Medical Advance Directive? Yes  Type of Advance Directive Out of facility DNR (pink MOST or yellow form)  Does patient want to make changes to medical advance directive? No - Patient declined  Copy of [place] in Chart? -  Pre-existing out of facility DNR order (yellow form or pink MOST form) -     Chief Complaint  Patient presents with  . Acute Visit    foot injury     HPI: Patient is a 86 y.o. [name] seen today for an acute visit for  She had been going to PT for her balance and thinks she jumped up and down too much.  She has stress fxs in left foot.  She says it was much more swollen at her podiatry appt which prompted this visit today.  Foot is most painful at top of arch above first two toes. Leg bothers her to midshin especially at night. She has a little more shortness of breath than usual.  She notices it if she does anything to exert herself.  Not during adls, but will be sob with PT.  Does recover right away when she stops.  She did have a period of decreased activity prior to doing her PT due to quarantine scenarios where she lives.    Reviewed notes and coordinated care with podiatry and cardiology.  She is cold and has Raynaud's.  Fingers never detect great O2, but it was really low this morning in the 60s and went up 79%.    BP at goal.  Had been high at podiatry.    [place] [employer] in [place].  She plans to keep coming here.  They do have transportation to bring her to appts.      She is 10 lbs lighter than Nov.  She sometimes does not take time to eat. She still does her own cooking.  She says she needs to be better about that.  She does not eat breakfast due to sleeping late.  She is going to add higher protein and calorie dense foods.  She does sometimes eat  in the dining room, but food is saltier than she'd prefer.    Past Medical History:  Diagnosis Date  . Arthritis    oa  . Chronic kidney disease    chronic kidney diseaase follow by primary md  . Diabetes mellitus without complication ([place])    Diet controlled  . Former smoker   . H/O ventricular fibrillation 10 yrs ago dx  . Hypertension   . Prediabetes   . Seasonal allergies   . TIA (transient ischemic attack) none recent    Past Surgical History:  Procedure Laterality Date  . NM MYOCAR PERF WALL MOTION  01/2008   lexiscan myoview - normal pattern of perfusion in all regions, EF 92%, no significant ischemia demonstated  . TONSILLECTOMY  age 9   and adenoids  . TOTAL KNEE ARTHROPLASTY Right 10/06/2015   Procedure: RIGHT TOTAL KNEE ARTHROPLASTY;  Surgeon: [name], MD;  Location: [facility];  Service: Orthopedics;  Laterality: Right;  . TRANSTHORACIC ECHOCARDIOGRAM  12/2005   mild DUST, EF=>55%; mild mitral annular calcif; mild-mod aortic root calcif    No Known Allergies  Outpatient Encounter Medications as of 07/30/2019  Medication Sig  . Ascorbic Acid (VITAMIN C)  1000 MG tablet Take 1,000 mg by mouth daily.  . cholecalciferol (VITAMIN D) 1000 UNITS tablet Take 1,000 Units by mouth daily. D3  . clopidogrel (PLAVIX) 75 MG tablet TAKE 1 TABLET(75 MG) BY MOUTH DAILY  . hydrochlorothiazide (HYDRODIURIL) 12.5 MG tablet TAKE 1 TABLET BY MOUTH EVERY OTHER DAY  . ketorolac (ACULAR) 0.5 % ophthalmic solution Place 1 drop into the right eye 2 (two) times daily.  . metoprolol succinate (TOPROL-XL) 25 MG 24 hr tablet Take one tablet by mouth once daily  . potassium chloride (KLOR-CON) 10 MEQ tablet TAKE 1 TABLET(10 MEQ) BY MOUTH DAILY  . [DISCONTINUED] allopurinol (ZYLOPRIM) 100 MG tablet Take 1 tablet (100 mg total) by mouth daily.   No facility-administered encounter medications on file as of 07/30/2019.    Review of Systems:  Review of Systems  Constitutional: Negative  for chills and fever.  Respiratory: Negative for cough and shortness of breath.   Cardiovascular: Positive for leg swelling. Negative for chest pain and palpitations.  Gastrointestinal: Negative for abdominal pain.  Genitourinary: Negative for dysuria.  Musculoskeletal: Negative for falls.       Left leg pain and swelling, has known foot stress fracture  Skin:       Raynaud's disease of fingers  Neurological: Negative for dizziness and loss of consciousness.  Psychiatric/Behavioral: Positive for memory loss. Negative for depression. The patient is not nervous/anxious.     Health Maintenance  Topic Date Due  . URINE MICROALBUMIN  Never done  . DEXA SCAN  Never done  . TETANUS/TDAP  07/08/2025  . INFLUENZA VACCINE  Completed  . PNA vac Low Risk Adult  Completed    Physical Exam: There were no vitals filed for this visit. There is no height or weight on file to calculate BMI. Physical Exam Vitals reviewed.  Constitutional:      Appearance: Normal appearance.     Comments: thin  Cardiovascular:     Rate and Rhythm: Normal rate and regular rhythm.  Pulmonary:     Effort: Pulmonary effort is normal.     Breath sounds: Normal breath sounds. No wheezing, rhonchi or rales.  Abdominal:     General: Bowel sounds are normal.  Musculoskeletal:        General: Normal range of motion.     Comments: Uses rollator walker  Skin:    General: Skin is warm and dry.     Comments: Fingers were very cold upon arrival and did improve over the course of visit; slight erythema of left foot in region of stress fx; pitting edema of left lower leg, some tenderness of medial left lower leg  Neurological:     General: No focal deficit present.     Mental Status: She is alert and oriented to person, place, and time.  Psychiatric:        Mood and Affect: Mood normal.     Labs reviewed: Basic Metabolic Panel: Recent Labs    12/04/18 1022  NA 143  K 3.9  CL 104  CO2 29  GLUCOSE 78  BUN 27*   CREATININE 1.71*  CALCIUM 9.8   Liver Function Tests: Recent Labs    12/04/18 1022  AST 21  ALT 12  BILITOT 0.6  PROT 6.6   No results for input(s): LIPASE, AMYLASE in the last 8760 hours. No results for input(s): AMMONIA in the last 8760 hours. CBC: Recent Labs    12/04/18 1022 04/05/19 1352  WBC 6.2 5.3  NEUTROABS 4,706 4,033  HGB 15.1 13.5  HCT 45.8* 40.6  MCV 92.7 92.5  PLT 194 211   Lipid Panel: No results for input(s): CHOL, HDL, LDLCALC, TRIG, CHOLHDL, LDLDIRECT in the last 8760 hours. Lab Results  Component Value Date   HGBA1C 6.1 (H) 12/04/2018    Procedures since last visit: DG Foot Complete Left  Result Date: 07/27/2019 Please see detailed radiograph report in office note.   Assessment/Plan 1. Pain and swelling of left lower leg - I'm concerned she has a DVT due to her recent stress fx - VAS [place] LOWER EXTREMITY VENOUS (DVT); Future  2. DOE (dyspnea on exertion) -notes this is more prominent -did not tolerate lasix well in past with severe weight loss and declining status -has been on hctz, but edema of left leg worse for at least a month -follow-up with Dr. Debara Pickett  Labs/tests ordered: venous doppler left leg, f/u Dr. Debara Pickett  Next appt:  08/19/2019  Serina Nichter L. Lawanna Cecere, D.O. [facility] [address], [place] [address] Cell Phone (Mon-Fri 8am-5pm):  [phone] On Call:  [phone] & follow prompts after 5pm & weekends Office Phone:  [phone] Office Fax:  [phone]

## 2019-07-30 NOTE — Progress Notes (Signed)
She does not have a blood clot in the left leg.

## 2019-08-10 ENCOUNTER — Ambulatory Visit: Payer: Medicare PPO | Admitting: Podiatry

## 2019-08-19 ENCOUNTER — Ambulatory Visit: Payer: Self-pay | Admitting: Internal Medicine

## 2019-08-23 ENCOUNTER — Ambulatory Visit: Payer: Medicare PPO | Admitting: Internal Medicine

## 2019-08-25 ENCOUNTER — Encounter: Payer: Self-pay | Admitting: General Practice

## 2019-08-25 DIAGNOSIS — I890 Lymphedema, not elsewhere classified: Secondary | ICD-10-CM | POA: Diagnosis not present

## 2019-08-25 DIAGNOSIS — R296 Repeated falls: Secondary | ICD-10-CM | POA: Diagnosis not present

## 2019-08-25 DIAGNOSIS — M6281 Muscle weakness (generalized): Secondary | ICD-10-CM | POA: Diagnosis not present

## 2019-08-31 DIAGNOSIS — M6281 Muscle weakness (generalized): Secondary | ICD-10-CM | POA: Diagnosis not present

## 2019-08-31 DIAGNOSIS — I890 Lymphedema, not elsewhere classified: Secondary | ICD-10-CM | POA: Diagnosis not present

## 2019-08-31 DIAGNOSIS — R296 Repeated falls: Secondary | ICD-10-CM | POA: Diagnosis not present

## 2019-09-01 DIAGNOSIS — M6281 Muscle weakness (generalized): Secondary | ICD-10-CM | POA: Diagnosis not present

## 2019-09-01 DIAGNOSIS — R296 Repeated falls: Secondary | ICD-10-CM | POA: Diagnosis not present

## 2019-09-01 DIAGNOSIS — I890 Lymphedema, not elsewhere classified: Secondary | ICD-10-CM | POA: Diagnosis not present

## 2019-09-03 DIAGNOSIS — I890 Lymphedema, not elsewhere classified: Secondary | ICD-10-CM | POA: Diagnosis not present

## 2019-09-03 DIAGNOSIS — M6281 Muscle weakness (generalized): Secondary | ICD-10-CM | POA: Diagnosis not present

## 2019-09-03 DIAGNOSIS — R296 Repeated falls: Secondary | ICD-10-CM | POA: Diagnosis not present

## 2019-09-06 DIAGNOSIS — I890 Lymphedema, not elsewhere classified: Secondary | ICD-10-CM | POA: Diagnosis not present

## 2019-09-06 DIAGNOSIS — R296 Repeated falls: Secondary | ICD-10-CM | POA: Diagnosis not present

## 2019-09-06 DIAGNOSIS — M6281 Muscle weakness (generalized): Secondary | ICD-10-CM | POA: Diagnosis not present

## 2019-09-08 DIAGNOSIS — I890 Lymphedema, not elsewhere classified: Secondary | ICD-10-CM | POA: Diagnosis not present

## 2019-09-08 DIAGNOSIS — M6281 Muscle weakness (generalized): Secondary | ICD-10-CM | POA: Diagnosis not present

## 2019-09-08 DIAGNOSIS — R296 Repeated falls: Secondary | ICD-10-CM | POA: Diagnosis not present

## 2019-09-09 DIAGNOSIS — M6281 Muscle weakness (generalized): Secondary | ICD-10-CM | POA: Diagnosis not present

## 2019-09-09 DIAGNOSIS — R296 Repeated falls: Secondary | ICD-10-CM | POA: Diagnosis not present

## 2019-09-09 DIAGNOSIS — R262 Difficulty in walking, not elsewhere classified: Secondary | ICD-10-CM | POA: Diagnosis not present

## 2019-09-12 DIAGNOSIS — R296 Repeated falls: Secondary | ICD-10-CM | POA: Diagnosis not present

## 2019-09-12 DIAGNOSIS — R262 Difficulty in walking, not elsewhere classified: Secondary | ICD-10-CM | POA: Diagnosis not present

## 2019-09-12 DIAGNOSIS — M6281 Muscle weakness (generalized): Secondary | ICD-10-CM | POA: Diagnosis not present

## 2019-09-13 DIAGNOSIS — I890 Lymphedema, not elsewhere classified: Secondary | ICD-10-CM | POA: Diagnosis not present

## 2019-09-13 DIAGNOSIS — R296 Repeated falls: Secondary | ICD-10-CM | POA: Diagnosis not present

## 2019-09-13 DIAGNOSIS — M6281 Muscle weakness (generalized): Secondary | ICD-10-CM | POA: Diagnosis not present

## 2019-09-13 DIAGNOSIS — R262 Difficulty in walking, not elsewhere classified: Secondary | ICD-10-CM | POA: Diagnosis not present

## 2019-09-14 DIAGNOSIS — M6281 Muscle weakness (generalized): Secondary | ICD-10-CM | POA: Diagnosis not present

## 2019-09-14 DIAGNOSIS — R262 Difficulty in walking, not elsewhere classified: Secondary | ICD-10-CM | POA: Diagnosis not present

## 2019-09-14 DIAGNOSIS — I890 Lymphedema, not elsewhere classified: Secondary | ICD-10-CM | POA: Diagnosis not present

## 2019-09-14 DIAGNOSIS — R296 Repeated falls: Secondary | ICD-10-CM | POA: Diagnosis not present

## 2019-09-16 DIAGNOSIS — I890 Lymphedema, not elsewhere classified: Secondary | ICD-10-CM | POA: Diagnosis not present

## 2019-09-16 DIAGNOSIS — M6281 Muscle weakness (generalized): Secondary | ICD-10-CM | POA: Diagnosis not present

## 2019-09-16 DIAGNOSIS — R296 Repeated falls: Secondary | ICD-10-CM | POA: Diagnosis not present

## 2019-09-17 DIAGNOSIS — R296 Repeated falls: Secondary | ICD-10-CM | POA: Diagnosis not present

## 2019-09-17 DIAGNOSIS — M6281 Muscle weakness (generalized): Secondary | ICD-10-CM | POA: Diagnosis not present

## 2019-09-17 DIAGNOSIS — I890 Lymphedema, not elsewhere classified: Secondary | ICD-10-CM | POA: Diagnosis not present

## 2019-09-20 DIAGNOSIS — I890 Lymphedema, not elsewhere classified: Secondary | ICD-10-CM | POA: Diagnosis not present

## 2019-09-20 DIAGNOSIS — R296 Repeated falls: Secondary | ICD-10-CM | POA: Diagnosis not present

## 2019-09-20 DIAGNOSIS — M6281 Muscle weakness (generalized): Secondary | ICD-10-CM | POA: Diagnosis not present

## 2019-09-20 DIAGNOSIS — R262 Difficulty in walking, not elsewhere classified: Secondary | ICD-10-CM | POA: Diagnosis not present

## 2019-09-22 DIAGNOSIS — M6281 Muscle weakness (generalized): Secondary | ICD-10-CM | POA: Diagnosis not present

## 2019-09-22 DIAGNOSIS — R262 Difficulty in walking, not elsewhere classified: Secondary | ICD-10-CM | POA: Diagnosis not present

## 2019-09-22 DIAGNOSIS — R296 Repeated falls: Secondary | ICD-10-CM | POA: Diagnosis not present

## 2019-09-22 DIAGNOSIS — I890 Lymphedema, not elsewhere classified: Secondary | ICD-10-CM | POA: Diagnosis not present

## 2019-09-23 DIAGNOSIS — R262 Difficulty in walking, not elsewhere classified: Secondary | ICD-10-CM | POA: Diagnosis not present

## 2019-09-23 DIAGNOSIS — R296 Repeated falls: Secondary | ICD-10-CM | POA: Diagnosis not present

## 2019-09-23 DIAGNOSIS — R399 Unspecified symptoms and signs involving the genitourinary system: Secondary | ICD-10-CM | POA: Diagnosis not present

## 2019-09-23 DIAGNOSIS — I10 Essential (primary) hypertension: Secondary | ICD-10-CM | POA: Diagnosis not present

## 2019-09-23 DIAGNOSIS — N390 Urinary tract infection, site not specified: Secondary | ICD-10-CM | POA: Diagnosis not present

## 2019-09-23 DIAGNOSIS — I890 Lymphedema, not elsewhere classified: Secondary | ICD-10-CM | POA: Diagnosis not present

## 2019-09-23 DIAGNOSIS — M6281 Muscle weakness (generalized): Secondary | ICD-10-CM | POA: Diagnosis not present

## 2019-09-23 DIAGNOSIS — N189 Chronic kidney disease, unspecified: Secondary | ICD-10-CM | POA: Diagnosis not present

## 2019-09-29 ENCOUNTER — Other Ambulatory Visit: Payer: Self-pay | Admitting: Internal Medicine

## 2019-09-29 DIAGNOSIS — I890 Lymphedema, not elsewhere classified: Secondary | ICD-10-CM | POA: Diagnosis not present

## 2019-09-29 DIAGNOSIS — R600 Localized edema: Secondary | ICD-10-CM

## 2019-09-29 DIAGNOSIS — R262 Difficulty in walking, not elsewhere classified: Secondary | ICD-10-CM | POA: Diagnosis not present

## 2019-09-29 DIAGNOSIS — I10 Essential (primary) hypertension: Secondary | ICD-10-CM

## 2019-09-29 DIAGNOSIS — M6281 Muscle weakness (generalized): Secondary | ICD-10-CM | POA: Diagnosis not present

## 2019-09-29 DIAGNOSIS — R296 Repeated falls: Secondary | ICD-10-CM | POA: Diagnosis not present

## 2019-09-30 DIAGNOSIS — N390 Urinary tract infection, site not specified: Secondary | ICD-10-CM | POA: Diagnosis not present

## 2019-09-30 DIAGNOSIS — B9620 Unspecified Escherichia coli [E. coli] as the cause of diseases classified elsewhere: Secondary | ICD-10-CM | POA: Diagnosis not present

## 2019-09-30 DIAGNOSIS — N189 Chronic kidney disease, unspecified: Secondary | ICD-10-CM | POA: Diagnosis not present

## 2019-09-30 DIAGNOSIS — I10 Essential (primary) hypertension: Secondary | ICD-10-CM | POA: Diagnosis not present

## 2019-10-01 ENCOUNTER — Other Ambulatory Visit: Payer: Self-pay

## 2019-10-01 ENCOUNTER — Encounter: Payer: Self-pay | Admitting: Family

## 2019-10-01 ENCOUNTER — Telehealth: Payer: Self-pay

## 2019-10-01 ENCOUNTER — Encounter: Payer: Medicare PPO | Admitting: Family

## 2019-10-01 DIAGNOSIS — M6281 Muscle weakness (generalized): Secondary | ICD-10-CM | POA: Diagnosis not present

## 2019-10-01 DIAGNOSIS — R296 Repeated falls: Secondary | ICD-10-CM | POA: Diagnosis not present

## 2019-10-01 DIAGNOSIS — I890 Lymphedema, not elsewhere classified: Secondary | ICD-10-CM | POA: Diagnosis not present

## 2019-10-01 DIAGNOSIS — R262 Difficulty in walking, not elsewhere classified: Secondary | ICD-10-CM | POA: Diagnosis not present

## 2019-10-01 NOTE — Progress Notes (Signed)
Subjective:   Mia [name] is a 86 y.o. female who presents for Medicare Annual (Subsequent) preventive examination.  Review of Systems:        Objective:     Vitals: There were no vitals taken for this visit.  There is no height or weight on file to calculate BMI.  Advanced Directives 10/01/2019 07/30/2019 01/05/2019 09/30/2018 09/11/2017 07/14/2017 05/15/2017  Does Patient Have a Medical Advance Directive? Yes Yes Yes Yes Yes Yes Yes  Type of Paramedic of [place];Living will;Out of facility DNR (pink MOST or yellow form) Out of facility DNR (pink MOST or yellow form) [place];Living will [place];Living will;Out of facility DNR (pink MOST or yellow form) [place];Out of facility DNR (pink MOST or yellow form);Living will [place];Living will;Out of facility DNR (pink MOST or yellow form) [place];Living will;Out of facility DNR (pink MOST or yellow form)  Does patient want to make changes to medical advance directive? No - Patient declined No - Patient declined No - Patient declined No - Patient declined No - Patient declined No - Patient declined No - Patient declined  Copy of [place] in Chart? Yes - validated most recent copy scanned in chart (See row information) - No - copy requested - Yes Yes Yes  Pre-existing out of facility DNR order (yellow form or pink MOST form) - - - - Yellow form placed in chart (order not valid for inpatient use) Yellow form placed in chart (order not valid for inpatient use) Yellow form placed in chart (order not valid for inpatient use)    Tobacco Social History   Tobacco Use  Smoking Status Former Smoker  . Packs/day: 0.25  . Years: 40.00  . Pack years: 10.00  . Types: Cigarettes  . Quit date: 07/30/2000  . Years since quitting: 19.1  Smokeless Tobacco Never Used     Counseling given: Not  Answered   Clinical Intake:                       Past Medical History:  Diagnosis Date  . Arthritis    oa  . Chronic kidney disease    chronic kidney diseaase follow by primary md  . Diabetes mellitus without complication ([place])    Diet controlled  . Former smoker   . H/O ventricular fibrillation 10 yrs ago dx  . Hypertension   . Prediabetes   . Seasonal allergies   . TIA (transient ischemic attack) none recent   Past Surgical History:  Procedure Laterality Date  . NM MYOCAR PERF WALL MOTION  01/2008   lexiscan myoview - normal pattern of perfusion in all regions, EF 92%, no significant ischemia demonstated  . TONSILLECTOMY  age 89   and adenoids  . TOTAL KNEE ARTHROPLASTY Right 10/06/2015   Procedure: RIGHT TOTAL KNEE ARTHROPLASTY;  Surgeon: [name], MD;  Location: [facility];  Service: Orthopedics;  Laterality: Right;  . TRANSTHORACIC ECHOCARDIOGRAM  12/2005   mild DUST, EF=>55%; mild mitral annular calcif; mild-mod aortic root calcif   Family History  Problem Relation Age of Onset  . Congestive Heart Failure Mother   . Alcohol abuse Father   . Heart disease Son   . Bipolar disorder Son   . Lung cancer Brother   . Post-traumatic stress disorder Daughter    Social History   Socioeconomic History  . Marital status: Widowed  Spouse name: Not on file  . Number of children: 4  . Years of education: PhD  . Highest education level: Not on file  Occupational History  . 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 Smoking status: Former Smoker    Packs/day: 0.25    Years: 40.00    Pack years: 10.00    Types: Cigarettes    Quit date: 07/30/2000    Years since quitting: 19.1  . Smokeless tobacco: Never Used  Substance and Sexual Activity  . Alcohol use: Yes    Alcohol/week: 7.0 standard drinks    Types: 7 Glasses of wine per week  . Drug use: No  . Sexual activity: Never  Other Topics Concern  . Not  on file  Social History Narrative  . Not on file   Social Determinants of Health   Financial Resource Strain:   . Difficulty of Paying Living Expenses:   Food Insecurity:   . Worried About Charity fundraiser in the Last Year:   . Arboriculturist in the Last Year:   Transportation Needs:   . Film/video editor (Medical):   Marland Kitchen Lack of Transportation (Non-Medical):   Physical Activity:   . Days of Exercise per Week:   . Minutes of Exercise per Session:   Stress:   . Feeling of Stress :   Social Connections:   . Frequency of Communication with Friends and Family:   . Frequency of Social Gatherings with Friends and Family:   . Attends Religious Services:   . Active Member of Clubs or Organizations:   . Attends Archivist Meetings:   Marland Kitchen Marital Status:     Outpatient Encounter Medications as of 10/01/2019  Medication Sig  . Ascorbic Acid (VITAMIN C) 1000 MG tablet Take 1,000 mg by mouth daily.  . cholecalciferol (VITAMIN D) 1000 UNITS tablet Take 1,000 Units by mouth daily. D3  . clopidogrel (PLAVIX) 75 MG tablet TAKE 1 TABLET(75 MG) BY MOUTH DAILY  . hydrochlorothiazide (HYDRODIURIL) 12.5 MG tablet TAKE 1 TABLET BY MOUTH EVERY OTHER DAY  . ketorolac (ACULAR) 0.5 % ophthalmic solution Place 1 drop into the right eye 2 (two) times daily.  . metoprolol succinate (TOPROL-XL) 25 MG 24 hr tablet Take one tablet by mouth once daily  . potassium chloride (KLOR-CON) 10 MEQ tablet TAKE 1 TABLET(10 MEQ) BY MOUTH DAILY   No facility-administered encounter medications on file as of 10/01/2019.    Activities of Daily Living No flowsheet data found.  Patient Care Team: Gayland Curry, DO as PCP - General (Geriatric Medicine) Monna Fam, MD as Consulting Physician (Ophthalmology) Debara Pickett Nadean Corwin, MD as Consulting Physician (Cardiology) [name], MD as Consulting Physician (Orthopedic Surgery)    Assessment:   This is a routine wellness examination for  [place].  Exercise Activities and Dietary recommendations    Goals    . Gain weight     Starting 07/11/16, I will attempt to increase my physical activity and I would like to gain some weight.        Fall Risk Fall Risk  10/01/2019 07/30/2019 04/12/2019 04/05/2019 11/30/2018  Falls in the past year? 0 0 0 1 0  Number falls in past yr: 0 0 0 0 0  Comment - - - - -  Injury with Fall? 0 0 0 1 0  Risk Factor Category  - - - - -  Risk for fall due to : - - - - -  Follow up - - - - -  Comment - - - - -   Is the patient's home free of loose throw rugs in walkways, pet beds, electrical cords, etc?         Grab bars in the bathroom?       Handrails on the stairs?         Adequate lighting?   no  Timed Get Up and Go performed:   Depression Screen PHQ 2/9 Scores 10/01/2019 04/12/2019 11/30/2018 09/30/2018  PHQ - 2 Score 0 0 0 0     Cognitive Function MMSE - Mini Mental State Exam 07/14/2017 07/11/2016 06/03/2016 07/15/2014  Not completed: - (No Data) - -  Orientation to time 5 - 5 4  Orientation to Place 5 - 5 5  Registration 3 - 3 3  Attention/ Calculation 4 - 3 5  Recall 1 - 1 1  Language- name 2 objects 2 - 2 2  Language- repeat 1 - 1 1  Language- follow 3 step command 3 - 3 2  Language- read & follow direction 1 - 1 1  Write a sentence 1 - 1 1  Copy design 1 - 1 1  Total score 27 - 26 26     6CIT Screen 10/01/2019 09/30/2018  What Year? 0 points 0 points  What month? 0 points 0 points  What time? 0 points 0 points  Count back from 20 0 points 0 points  Months in reverse 0 points 0 points  Repeat phrase 10 points 4 points  Total Score 10 4    Immunization History  Administered Date(s) Administered  . Fluad Quad(high Dose 65+) 02/11/2019  . Influenza, High Dose Seasonal PF 01/27/2016, 02/18/2018  . Influenza,inj,Quad PF,6+ Mos 02/10/2017  . Influenza-Unspecified 02/17/2013, 01/18/2014, 12/31/2014  . Pneumococcal Conjugate-13 07/15/2014  . Pneumococcal Polysaccharide-23  05/20/2010  . Tdap 07/09/2015  . Zoster 05/20/2010    Qualifies for Shingles Vaccine?  Screening Tests Health Maintenance  Topic Date Due  . URINE MICROALBUMIN  Never done  . COVID-19 Vaccine (1) Never done  . DEXA SCAN  Never done  . INFLUENZA VACCINE  12/19/2019  . TETANUS/TDAP  07/08/2025  . PNA vac Low Risk Adult  Completed    Cancer Screenings: Lung: Low Dose CT Chest recommended if Age 10-80 years, 30 pack-year currently smoking OR have quit w/in 15years. Patient . Breast:  Up to date on Mammogram?   Up to date of Bone Density/Dexa?  Colorectal:   Additional Screenings:: Hepatitis C Screening:      Plan:    I have personally reviewed and noted the following in the patient's chart:   . Medical and social history . Use of alcohol, tobacco or illicit drugs  . Current medications and supplements . Functional ability and status . Nutritional status . Physical activity . Advanced directives . List of other physicians . Hospitalizations, surgeries, and ER visits in previous 12 months . Vitals . Screenings to include cognitive, depression, and falls . Referrals and appointments  In addition, I have reviewed and discussed with patient certain preventive protocols, quality metrics, and best practice recommendations. A written personalized care plan for preventive services as well as general preventive health recommendations were provided to patient.     Nelda Bucks Evonte Prestage, NP  10/01/2019     This encounter was created in error - please disregard.Patient call three times by provider and CMA but didn't return calls.

## 2019-10-01 NOTE — Progress Notes (Signed)
    This service is provided via telemedicine  No vital signs collected/recorded due to the encounter was a telemedicine visit.   Location of patient (ex: home, work): Home.  Patient consents to a telephone visit: Yes.  Location of the provider (ex: office, home):  [facility].  Name of any referring provider: N/A  Names of all persons participating in the telemedicine service and their role in the encounter:  Patient, Janyiah Silveri, RMA, Ngetich, Dinah, NP.    Time spent on call: 8 minutes spent on the phone with Medical Assistant.   

## 2019-10-01 NOTE — Telephone Encounter (Signed)
Patient was called 3 times by provider Ngetich, Dinah, NP and no answer. Called patient again and no answer. Voicemail was left with office call back number for patient to reschedule appointment.

## 2019-10-01 NOTE — Telephone Encounter (Signed)
1st attempt to call patient and no answer. Voicemail was left with office call back number. Patient made aware that there will be 2 more attempts to reach out. Patient also made aware in voicemail that 3rd attempt will lead to rescheduling appointment.

## 2019-10-04 ENCOUNTER — Encounter: Payer: Self-pay | Admitting: Family

## 2019-10-04 ENCOUNTER — Other Ambulatory Visit: Payer: Self-pay

## 2019-10-04 ENCOUNTER — Ambulatory Visit (INDEPENDENT_AMBULATORY_CARE_PROVIDER_SITE_OTHER): Payer: Medicare PPO | Admitting: Family

## 2019-10-04 DIAGNOSIS — M6281 Muscle weakness (generalized): Secondary | ICD-10-CM | POA: Diagnosis not present

## 2019-10-04 DIAGNOSIS — R296 Repeated falls: Secondary | ICD-10-CM | POA: Diagnosis not present

## 2019-10-04 DIAGNOSIS — I890 Lymphedema, not elsewhere classified: Secondary | ICD-10-CM | POA: Diagnosis not present

## 2019-10-04 DIAGNOSIS — Z0000 Encounter for general adult medical examination without abnormal findings: Secondary | ICD-10-CM

## 2019-10-04 DIAGNOSIS — R262 Difficulty in walking, not elsewhere classified: Secondary | ICD-10-CM | POA: Diagnosis not present

## 2019-10-04 NOTE — Progress Notes (Signed)
Subjective:   Mia [name] is a 86 y.o. female who presents for Medicare Annual (Subsequent) preventive examination.  Review of Systems:  Cardiac Risk Factors include: advanced age (>9men, >47 women);hypertension;smoking/ tobacco exposure     Objective:     Vitals: There were no vitals taken for this visit.  There is no height or weight on file to calculate BMI.  Advanced Directives 10/04/2019 10/01/2019 07/30/2019 01/05/2019 09/30/2018 09/11/2017 07/14/2017  Does Patient Have a Medical Advance Directive? Yes Yes Yes Yes Yes Yes Yes  Type of Paramedic of [place];Living will;Out of facility DNR (pink MOST or yellow form) [place];Living will;Out of facility DNR (pink MOST or yellow form) Out of facility DNR (pink MOST or yellow form) [place];Living will [place];Living will;Out of facility DNR (pink MOST or yellow form) [place];Out of facility DNR (pink MOST or yellow form);Living will [place];Living will;Out of facility DNR (pink MOST or yellow form)  Does patient want to make changes to medical advance directive? No - Patient declined No - Patient declined No - Patient declined No - Patient declined No - Patient declined No - Patient declined No - Patient declined  Copy of [place] in Chart? Yes - validated most recent copy scanned in chart (See row information) Yes - validated most recent copy scanned in chart (See row information) - No - copy requested - Yes Yes  Pre-existing out of facility DNR order (yellow form or pink MOST form) - - - - - Yellow form placed in chart (order not valid for inpatient use) Yellow form placed in chart (order not valid for inpatient use)    Tobacco Social History   Tobacco Use  Smoking Status Former Smoker  . Packs/day: 0.25  . Years: 40.00  . Pack years: 10.00  . Types: Cigarettes  . Quit date: 07/30/2000  .  Years since quitting: 19.1  Smokeless Tobacco Never Used     Counseling given: Not Answered   Clinical Intake:  Pre-visit preparation completed: No  Pain : No/denies pain     BMI - recorded: 17.85 Nutritional Status: BMI <19  Underweight Nutritional Risks: None Diabetes: No  How often do you need to have someone help you when you read instructions, pamphlets, or other written materials from your doctor or pharmacy?: 1 - Never What is the last grade level you completed in school?: PHD in Child development oand family relation  Interpreter Needed?: No  Information entered by :: Fritzie Prioleau FNP-C  Past Medical History:  Diagnosis Date  . Arthritis    oa  . Chronic kidney disease    chronic kidney diseaase follow by primary md  . Diabetes mellitus without complication ([place])    Diet controlled  . Former smoker   . H/O ventricular fibrillation 10 yrs ago dx  . Hypertension   . Prediabetes   . Seasonal allergies   . TIA (transient ischemic attack) none recent   Past Surgical History:  Procedure Laterality Date  . NM MYOCAR PERF WALL MOTION  01/2008   lexiscan myoview - normal pattern of perfusion in all regions, EF 92%, no significant ischemia demonstated  . TONSILLECTOMY  age 52   and adenoids  . TOTAL KNEE ARTHROPLASTY Right 10/06/2015   Procedure: RIGHT TOTAL KNEE ARTHROPLASTY;  Surgeon: [name], MD;  Location: [facility];  Service: Orthopedics;  Laterality: Right;  . TRANSTHORACIC ECHOCARDIOGRAM  12/2005  mild DUST, EF=>55%; mild mitral annular calcif; mild-mod aortic root calcif   Family History  Problem Relation Age of Onset  . Congestive Heart Failure Mother   . Alcohol abuse Father   . Heart disease Son   . Bipolar disorder Son   . Lung cancer Brother   . Post-traumatic stress disorder Daughter    Social History   Socioeconomic History  . Marital status: Widowed    Spouse name: Not on file  . Number of children: 4  . Years of education:  PhD  . Highest education level: Not on file  Occupational History  . 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 Smoking status: Former Smoker    Packs/day: 0.25    Years: 40.00    Pack years: 10.00    Types: Cigarettes    Quit date: 07/30/2000    Years since quitting: 19.1  . Smokeless tobacco: Never Used  Substance and Sexual Activity  . Alcohol use: Yes    Alcohol/week: 7.0 standard drinks    Types: 7 Glasses of wine per week  . Drug use: No  . Sexual activity: Never  Other Topics Concern  . Not on file  Social History Narrative  . Not on file   Social Determinants of Health   Financial Resource Strain:   . Difficulty of Paying Living Expenses:   Food Insecurity:   . Worried About Charity fundraiser in the Last Year:   . Arboriculturist in the Last Year:   Transportation Needs:   . Film/video editor (Medical):   Marland Kitchen Lack of Transportation (Non-Medical):   Physical Activity:   . Days of Exercise per Week:   . Minutes of Exercise per Session:   Stress:   . Feeling of Stress :   Social Connections:   . Frequency of Communication with Friends and Family:   . Frequency of Social Gatherings with Friends and Family:   . Attends Religious Services:   . Active Member of Clubs or Organizations:   . Attends Archivist Meetings:   Marland Kitchen Marital Status:     Outpatient Encounter Medications as of 10/04/2019  Medication Sig  . Ascorbic Acid (VITAMIN C) 1000 MG tablet Take 1,000 mg by mouth daily.  . cholecalciferol (VITAMIN D) 1000 UNITS tablet Take 1,000 Units by mouth daily. D3  . clopidogrel (PLAVIX) 75 MG tablet TAKE 1 TABLET(75 MG) BY MOUTH DAILY  . hydrochlorothiazide (HYDRODIURIL) 12.5 MG tablet TAKE 1 TABLET BY MOUTH EVERY OTHER DAY  . ketorolac (ACULAR) 0.5 % ophthalmic solution Place 1 drop into the right eye 2 (two) times daily.  . metoprolol succinate (TOPROL-XL) 25 MG 24 hr tablet Take one tablet by mouth once  daily  . potassium chloride (KLOR-CON) 10 MEQ tablet TAKE 1 TABLET(10 MEQ) BY MOUTH DAILY   No facility-administered encounter medications on file as of 10/04/2019.    Activities of Daily Living In your present state of health, do you have any difficulty performing the following activities: 10/04/2019  Hearing? N  Vision? N  Difficulty concentrating or making decisions? Y  Comment memory gets fuzzy  Walking or climbing stairs? N  Comment hx fall  Dressing or bathing? N  Doing errands, shopping? Y  Comment [place] and eating ? Y  Comment lives in [place] and eats at the facility  Using the Toilet? N  In the past six months, have you accidently leaked  urine? N  Do you have problems with loss of bowel control? N  Managing your Medications? N  Managing your Finances? N  Housekeeping or managing your Housekeeping? [place] assist once a week  Some recent data might be hidden    Patient Care Team: Gayland Curry, DO as PCP - General (Geriatric Medicine) Monna Fam, MD as Consulting Physician (Ophthalmology) Debara Pickett Nadean Corwin, MD as Consulting Physician (Cardiology) [name], MD as Consulting Physician (Orthopedic Surgery)    Assessment:   This is a routine wellness examination for [place].  Exercise Activities and Dietary recommendations Current Exercise Habits: Home exercise routine, Type of exercise: stretching;strength training/weights, Time (Minutes): 30, Frequency (Times/Week): 3, Weekly Exercise (Minutes/Week): 90, Intensity: Moderate, Exercise limited by: None identified  Goals    . Gain weight     Starting 07/11/16, I will attempt to increase my physical activity and I would like to gain some weight.     . pat     I would like to improve my walk and balance        Fall Risk Fall Risk  10/04/2019 10/01/2019 07/30/2019 04/12/2019 04/05/2019  Falls in the past year? 0 0 0 0 1  Number falls in past yr: 0 0 0 0 0  Comment - - -  - -  Injury with Fall? 0 0 0 0 1  Risk Factor Category  - - - - -  Risk for fall due to : - - - - -  Follow up - - - - -  Comment - - - - -   Is the patient's home free of loose throw rugs in walkways, pet beds, electrical cords, etc?   no      Grab bars in the bathroom? yes      Handrails on the stairs?   yes      Adequate lighting?   yes  Depression Screen PHQ 2/9 Scores 10/04/2019 10/01/2019 04/12/2019 11/30/2018  PHQ - 2 Score 0 0 0 0     Cognitive Function MMSE - Mini Mental State Exam 07/14/2017 07/11/2016 06/03/2016 07/15/2014  Not completed: - (No Data) - -  Orientation to time 5 - 5 4  Orientation to Place 5 - 5 5  Registration 3 - 3 3  Attention/ Calculation 4 - 3 5  Recall 1 - 1 1  Language- name 2 objects 2 - 2 2  Language- repeat 1 - 1 1  Language- follow 3 step command 3 - 3 2  Language- read & follow direction 1 - 1 1  Write a sentence 1 - 1 1  Copy design 1 - 1 1  Total score 27 - 26 26     6CIT Screen 10/04/2019 10/01/2019 09/30/2018  What Year? 0 points 0 points 0 points  What month? 0 points 0 points 0 points  What time? 0 points 0 points 0 points  Count back from 20 0 points 0 points 0 points  Months in reverse 0 points 0 points 0 points  Repeat phrase 10 points 10 points 4 points  Total Score 10 10 4     Immunization History  Administered Date(s) Administered  . Fluad Quad(high Dose 65+) 02/11/2019  . Influenza, High Dose Seasonal PF 01/27/2016, 02/18/2018  . Influenza,inj,Quad PF,6+ Mos 02/10/2017  . Influenza-Unspecified 02/17/2013, 01/18/2014, 12/31/2014  . Pneumococcal Conjugate-13 07/15/2014  . Pneumococcal Polysaccharide-23 05/20/2010  . Tdap 07/09/2015  . Zoster 05/20/2010    Qualifies for Shingles Vaccine?  Screening Tests Health Maintenance  Topic Date Due  . URINE MICROALBUMIN  Never done  . COVID-19 Vaccine (1) Never done  . DEXA SCAN  Never done  . INFLUENZA VACCINE  12/19/2019  . TETANUS/TDAP  07/08/2025  . PNA vac Low Risk  Adult  Completed    Cancer Screenings: Lung: Low Dose CT Chest recommended if Age 36-80 years, 30 pack-year currently smoking OR have quit w/in 15years. Patient does not qualify. Breast:  Up to date on Mammogram? Yes  No longer desired  Up to date of Bone Density/Dexa? No Colorectal: Age Out   Additional Screenings: Hepatitis C Screening: Low Risk    Plan:   - Decline mammogram and Dexa scan no longer desired.  - Will bring COVID-19 vaccine card to provider's office when she finds it. I have personally reviewed and noted the following in the patient's chart:   . Medical and social history . Use of alcohol, tobacco or illicit drugs  . Current medications and supplements . Functional ability and status . Nutritional status . Physical activity . Advanced directives . List of other physicians . Hospitalizations, surgeries, and ER visits in previous 12 months . Vitals . Screenings to include cognitive, depression, and falls . Referrals and appointments  In addition, I have reviewed and discussed with patient certain preventive protocols, quality metrics, and best practice recommendations. A written personalized care plan for preventive services as well as general preventive health recommendations were provided to patient.  Sandrea Hughs, NP  10/04/2019

## 2019-10-04 NOTE — Patient Instructions (Signed)
Mia [name] , Thank you for taking time to come for your Medicare Wellness Visit. I appreciate your ongoing commitment to your health goals. Please review the following plan we discussed and let me know if I can assist you in the future.   Screening recommendations/referrals: Colonoscopy: Aged out  Mammogram: Declined  Bone Density: Declined  Recommended yearly ophthalmology/optometry visit for glaucoma screening and checkup Recommended yearly dental visit for hygiene and checkup  Vaccinations: Influenza vaccine: Up to date  Pneumococcal vaccine : Up to date  Tdap vaccine: Up to date due 07/08/2025  Shingles vaccine: Please get Shingrix vaccine at your pharmacy  COVID-19 Vaccine : Please bring your COVID-19 vaccine card to Dr.Reed's office if you have completed your COVID-19 vaccines.   Advanced directives: Yes   Conditions/risks identified:Advance age female > 80 yrs,Hypertension and Hx of smoking   Next appointment: 1 Year   Preventive Care 50 Years and Older, Female Preventive care refers to lifestyle choices and visits with your health care provider that can promote health and wellness. What does preventive care include?  A yearly physical exam. This is also called an annual well check.  Dental exams once or twice a year.  Routine eye exams. Ask your health care provider how often you should have your eyes checked.  Personal lifestyle choices, including:  Daily care of your teeth and gums.  Regular physical activity.  Eating a healthy diet.  Avoiding tobacco and drug use.  Limiting alcohol use.  Practicing safe sex.  Taking low-dose aspirin every day.  Taking vitamin and mineral supplements as recommended by your health care provider. What happens during an annual well check? The services and screenings done by your health care provider during your annual well check will depend on your age, overall health, lifestyle risk factors, and family history of disease.  Counseling  Your health care provider may ask you questions about your:  Alcohol use.  Tobacco use.  Drug use.  Emotional well-being.  Home and relationship well-being.  Sexual activity.  Eating habits.  History of falls.  Memory and ability to understand (cognition).  Work and work Statistician.  Reproductive health. Screening  You may have the following tests or measurements:  Height, weight, and BMI.  Blood pressure.  Lipid and cholesterol levels. These may be checked every 5 years, or more frequently if you are over 65 years old.  Skin check.  Lung cancer screening. You may have this screening every year starting at age 22 if you have a 30-pack-year history of smoking and currently smoke or have quit within the past 15 years.  Fecal occult blood test (FOBT) of the stool. You may have this test every year starting at age 71.  Flexible sigmoidoscopy or colonoscopy. You may have a sigmoidoscopy every 5 years or a colonoscopy every 10 years starting at age 55.  Hepatitis C blood test.  Hepatitis B blood test.  Sexually transmitted disease (STD) testing.  Diabetes screening. This is done by checking your blood sugar (glucose) after you have not eaten for a while (fasting). You may have this done every 1-3 years.  Bone density scan. This is done to screen for osteoporosis. You may have this done starting at age 49.  Mammogram. This may be done every 1-2 years. Talk to your health care provider about how often you should have regular mammograms. Talk with your health care provider about your test results, treatment options, and if necessary, the need for more tests. Vaccines  Your  health care provider may recommend certain vaccines, such as:  Influenza vaccine. This is recommended every year.  Tetanus, diphtheria, and acellular pertussis (Tdap, Td) vaccine. You may need a Td booster every 10 years.  Zoster vaccine. You may need this after age 45.   Pneumococcal 13-valent conjugate (PCV13) vaccine. One dose is recommended after age 42.  Pneumococcal polysaccharide (PPSV23) vaccine. One dose is recommended after age 89. Talk to your health care provider about which screenings and vaccines you need and how often you need them. This information is not intended to replace advice given to you by your health care provider. Make sure you discuss any questions you have with your health care provider. Document Released: 06/02/2015 Document Revised: 01/24/2016 Document Reviewed: 03/07/2015 Elsevier Interactive Patient Education  2017 [place] Prevention in the Home Falls can cause injuries. They can happen to people of all ages. There are many things you can do to make your home safe and to help prevent falls. What can I do on the outside of my home?  Regularly fix the edges of walkways and driveways and fix any cracks.  Remove anything that might make you trip as you walk through a door, such as a raised step or threshold.  Trim any bushes or trees on the path to your home.  Use bright outdoor lighting.  Clear any walking paths of anything that might make someone trip, such as rocks or tools.  Regularly check to see if handrails are loose or broken. Make sure that both sides of any steps have handrails.  Any raised decks and porches should have guardrails on the edges.  Have any leaves, snow, or ice cleared regularly.  Use sand or salt on walking paths during winter.  Clean up any spills in your garage right away. This includes oil or grease spills. What can I do in the bathroom?  Use night lights.  Install grab bars by the toilet and in the tub and shower. Do not use towel bars as grab bars.  Use non-skid mats or decals in the tub or shower.  If you need to sit down in the shower, use a plastic, non-slip stool.  Keep the floor dry. Clean up any water that spills on the floor as soon as it happens.  Remove soap  buildup in the tub or shower regularly.  Attach bath mats securely with double-sided non-slip rug tape.  Do not have throw rugs and other things on the floor that can make you trip. What can I do in the bedroom?  Use night lights.  Make sure that you have a light by your bed that is easy to reach.  Do not use any sheets or blankets that are too big for your bed. They should not hang down onto the floor.  Have a firm chair that has side arms. You can use this for support while you get dressed.  Do not have throw rugs and other things on the floor that can make you trip. What can I do in the kitchen?  Clean up any spills right away.  Avoid walking on wet floors.  Keep items that you use a lot in easy-to-reach places.  If you need to reach something above you, use a strong step stool that has a grab bar.  Keep electrical cords out of the way.  Do not use floor polish or wax that makes floors slippery. If you must use wax, use non-skid floor wax.  Do not  have throw rugs and other things on the floor that can make you trip. What can I do with my stairs?  Do not leave any items on the stairs.  Make sure that there are handrails on both sides of the stairs and use them. Fix handrails that are broken or loose. Make sure that handrails are as long as the stairways.  Check any carpeting to make sure that it is firmly attached to the stairs. Fix any carpet that is loose or worn.  Avoid having throw rugs at the top or bottom of the stairs. If you do have throw rugs, attach them to the floor with carpet tape.  Make sure that you have a light switch at the top of the stairs and the bottom of the stairs. If you do not have them, ask someone to add them for you. What else can I do to help prevent falls?  Wear shoes that:  Do not have high heels.  Have rubber bottoms.  Are comfortable and fit you well.  Are closed at the toe. Do not wear sandals.  If you use a stepladder:  Make  sure that it is fully opened. Do not climb a closed stepladder.  Make sure that both sides of the stepladder are locked into place.  Ask someone to hold it for you, if possible.  Clearly mark and make sure that you can see:  Any grab bars or handrails.  First and last steps.  Where the edge of each step is.  Use tools that help you move around (mobility aids) if they are needed. These include:  Canes.  Walkers.  Scooters.  Crutches.  Turn on the lights when you go into a dark area. Replace any light bulbs as soon as they burn out.  Set up your furniture so you have a clear path. Avoid moving your furniture around.  If any of your floors are uneven, fix them.  If there are any pets around you, be aware of where they are.  Review your medicines with your doctor. Some medicines can make you feel dizzy. This can increase your chance of falling. Ask your doctor what other things that you can do to help prevent falls. This information is not intended to replace advice given to you by your health care provider. Make sure you discuss any questions you have with your health care provider. Document Released: 03/02/2009 Document Revised: 10/12/2015 Document Reviewed: 06/10/2014 Elsevier Interactive Patient Education  2017 [employer].

## 2019-10-04 NOTE — Progress Notes (Signed)
    This service is provided via telemedicine  No vital signs collected/recorded due to the encounter was a telemedicine visit.   Location of patient (ex: home, work): Home.  Patient consents to a telephone visit: Yes.  Location of the provider (ex: office, home):  [facility].  Name of any referring provider: N/A  Names of all persons participating in the telemedicine service and their role in the encounter:  Patient, Mia [name], RMA, Ngetich, Dinah, NP.    Time spent on call: 8 minutes spent on the phone with Medical Assistant.   

## 2019-10-06 DIAGNOSIS — I890 Lymphedema, not elsewhere classified: Secondary | ICD-10-CM | POA: Diagnosis not present

## 2019-10-06 DIAGNOSIS — R262 Difficulty in walking, not elsewhere classified: Secondary | ICD-10-CM | POA: Diagnosis not present

## 2019-10-06 DIAGNOSIS — R296 Repeated falls: Secondary | ICD-10-CM | POA: Diagnosis not present

## 2019-10-06 DIAGNOSIS — M6281 Muscle weakness (generalized): Secondary | ICD-10-CM | POA: Diagnosis not present

## 2019-10-07 ENCOUNTER — Telehealth: Payer: Self-pay | Admitting: *Deleted

## 2019-10-07 NOTE — Telephone Encounter (Signed)
Patient called and stated that she is at her pharmacy and her medication was not called in. Stated that she saw a NP at an Urgent Care in [facility] today for her Cystitis and the NP was calling in Keflex for her and the pharmacy does not have the Rx and she is wanting our office to call it in for her.  Does not want an appointment, stated that she has already been seen for this and just wants the medication sent in.  I instructed the patient that she would need to call the Urgent Care in [facility] to get that medication called in. She stated that she would call them.

## 2019-10-11 ENCOUNTER — Ambulatory Visit (INDEPENDENT_AMBULATORY_CARE_PROVIDER_SITE_OTHER): Payer: Medicare PPO | Admitting: Family

## 2019-10-11 ENCOUNTER — Encounter: Payer: Self-pay | Admitting: Family

## 2019-10-11 ENCOUNTER — Other Ambulatory Visit: Payer: Self-pay

## 2019-10-11 VITALS — BP 120/84 | HR 82 | Temp 98.6°F | Resp 16 | Ht 67.0 in | Wt 125.0 lb

## 2019-10-11 DIAGNOSIS — R399 Unspecified symptoms and signs involving the genitourinary system: Secondary | ICD-10-CM

## 2019-10-11 LAB — POCT URINALYSIS DIPSTICK
Bilirubin, UA: NEGATIVE
Glucose, UA: NEGATIVE
Ketones, UA: NEGATIVE
Nitrite, UA: NEGATIVE
Protein, UA: POSITIVE — AB
Spec Grav, UA: 1.025 (ref 1.010–1.025)
Urobilinogen, UA: NEGATIVE E.U./dL — AB
pH, UA: 6 (ref 5.0–8.0)

## 2019-10-11 NOTE — Progress Notes (Signed)
Provider: Neymar Dowe FNP-C  Gayland Curry, DO  Patient Care Team: Gayland Curry, DO as PCP - General (Geriatric Medicine) Monna Fam, MD as Consulting Physician (Ophthalmology) Debara Pickett Nadean Corwin, MD as Consulting Physician (Cardiology) [name], MD as Consulting Physician (Orthopedic Surgery)  Extended Emergency Contact Information Primary Emergency Contact: Alguire,Steve Address: [address],  [place] [address] [place] of [place] Phone: [phone] Relation: Son Secondary Emergency Contact: Davis,Chandra Address: [address]          [place], [place] [address] [place] of [place] Mobile Phone: [phone] Relation: Daughter  Code Status:  DNR Goals of care: Advanced Directive information Advanced Directives 10/11/2019  Does Patient Have a Medical Advance Directive? Yes  Type of Advance Directive Living will;Healthcare Power of [place];Out of facility DNR (pink MOST or yellow form)  Does patient want to make changes to medical advance directive? No - Patient declined  Copy of [place] in Chart? Yes - validated most recent copy scanned in chart (See row information)  Pre-existing out of facility DNR order (yellow form or pink MOST form) -     Chief Complaint  Patient presents with  . Acute Visit    Bladder Problems.     HPI:  Pt is a 86 y.o. female seen today for an acute visit for evaluation of bladder problems.she states went to urgent care clinic at [place] [facility] urine specimen was collected was treated.she went back after one week urine showed bacteria not cleared up.she was prescribed Keflex but states medication was not send to her pharmacy.Also state ultrasound sound was ordered for 10/28/2019 due to right flank pain.she continues to have increased urine frequency,urgency and dysuria.she denies nay decreased urine amount.  Addendum:  Patient called her pharmacy during visit was told  Keflex prescription from [facility] was available for pickup   Past Medical History:  Diagnosis Date  . Arthritis    oa  . Chronic kidney disease    chronic kidney diseaase follow by primary md  . Diabetes mellitus without complication ([place])    Diet controlled  . Former smoker   . H/O ventricular fibrillation 10 yrs ago dx  . Hypertension   . Prediabetes   . Seasonal allergies   . TIA (transient ischemic attack) none recent   Past Surgical History:  Procedure Laterality Date  . NM MYOCAR PERF WALL MOTION  01/2008   lexiscan myoview - normal pattern of perfusion in all regions, EF 92%, no significant ischemia demonstated  . TONSILLECTOMY  age 70   and adenoids  . TOTAL KNEE ARTHROPLASTY Right 10/06/2015   Procedure: RIGHT TOTAL KNEE ARTHROPLASTY;  Surgeon: [name], MD;  Location: [facility];  Service: Orthopedics;  Laterality: Right;  . TRANSTHORACIC ECHOCARDIOGRAM  12/2005   mild DUST, EF=>55%; mild mitral annular calcif; mild-mod aortic root calcif    No Known Allergies  Outpatient Encounter Medications as of 10/11/2019  Medication Sig  . cholecalciferol (VITAMIN D) 1000 UNITS tablet Take 1,000 Units by mouth daily. D3  . clopidogrel (PLAVIX) 75 MG tablet TAKE 1 TABLET(75 MG) BY MOUTH DAILY  . hydrochlorothiazide (HYDRODIURIL) 12.5 MG tablet TAKE 1 TABLET BY MOUTH EVERY OTHER DAY  . ketorolac (ACULAR) 0.5 % ophthalmic solution Place 1 drop into the right eye 2 (two) times daily.  . metoprolol succinate (TOPROL-XL) 25 MG 24 hr tablet Take one tablet by mouth once daily  .  potassium chloride (KLOR-CON) 10 MEQ tablet TAKE 1 TABLET(10 MEQ) BY MOUTH DAILY  . [DISCONTINUED] Ascorbic Acid (VITAMIN C) 1000 MG tablet Take 1,000 mg by mouth daily.   No facility-administered encounter medications on file as of 10/11/2019.    Review of Systems  Constitutional: Negative for appetite change, chills, fatigue and fever.  Respiratory: Negative for cough, chest  tightness and shortness of breath.   Cardiovascular: Negative for chest pain, palpitations and leg swelling.  Gastrointestinal: Negative for abdominal distention, abdominal pain, constipation, diarrhea, nausea and vomiting.  Genitourinary: Positive for dysuria, flank pain, frequency and urgency. Negative for decreased urine volume, difficulty urinating and hematuria.  Musculoskeletal: Positive for gait problem.  Neurological: Negative for dizziness, speech difficulty, light-headedness and headaches.  Psychiatric/Behavioral: Negative for agitation, confusion and sleep disturbance. The patient is not nervous/anxious.     Immunization History  Administered Date(s) Administered  . Fluad Quad(high Dose 65+) 02/11/2019  . Influenza, High Dose Seasonal PF 01/27/2016, 02/18/2018  . Influenza,inj,Quad PF,6+ Mos 02/10/2017  . Influenza-Unspecified 02/17/2013, 01/18/2014, 12/31/2014  . Moderna SARS-COVID-2 Vaccination 06/07/2019, 07/05/2019  . Pneumococcal Conjugate-13 07/15/2014  . Pneumococcal Polysaccharide-23 05/20/2010  . Tdap 07/09/2015  . Zoster 05/20/2010   Pertinent  Health Maintenance Due  Topic Date Due  . URINE MICROALBUMIN  Never done  . DEXA SCAN  10/03/2020 (Originally 09/25/1992)  . INFLUENZA VACCINE  12/19/2019  . PNA vac Low Risk Adult  Completed   Fall Risk  10/11/2019 10/04/2019 10/01/2019 07/30/2019 04/12/2019  Falls in the past year? 0 0 0 0 0  Number falls in past yr: 0 0 0 0 0  Comment - - - - -  Injury with Fall? 0 0 0 0 0  Risk Factor Category  - - - - -  Risk for fall due to : - - - - -  Follow up - - - - -  Comment - - - - -    Vitals:   10/11/19 1425  BP: 120/84  Pulse: 82  Resp: 16  Temp: 98.6 F (37 C)  SpO2: 91%  Weight: 125 lb (56.7 kg)  Height: 5\' 7"  (1.702 m)   Body mass index is 19.58 kg/m. Physical Exam Vitals reviewed.  Constitutional:      General: She is not in acute distress.    Appearance: She is normal weight. She is not ill-appearing.   HENT:     Head: Normocephalic.  Eyes:     General: No scleral icterus.       Right eye: No discharge.        Left eye: No discharge.     Extraocular Movements: Extraocular movements intact.     Conjunctiva/sclera: Conjunctivae normal.     Pupils: Pupils are equal, round, and reactive to light.  Cardiovascular:     Rate and Rhythm: Normal rate. Rhythm irregular.     Pulses: Normal pulses.     Heart sounds: Normal heart sounds. No murmur. No friction rub. No gallop.   Pulmonary:     Effort: Pulmonary effort is normal. No respiratory distress.     Breath sounds: Normal breath sounds. No wheezing, rhonchi or rales.  Chest:     Chest wall: No tenderness.  Abdominal:     General: Bowel sounds are normal. There is no distension.     Palpations: Abdomen is soft. There is no mass.     Tenderness: There is abdominal tenderness in the suprapubic area. There is no right CVA tenderness, left CVA tenderness, guarding  or rebound.  Neurological:     Mental Status: She is alert.    Labs reviewed: Recent Labs    12/04/18 1022  NA 143  K 3.9  CL 104  CO2 29  GLUCOSE 78  BUN 27*  CREATININE 1.71*  CALCIUM 9.8   Recent Labs    12/04/18 1022  AST 21  ALT 12  BILITOT 0.6  PROT 6.6   Recent Labs    12/04/18 1022 04/05/19 1352  WBC 6.2 5.3  NEUTROABS 4,706 4,033  HGB 15.1 13.5  HCT 45.8* 40.6  MCV 92.7 92.5  PLT 194 211   Lab Results  Component Value Date   TSH 2.770 09/04/2015   Lab Results  Component Value Date   HGBA1C 6.1 (H) 12/04/2018   Lab Results  Component Value Date   CHOL 151 09/04/2015   HDL 85 09/04/2015   LDLCALC 53 09/04/2015   TRIG 66 09/04/2015   CHOLHDL 1.8 09/04/2015    Significant Diagnostic Results in last 30 days:  No results found.  Assessment/Plan  Symptoms of urinary tract infection Afebrile. - POC Urinalysis Dipstick shows yellow clear urine positive for blood and white blood cells but negative for nitrites. - Increase fluid intake  to 6-8 glasses of water  - received phone call from pharmacy that Keflex prescription ordered at [facility] by another provider was available for pickup.will send  urine for culture to ensure that it's sensitivity to Keflex.  - Urine Culture - Has U/s ordered at [facility] due 10/28/2019   Family/ staff Communication: Reviewed plan of care with patient verbalized understanding.  Labs/tests ordered: - Urine Culture  Next Appointment: as needed if symptoms worsen or fail to improve   Sandrea Hughs, NP

## 2019-10-11 NOTE — Patient Instructions (Signed)
-   Increase water intake 6-8 glasses of water daily  - Notify provider if symptoms worsen or fail to improve

## 2019-10-12 LAB — URINE CULTURE
MICRO NUMBER:: 10513512
Result:: NO GROWTH
SPECIMEN QUALITY:: ADEQUATE

## 2019-10-13 DIAGNOSIS — R296 Repeated falls: Secondary | ICD-10-CM | POA: Diagnosis not present

## 2019-10-13 DIAGNOSIS — R262 Difficulty in walking, not elsewhere classified: Secondary | ICD-10-CM | POA: Diagnosis not present

## 2019-10-13 DIAGNOSIS — I890 Lymphedema, not elsewhere classified: Secondary | ICD-10-CM | POA: Diagnosis not present

## 2019-10-13 DIAGNOSIS — M6281 Muscle weakness (generalized): Secondary | ICD-10-CM | POA: Diagnosis not present

## 2019-10-14 DIAGNOSIS — I890 Lymphedema, not elsewhere classified: Secondary | ICD-10-CM | POA: Diagnosis not present

## 2019-10-14 DIAGNOSIS — R296 Repeated falls: Secondary | ICD-10-CM | POA: Diagnosis not present

## 2019-10-14 DIAGNOSIS — R262 Difficulty in walking, not elsewhere classified: Secondary | ICD-10-CM | POA: Diagnosis not present

## 2019-10-14 DIAGNOSIS — M6281 Muscle weakness (generalized): Secondary | ICD-10-CM | POA: Diagnosis not present

## 2019-10-19 DIAGNOSIS — R296 Repeated falls: Secondary | ICD-10-CM | POA: Diagnosis not present

## 2019-10-19 DIAGNOSIS — R262 Difficulty in walking, not elsewhere classified: Secondary | ICD-10-CM | POA: Diagnosis not present

## 2019-10-19 DIAGNOSIS — I890 Lymphedema, not elsewhere classified: Secondary | ICD-10-CM | POA: Diagnosis not present

## 2019-10-19 DIAGNOSIS — M6281 Muscle weakness (generalized): Secondary | ICD-10-CM | POA: Diagnosis not present

## 2019-10-20 DIAGNOSIS — R296 Repeated falls: Secondary | ICD-10-CM | POA: Diagnosis not present

## 2019-10-20 DIAGNOSIS — M6281 Muscle weakness (generalized): Secondary | ICD-10-CM | POA: Diagnosis not present

## 2019-10-20 DIAGNOSIS — R262 Difficulty in walking, not elsewhere classified: Secondary | ICD-10-CM | POA: Diagnosis not present

## 2019-10-20 DIAGNOSIS — I890 Lymphedema, not elsewhere classified: Secondary | ICD-10-CM | POA: Diagnosis not present

## 2019-10-21 DIAGNOSIS — R262 Difficulty in walking, not elsewhere classified: Secondary | ICD-10-CM | POA: Diagnosis not present

## 2019-10-21 DIAGNOSIS — M6281 Muscle weakness (generalized): Secondary | ICD-10-CM | POA: Diagnosis not present

## 2019-10-21 DIAGNOSIS — I890 Lymphedema, not elsewhere classified: Secondary | ICD-10-CM | POA: Diagnosis not present

## 2019-10-21 DIAGNOSIS — R296 Repeated falls: Secondary | ICD-10-CM | POA: Diagnosis not present

## 2019-10-22 DIAGNOSIS — M6281 Muscle weakness (generalized): Secondary | ICD-10-CM | POA: Diagnosis not present

## 2019-10-22 DIAGNOSIS — R296 Repeated falls: Secondary | ICD-10-CM | POA: Diagnosis not present

## 2019-10-22 DIAGNOSIS — R262 Difficulty in walking, not elsewhere classified: Secondary | ICD-10-CM | POA: Diagnosis not present

## 2019-10-22 DIAGNOSIS — I890 Lymphedema, not elsewhere classified: Secondary | ICD-10-CM | POA: Diagnosis not present

## 2019-10-25 DIAGNOSIS — I890 Lymphedema, not elsewhere classified: Secondary | ICD-10-CM | POA: Diagnosis not present

## 2019-10-25 DIAGNOSIS — R262 Difficulty in walking, not elsewhere classified: Secondary | ICD-10-CM | POA: Diagnosis not present

## 2019-10-25 DIAGNOSIS — R296 Repeated falls: Secondary | ICD-10-CM | POA: Diagnosis not present

## 2019-10-25 DIAGNOSIS — M6281 Muscle weakness (generalized): Secondary | ICD-10-CM | POA: Diagnosis not present

## 2019-10-27 DIAGNOSIS — I890 Lymphedema, not elsewhere classified: Secondary | ICD-10-CM | POA: Diagnosis not present

## 2019-10-27 DIAGNOSIS — M6281 Muscle weakness (generalized): Secondary | ICD-10-CM | POA: Diagnosis not present

## 2019-10-27 DIAGNOSIS — R296 Repeated falls: Secondary | ICD-10-CM | POA: Diagnosis not present

## 2019-10-27 DIAGNOSIS — R262 Difficulty in walking, not elsewhere classified: Secondary | ICD-10-CM | POA: Diagnosis not present

## 2019-10-28 DIAGNOSIS — R262 Difficulty in walking, not elsewhere classified: Secondary | ICD-10-CM | POA: Diagnosis not present

## 2019-10-28 DIAGNOSIS — I890 Lymphedema, not elsewhere classified: Secondary | ICD-10-CM | POA: Diagnosis not present

## 2019-10-28 DIAGNOSIS — M6281 Muscle weakness (generalized): Secondary | ICD-10-CM | POA: Diagnosis not present

## 2019-10-28 DIAGNOSIS — R296 Repeated falls: Secondary | ICD-10-CM | POA: Diagnosis not present

## 2019-10-28 DIAGNOSIS — R3129 Other microscopic hematuria: Secondary | ICD-10-CM | POA: Diagnosis not present

## 2019-10-29 DIAGNOSIS — I890 Lymphedema, not elsewhere classified: Secondary | ICD-10-CM | POA: Diagnosis not present

## 2019-10-29 DIAGNOSIS — R296 Repeated falls: Secondary | ICD-10-CM | POA: Diagnosis not present

## 2019-10-29 DIAGNOSIS — R262 Difficulty in walking, not elsewhere classified: Secondary | ICD-10-CM | POA: Diagnosis not present

## 2019-10-29 DIAGNOSIS — M6281 Muscle weakness (generalized): Secondary | ICD-10-CM | POA: Diagnosis not present

## 2019-11-03 DIAGNOSIS — M6281 Muscle weakness (generalized): Secondary | ICD-10-CM | POA: Diagnosis not present

## 2019-11-03 DIAGNOSIS — I890 Lymphedema, not elsewhere classified: Secondary | ICD-10-CM | POA: Diagnosis not present

## 2019-11-03 DIAGNOSIS — R262 Difficulty in walking, not elsewhere classified: Secondary | ICD-10-CM | POA: Diagnosis not present

## 2019-11-03 DIAGNOSIS — R296 Repeated falls: Secondary | ICD-10-CM | POA: Diagnosis not present

## 2019-11-04 DIAGNOSIS — I890 Lymphedema, not elsewhere classified: Secondary | ICD-10-CM | POA: Diagnosis not present

## 2019-11-04 DIAGNOSIS — R262 Difficulty in walking, not elsewhere classified: Secondary | ICD-10-CM | POA: Diagnosis not present

## 2019-11-04 DIAGNOSIS — R296 Repeated falls: Secondary | ICD-10-CM | POA: Diagnosis not present

## 2019-11-04 DIAGNOSIS — M6281 Muscle weakness (generalized): Secondary | ICD-10-CM | POA: Diagnosis not present

## 2019-11-07 DIAGNOSIS — M10041 Idiopathic gout, right hand: Secondary | ICD-10-CM | POA: Diagnosis not present

## 2019-11-09 DIAGNOSIS — R296 Repeated falls: Secondary | ICD-10-CM | POA: Diagnosis not present

## 2019-11-09 DIAGNOSIS — R262 Difficulty in walking, not elsewhere classified: Secondary | ICD-10-CM | POA: Diagnosis not present

## 2019-11-09 DIAGNOSIS — M6281 Muscle weakness (generalized): Secondary | ICD-10-CM | POA: Diagnosis not present

## 2019-11-09 DIAGNOSIS — I890 Lymphedema, not elsewhere classified: Secondary | ICD-10-CM | POA: Diagnosis not present

## 2019-11-10 DIAGNOSIS — I890 Lymphedema, not elsewhere classified: Secondary | ICD-10-CM | POA: Diagnosis not present

## 2019-11-10 DIAGNOSIS — R296 Repeated falls: Secondary | ICD-10-CM | POA: Diagnosis not present

## 2019-11-10 DIAGNOSIS — M6281 Muscle weakness (generalized): Secondary | ICD-10-CM | POA: Diagnosis not present

## 2019-11-10 DIAGNOSIS — R262 Difficulty in walking, not elsewhere classified: Secondary | ICD-10-CM | POA: Diagnosis not present

## 2019-11-12 DIAGNOSIS — M6281 Muscle weakness (generalized): Secondary | ICD-10-CM | POA: Diagnosis not present

## 2019-11-12 DIAGNOSIS — R262 Difficulty in walking, not elsewhere classified: Secondary | ICD-10-CM | POA: Diagnosis not present

## 2019-11-12 DIAGNOSIS — I890 Lymphedema, not elsewhere classified: Secondary | ICD-10-CM | POA: Diagnosis not present

## 2019-11-12 DIAGNOSIS — R296 Repeated falls: Secondary | ICD-10-CM | POA: Diagnosis not present

## 2019-11-13 DIAGNOSIS — L089 Local infection of the skin and subcutaneous tissue, unspecified: Secondary | ICD-10-CM | POA: Diagnosis not present

## 2019-11-15 DIAGNOSIS — R262 Difficulty in walking, not elsewhere classified: Secondary | ICD-10-CM | POA: Diagnosis not present

## 2019-11-15 DIAGNOSIS — M6281 Muscle weakness (generalized): Secondary | ICD-10-CM | POA: Diagnosis not present

## 2019-11-15 DIAGNOSIS — R296 Repeated falls: Secondary | ICD-10-CM | POA: Diagnosis not present

## 2019-11-15 DIAGNOSIS — I890 Lymphedema, not elsewhere classified: Secondary | ICD-10-CM | POA: Diagnosis not present

## 2019-11-16 ENCOUNTER — Other Ambulatory Visit: Payer: Self-pay

## 2019-11-16 ENCOUNTER — Encounter: Payer: Self-pay | Admitting: Family

## 2019-11-16 ENCOUNTER — Ambulatory Visit (INDEPENDENT_AMBULATORY_CARE_PROVIDER_SITE_OTHER): Payer: Medicare PPO | Admitting: Family

## 2019-11-16 VITALS — BP 120/80 | HR 65 | Temp 97.7°F | Resp 16 | Ht 67.0 in | Wt 118.6 lb

## 2019-11-16 DIAGNOSIS — M7989 Other specified soft tissue disorders: Secondary | ICD-10-CM

## 2019-11-16 DIAGNOSIS — M79644 Pain in right finger(s): Secondary | ICD-10-CM | POA: Diagnosis not present

## 2019-11-16 NOTE — Patient Instructions (Signed)
-   Soak index finger in warm water and epsom salt at least 10 minutes twice daily.  - Take prescribed tapered prednisone as directed - Lab done today will call you with results. - Notify provider if symptoms worsen.

## 2019-11-17 LAB — CBC WITH DIFFERENTIAL/PLATELET
Absolute Monocytes: 690 cells/uL (ref 200–950)
Basophils Absolute: 21 cells/uL (ref 0–200)
Basophils Relative: 0.3 %
Eosinophils Absolute: 48 cells/uL (ref 15–500)
Eosinophils Relative: 0.7 %
HCT: 44.8 % (ref 35.0–45.0)
Hemoglobin: 14.7 g/dL (ref 11.7–15.5)
Lymphs Abs: 766 cells/uL — ABNORMAL LOW (ref 850–3900)
MCH: 29.8 pg (ref 27.0–33.0)
MCHC: 32.8 g/dL (ref 32.0–36.0)
MCV: 90.9 fL (ref 80.0–100.0)
MPV: 10 fL (ref 7.5–12.5)
Monocytes Relative: 10 %
Neutro Abs: 5375 cells/uL (ref 1500–7800)
Neutrophils Relative %: 77.9 %
Platelets: 272 10*3/uL (ref 140–400)
RBC: 4.93 10*6/uL (ref 3.80–5.10)
RDW: 14.3 % (ref 11.0–15.0)
Total Lymphocyte: 11.1 %
WBC: 6.9 10*3/uL (ref 3.8–10.8)

## 2019-11-17 LAB — SEDIMENTATION RATE: Sed Rate: 14 mm/h (ref 0–30)

## 2019-11-17 LAB — URIC ACID: Uric Acid, Serum: 8.1 mg/dL — ABNORMAL HIGH (ref 2.5–7.0)

## 2019-11-19 DIAGNOSIS — I890 Lymphedema, not elsewhere classified: Secondary | ICD-10-CM | POA: Diagnosis not present

## 2019-11-19 DIAGNOSIS — R262 Difficulty in walking, not elsewhere classified: Secondary | ICD-10-CM | POA: Diagnosis not present

## 2019-11-19 DIAGNOSIS — M6281 Muscle weakness (generalized): Secondary | ICD-10-CM | POA: Diagnosis not present

## 2019-11-19 DIAGNOSIS — R296 Repeated falls: Secondary | ICD-10-CM | POA: Diagnosis not present

## 2019-11-21 NOTE — Progress Notes (Signed)
Provider: Corin Tilly FNP-C  Gayland Curry, DO  Patient Care Team: Gayland Curry, DO as PCP - General (Geriatric Medicine) Monna Fam, MD as Consulting Physician (Ophthalmology) Debara Pickett Nadean Corwin, MD as Consulting Physician (Cardiology) [name], MD as Consulting Physician (Orthopedic Surgery)  Extended Emergency Contact Information Primary Emergency Contact: Tischer,Steve Address: [address] [place] [address] Johnnette Litter of [place] Phone: [phone] Relation: Son Secondary Emergency Contact: Davis,Chandra Address: [address]          [place], [place] [address] Johnnette Litter of [place] Mobile Phone: [phone] Relation: Daughter  Code Status:  DNR Goals of care: Advanced Directive information Advanced Directives 11/16/2019  Does Patient Have a Medical Advance Directive? Yes  Type of Advance Directive Living will;Healthcare Power of [place];Out of facility DNR (pink MOST or yellow form)  Does patient want to make changes to medical advance directive? No - Patient declined  Copy of [place] in Chart? Yes - validated most recent copy scanned in chart (See row information)  Pre-existing out of facility DNR order (yellow form or pink MOST form) -     Chief Complaint  Patient presents with  . Acute Visit    Tophus Evalutation    HPI:  Pt is a 86 y.o. female seen today for an acute visit for evaluation of right index finger swelling and pain x 10 days.Has gone to urgent care she was seen 11/13/2019 she was given Prednisone but states has not taken it " It makes me crazy hyper".Currently on Keflex 500 mg tablet twice daily on day # 3.Has had no fever but felt like she had one episode of chills.No chills today.  Past Medical History:  Diagnosis Date  . Arthritis    oa  . Chronic kidney disease    chronic kidney diseaase follow by primary md  . Diabetes mellitus without complication ([place])    Diet controlled   . Former smoker   . H/O ventricular fibrillation 10 yrs ago dx  . Hypertension   . Prediabetes   . Seasonal allergies   . TIA (transient ischemic attack) none recent   Past Surgical History:  Procedure Laterality Date  . NM MYOCAR PERF WALL MOTION  01/2008   lexiscan myoview - normal pattern of perfusion in all regions, EF 92%, no significant ischemia demonstated  . TONSILLECTOMY  age 86   and adenoids  . TOTAL KNEE ARTHROPLASTY Right 10/06/2015   Procedure: RIGHT TOTAL KNEE ARTHROPLASTY;  Surgeon: [name], MD;  Location: [facility];  Service: Orthopedics;  Laterality: Right;  . TRANSTHORACIC ECHOCARDIOGRAM  12/2005   mild DUST, EF=>55%; mild mitral annular calcif; mild-mod aortic root calcif    No Known Allergies  Outpatient Encounter Medications as of 11/16/2019  Medication Sig  . cephALEXin (KEFLEX) 500 MG capsule Take 1 capsule by mouth 2 (two) times daily.  . cholecalciferol (VITAMIN D) 1000 UNITS tablet Take 1,000 Units by mouth daily. D3  . clopidogrel (PLAVIX) 75 MG tablet TAKE 1 TABLET(75 MG) BY MOUTH DAILY  . hydrochlorothiazide (HYDRODIURIL) 12.5 MG tablet TAKE 1 TABLET BY MOUTH EVERY OTHER DAY  . ketorolac (ACULAR) 0.5 % ophthalmic solution Place 1 drop into the right eye 2 (two) times daily.  . metoprolol succinate (TOPROL-XL) 25 MG 24 hr tablet Take one tablet by mouth once daily  . potassium chloride (KLOR-CON) 10 MEQ tablet TAKE 1 TABLET(10 MEQ) BY MOUTH DAILY  No facility-administered encounter medications on file as of 11/16/2019.    Review of Systems  Constitutional: Positive for fatigue. Negative for appetite change, chills and fever.  Respiratory: Negative for cough, chest tightness, shortness of breath and wheezing.   Cardiovascular: Negative for chest pain, palpitations and leg swelling.  Gastrointestinal: Negative for abdominal distention, abdominal pain, constipation, diarrhea, nausea and vomiting.  Musculoskeletal: Positive for arthralgias,  gait problem and joint swelling. Negative for myalgias, neck pain and neck stiffness.  Skin: Negative for color change, pallor and rash.  Neurological: Negative for dizziness, light-headedness and headaches.    Immunization History  Administered Date(s) Administered  . Fluad Quad(high Dose 65+) 02/11/2019  . Influenza, High Dose Seasonal PF 01/27/2016, 02/18/2018  . Influenza,inj,Quad PF,6+ Mos 02/10/2017  . Influenza-Unspecified 02/17/2013, 01/18/2014, 12/31/2014  . Moderna SARS-COVID-2 Vaccination 06/07/2019, 07/05/2019  . Pneumococcal Conjugate-13 07/15/2014  . Pneumococcal Polysaccharide-23 05/20/2010  . Tdap 07/09/2015  . Zoster 05/20/2010   Pertinent  Health Maintenance Due  Topic Date Due  . URINE MICROALBUMIN  Never done  . DEXA SCAN  10/03/2020 (Originally 09/25/1992)  . INFLUENZA VACCINE  12/19/2019  . PNA vac Low Risk Adult  Completed   Fall Risk  11/16/2019 10/11/2019 10/04/2019 10/01/2019 07/30/2019  Falls in the past year? 0 0 0 0 0  Number falls in past yr: 0 0 0 0 0  Comment - - - - -  Injury with Fall? 0 0 0 0 0  Risk Factor Category  - - - - -  Risk for fall due to : - - - - -  Follow up - - - - -  Comment - - - - -    Vitals:   11/16/19 1007  BP: 120/80  Pulse: 65  Resp: 16  Temp: 97.7 F (36.5 C)  SpO2: 95%  Weight: 118 lb 9.6 oz (53.8 kg)  Height: 5\' 7"  (1.702 m)   Body mass index is 18.58 kg/m. Physical Exam Vitals reviewed.  Constitutional:      Appearance: She is underweight.  HENT:     Mouth/Throat:     Mouth: Mucous membranes are moist.     Pharynx: No oropharyngeal exudate or posterior oropharyngeal erythema.  Eyes:     General: No scleral icterus.       Right eye: No discharge.        Left eye: No discharge.     Conjunctiva/sclera: Conjunctivae normal.     Pupils: Pupils are equal, round, and reactive to light.  Cardiovascular:     Rate and Rhythm: Normal rate and regular rhythm.     Pulses: Normal pulses.     Heart sounds: Normal  heart sounds. No murmur heard.  No friction rub. No gallop.   Pulmonary:     Effort: Pulmonary effort is normal. No respiratory distress.     Breath sounds: Normal breath sounds. No wheezing, rhonchi or rales.  Chest:     Chest wall: No tenderness.  Abdominal:     General: Bowel sounds are normal. There is no distension.     Palpations: There is no mass.     Tenderness: There is no abdominal tenderness. There is no right CVA tenderness, left CVA tenderness, guarding or rebound.  Musculoskeletal:     Comments: Right index finger swollen tender to touch with whitish patch noted on lateral tip of finger.  Unsteady gait walks with Rolator  Skin:    General: Skin is warm.     Coloration: Skin is not  pale.     Findings: No bruising, erythema or rash.  Neurological:     Mental Status: She is alert and oriented to person, place, and time.     Cranial Nerves: No cranial nerve deficit.     Motor: No weakness.     Gait: Gait abnormal.  Psychiatric:        Mood and Affect: Mood normal.        Behavior: Behavior normal.        Thought Content: Thought content normal.        Judgment: Judgment normal.   Labs reviewed: Recent Labs    12/04/18 1022  NA 143  K 3.9  CL 104  CO2 29  GLUCOSE 78  BUN 27*  CREATININE 1.71*  CALCIUM 9.8   Recent Labs    12/04/18 1022  AST 21  ALT 12  BILITOT 0.6  PROT 6.6   Recent Labs    12/04/18 1022 04/05/19 1352 11/16/19 1130  WBC 6.2 5.3 6.9  NEUTROABS 4,706 4,033 5,375  HGB 15.1 13.5 14.7  HCT 45.8* 40.6 44.8  MCV 92.7 92.5 90.9  PLT 194 211 272   Lab Results  Component Value Date   TSH 2.770 09/04/2015   Lab Results  Component Value Date   HGBA1C 6.1 (H) 12/04/2018   Lab Results  Component Value Date   CHOL 151 09/04/2015   HDL 85 09/04/2015   LDLCALC 53 09/04/2015   TRIG 66 09/04/2015   CHOLHDL 1.8 09/04/2015    Significant Diagnostic Results in last 30 days:  No results found.  Assessment/Plan 1. Swelling of right  index finger Afebrile. - Advised to complete current course of Keflex. - Soak index finger in warm water and epsom salt at least 10 minutes twice daily.  - Take prescribed tapered prednisone as directed - CBC with Differential/Platelet - Uric Acid - Sedimentation Rate - Lab done today will call you with results. - Notify provider if symptoms worsen.   2. Pain of finger of right hand -- Soak index finger in warm water and epsom salt at least 10 minutes twice daily. - OTC Tylenol 500 mg tablet every 6-8 Hrs as needed for pain.  - Take prescribed tapered prednisone as directed  - CBC with Differential/Platelet - Uric Acid - Sedimentation Rate  Family/ staff Communication: Reviewed plan of care with patient verbalized understanding.  Labs/tests ordered: None   Next Appointment: As needed if symptoms worsen or fail to improve   Sandrea Hughs, NP

## 2019-11-22 DIAGNOSIS — R262 Difficulty in walking, not elsewhere classified: Secondary | ICD-10-CM | POA: Diagnosis not present

## 2019-11-22 DIAGNOSIS — I890 Lymphedema, not elsewhere classified: Secondary | ICD-10-CM | POA: Diagnosis not present

## 2019-11-22 DIAGNOSIS — M6281 Muscle weakness (generalized): Secondary | ICD-10-CM | POA: Diagnosis not present

## 2019-11-22 DIAGNOSIS — R296 Repeated falls: Secondary | ICD-10-CM | POA: Diagnosis not present

## 2019-11-23 DIAGNOSIS — R262 Difficulty in walking, not elsewhere classified: Secondary | ICD-10-CM | POA: Diagnosis not present

## 2019-11-23 DIAGNOSIS — R296 Repeated falls: Secondary | ICD-10-CM | POA: Diagnosis not present

## 2019-11-23 DIAGNOSIS — I890 Lymphedema, not elsewhere classified: Secondary | ICD-10-CM | POA: Diagnosis not present

## 2019-11-23 DIAGNOSIS — M6281 Muscle weakness (generalized): Secondary | ICD-10-CM | POA: Diagnosis not present

## 2019-11-24 DIAGNOSIS — R296 Repeated falls: Secondary | ICD-10-CM | POA: Diagnosis not present

## 2019-11-24 DIAGNOSIS — M6281 Muscle weakness (generalized): Secondary | ICD-10-CM | POA: Diagnosis not present

## 2019-11-24 DIAGNOSIS — R262 Difficulty in walking, not elsewhere classified: Secondary | ICD-10-CM | POA: Diagnosis not present

## 2019-11-24 DIAGNOSIS — I890 Lymphedema, not elsewhere classified: Secondary | ICD-10-CM | POA: Diagnosis not present

## 2019-11-25 DIAGNOSIS — M6281 Muscle weakness (generalized): Secondary | ICD-10-CM | POA: Diagnosis not present

## 2019-11-25 DIAGNOSIS — R262 Difficulty in walking, not elsewhere classified: Secondary | ICD-10-CM | POA: Diagnosis not present

## 2019-11-25 DIAGNOSIS — I890 Lymphedema, not elsewhere classified: Secondary | ICD-10-CM | POA: Diagnosis not present

## 2019-11-25 DIAGNOSIS — R296 Repeated falls: Secondary | ICD-10-CM | POA: Diagnosis not present

## 2019-11-29 DIAGNOSIS — R262 Difficulty in walking, not elsewhere classified: Secondary | ICD-10-CM | POA: Diagnosis not present

## 2019-11-29 DIAGNOSIS — I890 Lymphedema, not elsewhere classified: Secondary | ICD-10-CM | POA: Diagnosis not present

## 2019-11-29 DIAGNOSIS — R296 Repeated falls: Secondary | ICD-10-CM | POA: Diagnosis not present

## 2019-11-29 DIAGNOSIS — M6281 Muscle weakness (generalized): Secondary | ICD-10-CM | POA: Diagnosis not present

## 2019-11-30 DIAGNOSIS — I890 Lymphedema, not elsewhere classified: Secondary | ICD-10-CM | POA: Diagnosis not present

## 2019-11-30 DIAGNOSIS — M6281 Muscle weakness (generalized): Secondary | ICD-10-CM | POA: Diagnosis not present

## 2019-11-30 DIAGNOSIS — R262 Difficulty in walking, not elsewhere classified: Secondary | ICD-10-CM | POA: Diagnosis not present

## 2019-11-30 DIAGNOSIS — R296 Repeated falls: Secondary | ICD-10-CM | POA: Diagnosis not present

## 2019-12-01 DIAGNOSIS — R262 Difficulty in walking, not elsewhere classified: Secondary | ICD-10-CM | POA: Diagnosis not present

## 2019-12-01 DIAGNOSIS — I890 Lymphedema, not elsewhere classified: Secondary | ICD-10-CM | POA: Diagnosis not present

## 2019-12-01 DIAGNOSIS — M6281 Muscle weakness (generalized): Secondary | ICD-10-CM | POA: Diagnosis not present

## 2019-12-01 DIAGNOSIS — R296 Repeated falls: Secondary | ICD-10-CM | POA: Diagnosis not present

## 2019-12-02 DIAGNOSIS — R296 Repeated falls: Secondary | ICD-10-CM | POA: Diagnosis not present

## 2019-12-02 DIAGNOSIS — R262 Difficulty in walking, not elsewhere classified: Secondary | ICD-10-CM | POA: Diagnosis not present

## 2019-12-02 DIAGNOSIS — I890 Lymphedema, not elsewhere classified: Secondary | ICD-10-CM | POA: Diagnosis not present

## 2019-12-02 DIAGNOSIS — M6281 Muscle weakness (generalized): Secondary | ICD-10-CM | POA: Diagnosis not present

## 2019-12-06 DIAGNOSIS — R262 Difficulty in walking, not elsewhere classified: Secondary | ICD-10-CM | POA: Diagnosis not present

## 2019-12-06 DIAGNOSIS — R296 Repeated falls: Secondary | ICD-10-CM | POA: Diagnosis not present

## 2019-12-06 DIAGNOSIS — I890 Lymphedema, not elsewhere classified: Secondary | ICD-10-CM | POA: Diagnosis not present

## 2019-12-06 DIAGNOSIS — M6281 Muscle weakness (generalized): Secondary | ICD-10-CM | POA: Diagnosis not present

## 2019-12-08 DIAGNOSIS — R296 Repeated falls: Secondary | ICD-10-CM | POA: Diagnosis not present

## 2019-12-08 DIAGNOSIS — M6281 Muscle weakness (generalized): Secondary | ICD-10-CM | POA: Diagnosis not present

## 2019-12-08 DIAGNOSIS — R262 Difficulty in walking, not elsewhere classified: Secondary | ICD-10-CM | POA: Diagnosis not present

## 2019-12-08 DIAGNOSIS — I890 Lymphedema, not elsewhere classified: Secondary | ICD-10-CM | POA: Diagnosis not present

## 2019-12-10 DIAGNOSIS — I890 Lymphedema, not elsewhere classified: Secondary | ICD-10-CM | POA: Diagnosis not present

## 2019-12-10 DIAGNOSIS — R262 Difficulty in walking, not elsewhere classified: Secondary | ICD-10-CM | POA: Diagnosis not present

## 2019-12-10 DIAGNOSIS — R296 Repeated falls: Secondary | ICD-10-CM | POA: Diagnosis not present

## 2019-12-10 DIAGNOSIS — M6281 Muscle weakness (generalized): Secondary | ICD-10-CM | POA: Diagnosis not present

## 2019-12-13 ENCOUNTER — Other Ambulatory Visit: Payer: Self-pay

## 2019-12-13 ENCOUNTER — Telehealth: Payer: Self-pay | Admitting: *Deleted

## 2019-12-13 ENCOUNTER — Encounter: Payer: Self-pay | Admitting: Internal Medicine

## 2019-12-13 ENCOUNTER — Ambulatory Visit (INDEPENDENT_AMBULATORY_CARE_PROVIDER_SITE_OTHER): Payer: Medicare PPO | Admitting: Internal Medicine

## 2019-12-13 VITALS — BP 136/82 | HR 76 | Ht 67.0 in | Wt 122.0 lb

## 2019-12-13 DIAGNOSIS — N184 Chronic kidney disease, stage 4 (severe): Secondary | ICD-10-CM | POA: Diagnosis not present

## 2019-12-13 DIAGNOSIS — M10341 Gout due to renal impairment, right hand: Secondary | ICD-10-CM | POA: Diagnosis not present

## 2019-12-13 LAB — BASIC METABOLIC PANEL
BUN/Creatinine Ratio: 16 (calc) (ref 6–22)
BUN: 26 mg/dL — ABNORMAL HIGH (ref 7–25)
CO2: 24 mmol/L (ref 20–32)
Calcium: 9.5 mg/dL (ref 8.6–10.4)
Chloride: 107 mmol/L (ref 98–110)
Creat: 1.66 mg/dL — ABNORMAL HIGH (ref 0.60–0.88)
Glucose, Bld: 97 mg/dL (ref 65–139)
Potassium: 4.1 mmol/L (ref 3.5–5.3)
Sodium: 140 mmol/L (ref 135–146)

## 2019-12-13 LAB — URIC ACID: Uric Acid, Serum: 7.4 mg/dL — ABNORMAL HIGH (ref 2.5–7.0)

## 2019-12-13 MED ORDER — COLCHICINE 0.6 MG PO TABS: ORAL_TABLET | ORAL | 2 refills | Status: DC

## 2019-12-13 NOTE — Progress Notes (Signed)
Location:  [facility] clinic Provider: Kyston Gonce L. Mariea Clonts, D.O., C.M.D.  Code Status: DNR Goals of Care:  Advanced Directives 12/13/2019  Does Patient Have a Medical Advance Directive? Yes  Type of Advance Directive Out of facility DNR (pink MOST or yellow form)  Does patient want to make changes to medical advance directive? No - Patient declined  Copy of [place] in Chart? Yes - validated most recent copy scanned in chart (See row information)  Pre-existing out of facility DNR order (yellow form or pink MOST form) -     Chief Complaint  Patient presents with  . Acute Visit    R hand swollen and she would like a referral to Rhumatolgist     HPI: Patient is a 86 y.o. female seen today for an acute visit for gout of her right 2nd and 3rd digit off and on for a month.  She's been taken to walk-in places in [facility] 6/20.  She's been on steroids after 6/26--did not take at first, but eventually did.  She has been diagnosed with gout.  Takes tylenol and was given other meds for pain.  Also had dots that looked pus-filled, but were not open and a bandaid was put on the palmar surface of her her distal index finger.  She was given keflex '500mg'$  po bid for that and was on day 3 6/29 when seen here.  Uric acid was 8.1.  Goal is 6.  ESR was 14 only that day while on steroids.  She finished the complete course.    The pain got worse until the weekend.  Has not had any xray.  Using tylenol for pain.    She would like to see a rheumatologist--[phone] is the number she googled.  They will be close to where she is staying now--[facility].    She has an appointment with nephrology b/c of a small cyst on her kidney.  Sees them last part of august.  She is more confused than baseline--repeating herself some and notes it herself that she's mixed up.  Past Medical History:  Diagnosis Date  . Arthritis    oa  . Chronic kidney disease    chronic kidney diseaase follow by primary  md  . Diabetes mellitus without complication ([place])    Diet controlled  . Former smoker   . H/O ventricular fibrillation 10 yrs ago dx  . Hypertension   . Prediabetes   . Seasonal allergies   . TIA (transient ischemic attack) none recent    Past Surgical History:  Procedure Laterality Date  . NM MYOCAR PERF WALL MOTION  01/2008   lexiscan myoview - normal pattern of perfusion in all regions, EF 92%, no significant ischemia demonstated  . TONSILLECTOMY  age 5   and adenoids  . TOTAL KNEE ARTHROPLASTY Right 10/06/2015   Procedure: RIGHT TOTAL KNEE ARTHROPLASTY;  Surgeon: [name], MD;  Location: [facility];  Service: Orthopedics;  Laterality: Right;  . TRANSTHORACIC ECHOCARDIOGRAM  12/2005   mild DUST, EF=>55%; mild mitral annular calcif; mild-mod aortic root calcif    No Known Allergies  Outpatient Encounter Medications as of 12/13/2019  Medication Sig  . cholecalciferol (VITAMIN D) 1000 UNITS tablet Take 1,000 Units by mouth daily. D3  . clopidogrel (PLAVIX) 75 MG tablet TAKE 1 TABLET(75 MG) BY MOUTH DAILY  . hydrochlorothiazide (HYDRODIURIL) 12.5 MG tablet TAKE 1 TABLET BY MOUTH EVERY OTHER DAY  . ketorolac (ACULAR) 0.5 % ophthalmic solution Place 1 drop  into the right eye 2 (two) times daily.  . metoprolol succinate (TOPROL-XL) 25 MG 24 hr tablet Take one tablet by mouth once daily  . potassium chloride (KLOR-CON) 10 MEQ tablet TAKE 1 TABLET(10 MEQ) BY MOUTH DAILY   No facility-administered encounter medications on file as of 12/13/2019.    Review of Systems:  ROS  Health Maintenance  Topic Date Due  . URINE MICROALBUMIN  Never done  . DEXA SCAN  10/03/2020 (Originally 09/25/1992)  . INFLUENZA VACCINE  12/19/2019  . TETANUS/TDAP  07/08/2025  . COVID-19 Vaccine  Completed  . PNA vac Low Risk Adult  Completed    Physical Exam: Vitals:   12/13/19 1318  BP: (!) 136/82  Pulse: 76  SpO2: 91%  Weight: 122 lb (55.3 kg)  Height: '5\' 7"'$  (1.702 m)   Body mass index  is 19.11 kg/m. Physical Exam  Labs reviewed: Basic Metabolic Panel: No results for input(s): NA, K, CL, CO2, GLUCOSE, BUN, CREATININE, CALCIUM, MG, PHOS, TSH in the last 8760 hours. Liver Function Tests: No results for input(s): AST, ALT, ALKPHOS, BILITOT, PROT, ALBUMIN in the last 8760 hours. No results for input(s): LIPASE, AMYLASE in the last 8760 hours. No results for input(s): AMMONIA in the last 8760 hours. CBC: Recent Labs    04/05/19 1352 11/16/19 1130  WBC 5.3 6.9  NEUTROABS 4,033 5,375  HGB 13.5 14.7  HCT 40.6 44.8  MCV 92.5 90.9  PLT 211 272   Lipid Panel: No results for input(s): CHOL, HDL, LDLCALC, TRIG, CHOLHDL, LDLDIRECT in the last 8760 hours. Lab Results  Component Value Date   HGBA1C 6.1 (H) 12/04/2018    Procedures since last visit: No results found.  Assessment/Plan 1. Acute gout due to renal impairment involving right hand - seems this is several weeks long now and she's only been treated with prednisone, never any colchicine or started on any chronic gout medication -she does have CKD4 w/o recent creatinine or gfr so will check -last uric acid high - DG Hand Complete Right; Future ordered for today--son to take her after this - Uric Acid done today - Ambulatory referral to Rheumatology at her request  2. CKD (chronic kidney disease) stage 4, GFR 15-29 ml/min (HCC) -Avoid nephrotoxic agents like nsaids, dose adjust renally excreted meds, hydrate. - Basic metabolic panel  Labs/tests ordered:   Orders Placed This Encounter  Procedures  . DG Hand Complete Right    Standing Status:   Future    Standing Expiration Date:   12/12/2020    Order Specific Question:   Reason for Exam (SYMPTOM  OR DIAGNOSIS REQUIRED)    Answer:   right 2nd and 3rd finger inflammatory arthritis (gout)    Order Specific Question:   Preferred imaging location?    Answer:   [facility]    Order Specific Question:   Radiology Contrast Protocol - do NOT remove file path     Answer:   [email or web address]  . Basic metabolic panel    Order Specific Question:   Has the patient fasted?    Answer:   No    Order Specific Question:   Release to patient    Answer:   Immediate  . Uric Acid  . Ambulatory referral to Rheumatology    Referral Priority:   Routine    Referral Type:   Consultation    Referral Reason:   Specialty Services Required    Requested Specialty:   Rheumatology    Number of Visits Requested:  1    Next appt:  3 mos med mgt, come fasting for same day labs   Tarin Navarez L. Abdulahi Schor, D.O. [facility] [address], [place] [address] Cell Phone (Mon-Fri 8am-5pm):  [phone] On Call:  [phone] & follow prompts after 5pm & weekends Office Phone:  [phone] Office Fax:  [phone]

## 2019-12-13 NOTE — Telephone Encounter (Signed)
Mia [name], son called and stated that patient just finished seeing Dr. Mariea Clonts and received a printout stating that she had Stage 4 CKD. Son is wanting to know if this is being treated.  Please Advise.

## 2019-12-13 NOTE — Telephone Encounter (Signed)
Patient notified and agreed.  

## 2019-12-13 NOTE — Telephone Encounter (Signed)
The chronic kidney disease is being monitored carefully.  She had not seen me in several months and the last time, she had labs, that's where her kidney function was.  I have actually rechecked it today because my impression is that is the reason her gout has flared up.

## 2019-12-14 ENCOUNTER — Ambulatory Visit
Admission: RE | Admit: 2019-12-14 | Discharge: 2019-12-14 | Disposition: A | Discharge status: Home / Self Care | Payer: Medicare PPO | Source: Ambulatory Visit | Attending: Internal Medicine | Admitting: Internal Medicine

## 2019-12-14 ENCOUNTER — Telehealth: Payer: Self-pay | Admitting: *Deleted

## 2019-12-14 ENCOUNTER — Other Ambulatory Visit: Payer: Self-pay

## 2019-12-14 ENCOUNTER — Encounter: Payer: Self-pay | Admitting: Internal Medicine

## 2019-12-14 DIAGNOSIS — N184 Chronic kidney disease, stage 4 (severe): Secondary | ICD-10-CM

## 2019-12-14 DIAGNOSIS — I890 Lymphedema, not elsewhere classified: Secondary | ICD-10-CM | POA: Diagnosis not present

## 2019-12-14 DIAGNOSIS — R262 Difficulty in walking, not elsewhere classified: Secondary | ICD-10-CM | POA: Diagnosis not present

## 2019-12-14 DIAGNOSIS — M7989 Other specified soft tissue disorders: Secondary | ICD-10-CM | POA: Diagnosis not present

## 2019-12-14 DIAGNOSIS — M10341 Gout due to renal impairment, right hand: Secondary | ICD-10-CM

## 2019-12-14 DIAGNOSIS — M6281 Muscle weakness (generalized): Secondary | ICD-10-CM | POA: Diagnosis not present

## 2019-12-14 DIAGNOSIS — M19041 Primary osteoarthritis, right hand: Secondary | ICD-10-CM | POA: Diagnosis not present

## 2019-12-14 DIAGNOSIS — R296 Repeated falls: Secondary | ICD-10-CM | POA: Diagnosis not present

## 2019-12-14 IMAGING — CR DG HAND COMPLETE 3+V*R*
3 series · 3 of 3 positions shown · non-contrast
Comparison: None.

CLINICAL DATA: Second and third finger arthritis and swelling

EXAM:
RIGHT HAND - COMPLETE 3+ VIEW

[x hand pa right]
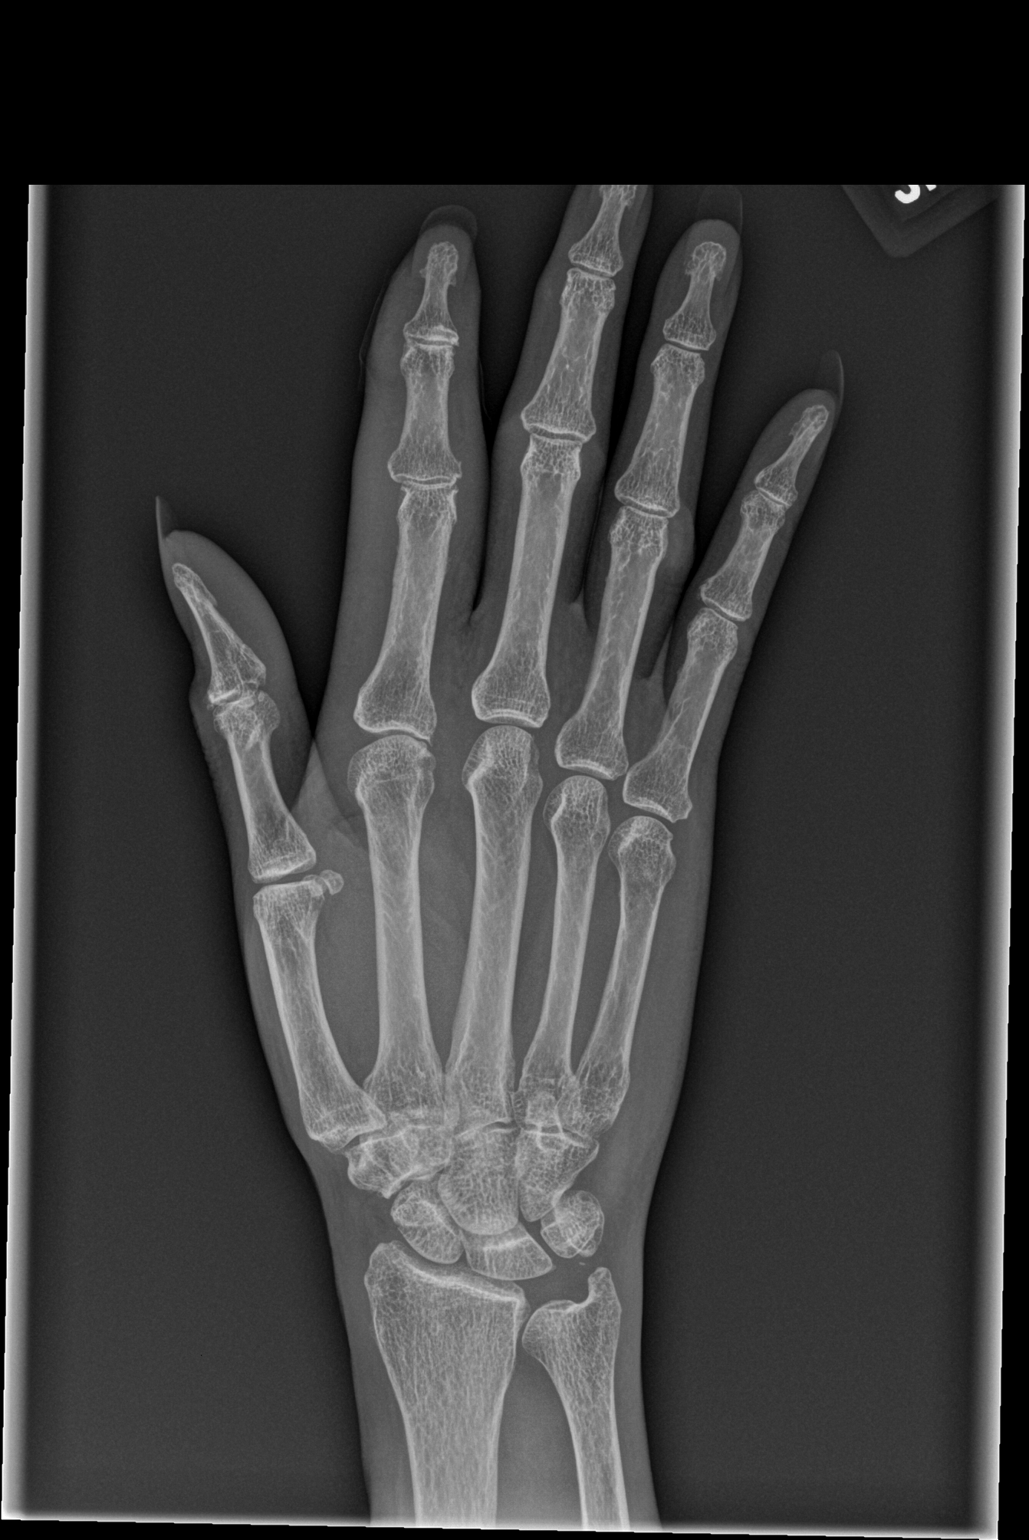

[x hand obl right]
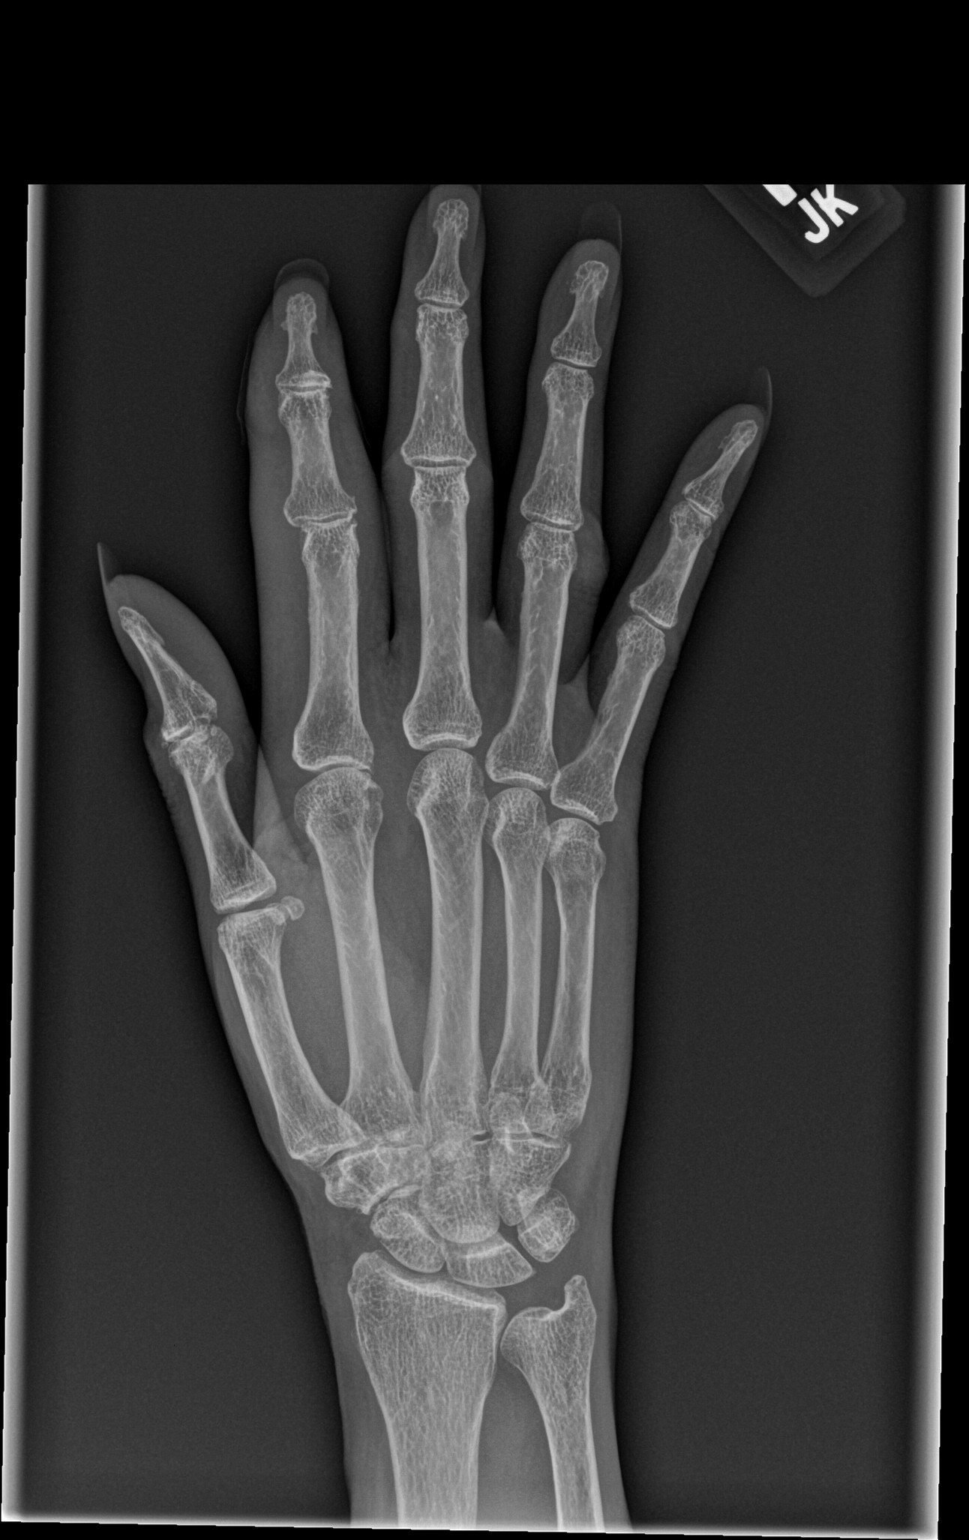

[x hand lat right]
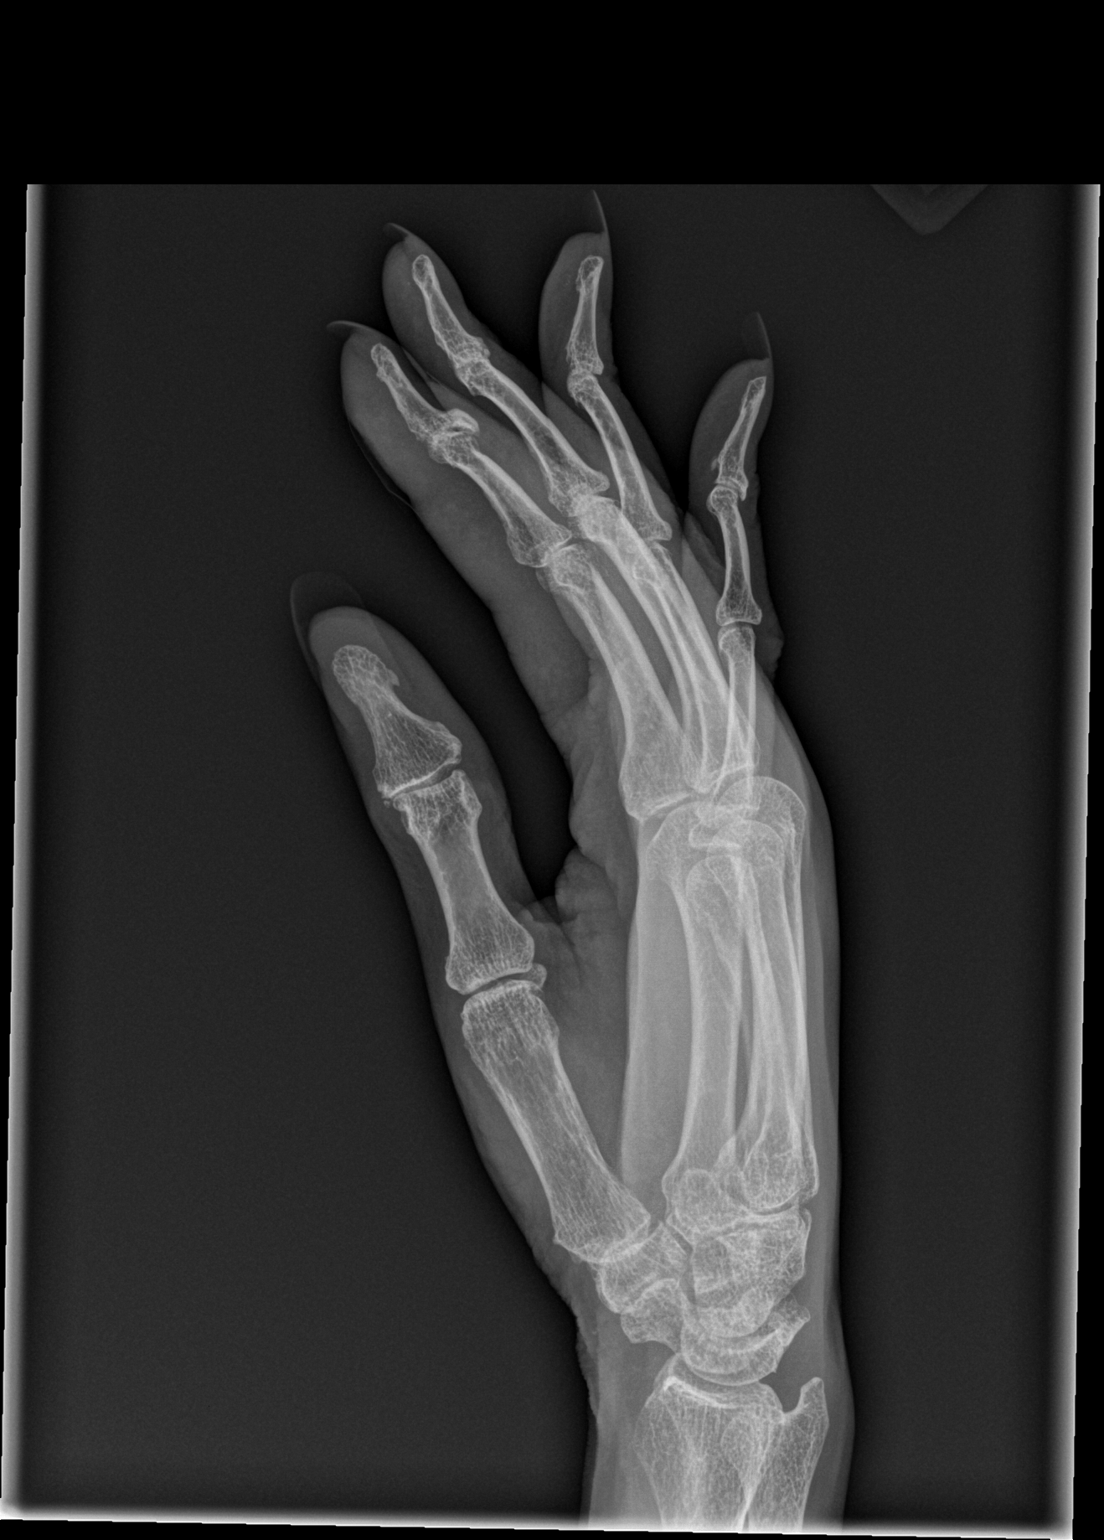

[3 of 3 positions shown; findings below may reference images not displayed]

FINDINGS: No fracture or malalignment. Diffuse soft tissue swelling of the
second and third digits. Possible small erosion at the second IP
joint. No periostitis. Mild degenerative changes at the second PIP
joint.
IMPRESSION: Soft tissue swelling of the second and third digits with possible
small erosion at the second PIP joint.

## 2019-12-14 MED ORDER — COLCHICINE 0.6 MG PO CAPS: ORAL_CAPSULE | ORAL | 1 refills | Status: DC

## 2019-12-14 NOTE — Telephone Encounter (Signed)
Received Drug Change Request from [facility].  Stated that Drug, Colchicine, is NOT covered by patient's plan.  The preferred alternative is MITIGARE. Needs Rx for this.  Please Advise.

## 2019-12-14 NOTE — Telephone Encounter (Signed)
New Rx sent.  Hopefully, she'll get her med today.

## 2019-12-14 NOTE — Progress Notes (Signed)
Kidney function remains about the same consistent with stage IV chronic kidney disease.  Some due to advanced age, likely a component of hypertension and diabetes.  I recommend that with her routine visit (which was not scheduled), we check a set of labs related to her kidneys--PTH, magnesium, phosphorus and ionized calcium and repeat the uric acid to see where it stands then   Her uric acid remains above goal of less than 6.  I have ordered a uric acid lowering treatment for her now.  She will also need a long-term uric acid lowering drug which I can prescribe at her follow-up also (initially it raises uric acid so it should not be started during a flare)

## 2019-12-15 DIAGNOSIS — I890 Lymphedema, not elsewhere classified: Secondary | ICD-10-CM | POA: Diagnosis not present

## 2019-12-15 DIAGNOSIS — M6281 Muscle weakness (generalized): Secondary | ICD-10-CM | POA: Diagnosis not present

## 2019-12-15 DIAGNOSIS — R296 Repeated falls: Secondary | ICD-10-CM | POA: Diagnosis not present

## 2019-12-15 DIAGNOSIS — R262 Difficulty in walking, not elsewhere classified: Secondary | ICD-10-CM | POA: Diagnosis not present

## 2019-12-15 NOTE — Progress Notes (Signed)
Xray of her right hand shows the swelling of her second and third fingers and a possible small erosion of her second PIP which could be gouty or other inflammatory arthritis.  The referral to rheumatology was entered on Monday when I saw her.

## 2019-12-15 NOTE — Progress Notes (Signed)
Thank you.  There are future labs she has never gotten in the system (often appts not scheduled when she leaves):  cbc, cmp, hba1c and urine micro.  Those should also be done at the 3 month point.

## 2019-12-19 DIAGNOSIS — R262 Difficulty in walking, not elsewhere classified: Secondary | ICD-10-CM | POA: Diagnosis not present

## 2019-12-19 DIAGNOSIS — M6281 Muscle weakness (generalized): Secondary | ICD-10-CM | POA: Diagnosis not present

## 2019-12-19 DIAGNOSIS — I890 Lymphedema, not elsewhere classified: Secondary | ICD-10-CM | POA: Diagnosis not present

## 2019-12-19 DIAGNOSIS — R296 Repeated falls: Secondary | ICD-10-CM | POA: Diagnosis not present

## 2019-12-20 DIAGNOSIS — R262 Difficulty in walking, not elsewhere classified: Secondary | ICD-10-CM | POA: Diagnosis not present

## 2019-12-20 DIAGNOSIS — R296 Repeated falls: Secondary | ICD-10-CM | POA: Diagnosis not present

## 2019-12-20 DIAGNOSIS — M6281 Muscle weakness (generalized): Secondary | ICD-10-CM | POA: Diagnosis not present

## 2019-12-20 DIAGNOSIS — I890 Lymphedema, not elsewhere classified: Secondary | ICD-10-CM | POA: Diagnosis not present

## 2019-12-21 DIAGNOSIS — R296 Repeated falls: Secondary | ICD-10-CM | POA: Diagnosis not present

## 2019-12-21 DIAGNOSIS — I890 Lymphedema, not elsewhere classified: Secondary | ICD-10-CM | POA: Diagnosis not present

## 2019-12-21 DIAGNOSIS — M6281 Muscle weakness (generalized): Secondary | ICD-10-CM | POA: Diagnosis not present

## 2019-12-21 DIAGNOSIS — R262 Difficulty in walking, not elsewhere classified: Secondary | ICD-10-CM | POA: Diagnosis not present

## 2019-12-24 DIAGNOSIS — R296 Repeated falls: Secondary | ICD-10-CM | POA: Diagnosis not present

## 2019-12-24 DIAGNOSIS — R262 Difficulty in walking, not elsewhere classified: Secondary | ICD-10-CM | POA: Diagnosis not present

## 2019-12-24 DIAGNOSIS — M6281 Muscle weakness (generalized): Secondary | ICD-10-CM | POA: Diagnosis not present

## 2019-12-24 DIAGNOSIS — I890 Lymphedema, not elsewhere classified: Secondary | ICD-10-CM | POA: Diagnosis not present

## 2019-12-25 DIAGNOSIS — R262 Difficulty in walking, not elsewhere classified: Secondary | ICD-10-CM | POA: Diagnosis not present

## 2019-12-25 DIAGNOSIS — I890 Lymphedema, not elsewhere classified: Secondary | ICD-10-CM | POA: Diagnosis not present

## 2019-12-25 DIAGNOSIS — M6281 Muscle weakness (generalized): Secondary | ICD-10-CM | POA: Diagnosis not present

## 2019-12-25 DIAGNOSIS — R296 Repeated falls: Secondary | ICD-10-CM | POA: Diagnosis not present

## 2019-12-27 DIAGNOSIS — R262 Difficulty in walking, not elsewhere classified: Secondary | ICD-10-CM | POA: Diagnosis not present

## 2019-12-27 DIAGNOSIS — I890 Lymphedema, not elsewhere classified: Secondary | ICD-10-CM | POA: Diagnosis not present

## 2019-12-27 DIAGNOSIS — M6281 Muscle weakness (generalized): Secondary | ICD-10-CM | POA: Diagnosis not present

## 2019-12-27 DIAGNOSIS — R296 Repeated falls: Secondary | ICD-10-CM | POA: Diagnosis not present

## 2019-12-28 ENCOUNTER — Other Ambulatory Visit: Payer: Self-pay | Admitting: Internal Medicine

## 2019-12-28 DIAGNOSIS — I10 Essential (primary) hypertension: Secondary | ICD-10-CM

## 2019-12-28 DIAGNOSIS — R296 Repeated falls: Secondary | ICD-10-CM | POA: Diagnosis not present

## 2019-12-28 DIAGNOSIS — M6281 Muscle weakness (generalized): Secondary | ICD-10-CM | POA: Diagnosis not present

## 2019-12-28 DIAGNOSIS — R600 Localized edema: Secondary | ICD-10-CM

## 2019-12-28 DIAGNOSIS — R262 Difficulty in walking, not elsewhere classified: Secondary | ICD-10-CM | POA: Diagnosis not present

## 2019-12-28 DIAGNOSIS — I890 Lymphedema, not elsewhere classified: Secondary | ICD-10-CM | POA: Diagnosis not present

## 2019-12-29 DIAGNOSIS — M6281 Muscle weakness (generalized): Secondary | ICD-10-CM | POA: Diagnosis not present

## 2019-12-29 DIAGNOSIS — R262 Difficulty in walking, not elsewhere classified: Secondary | ICD-10-CM | POA: Diagnosis not present

## 2019-12-29 DIAGNOSIS — I890 Lymphedema, not elsewhere classified: Secondary | ICD-10-CM | POA: Diagnosis not present

## 2019-12-29 DIAGNOSIS — R296 Repeated falls: Secondary | ICD-10-CM | POA: Diagnosis not present

## 2019-12-30 DIAGNOSIS — I890 Lymphedema, not elsewhere classified: Secondary | ICD-10-CM | POA: Diagnosis not present

## 2019-12-30 DIAGNOSIS — R262 Difficulty in walking, not elsewhere classified: Secondary | ICD-10-CM | POA: Diagnosis not present

## 2019-12-30 DIAGNOSIS — R296 Repeated falls: Secondary | ICD-10-CM | POA: Diagnosis not present

## 2019-12-30 DIAGNOSIS — M6281 Muscle weakness (generalized): Secondary | ICD-10-CM | POA: Diagnosis not present

## 2020-01-03 DIAGNOSIS — R296 Repeated falls: Secondary | ICD-10-CM | POA: Diagnosis not present

## 2020-01-03 DIAGNOSIS — I890 Lymphedema, not elsewhere classified: Secondary | ICD-10-CM | POA: Diagnosis not present

## 2020-01-03 DIAGNOSIS — M6281 Muscle weakness (generalized): Secondary | ICD-10-CM | POA: Diagnosis not present

## 2020-01-03 DIAGNOSIS — R262 Difficulty in walking, not elsewhere classified: Secondary | ICD-10-CM | POA: Diagnosis not present

## 2020-01-04 DIAGNOSIS — R262 Difficulty in walking, not elsewhere classified: Secondary | ICD-10-CM | POA: Diagnosis not present

## 2020-01-04 DIAGNOSIS — I890 Lymphedema, not elsewhere classified: Secondary | ICD-10-CM | POA: Diagnosis not present

## 2020-01-04 DIAGNOSIS — M6281 Muscle weakness (generalized): Secondary | ICD-10-CM | POA: Diagnosis not present

## 2020-01-04 DIAGNOSIS — R296 Repeated falls: Secondary | ICD-10-CM | POA: Diagnosis not present

## 2020-01-06 DIAGNOSIS — I890 Lymphedema, not elsewhere classified: Secondary | ICD-10-CM | POA: Diagnosis not present

## 2020-01-06 DIAGNOSIS — R296 Repeated falls: Secondary | ICD-10-CM | POA: Diagnosis not present

## 2020-01-06 DIAGNOSIS — M6281 Muscle weakness (generalized): Secondary | ICD-10-CM | POA: Diagnosis not present

## 2020-01-06 DIAGNOSIS — R262 Difficulty in walking, not elsewhere classified: Secondary | ICD-10-CM | POA: Diagnosis not present

## 2020-01-07 DIAGNOSIS — I890 Lymphedema, not elsewhere classified: Secondary | ICD-10-CM | POA: Diagnosis not present

## 2020-01-07 DIAGNOSIS — M6281 Muscle weakness (generalized): Secondary | ICD-10-CM | POA: Diagnosis not present

## 2020-01-07 DIAGNOSIS — R296 Repeated falls: Secondary | ICD-10-CM | POA: Diagnosis not present

## 2020-01-07 DIAGNOSIS — R262 Difficulty in walking, not elsewhere classified: Secondary | ICD-10-CM | POA: Diagnosis not present

## 2020-01-11 DIAGNOSIS — I890 Lymphedema, not elsewhere classified: Secondary | ICD-10-CM | POA: Diagnosis not present

## 2020-01-11 DIAGNOSIS — R296 Repeated falls: Secondary | ICD-10-CM | POA: Diagnosis not present

## 2020-01-11 DIAGNOSIS — R262 Difficulty in walking, not elsewhere classified: Secondary | ICD-10-CM | POA: Diagnosis not present

## 2020-01-11 DIAGNOSIS — M6281 Muscle weakness (generalized): Secondary | ICD-10-CM | POA: Diagnosis not present

## 2020-01-12 DIAGNOSIS — I890 Lymphedema, not elsewhere classified: Secondary | ICD-10-CM | POA: Diagnosis not present

## 2020-01-12 DIAGNOSIS — M6281 Muscle weakness (generalized): Secondary | ICD-10-CM | POA: Diagnosis not present

## 2020-01-12 DIAGNOSIS — R262 Difficulty in walking, not elsewhere classified: Secondary | ICD-10-CM | POA: Diagnosis not present

## 2020-01-12 DIAGNOSIS — R296 Repeated falls: Secondary | ICD-10-CM | POA: Diagnosis not present

## 2020-01-13 DIAGNOSIS — M6281 Muscle weakness (generalized): Secondary | ICD-10-CM | POA: Diagnosis not present

## 2020-01-13 DIAGNOSIS — R262 Difficulty in walking, not elsewhere classified: Secondary | ICD-10-CM | POA: Diagnosis not present

## 2020-01-13 DIAGNOSIS — I890 Lymphedema, not elsewhere classified: Secondary | ICD-10-CM | POA: Diagnosis not present

## 2020-01-13 DIAGNOSIS — R296 Repeated falls: Secondary | ICD-10-CM | POA: Diagnosis not present

## 2020-01-17 DIAGNOSIS — R296 Repeated falls: Secondary | ICD-10-CM | POA: Diagnosis not present

## 2020-01-17 DIAGNOSIS — I890 Lymphedema, not elsewhere classified: Secondary | ICD-10-CM | POA: Diagnosis not present

## 2020-01-17 DIAGNOSIS — R262 Difficulty in walking, not elsewhere classified: Secondary | ICD-10-CM | POA: Diagnosis not present

## 2020-01-17 DIAGNOSIS — M6281 Muscle weakness (generalized): Secondary | ICD-10-CM | POA: Diagnosis not present

## 2020-01-18 DIAGNOSIS — I890 Lymphedema, not elsewhere classified: Secondary | ICD-10-CM | POA: Diagnosis not present

## 2020-01-18 DIAGNOSIS — M6281 Muscle weakness (generalized): Secondary | ICD-10-CM | POA: Diagnosis not present

## 2020-01-18 DIAGNOSIS — R296 Repeated falls: Secondary | ICD-10-CM | POA: Diagnosis not present

## 2020-01-18 DIAGNOSIS — R262 Difficulty in walking, not elsewhere classified: Secondary | ICD-10-CM | POA: Diagnosis not present

## 2020-01-19 DIAGNOSIS — M6281 Muscle weakness (generalized): Secondary | ICD-10-CM | POA: Diagnosis not present

## 2020-01-19 DIAGNOSIS — I890 Lymphedema, not elsewhere classified: Secondary | ICD-10-CM | POA: Diagnosis not present

## 2020-01-19 DIAGNOSIS — R262 Difficulty in walking, not elsewhere classified: Secondary | ICD-10-CM | POA: Diagnosis not present

## 2020-01-19 DIAGNOSIS — R296 Repeated falls: Secondary | ICD-10-CM | POA: Diagnosis not present

## 2020-01-21 DIAGNOSIS — R262 Difficulty in walking, not elsewhere classified: Secondary | ICD-10-CM | POA: Diagnosis not present

## 2020-01-21 DIAGNOSIS — I890 Lymphedema, not elsewhere classified: Secondary | ICD-10-CM | POA: Diagnosis not present

## 2020-01-21 DIAGNOSIS — R296 Repeated falls: Secondary | ICD-10-CM | POA: Diagnosis not present

## 2020-01-21 DIAGNOSIS — M6281 Muscle weakness (generalized): Secondary | ICD-10-CM | POA: Diagnosis not present

## 2020-02-01 DIAGNOSIS — M6281 Muscle weakness (generalized): Secondary | ICD-10-CM | POA: Diagnosis not present

## 2020-02-01 DIAGNOSIS — R262 Difficulty in walking, not elsewhere classified: Secondary | ICD-10-CM | POA: Diagnosis not present

## 2020-02-01 DIAGNOSIS — I890 Lymphedema, not elsewhere classified: Secondary | ICD-10-CM | POA: Diagnosis not present

## 2020-02-01 DIAGNOSIS — R296 Repeated falls: Secondary | ICD-10-CM | POA: Diagnosis not present

## 2020-02-02 DIAGNOSIS — I890 Lymphedema, not elsewhere classified: Secondary | ICD-10-CM | POA: Diagnosis not present

## 2020-02-02 DIAGNOSIS — G8929 Other chronic pain: Secondary | ICD-10-CM | POA: Diagnosis not present

## 2020-02-02 DIAGNOSIS — S0083XA Contusion of other part of head, initial encounter: Secondary | ICD-10-CM | POA: Diagnosis not present

## 2020-02-02 DIAGNOSIS — R296 Repeated falls: Secondary | ICD-10-CM | POA: Diagnosis not present

## 2020-02-02 DIAGNOSIS — W19XXXA Unspecified fall, initial encounter: Secondary | ICD-10-CM | POA: Diagnosis not present

## 2020-02-02 DIAGNOSIS — R262 Difficulty in walking, not elsewhere classified: Secondary | ICD-10-CM | POA: Diagnosis not present

## 2020-02-02 DIAGNOSIS — S01511A Laceration without foreign body of lip, initial encounter: Secondary | ICD-10-CM | POA: Diagnosis not present

## 2020-02-02 DIAGNOSIS — M6281 Muscle weakness (generalized): Secondary | ICD-10-CM | POA: Diagnosis not present

## 2020-02-02 DIAGNOSIS — M542 Cervicalgia: Secondary | ICD-10-CM | POA: Diagnosis not present

## 2020-02-03 DIAGNOSIS — R296 Repeated falls: Secondary | ICD-10-CM | POA: Diagnosis not present

## 2020-02-03 DIAGNOSIS — R262 Difficulty in walking, not elsewhere classified: Secondary | ICD-10-CM | POA: Diagnosis not present

## 2020-02-03 DIAGNOSIS — I890 Lymphedema, not elsewhere classified: Secondary | ICD-10-CM | POA: Diagnosis not present

## 2020-02-03 DIAGNOSIS — H1131 Conjunctival hemorrhage, right eye: Secondary | ICD-10-CM | POA: Diagnosis not present

## 2020-02-03 DIAGNOSIS — M6281 Muscle weakness (generalized): Secondary | ICD-10-CM | POA: Diagnosis not present

## 2020-02-03 DIAGNOSIS — S0083XA Contusion of other part of head, initial encounter: Secondary | ICD-10-CM | POA: Diagnosis not present

## 2020-02-03 DIAGNOSIS — Z7189 Other specified counseling: Secondary | ICD-10-CM | POA: Diagnosis not present

## 2020-02-04 DIAGNOSIS — M6281 Muscle weakness (generalized): Secondary | ICD-10-CM | POA: Diagnosis not present

## 2020-02-04 DIAGNOSIS — I890 Lymphedema, not elsewhere classified: Secondary | ICD-10-CM | POA: Diagnosis not present

## 2020-02-04 DIAGNOSIS — R296 Repeated falls: Secondary | ICD-10-CM | POA: Diagnosis not present

## 2020-02-04 DIAGNOSIS — R262 Difficulty in walking, not elsewhere classified: Secondary | ICD-10-CM | POA: Diagnosis not present

## 2020-02-07 DIAGNOSIS — R296 Repeated falls: Secondary | ICD-10-CM | POA: Diagnosis not present

## 2020-02-07 DIAGNOSIS — R262 Difficulty in walking, not elsewhere classified: Secondary | ICD-10-CM | POA: Diagnosis not present

## 2020-02-07 DIAGNOSIS — M6281 Muscle weakness (generalized): Secondary | ICD-10-CM | POA: Diagnosis not present

## 2020-02-07 DIAGNOSIS — I890 Lymphedema, not elsewhere classified: Secondary | ICD-10-CM | POA: Diagnosis not present

## 2020-02-08 DIAGNOSIS — I890 Lymphedema, not elsewhere classified: Secondary | ICD-10-CM | POA: Diagnosis not present

## 2020-02-08 DIAGNOSIS — R262 Difficulty in walking, not elsewhere classified: Secondary | ICD-10-CM | POA: Diagnosis not present

## 2020-02-08 DIAGNOSIS — M6281 Muscle weakness (generalized): Secondary | ICD-10-CM | POA: Diagnosis not present

## 2020-02-08 DIAGNOSIS — R296 Repeated falls: Secondary | ICD-10-CM | POA: Diagnosis not present

## 2020-02-09 DIAGNOSIS — H20041 Secondary noninfectious iridocyclitis, right eye: Secondary | ICD-10-CM | POA: Diagnosis not present

## 2020-02-09 DIAGNOSIS — H18231 Secondary corneal edema, right eye: Secondary | ICD-10-CM | POA: Diagnosis not present

## 2020-02-10 DIAGNOSIS — R262 Difficulty in walking, not elsewhere classified: Secondary | ICD-10-CM | POA: Diagnosis not present

## 2020-02-10 DIAGNOSIS — R296 Repeated falls: Secondary | ICD-10-CM | POA: Diagnosis not present

## 2020-02-10 DIAGNOSIS — I890 Lymphedema, not elsewhere classified: Secondary | ICD-10-CM | POA: Diagnosis not present

## 2020-02-10 DIAGNOSIS — M6281 Muscle weakness (generalized): Secondary | ICD-10-CM | POA: Diagnosis not present

## 2020-02-14 DIAGNOSIS — R296 Repeated falls: Secondary | ICD-10-CM | POA: Diagnosis not present

## 2020-02-14 DIAGNOSIS — R262 Difficulty in walking, not elsewhere classified: Secondary | ICD-10-CM | POA: Diagnosis not present

## 2020-02-14 DIAGNOSIS — M6281 Muscle weakness (generalized): Secondary | ICD-10-CM | POA: Diagnosis not present

## 2020-02-14 DIAGNOSIS — I890 Lymphedema, not elsewhere classified: Secondary | ICD-10-CM | POA: Diagnosis not present

## 2020-02-16 DIAGNOSIS — M6281 Muscle weakness (generalized): Secondary | ICD-10-CM | POA: Diagnosis not present

## 2020-02-16 DIAGNOSIS — R262 Difficulty in walking, not elsewhere classified: Secondary | ICD-10-CM | POA: Diagnosis not present

## 2020-02-16 DIAGNOSIS — I890 Lymphedema, not elsewhere classified: Secondary | ICD-10-CM | POA: Diagnosis not present

## 2020-02-16 DIAGNOSIS — R296 Repeated falls: Secondary | ICD-10-CM | POA: Diagnosis not present

## 2020-02-17 DIAGNOSIS — R296 Repeated falls: Secondary | ICD-10-CM | POA: Diagnosis not present

## 2020-02-17 DIAGNOSIS — M6281 Muscle weakness (generalized): Secondary | ICD-10-CM | POA: Diagnosis not present

## 2020-02-17 DIAGNOSIS — R262 Difficulty in walking, not elsewhere classified: Secondary | ICD-10-CM | POA: Diagnosis not present

## 2020-02-17 DIAGNOSIS — I890 Lymphedema, not elsewhere classified: Secondary | ICD-10-CM | POA: Diagnosis not present

## 2020-02-21 DIAGNOSIS — R262 Difficulty in walking, not elsewhere classified: Secondary | ICD-10-CM | POA: Diagnosis not present

## 2020-02-21 DIAGNOSIS — R296 Repeated falls: Secondary | ICD-10-CM | POA: Diagnosis not present

## 2020-02-21 DIAGNOSIS — I890 Lymphedema, not elsewhere classified: Secondary | ICD-10-CM | POA: Diagnosis not present

## 2020-02-21 DIAGNOSIS — M6281 Muscle weakness (generalized): Secondary | ICD-10-CM | POA: Diagnosis not present

## 2020-02-24 DIAGNOSIS — R296 Repeated falls: Secondary | ICD-10-CM | POA: Diagnosis not present

## 2020-02-24 DIAGNOSIS — M6281 Muscle weakness (generalized): Secondary | ICD-10-CM | POA: Diagnosis not present

## 2020-02-24 DIAGNOSIS — R262 Difficulty in walking, not elsewhere classified: Secondary | ICD-10-CM | POA: Diagnosis not present

## 2020-02-24 DIAGNOSIS — I890 Lymphedema, not elsewhere classified: Secondary | ICD-10-CM | POA: Diagnosis not present

## 2020-03-07 ENCOUNTER — Other Ambulatory Visit: Payer: Self-pay | Admitting: Internal Medicine

## 2020-03-07 DIAGNOSIS — M10341 Gout due to renal impairment, right hand: Secondary | ICD-10-CM

## 2020-03-08 ENCOUNTER — Ambulatory Visit: Payer: Medicare PPO | Admitting: Podiatry

## 2020-03-08 ENCOUNTER — Other Ambulatory Visit: Payer: Self-pay

## 2020-03-08 ENCOUNTER — Encounter: Payer: Self-pay | Admitting: Podiatry

## 2020-03-08 DIAGNOSIS — M79676 Pain in unspecified toe(s): Secondary | ICD-10-CM | POA: Diagnosis not present

## 2020-03-08 DIAGNOSIS — B351 Tinea unguium: Secondary | ICD-10-CM

## 2020-03-08 DIAGNOSIS — E1142 Type 2 diabetes mellitus with diabetic polyneuropathy: Secondary | ICD-10-CM

## 2020-03-08 DIAGNOSIS — L84 Corns and callosities: Secondary | ICD-10-CM

## 2020-03-08 NOTE — Telephone Encounter (Signed)
Patient is requesting refill on medication "Mitigare".Medication last sent on 12/14/2019 with 30 capsules. Patient is odered to take 1 capsule with 1 refill.  I'm not sure if this medication is long term or if patient needs to schedule appointment to address concerns. In order to receive a refill. Medication pend and sent to provider Mariea Clonts, Tiffany L, DO . Please Advise.

## 2020-03-08 NOTE — Telephone Encounter (Signed)
It looks like this was supposed to be until she saw rheumatology and then they would determine if she'd continue on it for gout.  Usually it's given for 3-6 months to lower uric acid and prevent a flare from starting allopurinol which temporarily increases the free uric acid in the blood and lead to flares.  It's also given just for flare-ups for a few weeks.

## 2020-03-12 NOTE — Progress Notes (Signed)
Subjective: Mia [name] presents today for follow up of at risk foot care. Pt has h/o NIDDM with chronic kidney disease and painful corn(s) right 4th digit, left 5th digit , callus(es) plantar right foot and 1st metatarsal right foot and painful mycotic nails.  Pain interferes with ambulation. Aggravating factors include wearing enclosed shoe gear. Painful toenails interfere with ambulation. Aggravating factors include wearing enclosed shoe gear. Pain is relieved with periodic professional debridement. Painful corns and calluses are aggravated when weightbearing with and without shoegear. Pain is relieved with periodic professional debridement.   She also has h/o gout. She voices no new pedal problems on today's visit.  No Known Allergies   Objective: There were no vitals filed for this visit.  Pt is a pleasant 86 y.o. AAF, WD, WN in NAD. AAO x 3.  Vascular Examination:  Capillary refill time to digits immediate b/l. Palpable DP pulses b/l. Palpable PT pulses b/l. Pedal hair sparse b/l. Skin temperature gradient within normal limits b/l. Unilateral edema noted LLE. +1-2. No pain with calf compression b/l.  Dermatological Examination: Pedal skin with normal turgor, texture and tone bilaterally. No open wounds bilaterally. No interdigital macerations bilaterally. Toenails 1-5 b/l elongated, discolored, dystrophic, thickened, crumbly with subungual debris and tenderness to dorsal palpation. Hyperkeratotic lesion(s) R 5th toe, submet head 3 right foot and submet head 4 right foot.  No erythema, no edema, no drainage, no flocculence.   Musculoskeletal: Normal muscle strength 5/5 to all lower extremity muscle groups bilaterally, no pain crepitus or joint limitation noted with ROM b/l, bunion deformity noted b/l and pain on palpation dorsal aspect of metatarsals 2 and 3 left foot  Neurological: Protective sensation diminished with 10g monofilament b/l.  Assessment: 1. Pain due to onychomycosis of  toenail   2. Corns and callosities   3. Diabetic peripheral neuropathy associated with type 2 diabetes mellitus (HCC)    Plan: -Continue diabetic foot care principles daily. -Toenails 1-5 b/l were debrided in length and girth with sterile nail nippers and dremel without iatrogenic bleeding.  -Corn(s) debrided right 5th toe without complication or incident. Total number debrided=1. -Calluses submet head 3, 4 right were debrided without complication or incident. Total number debrided =2. -Patient/POA to call should there be question/concern in the interim.  Return in about 3 months (around 06/08/2020).

## 2020-03-13 DIAGNOSIS — H20041 Secondary noninfectious iridocyclitis, right eye: Secondary | ICD-10-CM | POA: Diagnosis not present

## 2020-03-13 DIAGNOSIS — H35351 Cystoid macular degeneration, right eye: Secondary | ICD-10-CM | POA: Diagnosis not present

## 2020-03-13 DIAGNOSIS — H353131 Nonexudative age-related macular degeneration, bilateral, early dry stage: Secondary | ICD-10-CM | POA: Diagnosis not present

## 2020-03-13 DIAGNOSIS — H18231 Secondary corneal edema, right eye: Secondary | ICD-10-CM | POA: Diagnosis not present

## 2020-03-14 ENCOUNTER — Other Ambulatory Visit: Payer: Medicare PPO

## 2020-03-14 ENCOUNTER — Other Ambulatory Visit: Payer: Self-pay

## 2020-03-14 DIAGNOSIS — R739 Hyperglycemia, unspecified: Secondary | ICD-10-CM

## 2020-03-14 DIAGNOSIS — N184 Chronic kidney disease, stage 4 (severe): Secondary | ICD-10-CM | POA: Diagnosis not present

## 2020-03-14 DIAGNOSIS — M10341 Gout due to renal impairment, right hand: Secondary | ICD-10-CM

## 2020-03-15 ENCOUNTER — Other Ambulatory Visit: Payer: Self-pay | Admitting: Internal Medicine

## 2020-03-15 LAB — CBC WITH DIFFERENTIAL/PLATELET
Absolute Monocytes: 461 cells/uL (ref 200–950)
Basophils Absolute: 19 cells/uL (ref 0–200)
Basophils Relative: 0.4 %
Eosinophils Absolute: 99 cells/uL (ref 15–500)
Eosinophils Relative: 2.1 %
HCT: 44.2 % (ref 35.0–45.0)
Hemoglobin: 14.6 g/dL (ref 11.7–15.5)
Lymphs Abs: 921 cells/uL (ref 850–3900)
MCH: 30.3 pg (ref 27.0–33.0)
MCHC: 33 g/dL (ref 32.0–36.0)
MCV: 91.7 fL (ref 80.0–100.0)
MPV: 11.3 fL (ref 7.5–12.5)
Monocytes Relative: 9.8 %
Neutro Abs: 3201 cells/uL (ref 1500–7800)
Neutrophils Relative %: 68.1 %
Platelets: 209 10*3/uL (ref 140–400)
RBC: 4.82 10*6/uL (ref 3.80–5.10)
RDW: 14.3 % (ref 11.0–15.0)
Total Lymphocyte: 19.6 %
WBC: 4.7 10*3/uL (ref 3.8–10.8)

## 2020-03-15 LAB — COMPLETE METABOLIC PANEL WITH GFR
AG Ratio: 1.6 (calc) (ref 1.0–2.5)
ALT: 12 U/L (ref 6–29)
AST: 20 U/L (ref 10–35)
Albumin: 4.2 g/dL (ref 3.6–5.1)
Alkaline phosphatase (APISO): 65 U/L (ref 37–153)
BUN/Creatinine Ratio: 17 (calc) (ref 6–22)
BUN: 29 mg/dL — ABNORMAL HIGH (ref 7–25)
CO2: 27 mmol/L (ref 20–32)
Calcium: 10.1 mg/dL (ref 8.6–10.4)
Chloride: 106 mmol/L (ref 98–110)
Creat: 1.67 mg/dL — ABNORMAL HIGH (ref 0.60–0.88)
GFR, Est African American: 30 mL/min/{1.73_m2} — ABNORMAL LOW (ref >=60)
GFR, Est Non African American: 26 mL/min/{1.73_m2} — ABNORMAL LOW (ref >=60)
Globulin: 2.7 g/dL (calc) (ref 1.9–3.7)
Glucose, Bld: 105 mg/dL — ABNORMAL HIGH (ref 65–99)
Potassium: 3.8 mmol/L (ref 3.5–5.3)
Sodium: 144 mmol/L (ref 135–146)
Total Bilirubin: 0.7 mg/dL (ref 0.2–1.2)
Total Protein: 6.9 g/dL (ref 6.1–8.1)

## 2020-03-15 LAB — PTH, INTACT AND CALCIUM
Calcium: 10.1 mg/dL (ref 8.6–10.4)
PTH: 72 pg/mL — ABNORMAL HIGH (ref 14–64)

## 2020-03-15 LAB — URIC ACID: Uric Acid, Serum: 7.7 mg/dL — ABNORMAL HIGH (ref 2.5–7.0)

## 2020-03-15 LAB — MICROALBUMIN / CREATININE URINE RATIO
Creatinine, Urine: 115 mg/dL (ref 20–275)
Microalb Creat Ratio: 588 mcg/mg creat — ABNORMAL HIGH (ref <30)
Microalb, Ur: 67.6 mg/dL

## 2020-03-15 LAB — MAGNESIUM: Magnesium: 1.9 mg/dL (ref 1.5–2.5)

## 2020-03-15 LAB — CALCIUM, IONIZED: Calcium, Ion: 5.4 mg/dL (ref 4.8–5.6)

## 2020-03-15 LAB — PHOSPHORUS: Phosphorus: 3.5 mg/dL (ref 2.1–4.3)

## 2020-03-15 LAB — HEMOGLOBIN A1C
Hgb A1c MFr Bld: 6.1 % of total Hgb — ABNORMAL HIGH (ref <5.7)
Mean Plasma Glucose: 128 (calc)
eAG (mmol/L): 7.1 (calc)

## 2020-03-15 NOTE — Progress Notes (Signed)
Uric acid remains just as high.  She needs allopurinol regularly from what I can tell. She was referred to rheumatology before but I cannot see the actual note of what happened.  We'll talk about all of this at her appt tomorrow.  Her blood counts, magnesium, phosphorus are normal.  Sugar average is stable at 6.1.

## 2020-03-15 NOTE — Progress Notes (Signed)
Mildly high parathyroid hormone from chronic kidney disease but normal calcium levels so does not warrant intervention.

## 2020-03-16 ENCOUNTER — Encounter: Payer: Self-pay | Admitting: Internal Medicine

## 2020-03-16 ENCOUNTER — Other Ambulatory Visit: Payer: Self-pay

## 2020-03-16 ENCOUNTER — Ambulatory Visit (INDEPENDENT_AMBULATORY_CARE_PROVIDER_SITE_OTHER): Payer: Medicare PPO | Admitting: Internal Medicine

## 2020-03-16 VITALS — BP 130/98 | HR 85 | Temp 97.5°F | Resp 20 | Ht 67.0 in | Wt 126.8 lb

## 2020-03-16 DIAGNOSIS — Z7189 Other specified counseling: Secondary | ICD-10-CM

## 2020-03-16 DIAGNOSIS — N184 Chronic kidney disease, stage 4 (severe): Secondary | ICD-10-CM | POA: Diagnosis not present

## 2020-03-16 DIAGNOSIS — R42 Dizziness and giddiness: Secondary | ICD-10-CM

## 2020-03-16 DIAGNOSIS — M1A3711 Chronic gout due to renal impairment, right ankle and foot, with tophus (tophi): Secondary | ICD-10-CM

## 2020-03-16 DIAGNOSIS — G3184 Mild cognitive impairment, so stated: Secondary | ICD-10-CM

## 2020-03-16 MED ORDER — ALLOPURINOL 100 MG PO TABS: 100.0000 mg | ORAL_TABLET | Freq: Every day | ORAL | 6 refills | Status: DC

## 2020-03-16 NOTE — Progress Notes (Signed)
Location:  [facility] clinic Provider:  Audra Bellard L. Mariea Clonts, D.O., C.M.D.  Code Status: DNR Goals of Care:  Advanced Directives 12/13/2019  Does Patient Have a Medical Advance Directive? Yes  Type of Advance Directive Out of facility DNR (pink MOST or yellow form)  Does patient want to make changes to medical advance directive? No - Patient declined  Copy of [place] in Chart? Yes - validated most recent copy scanned in chart (See row information)  Pre-existing out of facility DNR order (yellow form or pink MOST form) -     Chief Complaint  Patient presents with  . Medical Management of Chronic Issues    3 Month follow up please give lab results patient is taking new medication from [facility]. will bring in for next visit or call in    HPI: Patient is a 86 y.o. female seen today for medical management of chronic diseases.  She reported she has been lightheaded recently.  It's more profound when she stands suddenly.  Also if she's on her feet for a long time.  It's relatively new.  BP here was good, pulse normal.    She fell about a month ago.  An automatic door hit her and she hit her head.   She had a big swollen area on her eye and is still seeing ophtho.  She has an additional drop for the inflammation now.    She thinks she is getting more confused, too.  She is "crazy" b/c she has not eaten due to getting picked up.    She had a spell of shortness of breath the other day, also.  She was not doing anything strenuous.  She also had rapid heartbeats at that same time.  She had no pain.  Advised to notify me if it recurs.    She says she feels like she won't be around much longer. Says she's ok with that.  Keeps getting new symptoms.    She is not eating well b/c she feels like the food has a lot of salt.  She's no longer doing any cooking at [employer].  She has lunch and dinner there.  She eats healthy items in her apt like oatmeal, fruit for breakfast.  Sometimes she is  not getting up until after lunch.  She stays up late like 11:30pm.  That's been her long-term habit.  She reads then.  Chooses the lower sodium options.  Otherwise, she orders things out and they're notoriously salty.  She does have a good appetite and eats majority of meals.    She's saying she's very conservative.  She plans to donate her body to [facility] when she passes away.  She will then be cremated.  She has a list she carries with her.    Past Medical History:  Diagnosis Date  . Arthritis    oa  . Chronic kidney disease    chronic kidney diseaase follow by primary md  . Diabetes mellitus without complication ([place])    Diet controlled  . Former smoker   . H/O ventricular fibrillation 10 yrs ago dx  . Hypertension   . Prediabetes   . Seasonal allergies   . TIA (transient ischemic attack) none recent    Past Surgical History:  Procedure Laterality Date  . NM MYOCAR PERF WALL MOTION  01/2008   lexiscan myoview - normal pattern of perfusion in all regions, EF 92%, no significant ischemia demonstated  . TONSILLECTOMY  age 69  and adenoids  . TOTAL KNEE ARTHROPLASTY Right 10/06/2015   Procedure: RIGHT TOTAL KNEE ARTHROPLASTY;  Surgeon: [name], MD;  Location: [facility];  Service: Orthopedics;  Laterality: Right;  . TRANSTHORACIC ECHOCARDIOGRAM  12/2005   mild DUST, EF=>55%; mild mitral annular calcif; mild-mod aortic root calcif    No Known Allergies  Outpatient Encounter Medications as of 03/16/2020  Medication Sig  . cholecalciferol (VITAMIN D) 1000 UNITS tablet Take 1,000 Units by mouth daily. D3  . clopidogrel (PLAVIX) 75 MG tablet TAKE 1 TABLET(75 MG) BY MOUTH DAILY  . Colchicine (MITIGARE) 0.6 MG CAPS 2 capsules when received, then one cap po daily pending rheum appt  . hydrochlorothiazide (HYDRODIURIL) 12.5 MG tablet Take 1 tablet by mouth every other day.  . metoprolol succinate (TOPROL-XL) 25 MG 24 hr tablet Take 1 tablet by mouth daily.  . potassium  chloride (KLOR-CON) 10 MEQ tablet TAKE 1 TABLET(10 MEQ) BY MOUTH DAILY  . ketorolac (ACULAR) 0.5 % ophthalmic solution Place 1 drop into the right eye 2 (two) times daily. (Patient not taking: Reported on 03/16/2020)  . prednisoLONE acetate (PRED FORTE) 1 % ophthalmic suspension Place into the right eye. (Patient not taking: Reported on 03/16/2020)   No facility-administered encounter medications on file as of 03/16/2020.    Review of Systems:  Review of Systems  Constitutional: Negative for chills, fever and malaise/fatigue.  HENT: Negative for congestion and sore throat.   Eyes: Negative for blurred vision.  Respiratory: Positive for shortness of breath. Negative for cough.   Cardiovascular: Positive for palpitations and leg swelling. Negative for chest pain, orthopnea and PND.  Gastrointestinal: Negative for abdominal pain and constipation.  Genitourinary: Negative for dysuria.  Musculoskeletal: Positive for falls. Negative for joint pain.  Skin: Negative for itching and rash.  Neurological: Negative for dizziness and loss of consciousness.       Uses rollator walker  Psychiatric/Behavioral: Positive for memory loss. Negative for depression. The patient is not nervous/anxious and does not have insomnia.     Health Maintenance  Topic Date Due  . INFLUENZA VACCINE  12/19/2019  . DEXA SCAN  10/03/2020 (Originally 09/25/1992)  . URINE MICROALBUMIN  03/14/2021  . TETANUS/TDAP  07/08/2025  . COVID-19 Vaccine  Completed  . PNA vac Low Risk Adult  Completed    Physical Exam: Vitals:   03/16/20 1300  Resp: 20  Temp: (!) 97.5 F (36.4 C)  TempSrc: Temporal  Weight: 126 lb 12.8 oz (57.5 kg)  Height: 5\' 7"  (1.702 m)   Body mass index is 19.86 kg/m. Physical Exam Vitals reviewed.  Constitutional:      General: She is not in acute distress.    Appearance: Normal appearance. She is not ill-appearing or toxic-appearing.     Comments: Increasingly frail  HENT:     Head:  Normocephalic and atraumatic.  Eyes:     Comments: Glasses, still has some swelling near right eye  Cardiovascular:     Rate and Rhythm: Normal rate and regular rhythm.     Pulses: Normal pulses.     Heart sounds: Normal heart sounds.  Pulmonary:     Effort: Pulmonary effort is normal.     Breath sounds: Normal breath sounds. No wheezing, rhonchi or rales.  Abdominal:     General: Bowel sounds are normal.     Palpations: Abdomen is soft.     Tenderness: There is no abdominal tenderness.  Musculoskeletal:     Right lower leg: Edema present.  Left lower leg: Edema present.  Skin:    Capillary Refill: Capillary refill takes less than 2 seconds.  Neurological:     General: No focal deficit present.     Mental Status: She is alert and oriented to person, place, and time.     Comments: But having more difficulty recalling things, providing history  Psychiatric:        Mood and Affect: Mood normal.        Behavior: Behavior normal.     Labs reviewed: Basic Metabolic Panel: Recent Labs    12/13/19 1416 03/14/20 0942  NA 140 144  K 4.1 3.8  CL 107 106  CO2 24 27  GLUCOSE 97 105*  BUN 26* 29*  CREATININE 1.66* 1.67*  CALCIUM 9.5 10.1  10.1  MG  --  1.9  PHOS  --  3.5   Liver Function Tests: Recent Labs    03/14/20 0942  AST 20  ALT 12  BILITOT 0.7  PROT 6.9   No results for input(s): LIPASE, AMYLASE in the last 8760 hours. No results for input(s): AMMONIA in the last 8760 hours. CBC: Recent Labs    04/05/19 1352 11/16/19 1130 03/14/20 0942  WBC 5.3 6.9 4.7  NEUTROABS 4,033 5,375 3,201  HGB 13.5 14.7 14.6  HCT 40.6 44.8 44.2  MCV 92.5 90.9 91.7  PLT 211 272 209   Lipid Panel: No results for input(s): CHOL, HDL, LDLCALC, TRIG, CHOLHDL, LDLDIRECT in the last 8760 hours. Lab Results  Component Value Date   HGBA1C 6.1 (H) 03/14/2020    Procedures since last visit: No results found.  Assessment/Plan 1. 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  Dispense: 30 tablet; Refill: 6  2. CKD (chronic kidney disease) stage 4, GFR 15-29 ml/min (HCC) -Avoid nephrotoxic agents like nsaids, dose adjust renally excreted meds, hydrate.  3. Mild cognitive impairment with memory loss -progressing, need to reassess MMSE next time -is needing more assistance with managing her home due to frailty also and I'm concerned about folks coming in and taking advantage of her  4. Postural lightheadedness -reporting some of this though not active symptom today -encouraged adequate hydration and slow positional changes -also does not have anyone who could help with compression hose which she'd benefit from wearing   5. ACP (advance care planning) -18 mins spent on ACP -reviewed some of her end of life plans today as in hpi b/c she just has a feeling about her health and feels like she has a new problem every day -recommended that we complete her MOST form--she already has a DNR, but does not think the facility where she stays is aware ([facility]) so I recommended she inform them and provide a copy -seems like we will need to involve her family more due to her memory declining at this point and may need to involve [facility] care manager  Labs/tests ordered:  No new Next appt:  06/08/2020  Shenita Trego L. Ilyssa Grennan, D.O. [facility] [address], [place] [address] Cell Phone (Mon-Fri 8am-5pm):  [phone] On Call:  [phone] & follow prompts after 5pm & weekends Office Phone:  [phone] Office Fax:  [phone]

## 2020-03-16 NOTE — Patient Instructions (Addendum)
The [facility] at the [facility] would have access to your records if you need to transfer closer to your home now.  I recommend Dr. Carollee Herter or Dr. Penni Homans there.  Talk with your son about the MOST form and I'm happy to complete the official form for you.  Be sure to let the staff know at the [facility] that you have the DNR form.

## 2020-03-17 ENCOUNTER — Other Ambulatory Visit: Payer: Self-pay

## 2020-03-17 DIAGNOSIS — R600 Localized edema: Secondary | ICD-10-CM

## 2020-03-17 DIAGNOSIS — I10 Essential (primary) hypertension: Secondary | ICD-10-CM

## 2020-03-17 MED ORDER — METOPROLOL SUCCINATE ER 25 MG PO TB24
25.0000 mg | ORAL_TABLET | Freq: Every day | ORAL | 2 refills | Status: DC
Start: 2020-03-17 — End: 2020-06-26

## 2020-03-17 MED ORDER — POTASSIUM CHLORIDE ER 10 MEQ PO TBCR: EXTENDED_RELEASE_TABLET | ORAL | 0 refills | Status: DC

## 2020-03-17 MED ORDER — HYDROCHLOROTHIAZIDE 12.5 MG PO TABS
12.5000 mg | ORAL_TABLET | ORAL | 2 refills | Status: DC
Start: 2020-03-17 — End: 2021-06-12

## 2020-03-27 ENCOUNTER — Other Ambulatory Visit: Payer: Self-pay | Admitting: Internal Medicine

## 2020-04-17 ENCOUNTER — Encounter: Payer: Self-pay | Admitting: Internal Medicine

## 2020-04-17 DIAGNOSIS — H20041 Secondary noninfectious iridocyclitis, right eye: Secondary | ICD-10-CM | POA: Diagnosis not present

## 2020-04-17 DIAGNOSIS — H35351 Cystoid macular degeneration, right eye: Secondary | ICD-10-CM | POA: Diagnosis not present

## 2020-04-17 DIAGNOSIS — H18231 Secondary corneal edema, right eye: Secondary | ICD-10-CM | POA: Diagnosis not present

## 2020-04-17 LAB — HM DIABETES EYE EXAM

## 2020-04-18 ENCOUNTER — Encounter (INDEPENDENT_AMBULATORY_CARE_PROVIDER_SITE_OTHER): Payer: Medicare PPO | Admitting: Ophthalmology

## 2020-04-21 NOTE — Telephone Encounter (Signed)
error 

## 2020-04-24 ENCOUNTER — Other Ambulatory Visit: Payer: Self-pay

## 2020-04-24 ENCOUNTER — Encounter (INDEPENDENT_AMBULATORY_CARE_PROVIDER_SITE_OTHER): Payer: Self-pay | Admitting: Ophthalmology

## 2020-04-24 ENCOUNTER — Ambulatory Visit (INDEPENDENT_AMBULATORY_CARE_PROVIDER_SITE_OTHER): Payer: Medicare PPO | Admitting: Ophthalmology

## 2020-04-24 DIAGNOSIS — H35361 Drusen (degenerative) of macula, right eye: Secondary | ICD-10-CM | POA: Insufficient documentation

## 2020-04-24 DIAGNOSIS — H43811 Vitreous degeneration, right eye: Secondary | ICD-10-CM | POA: Diagnosis not present

## 2020-04-24 DIAGNOSIS — H353132 Nonexudative age-related macular degeneration, bilateral, intermediate dry stage: Secondary | ICD-10-CM

## 2020-04-24 DIAGNOSIS — H35351 Cystoid macular degeneration, right eye: Secondary | ICD-10-CM | POA: Diagnosis not present

## 2020-04-24 MED ORDER — KETOROLAC TROMETHAMINE 0.5 % OP SOLN: 1.0000 [drp] | Freq: Four times a day (QID) | OPHTHALMIC | 12 refills | Status: DC

## 2020-04-24 NOTE — Assessment & Plan Note (Signed)

## 2020-04-24 NOTE — Assessment & Plan Note (Addendum)
History of chronic recurrent CME OD probably pseudophakic in nature, now has recurred.  Patient has been off of topical NSAIDs, using "expensive 1" for the last several months.  This sounds like Ilevro  I will restart topical ketorolac 1 drop right eye 4 times daily as has been used in the past with the intent 1 day of curtailing and tapering this to twice daily

## 2020-04-24 NOTE — Progress Notes (Signed)
04/24/2020     CHIEF COMPLAINT Patient presents for Blurred Vision   HISTORY OF PRESENT ILLNESS: Mia [name] is a 86 y.o. female who presents to the clinic today for:   HPI    Blurred Vision    In right eye.  Onset was sudden.  Vision is blurred.  Severity is moderate.  This started 1 month ago.  Occurring constantly.  It is worse throughout the day.  Context:  distance vision.  Since onset it is gradually worsening.  Treatments tried include eye drops.  Response to treatment was no improvement.  I, the attending physician,  performed the HPI with the patient and updated documentation appropriately.          Comments    Pt referred back by Dr. Kathlen Mody for CME OD. OCT (LAST SEEN BY [place])  Pt is used Pred Forte QID OD ilevro QID OD for about 1 month. Pt states she fell a month ago and has issues with OD vision since then.        Last edited by Tilda Franco on 04/24/2020  1:54 PM. (History)      Referring physician: Gayland Curry, DO [place],  [place] [id number]  HISTORICAL INFORMATION:   Selected notes from the MEDICAL RECORD NUMBER    Lab Results  Component Value Date   HGBA1C 6.1 (H) 03/14/2020     CURRENT MEDICATIONS: Current Outpatient Medications (Ophthalmic Drugs)  Medication Sig  . ketorolac (ACULAR) 0.5 % ophthalmic solution Place 1 drop into the right eye 4 (four) times daily.  . prednisoLONE acetate (PRED FORTE) 1 % ophthalmic suspension Place into the right eye. (Patient not taking: Reported on 03/16/2020)   No current facility-administered medications for this visit. (Ophthalmic Drugs)   Current Outpatient Medications (Other)  Medication Sig  . allopurinol (ZYLOPRIM) 100 MG tablet Take 1 tablet (100 mg total) by mouth daily.  . cholecalciferol (VITAMIN D) 1000 UNITS tablet Take 1,000 Units by mouth daily. D3  . clopidogrel (PLAVIX) 75 MG tablet TAKE 1 TABLET(75 MG) BY MOUTH DAILY  . hydrochlorothiazide (HYDRODIURIL) 12.5 MG tablet  Take 1 tablet (12.5 mg total) by mouth every other day.  . metoprolol succinate (TOPROL-XL) 25 MG 24 hr tablet Take 1 tablet (25 mg total) by mouth daily.  . potassium chloride (KLOR-CON) 10 MEQ tablet TAKE 1 TABLET(10 MEQ) BY MOUTH DAILY   No current facility-administered medications for this visit. (Other)      REVIEW OF SYSTEMS:    ALLERGIES No Known Allergies  PAST MEDICAL HISTORY Past Medical History:  Diagnosis Date  . Arthritis    oa  . Chronic kidney disease    chronic kidney diseaase follow by primary md  . Diabetes mellitus without complication ([place])    Diet controlled  . Former smoker   . H/O ventricular fibrillation 10 yrs ago dx  . Hypertension   . Prediabetes   . Seasonal allergies   . TIA (transient ischemic attack) none recent   Past Surgical History:  Procedure Laterality Date  . NM MYOCAR PERF WALL MOTION  01/2008   lexiscan myoview - normal pattern of perfusion in all regions, EF 92%, no significant ischemia demonstated  . TONSILLECTOMY  age 25   and adenoids  . TOTAL KNEE ARTHROPLASTY Right 10/06/2015   Procedure: RIGHT TOTAL KNEE ARTHROPLASTY;  Surgeon: [name], MD;  Location: [facility];  Service: Orthopedics;  Laterality: Right;  . TRANSTHORACIC ECHOCARDIOGRAM  12/2005  mild DUST, EF=>55%; mild mitral annular calcif; mild-mod aortic root calcif    FAMILY HISTORY Family History  Problem Relation Age of Onset  . Congestive Heart Failure Mother   . Alcohol abuse Father   . Heart disease Son   . Bipolar disorder Son   . Lung cancer Brother   . Post-traumatic stress disorder Daughter     SOCIAL HISTORY Social History   Tobacco Use  . Smoking status: Former Smoker    Packs/day: 0.25    Years: 40.00    Pack years: 10.00    Types: Cigarettes    Quit date: 07/30/2000    Years since quitting: 19.7  . Smokeless tobacco: Never Used  Vaping Use  . Vaping Use: Never used  Substance Use Topics  . Alcohol use: Yes    Alcohol/week:  1.0 standard drink    Types: 1 Glasses of wine per week    Comment: every other day.  . Drug use: No         OPHTHALMIC EXAM:  Base Eye Exam    Visual Acuity (Snellen - Linear)      Right Left   Dist cc 20/30 -1 20/40 +   Dist ph cc  20/25 -1       Tonometry (Tonopen, 1:59 PM)      Right Left   Pressure 24 19       Pupils      Pupils Dark Light Shape React APD   Right PERRL 4 3 Round Brisk None   Left PERRL 4 3 Round Brisk None       Neuro/Psych    Oriented x3: Yes   Mood/Affect: Normal       Dilation    Both eyes: 1.0% Mydriacyl, 2.5% Phenylephrine @ 1:59 PM        Slit Lamp and Fundus Exam    External Exam      Right Left   External Normal Normal       Slit Lamp Exam      Right Left   Lids/Lashes Normal Normal   Conjunctiva/Sclera White and quiet White and quiet   Cornea Clear Clear   Anterior Chamber Deep and quiet Deep and quiet   Iris Round and reactive Round and reactive   Lens Centered posterior chamber intraocular lens Centered posterior chamber intraocular lens   Anterior Vitreous Normal Normal       Fundus Exam      Right Left   Posterior Vitreous Posterior vitreous detachment Posterior vitreous detachment   Disc Normal Normal   C/D Ratio 0.35 0.35   Macula Hard drusen Hard drusen   Vessels Normal Normal   Periphery Normal Normal          IMAGING AND PROCEDURES  Imaging and Procedures for 04/24/20  OCT, Retina - OU - Both Eyes       Right Eye Quality was good. Scan locations included subfoveal. Central Foveal Thickness: 364. Progression has worsened. Findings include cystoid macular edema.   Left Eye Quality was good. Scan locations included subfoveal. Central Foveal Thickness: 250. Progression has been stable.   Notes CME centrally OD, will resume topical ketorolac alone.  I will avoid topical steroids                ASSESSMENT/PLAN:  Cystoid macular edema of right eye History of chronic recurrent CME OD  probably pseudophakic in nature, now has recurred.  Patient has been off of topical NSAIDs, using "expensive 1" for the  last several months.  This sounds like Ilevro  I will restart topical ketorolac 1 drop right eye 4 times daily as has been used in the past with the intent 1 day of curtailing and tapering this to twice daily  Intermediate stage nonexudative age-related macular degeneration of both eyes The nature of age--related macular degeneration was discussed with the patient as well as the distinction between dry and wet types. Checking an Amsler Grid daily with advice to return immediately should a distortion develop, was given to the patient. The patient 's smoking status now and in the past was determined and advice based on the AREDS study was provided regarding the consumption of antioxidant supplements. AREDS 2 vitamin formulation was recommended. Consumption of dark leafy vegetables and fresh fruits of various colors was recommended. Treatment modalities for wet macular degeneration particularly the use of intravitreal injections of anti-blood vessel growth factors was discussed with the patient. Avastin, Lucentis, and Eylea are the available options. On occasion, therapy includes the use of photodynamic therapy and thermal laser. Stressed to the patient do not rub eyes.  Patient was advised to check Amsler Grid daily and return immediately if changes are noted. Instructions on using the grid were given to the patient. All patient questions were answered.      ICD-10-CM   1. Intermediate stage nonexudative age-related macular degeneration of both eyes  H35.3132 OCT, Retina - OU - Both Eyes  2. Posterior vitreous detachment of right eye  H43.811   3. Degenerative retinal drusen of right eye  H35.361   4. Cystoid macular edema of right eye  H35.351 OCT, Retina - OU - Both Eyes    1.  OD, will resume topical ketorolac 1 drop OD 4 times daily  2.  I reminded the patient not to use topical  steroids in the right eye for the time being  3.  Ophthalmic Meds Ordered this visit:  Meds ordered this encounter  Medications  . ketorolac (ACULAR) 0.5 % ophthalmic solution    Sig: Place 1 drop into the right eye 4 (four) times daily.    Dispense:  5 mL    Refill:  12       Return in about 6 weeks (around 06/05/2020) for dilate, OD, OCT.  There are no Patient Instructions on file for this visit.   Explained the diagnoses, plan, and follow up with the patient and they expressed understanding.  Patient expressed understanding of the importance of proper follow up care.   Clent Demark Evaleigh Mccamy M.D. Diseases & Surgery of the Retina and Vitreous [facility] [place] 04/24/20     Abbreviations: M myopia (nearsighted); A astigmatism; H hyperopia (farsighted); P presbyopia; Mrx spectacle prescription;  CTL contact lenses; OD right eye; OS left eye; OU both eyes  XT exotropia; ET esotropia; PEK punctate epithelial keratitis; PEE punctate epithelial erosions; DES dry eye syndrome; MGD meibomian gland dysfunction; ATs artificial tears; PFAT's preservative free artificial tears; [place] nuclear sclerotic cataract; PSC posterior subcapsular cataract; ERM epi-retinal membrane; PVD posterior vitreous detachment; RD retinal detachment; DM diabetes mellitus; DR diabetic retinopathy; NPDR non-proliferative diabetic retinopathy; PDR proliferative diabetic retinopathy; CSME clinically significant macular edema; DME diabetic macular edema; dbh dot blot hemorrhages; CWS cotton wool spot; POAG primary open angle glaucoma; C/D cup-to-disc ratio; HVF humphrey visual field; GVF goldmann visual field; OCT optical coherence tomography; IOP intraocular pressure; BRVO Branch retinal vein occlusion; CRVO central retinal vein occlusion; CRAO central retinal artery occlusion; BRAO branch retinal artery occlusion;  RT retinal tear; SB scleral buckle; PPV pars plana vitrectomy; VH Vitreous hemorrhage; PRP panretinal laser  photocoagulation; IVK intravitreal kenalog; VMT vitreomacular traction; MH Macular hole;  NVD neovascularization of the disc; NVE neovascularization elsewhere; AREDS age related eye disease study; ARMD age related macular degeneration; POAG primary open angle glaucoma; EBMD epithelial/anterior basement membrane dystrophy; ACIOL anterior chamber intraocular lens; IOL intraocular lens; PCIOL posterior chamber intraocular lens; Phaco/IOL phacoemulsification with intraocular lens placement; [place] photorefractive keratectomy; LASIK laser assisted in situ keratomileusis; HTN hypertension; DM diabetes mellitus; COPD chronic obstructive pulmonary disease

## 2020-06-05 ENCOUNTER — Encounter (INDEPENDENT_AMBULATORY_CARE_PROVIDER_SITE_OTHER): Payer: Medicare PPO | Admitting: Ophthalmology

## 2020-06-07 ENCOUNTER — Encounter (INDEPENDENT_AMBULATORY_CARE_PROVIDER_SITE_OTHER): Payer: Medicare PPO | Admitting: Ophthalmology

## 2020-06-08 ENCOUNTER — Ambulatory Visit: Payer: Medicare PPO | Admitting: Internal Medicine

## 2020-06-16 ENCOUNTER — Ambulatory Visit: Payer: Medicare PPO | Admitting: Podiatry

## 2020-06-19 ENCOUNTER — Other Ambulatory Visit: Payer: Self-pay

## 2020-06-19 ENCOUNTER — Ambulatory Visit (INDEPENDENT_AMBULATORY_CARE_PROVIDER_SITE_OTHER): Payer: Medicare PPO | Admitting: Ophthalmology

## 2020-06-19 ENCOUNTER — Encounter (INDEPENDENT_AMBULATORY_CARE_PROVIDER_SITE_OTHER): Payer: Self-pay | Admitting: Ophthalmology

## 2020-06-19 DIAGNOSIS — H353132 Nonexudative age-related macular degeneration, bilateral, intermediate dry stage: Secondary | ICD-10-CM | POA: Diagnosis not present

## 2020-06-19 DIAGNOSIS — H35351 Cystoid macular degeneration, right eye: Secondary | ICD-10-CM

## 2020-06-19 DIAGNOSIS — H43811 Vitreous degeneration, right eye: Secondary | ICD-10-CM

## 2020-06-19 MED ORDER — TRIAMCINOLONE ACETONIDE 40 MG/ML IJ SUSP FOR KALEIDOSCOPE
4.0000 mg | INTRAMUSCULAR | Status: AC | PRN
  Administered 2020-06-19: 4 mg via INTRAVITREAL

## 2020-06-19 MED ORDER — FLUORESCEIN SODIUM 10 % IV SOLN
500.0000 mg | INTRAVENOUS | Status: AC | PRN
  Administered 2020-06-19: 500 mg via INTRAVENOUS

## 2020-06-19 NOTE — Patient Instructions (Signed)
Patient to continue ketorolac (gray top) drop right eye 4 times daily

## 2020-06-19 NOTE — Assessment & Plan Note (Signed)
Recurrent CME and now persistent CME despite using topical NSAID 4 times daily.  We will need to get fluorescein angiography to rule out retinal vein occlusion or other underlying vascular disease

## 2020-06-19 NOTE — Progress Notes (Signed)
06/19/2020     CHIEF COMPLAINT Patient presents for Retina Follow Up (6 WK FU OD///Pt reports vision has improved OD. Pt denies any new F/F, pain, or pressure OD. )   HISTORY OF PRESENT ILLNESS: Mia [name] is a 87 y.o. female who presents to the clinic today for:   HPI    Retina Follow Up    Patient presents with  Wet AMD.  In right eye.  This started 6 weeks ago.  Duration of 6 weeks.  Since onset it is gradually improving. Additional comments: 6 WK FU OD   Pt reports vision has improved OD. Pt denies any new F/F, pain, or pressure OD.        Last edited by Nichola Sizer D on 06/19/2020  2:21 PM. (History)      Referring physician: Gayland Curry, DO [place],  [place] [id number]  HISTORICAL INFORMATION:   Selected notes from the MEDICAL RECORD NUMBER    Lab Results  Component Value Date   HGBA1C 6.1 (H) 03/14/2020     CURRENT MEDICATIONS: Current Outpatient Medications (Ophthalmic Drugs)  Medication Sig  . ketorolac (ACULAR) 0.5 % ophthalmic solution Place 1 drop into the right eye 4 (four) times daily.  . prednisoLONE acetate (PRED FORTE) 1 % ophthalmic suspension Place into the right eye. (Patient not taking: Reported on 03/16/2020)   No current facility-administered medications for this visit. (Ophthalmic Drugs)   Current Outpatient Medications (Other)  Medication Sig  . allopurinol (ZYLOPRIM) 100 MG tablet Take 1 tablet (100 mg total) by mouth daily.  . cholecalciferol (VITAMIN D) 1000 UNITS tablet Take 1,000 Units by mouth daily. D3  . clopidogrel (PLAVIX) 75 MG tablet TAKE 1 TABLET(75 MG) BY MOUTH DAILY  . hydrochlorothiazide (HYDRODIURIL) 12.5 MG tablet Take 1 tablet (12.5 mg total) by mouth every other day.  . metoprolol succinate (TOPROL-XL) 25 MG 24 hr tablet Take 1 tablet (25 mg total) by mouth daily.  . potassium chloride (KLOR-CON) 10 MEQ tablet TAKE 1 TABLET(10 MEQ) BY MOUTH DAILY   No current facility-administered medications for  this visit. (Other)      REVIEW OF SYSTEMS:    ALLERGIES No Known Allergies  PAST MEDICAL HISTORY Past Medical History:  Diagnosis Date  . Arthritis    oa  . Chronic kidney disease    chronic kidney diseaase follow by primary md  . Diabetes mellitus without complication ([place])    Diet controlled  . Former smoker   . H/O ventricular fibrillation 10 yrs ago dx  . Hypertension   . Prediabetes   . Seasonal allergies   . TIA (transient ischemic attack) none recent   Past Surgical History:  Procedure Laterality Date  . NM MYOCAR PERF WALL MOTION  01/2008   lexiscan myoview - normal pattern of perfusion in all regions, EF 92%, no significant ischemia demonstated  . TONSILLECTOMY  age 77   and adenoids  . TOTAL KNEE ARTHROPLASTY Right 10/06/2015   Procedure: RIGHT TOTAL KNEE ARTHROPLASTY;  Surgeon: [name], MD;  Location: [facility];  Service: Orthopedics;  Laterality: Right;  . TRANSTHORACIC ECHOCARDIOGRAM  12/2005   mild DUST, EF=>55%; mild mitral annular calcif; mild-mod aortic root calcif    FAMILY HISTORY Family History  Problem Relation Age of Onset  . Congestive Heart Failure Mother   . Alcohol abuse Father   . Heart disease Son   . Bipolar disorder Son   . Lung cancer Brother   . Post-traumatic  stress disorder Daughter     SOCIAL HISTORY Social History   Tobacco Use  . Smoking status: Former Smoker    Packs/day: 0.25    Years: 40.00    Pack years: 10.00    Types: Cigarettes    Quit date: 07/30/2000    Years since quitting: 19.9  . Smokeless tobacco: Never Used  Vaping Use  . Vaping Use: Never used  Substance Use Topics  . Alcohol use: Yes    Alcohol/week: 1.0 standard drink    Types: 1 Glasses of wine per week    Comment: every other day.  . Drug use: No         OPHTHALMIC EXAM:  Base Eye Exam    Visual Acuity (ETDRS)      Right Left   Dist cc 20/40 20/30 +2   Dist ph cc NI 20/20 -1   Correction: Glasses       Tonometry  (Tonopen, 2:30 PM)      Right Left   Pressure 23 20       Pupils      Pupils Dark Light Shape React APD   Right PERRL 4 3 Round Brisk None   Left PERRL 4 3 Round Brisk None       Visual Fields (Counting fingers)      Left Right    Full Full       Extraocular Movement      Right Left    Full Full       Neuro/Psych    Oriented x3: Yes   Mood/Affect: Normal       Dilation    Right eye: 1.0% Mydriacyl, 2.5% Phenylephrine @ 2:30 PM       Dilation #2    Left eye: 1.0% Mydriacyl, 2.5% Phenylephrine @ 3:17 PM        Slit Lamp and Fundus Exam    External Exam      Right Left   External Normal Normal       Slit Lamp Exam      Right Left   Lids/Lashes Normal Normal   Conjunctiva/Sclera White and quiet White and quiet   Cornea Clear Clear   Anterior Chamber Deep and quiet Deep and quiet   Iris Round and reactive Round and reactive   Lens Centered posterior chamber intraocular lens Centered posterior chamber intraocular lens   Anterior Vitreous Normal Normal       Fundus Exam      Right Left   Posterior Vitreous Posterior vitreous detachment    Disc Normal    C/D Ratio 0.35    Macula Hard drusen, Cystoid macular edema    Vessels Normal    Periphery Normal           IMAGING AND PROCEDURES  Imaging and Procedures for 06/19/20  OCT, Retina - OU - Both Eyes       Right Eye Quality was good. Scan locations included subfoveal. Central Foveal Thickness: 383. Findings include abnormal foveal contour, cystoid macular edema.   Left Eye Quality was good. Scan locations included subfoveal. Central Foveal Thickness: 256. Findings include abnormal foveal contour, retinal drusen .        Fluorescein Angiography Optos (Transit OD)       Injection:  500 mg Fluorescein Sodium 10 % injection   NDC: [id number]   Route: IntravenousRight Eye   Progression has worsened. Early phase findings include leakage. Mid/Late phase findings include leakage. Choroidal  neovascularization is not present.  Left Eye   Progression has been stable. Mid/Late phase findings include window defect. Choroidal neovascularization is not present.        Color Fundus Photography Optos - OU - Both Eyes       Right Eye Progression has worsened. Disc findings include normal observations. Macula : edema, drusen. Vessels : normal observations.   Left Eye Progression has improved. Disc findings include normal observations. Macula : drusen. Vessels : normal observations.   Notes No signs of active CNVM OU.  CME noted OD       Injection into Tenon's Capsule - OD - Right Eye       Time Out 06/19/2020. 3:47 PM. Confirmed correct patient, procedure, site, and patient consented.   Anesthesia No anesthesia was used.   Procedure A 27 gauge needle was used.   Injection:  4 mg KENALOG-'40mg'$ /ml injection '4mg'$    NDC: 0003-0293-05   Route: Intravitreal, Site: Right Eye  Post-op Post injection exam found visual acuity of at least counting fingers. The patient tolerated the procedure well.   Notes Retroseptal Kenalog delivered via 27-gauge needle, inferotemporal quadrant                ASSESSMENT/PLAN:  Cystoid macular edema of right eye Recurrent CME and now persistent CME despite using topical NSAID 4 times daily.  We will need to get fluorescein angiography to rule out retinal vein occlusion or other underlying vascular disease  Intermediate stage nonexudative age-related macular degeneration of both eyes The nature of age-related macular degeneration was discussed with the patient as well as the distinction between dry and wet types. Checking an Amsler Grid daily with advice to return immediately should a distortion develop, was given to the patient. The patient 's smoking status now and in the past was determined and advice based on the AREDS study was provided regarding the consumption of antioxidant supplements. AREDS 2 vitamin formulation was  recommended. Consumption of dark leafy vegetables and fresh fruits of various colors was recommended. Treatment modalities for wet macular degeneration particularly the use of intravitreal injections of anti-blood vessel growth factors was discussed with the patient. Avastin, Lucentis, and Eylea are the available options. On occasion, therapy includes the use of photodynamic therapy and thermal laser. Stressed to the patient do not rub eyes.  Patient was advised to check Amsler Grid daily and return immediately if changes are noted. Instructions on using the grid were given to the patient. All patient questions were answered.  Posterior vitreous detachment of right eye   The nature of posterior vitreous detachment was discussed with the patient as well as its physiology, its age prevalence, and its possible implication regarding retinal breaks and detachment.  An informational brochure was given to the patient.  All the patient's questions were answered.  The patient was asked to return if new or different flashes or floaters develops.   Patient was instructed to contact office immediately if any changes were noticed. I explained to the patient that vitreous inside the eye is similar to jello inside a bowl. As the jello melts it can start to pull away from the bowl, similarly the vitreous throughout our lives can begin to pull away from the retina. That process is called a posterior vitreous detachment. In some cases, the vitreous can tug hard enough on the retina to form a retinal tear. I discussed with the patient the signs and symptoms of a retinal detachment.  Do not rub the eye.      ICD-10-CM  1. Cystoid macular edema of right eye  H35.351 Fluorescein Angiography Optos (Transit OD)    Color Fundus Photography Optos - OU - Both Eyes    Fluorescein Sodium 10 % injection 500 mg    Injection into Tenon's Capsule - OD - Right Eye    triamcinolone acetonide (KENALOG-40) injection 4 mg  2. Posterior  vitreous detachment of right eye  H43.811 OCT, Retina - OU - Both Eyes  3. Intermediate stage nonexudative age-related macular degeneration of both eyes  H35.3132     1.  Patient instructed to continue on topical ketorolac 4 times daily to the right eye but we will add retroseptal Kenalog 40 mg today delivered atraumatically  2.  3.  Ophthalmic Meds Ordered this visit:  Meds ordered this encounter  Medications  . Fluorescein Sodium 10 % injection 500 mg  . triamcinolone acetonide (KENALOG-40) injection 4 mg       Return in about 6 weeks (around 07/31/2020) for dilate, OD, OCT.  Patient Instructions  Patient to continue ketorolac (gray top) drop right eye 4 times daily    Explained the diagnoses, plan, and follow up with the patient and they expressed understanding.  Patient expressed understanding of the importance of proper follow up care.   Clent Demark Rayman Petrosian M.D. Diseases & Surgery of the Retina and Vitreous Retina & Diabetic [place] 06/19/20     Abbreviations: M myopia (nearsighted); A astigmatism; H hyperopia (farsighted); P presbyopia; Mrx spectacle prescription;  CTL contact lenses; OD right eye; OS left eye; OU both eyes  XT exotropia; ET esotropia; PEK punctate epithelial keratitis; PEE punctate epithelial erosions; DES dry eye syndrome; MGD meibomian gland dysfunction; ATs artificial tears; PFAT's preservative free artificial tears; [place] nuclear sclerotic cataract; PSC posterior subcapsular cataract; ERM epi-retinal membrane; PVD posterior vitreous detachment; RD retinal detachment; DM diabetes mellitus; DR diabetic retinopathy; NPDR non-proliferative diabetic retinopathy; PDR proliferative diabetic retinopathy; CSME clinically significant macular edema; DME diabetic macular edema; dbh dot blot hemorrhages; CWS cotton wool spot; POAG primary open angle glaucoma; C/D cup-to-disc ratio; HVF humphrey visual field; GVF goldmann visual field; OCT optical coherence tomography;  IOP intraocular pressure; BRVO Branch retinal vein occlusion; CRVO central retinal vein occlusion; CRAO central retinal artery occlusion; BRAO branch retinal artery occlusion; RT retinal tear; SB scleral buckle; PPV pars plana vitrectomy; VH Vitreous hemorrhage; PRP panretinal laser photocoagulation; IVK intravitreal kenalog; VMT vitreomacular traction; MH Macular hole;  NVD neovascularization of the disc; NVE neovascularization elsewhere; AREDS age related eye disease study; ARMD age related macular degeneration; POAG primary open angle glaucoma; EBMD epithelial/anterior basement membrane dystrophy; ACIOL anterior chamber intraocular lens; IOL intraocular lens; PCIOL posterior chamber intraocular lens; Phaco/IOL phacoemulsification with intraocular lens placement; [place] photorefractive keratectomy; LASIK laser assisted in situ keratomileusis; HTN hypertension; DM diabetes mellitus; COPD chronic obstructive pulmonary disease

## 2020-06-19 NOTE — Assessment & Plan Note (Signed)

## 2020-06-19 NOTE — Assessment & Plan Note (Signed)

## 2020-06-20 ENCOUNTER — Ambulatory Visit: Payer: Medicare PPO | Admitting: Podiatry

## 2020-06-22 ENCOUNTER — Ambulatory Visit: Payer: Medicare PPO | Admitting: Internal Medicine

## 2020-06-25 ENCOUNTER — Other Ambulatory Visit: Payer: Self-pay | Admitting: Internal Medicine

## 2020-07-10 ENCOUNTER — Encounter: Payer: Self-pay | Admitting: Internal Medicine

## 2020-07-31 ENCOUNTER — Encounter (INDEPENDENT_AMBULATORY_CARE_PROVIDER_SITE_OTHER): Payer: Medicare PPO | Admitting: Ophthalmology

## 2020-08-09 ENCOUNTER — Encounter (INDEPENDENT_AMBULATORY_CARE_PROVIDER_SITE_OTHER): Payer: Medicare PPO | Admitting: Ophthalmology

## 2020-08-16 ENCOUNTER — Encounter (INDEPENDENT_AMBULATORY_CARE_PROVIDER_SITE_OTHER): Payer: Medicare PPO | Admitting: Ophthalmology

## 2020-08-23 ENCOUNTER — Other Ambulatory Visit: Payer: Self-pay

## 2020-08-23 ENCOUNTER — Encounter (INDEPENDENT_AMBULATORY_CARE_PROVIDER_SITE_OTHER): Payer: Self-pay | Admitting: Ophthalmology

## 2020-08-23 ENCOUNTER — Ambulatory Visit (INDEPENDENT_AMBULATORY_CARE_PROVIDER_SITE_OTHER): Payer: Medicare PPO | Admitting: Ophthalmology

## 2020-08-23 DIAGNOSIS — H35351 Cystoid macular degeneration, right eye: Secondary | ICD-10-CM | POA: Diagnosis not present

## 2020-08-23 DIAGNOSIS — H353132 Nonexudative age-related macular degeneration, bilateral, intermediate dry stage: Secondary | ICD-10-CM

## 2020-08-23 NOTE — Assessment & Plan Note (Signed)

## 2020-08-23 NOTE — Assessment & Plan Note (Signed)
Pseudophakic CME OD vastly improved as compared to prior visit 6 weeks previous 06-19-2020.  Currently on NSAID, ketorolac 1 drop OD 4 times daily we will asked the patient to

## 2020-08-23 NOTE — Progress Notes (Signed)
08/23/2020     CHIEF COMPLAINT Patient presents for Retina Follow Up (9 Wk F/U OD//Pt c/o increased floaters OD off and on x 9 weeks gradually. Pt denies FOL or changes to [place]. Pt sts floaters differ in shape OD. OS stable.)   HISTORY OF PRESENT ILLNESS: Mia [name] is a 87 y.o. female who presents to the clinic today for:   HPI    Retina Follow Up    Patient presents with  Other.  In right eye.  This started 9.  Duration of weeks.  Since onset it is stable. Additional comments: 9 Wk F/U OD  Pt c/o increased floaters OD off and on x 9 weeks gradually. Pt denies FOL or changes to [place]. Pt sts floaters differ in shape OD. OS stable.       Last edited by Rockie Neighbours, [place] on 08/23/2020 10:05 AM. (History)      Referring physician: Gayland Curry, DO [place],   [id number]  HISTORICAL INFORMATION:   Selected notes from the MEDICAL RECORD NUMBER    Lab Results  Component Value Date   HGBA1C 6.1 (H) 03/14/2020     CURRENT MEDICATIONS: Current Outpatient Medications (Ophthalmic Drugs)  Medication Sig  . ketorolac (ACULAR) 0.5 % ophthalmic solution Place 1 drop into the right eye 4 (four) times daily.  . prednisoLONE acetate (PRED FORTE) 1 % ophthalmic suspension Place into the right eye. (Patient not taking: No sig reported)   No current facility-administered medications for this visit. (Ophthalmic Drugs)   Current Outpatient Medications (Other)  Medication Sig  . allopurinol (ZYLOPRIM) 100 MG tablet Take 1 tablet (100 mg total) by mouth daily.  . cholecalciferol (VITAMIN D) 1000 UNITS tablet Take 1,000 Units by mouth daily. D3  . clopidogrel (PLAVIX) 75 MG tablet TAKE 1 TABLET(75 MG) BY MOUTH DAILY  . hydrochlorothiazide (HYDRODIURIL) 12.5 MG tablet Take 1 tablet (12.5 mg total) by mouth every other day.  . metoprolol succinate (TOPROL-XL) 25 MG 24 hr tablet TAKE 1 TABLET(25 MG) BY MOUTH DAILY  . potassium chloride (KLOR-CON) 10 MEQ tablet TAKE 1  TABLET(10 MEQ) BY MOUTH DAILY   No current facility-administered medications for this visit. (Other)      REVIEW OF SYSTEMS:    ALLERGIES No Known Allergies  PAST MEDICAL HISTORY Past Medical History:  Diagnosis Date  . Arthritis    oa  . Chronic kidney disease    chronic kidney diseaase follow by primary md  . Diabetes mellitus without complication ([place])    Diet controlled  . Former smoker   . H/O ventricular fibrillation 10 yrs ago dx  . Hypertension   . Prediabetes   . Seasonal allergies   . TIA (transient ischemic attack) none recent   Past Surgical History:  Procedure Laterality Date  . NM MYOCAR PERF WALL MOTION  01/2008   lexiscan myoview - normal pattern of perfusion in all regions, EF 92%, no significant ischemia demonstated  . TONSILLECTOMY  age 89   and adenoids  . TOTAL KNEE ARTHROPLASTY Right 10/06/2015   Procedure: RIGHT TOTAL KNEE ARTHROPLASTY;  Surgeon: [name], MD;  Location: [facility];  Service: Orthopedics;  Laterality: Right;  . TRANSTHORACIC ECHOCARDIOGRAM  12/2005   mild DUST, EF=>55%; mild mitral annular calcif; mild-mod aortic root calcif    FAMILY HISTORY Family History  Problem Relation Age of Onset  . Congestive Heart Failure Mother   . Alcohol abuse Father   . Heart disease  Son   . Bipolar disorder Son   . Lung cancer Brother   . Post-traumatic stress disorder Daughter     SOCIAL HISTORY Social History   Tobacco Use  . Smoking status: Former Smoker    Packs/day: 0.25    Years: 40.00    Pack years: 10.00    Types: Cigarettes    Quit date: 07/30/2000    Years since quitting: 20.0  . Smokeless tobacco: Never Used  Vaping Use  . Vaping Use: Never used  Substance Use Topics  . Alcohol use: Yes    Alcohol/week: 1.0 standard drink    Types: 1 Glasses of wine per week    Comment: every other day.  . Drug use: No         OPHTHALMIC EXAM:  Base Eye Exam    Visual Acuity (ETDRS)      Right Left   Dist cc 20/40  +2 20/25 -2   Dist ph cc NI    Correction: Glasses       Tonometry (Tonopen, 10:06 AM)      Right Left   Pressure 24 21       Pupils      Pupils Dark Light Shape React APD   Right PERRL 5 4 Round Brisk None   Left PERRL 5 4 Round Brisk None       Visual Fields (Counting fingers)      Left Right   Restrictions Total superior temporal, inferior temporal deficiencies Total superior temporal, inferior temporal deficiencies       Extraocular Movement      Right Left    Full Full       Neuro/Psych    Oriented x3: Yes   Mood/Affect: Normal       Dilation    Right eye: 1.0% Mydriacyl, 2.5% Phenylephrine @ 10:11 AM        Slit Lamp and Fundus Exam    External Exam      Right Left   External Normal Normal       Slit Lamp Exam      Right Left   Lids/Lashes Normal Normal   Conjunctiva/Sclera White and quiet White and quiet   Cornea Clear Clear   Anterior Chamber Deep and quiet Deep and quiet   Iris Round and reactive Round and reactive   Lens Centered posterior chamber intraocular lens Centered posterior chamber intraocular lens   Anterior Vitreous Normal Normal       Fundus Exam      Right Left   Posterior Vitreous Posterior vitreous detachment    Disc Normal    C/D Ratio 0.35    Macula Hard drusen, Cystoid macular edema    Vessels Normal    Periphery Normal           IMAGING AND PROCEDURES  Imaging and Procedures for 08/23/20  OCT, Retina - OU - Both Eyes       Right Eye Quality was good. Scan locations included subfoveal. Central Foveal Thickness: 244. Findings include abnormal foveal contour, cystoid macular edema.   Left Eye Quality was good. Scan locations included subfoveal. Central Foveal Thickness: 245. Findings include abnormal foveal contour, retinal drusen .   Notes Resolved CME OD, status post consistent use of topical NSAID, ketorolac 4 times daily OD will need to continue                ASSESSMENT/PLAN:  Cystoid macular  edema of right eye Pseudophakic CME OD vastly improved  as compared to prior visit 6 weeks previous 06-19-2020.  Currently on NSAID, ketorolac 1 drop OD 4 times daily we will asked the patient to  Intermediate stage nonexudative age-related macular degeneration of both eyes The nature of age--related macular degeneration was discussed with the patient as well as the distinction between dry and wet types. Checking an Amsler Grid daily with advice to return immediately should a distortion develop, was given to the patient. The patient 's smoking status now and in the past was determined and advice based on the AREDS study was provided regarding the consumption of antioxidant supplements. AREDS 2 vitamin formulation was recommended. Consumption of dark leafy vegetables and fresh fruits of various colors was recommended. Treatment modalities for wet macular degeneration particularly the use of intravitreal injections of anti-blood vessel growth factors was discussed with the patient. Avastin, Lucentis, and Eylea are the available options. On occasion, therapy includes the use of photodynamic therapy and thermal laser. Stressed to the patient do not rub eyes.  Patient was advised to check Amsler Grid daily and return immediately if changes are noted. Instructions on using the grid were given to the patient. All patient questions were answered.      ICD-10-CM   1. Cystoid macular edema of right eye  H35.351 OCT, Retina - OU - Both Eyes  2. Intermediate stage nonexudative age-related macular degeneration of both eyes  H35.3132     1.  Pseudophakic CME OD now has resolved.  On topical ketorolac 1 drop OD 4 times daily as compared to 3 times daily last visit.  2.  We will asked the patient continue on 4 times daily but also not to compress rub or mash the eye.  3.  Follow-up with Dr. Kathlen Mody as scheduled or as needed for final refraction and routine examination  Ophthalmic Meds Ordered this visit:  No orders  of the defined types were placed in this encounter.      Return in about 3 months (around 11/22/2020) for DILATE OU, OCT.  There are no Patient Instructions on file for this visit.   Explained the diagnoses, plan, and follow up with the patient and they expressed understanding.  Patient expressed understanding of the importance of proper follow up care.   Clent Demark Yemaya Barnier M.D. Diseases & Surgery of the Retina and Vitreous Retina & Diabetic [place] 08/23/20     Abbreviations: M myopia (nearsighted); A astigmatism; H hyperopia (farsighted); P presbyopia; Mrx spectacle prescription;  CTL contact lenses; OD right eye; OS left eye; OU both eyes  XT exotropia; ET esotropia; PEK punctate epithelial keratitis; PEE punctate epithelial erosions; DES dry eye syndrome; MGD meibomian gland dysfunction; ATs artificial tears; PFAT's preservative free artificial tears; [place] nuclear sclerotic cataract; PSC posterior subcapsular cataract; ERM epi-retinal membrane; PVD posterior vitreous detachment; RD retinal detachment; DM diabetes mellitus; DR diabetic retinopathy; NPDR non-proliferative diabetic retinopathy; PDR proliferative diabetic retinopathy; CSME clinically significant macular edema; DME diabetic macular edema; dbh dot blot hemorrhages; CWS cotton wool spot; POAG primary open angle glaucoma; C/D cup-to-disc ratio; HVF humphrey visual field; GVF goldmann visual field; OCT optical coherence tomography; IOP intraocular pressure; BRVO Branch retinal vein occlusion; CRVO central retinal vein occlusion; CRAO central retinal artery occlusion; BRAO branch retinal artery occlusion; RT retinal tear; SB scleral buckle; PPV pars plana vitrectomy; VH Vitreous hemorrhage; PRP panretinal laser photocoagulation; IVK intravitreal kenalog; VMT vitreomacular traction; MH Macular hole;  NVD neovascularization of the disc; NVE neovascularization elsewhere; AREDS age related eye disease study; ARMD age  related macular  degeneration; POAG primary open angle glaucoma; EBMD epithelial/anterior basement membrane dystrophy; ACIOL anterior chamber intraocular lens; IOL intraocular lens; PCIOL posterior chamber intraocular lens; Phaco/IOL phacoemulsification with intraocular lens placement; [place] photorefractive keratectomy; LASIK laser assisted in situ keratomileusis; HTN hypertension; DM diabetes mellitus; COPD chronic obstructive pulmonary disease

## 2020-08-28 DIAGNOSIS — M79641 Pain in right hand: Secondary | ICD-10-CM | POA: Diagnosis not present

## 2020-08-28 DIAGNOSIS — M6281 Muscle weakness (generalized): Secondary | ICD-10-CM | POA: Diagnosis not present

## 2020-08-28 DIAGNOSIS — M25511 Pain in right shoulder: Secondary | ICD-10-CM | POA: Diagnosis not present

## 2020-08-28 DIAGNOSIS — M25512 Pain in left shoulder: Secondary | ICD-10-CM | POA: Diagnosis not present

## 2020-08-30 DIAGNOSIS — M25512 Pain in left shoulder: Secondary | ICD-10-CM | POA: Diagnosis not present

## 2020-08-30 DIAGNOSIS — M6281 Muscle weakness (generalized): Secondary | ICD-10-CM | POA: Diagnosis not present

## 2020-08-30 DIAGNOSIS — M79641 Pain in right hand: Secondary | ICD-10-CM | POA: Diagnosis not present

## 2020-08-30 DIAGNOSIS — M25511 Pain in right shoulder: Secondary | ICD-10-CM | POA: Diagnosis not present

## 2020-09-01 DIAGNOSIS — M6281 Muscle weakness (generalized): Secondary | ICD-10-CM | POA: Diagnosis not present

## 2020-09-01 DIAGNOSIS — M25512 Pain in left shoulder: Secondary | ICD-10-CM | POA: Diagnosis not present

## 2020-09-01 DIAGNOSIS — M25511 Pain in right shoulder: Secondary | ICD-10-CM | POA: Diagnosis not present

## 2020-09-01 DIAGNOSIS — M79641 Pain in right hand: Secondary | ICD-10-CM | POA: Diagnosis not present

## 2020-09-05 DIAGNOSIS — M25512 Pain in left shoulder: Secondary | ICD-10-CM | POA: Diagnosis not present

## 2020-09-05 DIAGNOSIS — M79641 Pain in right hand: Secondary | ICD-10-CM | POA: Diagnosis not present

## 2020-09-05 DIAGNOSIS — M6281 Muscle weakness (generalized): Secondary | ICD-10-CM | POA: Diagnosis not present

## 2020-09-05 DIAGNOSIS — M25511 Pain in right shoulder: Secondary | ICD-10-CM | POA: Diagnosis not present

## 2020-09-06 DIAGNOSIS — M25512 Pain in left shoulder: Secondary | ICD-10-CM | POA: Diagnosis not present

## 2020-09-06 DIAGNOSIS — M6281 Muscle weakness (generalized): Secondary | ICD-10-CM | POA: Diagnosis not present

## 2020-09-06 DIAGNOSIS — M79641 Pain in right hand: Secondary | ICD-10-CM | POA: Diagnosis not present

## 2020-09-06 DIAGNOSIS — M25511 Pain in right shoulder: Secondary | ICD-10-CM | POA: Diagnosis not present

## 2020-09-07 DIAGNOSIS — M6281 Muscle weakness (generalized): Secondary | ICD-10-CM | POA: Diagnosis not present

## 2020-09-07 DIAGNOSIS — M79641 Pain in right hand: Secondary | ICD-10-CM | POA: Diagnosis not present

## 2020-09-07 DIAGNOSIS — M25512 Pain in left shoulder: Secondary | ICD-10-CM | POA: Diagnosis not present

## 2020-09-07 DIAGNOSIS — M25511 Pain in right shoulder: Secondary | ICD-10-CM | POA: Diagnosis not present

## 2020-09-11 DIAGNOSIS — M25511 Pain in right shoulder: Secondary | ICD-10-CM | POA: Diagnosis not present

## 2020-09-11 DIAGNOSIS — M25512 Pain in left shoulder: Secondary | ICD-10-CM | POA: Diagnosis not present

## 2020-09-11 DIAGNOSIS — M6281 Muscle weakness (generalized): Secondary | ICD-10-CM | POA: Diagnosis not present

## 2020-09-11 DIAGNOSIS — M79641 Pain in right hand: Secondary | ICD-10-CM | POA: Diagnosis not present

## 2020-09-13 DIAGNOSIS — M25512 Pain in left shoulder: Secondary | ICD-10-CM | POA: Diagnosis not present

## 2020-09-13 DIAGNOSIS — M79641 Pain in right hand: Secondary | ICD-10-CM | POA: Diagnosis not present

## 2020-09-13 DIAGNOSIS — M6281 Muscle weakness (generalized): Secondary | ICD-10-CM | POA: Diagnosis not present

## 2020-09-13 DIAGNOSIS — M25511 Pain in right shoulder: Secondary | ICD-10-CM | POA: Diagnosis not present

## 2020-09-15 DIAGNOSIS — M79641 Pain in right hand: Secondary | ICD-10-CM | POA: Diagnosis not present

## 2020-09-15 DIAGNOSIS — M25512 Pain in left shoulder: Secondary | ICD-10-CM | POA: Diagnosis not present

## 2020-09-15 DIAGNOSIS — M25511 Pain in right shoulder: Secondary | ICD-10-CM | POA: Diagnosis not present

## 2020-09-15 DIAGNOSIS — M6281 Muscle weakness (generalized): Secondary | ICD-10-CM | POA: Diagnosis not present

## 2020-10-06 ENCOUNTER — Encounter: Payer: Medicare PPO | Admitting: Family

## 2020-11-10 ENCOUNTER — Other Ambulatory Visit: Payer: Self-pay

## 2020-11-10 ENCOUNTER — Telehealth: Payer: Self-pay

## 2020-11-10 ENCOUNTER — Encounter: Payer: Medicare PPO | Admitting: Family

## 2020-11-10 NOTE — Telephone Encounter (Signed)
I made 3 unsuccessful attempts to call patient. Times are 11:19am, 11:29am, and 11:36am. Voicemails were left for each call. Patient advised to call office back and reschedule.

## 2020-11-10 NOTE — Telephone Encounter (Signed)
Noted  

## 2020-11-22 ENCOUNTER — Encounter (INDEPENDENT_AMBULATORY_CARE_PROVIDER_SITE_OTHER): Payer: Medicare PPO | Admitting: Ophthalmology

## 2020-11-28 ENCOUNTER — Encounter (INDEPENDENT_AMBULATORY_CARE_PROVIDER_SITE_OTHER): Payer: Self-pay | Admitting: Ophthalmology

## 2020-11-28 ENCOUNTER — Other Ambulatory Visit: Payer: Self-pay

## 2020-11-28 ENCOUNTER — Ambulatory Visit (INDEPENDENT_AMBULATORY_CARE_PROVIDER_SITE_OTHER): Payer: Medicare PPO | Admitting: Ophthalmology

## 2020-11-28 DIAGNOSIS — H35351 Cystoid macular degeneration, right eye: Secondary | ICD-10-CM | POA: Diagnosis not present

## 2020-11-28 DIAGNOSIS — H353132 Nonexudative age-related macular degeneration, bilateral, intermediate dry stage: Secondary | ICD-10-CM

## 2020-11-28 NOTE — Assessment & Plan Note (Signed)
Minor minor OU

## 2020-11-28 NOTE — Assessment & Plan Note (Signed)
Recurrent on multiple occasions pseudophakic CME late onset right eye, now again resolved on topical ketorolac 4 times daily.  We will attempt to use a chronic long going long-term basis NSAID, ketorolac 3 times daily right eye in order to prevent recurrences developing repair patient understands critical importance of not compressing mashing rubbing the eye intentionally

## 2020-11-28 NOTE — Progress Notes (Signed)
11/28/2020     CHIEF COMPLAINT Patient presents for Retina Follow Up (3 month fu OU and OCT/Pt states VA OU stable since last visit. Pt denies FOL, floaters, or ocular pain OU. /Pt reports using Ketorolac QID OD /)   HISTORY OF PRESENT ILLNESS: Mia [name] is a 87 y.o. female who presents to the clinic today for:   HPI     Retina Follow Up           Diagnosis: Other   Laterality: both eyes   Onset: 3 months ago   Severity: mild   Duration: 3 months   Course: stable   Comments: 3 month fu OU and OCT Pt states VA OU stable since last visit. Pt denies FOL, floaters, or ocular pain OU.  Pt reports using Ketorolac QID OD         Last edited by Kendra Opitz, COA on 11/28/2020  3:18 PM.      Referring physician: Hortencia Pilar, MD [place],  [place] [address]  HISTORICAL INFORMATION:   Selected notes from the MEDICAL RECORD NUMBER    Lab Results  Component Value Date   HGBA1C 6.1 (H) 03/14/2020     CURRENT MEDICATIONS: Current Outpatient Medications (Ophthalmic Drugs)  Medication Sig   ketorolac (ACULAR) 0.5 % ophthalmic solution Place 1 drop into the right eye 4 (four) times daily.   prednisoLONE acetate (PRED FORTE) 1 % ophthalmic suspension Place into the right eye. (Patient not taking: No sig reported)   No current facility-administered medications for this visit. (Ophthalmic Drugs)   Current Outpatient Medications (Other)  Medication Sig   allopurinol (ZYLOPRIM) 100 MG tablet Take 1 tablet (100 mg total) by mouth daily.   cholecalciferol (VITAMIN D) 1000 UNITS tablet Take 1,000 Units by mouth daily. D3   clopidogrel (PLAVIX) 75 MG tablet TAKE 1 TABLET(75 MG) BY MOUTH DAILY   hydrochlorothiazide (HYDRODIURIL) 12.5 MG tablet Take 1 tablet (12.5 mg total) by mouth every other day.   metoprolol succinate (TOPROL-XL) 25 MG 24 hr tablet TAKE 1 TABLET(25 MG) BY MOUTH DAILY   potassium chloride (KLOR-CON) 10 MEQ tablet TAKE 1 TABLET(10  MEQ) BY MOUTH DAILY   No current facility-administered medications for this visit. (Other)      REVIEW OF SYSTEMS:    ALLERGIES No Known Allergies  PAST MEDICAL HISTORY Past Medical History:  Diagnosis Date   Arthritis    oa   Chronic kidney disease    chronic kidney diseaase follow by primary md   Diabetes mellitus without complication ([place])    Diet controlled   Former smoker    H/O ventricular fibrillation 10 yrs ago dx   Hypertension    Prediabetes    Seasonal allergies    TIA (transient ischemic attack) none recent   Past Surgical History:  Procedure Laterality Date   NM MYOCAR PERF WALL MOTION  01/2008   lexiscan myoview - normal pattern of perfusion in all regions, EF 92%, no significant ischemia demonstated   TONSILLECTOMY  age 48   and adenoids   TOTAL KNEE ARTHROPLASTY Right 10/06/2015   Procedure: RIGHT TOTAL KNEE ARTHROPLASTY;  Surgeon: [name], MD;  Location: [facility];  Service: Orthopedics;  Laterality: Right;   TRANSTHORACIC ECHOCARDIOGRAM  12/2005   mild DUST, EF=>55%; mild mitral annular calcif; mild-mod aortic root calcif    FAMILY HISTORY Family History  Problem Relation Age of Onset   Congestive Heart Failure Mother    Alcohol  abuse Father    Heart disease Son    Bipolar disorder Son    Lung cancer Brother    Post-traumatic stress disorder Daughter     SOCIAL HISTORY Social History   Tobacco Use   Smoking status: Former    Packs/day: 0.25    Years: 40.00    Pack years: 10.00    Types: Cigarettes    Quit date: 07/30/2000    Years since quitting: 20.3   Smokeless tobacco: Never  Vaping Use   Vaping Use: Never used  Substance Use Topics   Alcohol use: Yes    Alcohol/week: 1.0 standard drink    Types: 1 Glasses of wine per week    Comment: every other day.   Drug use: No         OPHTHALMIC EXAM:  Base Eye Exam     Visual Acuity (ETDRS)       Right Left   Dist cc 20/40 -2 20/25 -2   Dist ph cc NI           Tonometry (Tonopen, 3:21 PM)       Right Left   Pressure 29 22         Tonometry #2 (Tonopen, 3:21 PM)       Right Left   Pressure 28          Pupils       Pupils Dark Light Shape React APD   Right PERRL 5 4 Round Brisk None   Left PERRL 5 4 Round Brisk None         Visual Fields       Left Right   Restrictions Total superior temporal, inferior temporal deficiencies Total superior temporal, inferior temporal deficiencies         Extraocular Movement       Right Left    Full Full         Neuro/Psych     Oriented x3: Yes   Mood/Affect: Normal         Dilation     Both eyes: 1.0% Mydriacyl, 2.5% Phenylephrine @ 3:21 PM           Slit Lamp and Fundus Exam     External Exam       Right Left   External Normal Normal         Slit Lamp Exam       Right Left   Lids/Lashes Normal Normal   Conjunctiva/Sclera White and quiet White and quiet   Cornea Clear Clear   Anterior Chamber Deep and quiet Deep and quiet   Iris Round and reactive Round and reactive   Lens Centered posterior chamber intraocular lens Centered posterior chamber intraocular lens   Anterior Vitreous Normal Normal         Fundus Exam       Right Left   Posterior Vitreous Posterior vitreous detachment Posterior vitreous detachment   Disc Normal Normal   C/D Ratio 0.35 0.35   Macula Hard drusen,  Hard drusen   Vessels Normal Normal   Periphery Normal Normal            IMAGING AND PROCEDURES  Imaging and Procedures for 11/28/20  OCT, Retina - OU - Both Eyes       Right Eye Quality was good. Scan locations included subfoveal. Central Foveal Thickness: 247. Progression has improved. Findings include abnormal foveal contour, retinal drusen .   Left Eye Quality was good. Scan locations included subfoveal. Central  Foveal Thickness: 248. Progression has improved. Findings include abnormal foveal contour, retinal drusen .   Notes Resolved CME OD, status post  consistent use of topical NSAID, ketorolac 3 times daily OD will need to continue             ASSESSMENT/PLAN:  Cystoid macular edema of right eye Recurrent on multiple occasions pseudophakic CME late onset right eye, now again resolved on topical ketorolac 4 times daily.  We will attempt to use a chronic long going long-term basis NSAID, ketorolac 3 times daily right eye in order to prevent recurrences developing repair patient understands critical importance of not compressing mashing rubbing the eye intentionally  Intermediate stage nonexudative age-related macular degeneration of both eyes Minor minor OU     ICD-10-CM   1. Cystoid macular edema of right eye  H35.351     2. Intermediate stage nonexudative age-related macular degeneration of both eyes  H35.3132 OCT, Retina - OU - Both Eyes      1.  OD has a history of chronic recurrent pseudophakic CME.  There is no obvious iris chafe, no obvious TIs seen.  Thus we will treat this as idiopathic.  This does resolve an NSAID, ketorolac topically 4 times daily.  We will try to encourage the patient to use the topical ketorolac 3 times daily for long-term basis to prevent the recurrence  2.  Intermediate ARMD OU Minor  3.  Patient interested in following up for glasses and particularly reading glasses prescription with Dr. Kathlen Mody  Ophthalmic Meds Ordered this visit:  No orders of the defined types were placed in this encounter.      Return in about 6 months (around 05/31/2021) for DILATE OU, OCT.  There are no Patient Instructions on file for this visit.   Explained the diagnoses, plan, and follow up with the patient and they expressed understanding.  Patient expressed understanding of the importance of proper follow up care.   Clent Demark Sharmeka Palmisano M.D. Diseases & Surgery of the Retina and Vitreous [facility] [place] 11/28/20     Abbreviations: M myopia (nearsighted); A astigmatism; H hyperopia (farsighted); P  presbyopia; Mrx spectacle prescription;  CTL contact lenses; OD right eye; OS left eye; OU both eyes  XT exotropia; ET esotropia; PEK punctate epithelial keratitis; PEE punctate epithelial erosions; DES dry eye syndrome; MGD meibomian gland dysfunction; ATs artificial tears; PFAT's preservative free artificial tears; [place] nuclear sclerotic cataract; PSC posterior subcapsular cataract; ERM epi-retinal membrane; PVD posterior vitreous detachment; RD retinal detachment; DM diabetes mellitus; DR diabetic retinopathy; NPDR non-proliferative diabetic retinopathy; PDR proliferative diabetic retinopathy; CSME clinically significant macular edema; DME diabetic macular edema; dbh dot blot hemorrhages; CWS cotton wool spot; POAG primary open angle glaucoma; C/D cup-to-disc ratio; HVF humphrey visual field; GVF goldmann visual field; OCT optical coherence tomography; IOP intraocular pressure; BRVO Branch retinal vein occlusion; CRVO central retinal vein occlusion; CRAO central retinal artery occlusion; BRAO branch retinal artery occlusion; RT retinal tear; SB scleral buckle; PPV pars plana vitrectomy; VH Vitreous hemorrhage; PRP panretinal laser photocoagulation; IVK intravitreal kenalog; VMT vitreomacular traction; MH Macular hole;  NVD neovascularization of the disc; NVE neovascularization elsewhere; AREDS age related eye disease study; ARMD age related macular degeneration; POAG primary open angle glaucoma; EBMD epithelial/anterior basement membrane dystrophy; ACIOL anterior chamber intraocular lens; IOL intraocular lens; PCIOL posterior chamber intraocular lens; Phaco/IOL phacoemulsification with intraocular lens placement; [place] photorefractive keratectomy; LASIK laser assisted in situ keratomileusis; HTN hypertension; DM diabetes mellitus; COPD chronic obstructive pulmonary disease

## 2021-02-20 ENCOUNTER — Telehealth: Payer: Self-pay | Admitting: Family

## 2021-02-20 NOTE — Telephone Encounter (Addendum)
Noted.Please notify Home health awaiting for patient response for follow up visit.

## 2021-02-20 NOTE — Telephone Encounter (Signed)
I called the patient and her son, and asked if she was still wanting [place] as her PCP or if she had another PCP and her son said he would discuss with her and then call us back.. so unsure on that I told him she has not been seen in over a year so we cant sign orders & medication refills

## 2021-03-26 ENCOUNTER — Other Ambulatory Visit: Payer: Self-pay

## 2021-03-26 ENCOUNTER — Encounter: Payer: Self-pay | Admitting: Adult Health

## 2021-03-26 ENCOUNTER — Ambulatory Visit (INDEPENDENT_AMBULATORY_CARE_PROVIDER_SITE_OTHER): Payer: Medicare PPO | Admitting: Adult Health

## 2021-03-26 VITALS — BP 120/82 | HR 70 | Temp 96.2°F | Ht 67.0 in | Wt 135.2 lb

## 2021-03-26 DIAGNOSIS — I10 Essential (primary) hypertension: Secondary | ICD-10-CM

## 2021-03-26 DIAGNOSIS — Z8673 Personal history of transient ischemic attack (TIA), and cerebral infarction without residual deficits: Secondary | ICD-10-CM

## 2021-03-26 DIAGNOSIS — E559 Vitamin D deficiency, unspecified: Secondary | ICD-10-CM

## 2021-03-26 DIAGNOSIS — R600 Localized edema: Secondary | ICD-10-CM

## 2021-03-26 DIAGNOSIS — M10042 Idiopathic gout, left hand: Secondary | ICD-10-CM

## 2021-03-26 DIAGNOSIS — Z23 Encounter for immunization: Secondary | ICD-10-CM

## 2021-03-26 MED ORDER — PREDNISONE 20 MG PO TABS: 20.0000 mg | ORAL_TABLET | Freq: Two times a day (BID) | ORAL | 0 refills | Status: AC

## 2021-03-26 NOTE — Patient Instructions (Signed)
Gout ?Gout is a condition that causes painful swelling of the joints. Gout is a type of inflammation of the joints (arthritis). This condition is caused by having too much uric acid in the body. Uric acid is a chemical that forms when the body breaks down substances called purines. Purines are important for building body proteins. ?When the body has too much uric acid, sharp crystals can form and build up inside the joints. This causes pain and swelling. Gout attacks can happen quickly and may be very painful (acute gout). Over time, the attacks can affect more joints and become more frequent (chronic gout). Gout can also cause uric acid to build up under the skin and inside the kidneys. ?What are the causes? ?This condition is caused by too much uric acid in your blood. This can happen because: ?Your kidneys do not remove enough uric acid from your blood. This is the most common cause. ?Your body makes too much uric acid. This can happen with some cancers and cancer treatments. It can also occur if your body is breaking down too many red blood cells (hemolytic anemia). ?You eat too many foods that are high in purines. These foods include organ meats and some seafood. Alcohol, especially beer, is also high in purines. ?A gout attack may be triggered by trauma or stress. ?What increases the risk? ?You are more likely to develop this condition if you: ?Have a family history of gout. ?Are female and middle-aged. ?Are female and have gone through menopause. ?Are obese. ?Frequently drink alcohol, especially beer. ?Are dehydrated. ?Lose weight too quickly. ?Have an organ transplant. ?Have lead poisoning. ?Take certain medicines, including aspirin, cyclosporine, diuretics, levodopa, and niacin. ?Have kidney disease. ?Have a skin condition called psoriasis. ?What are the signs or symptoms? ?An attack of acute gout happens quickly. It usually occurs in just one joint. The most common place is the big toe. Attacks often start  at night. Other joints that may be affected include joints of the feet, ankle, knee, fingers, wrist, or elbow. Symptoms of this condition may include: ?Severe pain. ?Warmth. ?Swelling. ?Stiffness. ?Tenderness. The affected joint may be very painful to touch. ?Shiny, red, or purple skin. ?Chills and fever. ?Chronic gout may cause symptoms more frequently. More joints may be involved. You may also have white or yellow lumps (tophi) on your hands or feet or in other areas near your joints. ?How is this diagnosed? ?This condition is diagnosed based on your symptoms, medical history, and physical exam. You may have tests, such as: ?Blood tests to measure uric acid levels. ?Removal of joint fluid with a thin needle (aspiration) to look for uric acid crystals. ?X-rays to look for joint damage. ?How is this treated? ?Treatment for this condition has two phases: treating an acute attack and preventing future attacks. Acute gout treatment may include medicines to reduce pain and swelling, including: ?NSAIDs. ?Steroids. These are strong anti-inflammatory medicines that can be taken by mouth (orally) or injected into a joint. ?Colchicine. This medicine relieves pain and swelling when it is taken soon after an attack. It can be given by mouth or through an IV. ?Preventive treatment may include: ?Daily use of smaller doses of NSAIDs or colchicine. ?Use of a medicine that reduces uric acid levels in your blood. ?Changes to your diet. You may need to see a dietitian about what to eat and drink to prevent gout. ?Follow these instructions at home: ?During a gout attack ? ?If directed, put ice on the affected area: ?  Put ice in a plastic bag. ?Place a towel between your skin and the bag. ?Leave the ice on for 20 minutes, 2-3 times a day. ?Raise (elevate) the affected joint above the level of your heart as often as possible. ?Rest the joint as much as possible. If the affected joint is in your leg, you may be given crutches to  use. ?Follow instructions from your health care provider about eating or drinking restrictions. ?Avoiding future gout attacks ?Follow a low-purine diet as told by your dietitian or health care provider. Avoid foods and drinks that are high in purines, including liver, kidney, anchovies, asparagus, herring, mushrooms, mussels, and beer. ?Maintain a healthy weight or lose weight if you are overweight. If you want to lose weight, talk with your health care provider. It is important that you do not lose weight too quickly. ?Start or maintain an exercise program as told by your health care provider. ?Eating and drinking ?Drink enough fluids to keep your urine pale yellow. ?If you drink alcohol: ?Limit how much you use to: ?0-1 drink a day for women. ?0-2 drinks a day for men. ?Be aware of how much alcohol is in your drink. In the [place] one drink equals one 12 oz bottle of beer (355 mL) one 5 oz glass of wine (148 mL), or one 1? oz glass of hard liquor (44 mL). ?General instructions ?Take over-the-counter and prescription medicines only as told by your health care provider. ?Do not drive or use heavy machinery while taking prescription pain medicine. ?Return to your normal activities as told by your health care provider. Ask your health care provider what activities are safe for you. ?Keep all follow-up visits as told by your health care provider. This is important. ?Contact a health care provider if you have: ?Another gout attack. ?Continuing symptoms of a gout attack after 10 days of treatment. ?Side effects from your medicines. ?Chills or a fever. ?Burning pain when you urinate. ?Pain in your lower back or belly. ?Get help right away if you: ?Have severe or uncontrolled pain. ?Cannot urinate. ?Summary ?Gout is painful swelling of the joints caused by inflammation. ?The most common site of pain is the big toe, but it can affect other joints in the body. ?Medicines and dietary changes can help to prevent and treat gout  attacks. ?This information is not intended to replace advice given to you by your health care provider. Make sure you discuss any questions you have with your health care provider. ?Document Revised: 11/14/2017 Document Reviewed: 11/26/2017 ?Elsevier Patient Education ? 2022 Elsevier Inc. ? ?

## 2021-03-26 NOTE — Progress Notes (Signed)
[name] clinic  Provider:  Durenda Age DNP  Code Status:  DNR   Goals of Care:  Advanced Directives 12/13/2019  Does Patient Have a Medical Advance Directive? Yes  Type of Advance Directive Out of facility DNR (pink MOST or yellow form)  Does patient want to make changes to medical advance directive? No - Patient declined  Copy of [place] in Chart? Yes - validated most recent copy scanned in chart (See row information)  Pre-existing out of facility DNR order (yellow form or pink MOST form) -     Chief Complaint  Patient presents with   Acute Visit    Patient presents today for gout flare up ankle.    HPI: Patient is a 87 y.o. female seen today for an acute visit for left pinky which is swollen. She denies having had trauma to it. She has history of gout and currently taking Allopurinol 100 mg daily. 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 She has not been seen in the office for a year. Noted to have left pinky which is tender to touch and has bilateral feet 2+edema. She said that she plans to come back to [facility] as a patient. She takes Hydrodiuril 12.5 mg daily and Metoprolol succinate 25 mg daily for hypertension. She stated that she doesn't take her BP at home.   Past Medical History:  Diagnosis Date   Arthritis    oa   Chronic kidney disease    chronic kidney diseaase follow by primary md   Diabetes mellitus without complication ([place])    Diet controlled   Former smoker    H/O ventricular fibrillation 10 yrs ago dx   Hypertension    Prediabetes    Seasonal allergies    TIA (transient ischemic attack) none recent    Past Surgical History:  Procedure Laterality Date   NM MYOCAR PERF WALL MOTION  01/2008   lexiscan myoview - normal pattern of perfusion in all regions, EF  92%, no significant ischemia demonstated   TONSILLECTOMY  age 86   and adenoids   TOTAL KNEE ARTHROPLASTY Right 10/06/2015   Procedure: RIGHT TOTAL KNEE ARTHROPLASTY;  Surgeon: [name], MD;  Location: [facility];  Service: Orthopedics;  Laterality: Right;   TRANSTHORACIC ECHOCARDIOGRAM  12/2005   mild DUST, EF=>55%; mild mitral annular calcif; mild-mod aortic root calcif    No Known Allergies  Outpatient Encounter Medications as of 03/26/2021  Medication Sig   allopurinol (ZYLOPRIM) 100 MG tablet Take 1 tablet (100 mg total) by mouth daily.   cholecalciferol (VITAMIN D) 1000 UNITS tablet Take 1,000 Units by mouth daily. D3   clopidogrel (PLAVIX) 75 MG tablet TAKE 1 TABLET(75 MG) BY MOUTH DAILY   hydrochlorothiazide (HYDRODIURIL) 12.5 MG tablet Take 1 tablet (12.5 mg total) by mouth every other day.   ketorolac (ACULAR) 0.5 % ophthalmic solution Place 1 drop into the right eye 4 (four) times daily.   metoprolol succinate (TOPROL-XL) 25 MG 24 hr tablet TAKE 1 TABLET(25 MG) BY MOUTH DAILY   potassium chloride (KLOR-CON) 10 MEQ tablet TAKE 1 TABLET(10 MEQ) BY MOUTH DAILY   [DISCONTINUED] prednisoLONE acetate (PRED FORTE) 1 % ophthalmic suspension Place into the right eye.   No facility-administered encounter medications on file as of  03/26/2021.    Review of Systems:  Review of Systems  Constitutional:  Positive for activity change. Negative for appetite change and fever.       Uses scooter starting a month ago Feels unsteady a year ago and progressive  HENT:  Negative for congestion and sneezing.   Eyes: Negative.   Respiratory:  Negative for cough and shortness of breath.   Cardiovascular:  Positive for leg swelling.  Gastrointestinal:  Negative for constipation and diarrhea.  Endocrine: Negative.   Genitourinary:  Positive for urgency.  Musculoskeletal:  Positive for gait problem.       Uses a walker when walking  Skin: Negative.   Psychiatric/Behavioral: Negative.      Health Maintenance  Topic Date Due   Zoster Vaccines- Shingrix (1 of 2) Never done   DEXA SCAN  Never done   INFLUENZA VACCINE  12/18/2020   URINE MICROALBUMIN  03/14/2021   TETANUS/TDAP  07/08/2025   Pneumonia Vaccine 29+ Years old  Completed   COVID-19 Vaccine  Completed   HPV VACCINES  Aged Out    Physical Exam: Vitals:   03/26/21 1435  Weight: 135 lb 3.2 oz (61.3 kg)  Height: 5\' 7"  (1.702 m)   Body mass index is 21.18 kg/m. Physical Exam Constitutional:      Appearance: Normal appearance.  HENT:     Head: Normocephalic and atraumatic.     Right Ear: External ear normal.     Left Ear: External ear normal.     Mouth/Throat:     Mouth: Mucous membranes are dry.  Eyes:     Conjunctiva/sclera: Conjunctivae normal.  Cardiovascular:     Rate and Rhythm: Normal rate and regular rhythm.     Pulses: Normal pulses.     Heart sounds: Normal heart sounds.  Pulmonary:     Effort: Pulmonary effort is normal.     Breath sounds: Normal breath sounds.  Abdominal:     Palpations: Abdomen is soft.  Musculoskeletal:        General: Tenderness present. Normal range of motion.     Cervical back: Normal range of motion and neck supple.     Right lower leg: Edema present.     Left lower leg: Edema present.     Comments: Tender and edematous left 5th finger Has tophi on her right distal phalangeal joints Bilateral feet with 2+edema  Skin:    General: Skin is warm and dry.  Neurological:     Mental Status: She is alert and oriented to person, place, and time.  Psychiatric:        Mood and Affect: Mood normal.        Behavior: Behavior normal.    Labs reviewed: Basic Metabolic Panel: No results for input(s): NA, K, CL, CO2, GLUCOSE, BUN, CREATININE, CALCIUM, MG, PHOS, TSH in the last 8760 hours. Liver Function Tests: No results for input(s): AST, ALT, ALKPHOS, BILITOT, PROT, ALBUMIN in the last 8760 hours. No results for input(s): LIPASE, AMYLASE in the last 8760 hours. No  results for input(s): AMMONIA in the last 8760 hours. CBC: No results for input(s): WBC, NEUTROABS, HGB, HCT, MCV, PLT in the last 8760 hours. Lipid Panel: No results for input(s): CHOL, HDL, LDLCALC, TRIG, CHOLHDL, LDLDIRECT in the last 8760 hours. Lab Results  Component Value Date   HGBA1C 6.1 (H) 03/14/2020    Procedures since last visit: No results found.  Assessment/Plan  1. Need for influenza vaccination - Flu Vaccine QUAD High Dose(Fluad)  2.  Acute idiopathic gout of left hand - Uric acid - predniSONE (DELTASONE) 20 MG tablet; Take 1 tablet (20 mg total) by mouth 2 (two) times daily for 5 days.  Dispense: 10 tablet; Refill: 0 -  continue Allopurinol 100 mg daily  3. Essential hypertension -   stated that she does not take her BPs at home -   discussed importanc andce of checking her BP for her to take BP and log them and bring on her next visit -  continue Hydrodiuril 12.5 mg daily and Metoprolol succinate 25 mg daily - CMP and Liver; Future  4. Lower leg edema -  instructed to elevate feet when in bed or sitting down for a long time. -  continue Hydrodiuril 12.5 mg daily  5. Vitamin D deficiency -  continue Cholecalciferol 1,000 units daily- - Vitamin D 1,25 dihydroxy; Future  6. History of TIA (transient ischemic attack) -  stable, continue Plavix 75 mg daily - Lipid panel; Future - Hemoglobin A1C; Future - CBC with Differential/Platelets    Labs/tests ordered: CBC with differentials, CMP, Vitamin D, Lipid panel, uric acid, A1c  Next appt:  in a month

## 2021-03-27 LAB — CBC WITH DIFFERENTIAL/PLATELET
Absolute Monocytes: 510 cells/uL (ref 200–950)
Basophils Absolute: 28 cells/uL (ref 0–200)
Basophils Relative: 0.5 %
Eosinophils Absolute: 151 cells/uL (ref 15–500)
Eosinophils Relative: 2.7 %
HCT: 40.2 % (ref 35.0–45.0)
Hemoglobin: 13.5 g/dL (ref 11.7–15.5)
Lymphs Abs: 862 cells/uL (ref 850–3900)
MCH: 30.8 pg (ref 27.0–33.0)
MCHC: 33.6 g/dL (ref 32.0–36.0)
MCV: 91.8 fL (ref 80.0–100.0)
MPV: 10.7 fL (ref 7.5–12.5)
Monocytes Relative: 9.1 %
Neutro Abs: 4049 cells/uL (ref 1500–7800)
Neutrophils Relative %: 72.3 %
Platelets: 232 10*3/uL (ref 140–400)
RBC: 4.38 10*6/uL (ref 3.80–5.10)
RDW: 13.3 % (ref 11.0–15.0)
Total Lymphocyte: 15.4 %
WBC: 5.6 10*3/uL (ref 3.8–10.8)

## 2021-03-27 LAB — URIC ACID: Uric Acid, Serum: 7.2 mg/dL — ABNORMAL HIGH (ref 2.5–7.0)

## 2021-03-27 NOTE — Progress Notes (Signed)
Uric acid is elevated which is due to gout flare and will need to continue Prednisone X 5 days. She needs to continue Allopurinol 100 mg daily and get the other labs done as well. Hopefully the pain on her left pinky gets better with the Prednisone.

## 2021-03-29 ENCOUNTER — Other Ambulatory Visit: Payer: Self-pay

## 2021-03-29 DIAGNOSIS — M1A3711 Chronic gout due to renal impairment, right ankle and foot, with tophus (tophi): Secondary | ICD-10-CM

## 2021-03-29 MED ORDER — ALLOPURINOL 100 MG PO TABS: 100.0000 mg | ORAL_TABLET | Freq: Every day | ORAL | 1 refills | Status: DC

## 2021-04-02 ENCOUNTER — Other Ambulatory Visit: Payer: Self-pay

## 2021-04-02 ENCOUNTER — Ambulatory Visit: Payer: Medicare PPO | Admitting: Family

## 2021-04-02 ENCOUNTER — Ambulatory Visit (INDEPENDENT_AMBULATORY_CARE_PROVIDER_SITE_OTHER): Payer: Medicare PPO | Admitting: Family Medicine

## 2021-04-02 ENCOUNTER — Encounter: Payer: Self-pay | Admitting: Family Medicine

## 2021-04-02 VITALS — BP 130/70 | HR 76 | Temp 96.0°F | Ht 67.0 in | Wt 134.4 lb

## 2021-04-02 DIAGNOSIS — M109 Gout, unspecified: Secondary | ICD-10-CM

## 2021-04-02 DIAGNOSIS — N184 Chronic kidney disease, stage 4 (severe): Secondary | ICD-10-CM | POA: Diagnosis not present

## 2021-04-02 NOTE — Progress Notes (Signed)
Provider:  Alain Honey, MD  Careteam: Patient Care Team: Ngetich, Nelda Bucks, NP as PCP - General (Family Medicine) Debara Pickett Nadean Corwin, MD as Consulting Physician (Cardiology) [name], MD as Consulting Physician (Orthopedic Surgery) Hortencia Pilar, MD as Consulting Physician (Surgery)  PLACE OF SERVICE:  [facility] Directive information    No Known Allergies  Chief Complaint  Patient presents with   Medical Management of Chronic Issues    Patient presents today for gout follow-up and reports feeling better.     HPI: Patient is a 87 y.o. female patient returns today to follow-up gout.  Since she has taken prednisone symptoms have resolved.  She continues to take allopurinol through acute phase of gout.  We spent most of our visit talking about use of allopurinol which she really does not want to take because it makes her sick.  She had formally taken colchicine once or twice a day.  Gouty attacks are not frequent and she would like to discontinue use of allopurinol review of uric acid levels show high of 8 within recent years.  Review of Systems:  Review of Systems  Musculoskeletal:  Positive for joint pain.   Past Medical History:  Diagnosis Date   Arthritis    oa   Chronic kidney disease    chronic kidney diseaase follow by primary md   Diabetes mellitus without complication ([place])    Diet controlled   Former smoker    H/O ventricular fibrillation 10 yrs ago dx   Hypertension    Prediabetes    Seasonal allergies    TIA (transient ischemic attack) none recent   Past Surgical History:  Procedure Laterality Date   NM MYOCAR PERF WALL MOTION  01/2008   lexiscan myoview - normal pattern of perfusion in all regions, EF 92%, no significant ischemia demonstated   TONSILLECTOMY  age 57   and adenoids   TOTAL KNEE ARTHROPLASTY Right 10/06/2015   Procedure: RIGHT TOTAL KNEE ARTHROPLASTY;  Surgeon: [name], MD;  Location: [facility]  [facility];  Service: Orthopedics;  Laterality: Right;   TRANSTHORACIC ECHOCARDIOGRAM  12/2005   mild DUST, EF=>55%; mild mitral annular calcif; mild-mod aortic root calcif   Social History:   reports that she quit smoking about 20 years ago. Her smoking use included cigarettes. She has a 10.00 pack-year smoking history. She has never used smokeless tobacco. She reports current alcohol use of about 1.0 standard drink per week. She reports that she does not use drugs.  Family History  Problem Relation Age of Onset   Congestive Heart Failure Mother    Alcohol abuse Father    Heart disease Son    Bipolar disorder Son    Lung cancer Brother    Post-traumatic stress disorder Daughter     Medications: Patient's Medications  New Prescriptions   No medications on file  Previous Medications   ALLOPURINOL (ZYLOPRIM) 100 MG TABLET    Take 1 tablet (100 mg total) by mouth daily.   CHOLECALCIFEROL (VITAMIN D) 1000 UNITS TABLET    Take 1,000 Units by mouth daily. D3   CLOPIDOGREL (PLAVIX) 75 MG TABLET    TAKE 1 TABLET(75 MG) BY MOUTH DAILY   HYDROCHLOROTHIAZIDE (HYDRODIURIL) 12.5 MG TABLET    Take 1 tablet (12.5 mg total) by mouth every other day.   KETOROLAC (ACULAR) 0.5 % OPHTHALMIC SOLUTION    Place 1 drop into the right eye 4 (four) times daily.   METOPROLOL SUCCINATE (TOPROL-XL) 25  MG 24 HR TABLET    TAKE 1 TABLET(25 MG) BY MOUTH DAILY   POTASSIUM CHLORIDE (KLOR-CON) 10 MEQ TABLET    TAKE 1 TABLET(10 MEQ) BY MOUTH DAILY  Modified Medications   No medications on file  Discontinued Medications   No medications on file    Physical Exam:  Vitals:   04/02/21 1029  BP: 130/70  Pulse: 76  Temp: (!) 96 F (35.6 C)  SpO2: 98%  Weight: 134 lb 6.4 oz (61 kg)  Height: 5\' 7"  (1.702 m)   Body mass index is 21.05 kg/m. Wt Readings from Last 3 Encounters:  04/02/21 134 lb 6.4 oz (61 kg)  03/26/21 135 lb 3.2 oz (61.3 kg)  03/16/20 126 lb 12.8 oz (57.5 kg)    Physical Exam Vitals and nursing  note reviewed.  Constitutional:      Appearance: Normal appearance.  Musculoskeletal:     Comments: There are tophi on several of her fingers.  Her left fifth finger has some peeling skin where the acute attack had been.  Neurological:     Mental Status: She is alert.    Labs reviewed: Basic Metabolic Panel: No results for input(s): NA, K, CL, CO2, GLUCOSE, BUN, CREATININE, CALCIUM, MG, PHOS, TSH in the last 8760 hours. Liver Function Tests: No results for input(s): AST, ALT, ALKPHOS, BILITOT, PROT, ALBUMIN in the last 8760 hours. No results for input(s): LIPASE, AMYLASE in the last 8760 hours. No results for input(s): AMMONIA in the last 8760 hours. CBC: Recent Labs    03/26/21 1539  WBC 5.6  NEUTROABS 4,049  HGB 13.5  HCT 40.2  MCV 91.8  PLT 232   Lipid Panel: No results for input(s): CHOL, HDL, LDLCALC, TRIG, CHOLHDL, LDLDIRECT in the last 8760 hours. TSH: No results for input(s): TSH in the last 8760 hours. A1C: Lab Results  Component Value Date   HGBA1C 6.1 (H) 03/14/2020     Assessment/Plan  1. Gout of multiple sites, unspecified cause, unspecified chronicity Discontinue allopurinol at this time.  Patient is fully aware of symptoms and will call if gout flare happens  2. CKD (chronic kidney disease) stage 4, GFR 15-29 ml/min (HCC) We reviewed her labs from recent visits. GFR and creatinine have stabled recently.  Continue to maintain good hydration and avoid NSAID   Alain Honey, MD [place] [phone]

## 2021-04-02 NOTE — Patient Instructions (Signed)
Hold Allopurinol for now

## 2021-04-18 ENCOUNTER — Ambulatory Visit (INDEPENDENT_AMBULATORY_CARE_PROVIDER_SITE_OTHER): Payer: Self-pay | Admitting: Podiatry

## 2021-04-18 DIAGNOSIS — Z91199 Patient's noncompliance with other medical treatment and regimen due to unspecified reason: Secondary | ICD-10-CM

## 2021-04-19 NOTE — Progress Notes (Signed)
   Complete physical exam  Patient: Mia [name]   DOB: 03/09/1997   87 y.o. Female  MRN: [id number]  Subjective:    No chief complaint on file.   Mia [name] is a 87 y.o. female who presents today for a complete physical exam. She reports consuming a {diet types:17450} diet. {types:19826} She generally feels {DESC; WELL/FAIRLY WELL/POORLY:18703}. She reports sleeping {DESC; WELL/FAIRLY WELL/POORLY:18703}. She {does/does not:200015} have additional problems to discuss today.    Most recent fall risk assessment:    11/14/2021   10:42 AM  Fall Risk   Falls in the past year? 0  Number falls in past yr: 0  Injury with Fall? 0  Risk for fall due to : No Fall Risks  Follow up Falls evaluation completed     Most recent depression screenings:    11/14/2021   10:42 AM 10/05/2020   10:46 AM  PHQ 2/9 Scores  PHQ - 2 Score 0 0  PHQ- 9 Score 5     {VISON DENTAL STD PSA (Optional):27386}  {History (Optional):23778}  Patient Care Team: Jessup, Joy, NP as PCP - General (Nurse Practitioner)   Outpatient Medications Prior to Visit  Medication Sig   fluticasone (FLONASE) 50 MCG/ACT nasal spray Place 2 sprays into both nostrils in the morning and at bedtime. After 7 days, reduce to once daily.   norgestimate-ethinyl estradiol (SPRINTEC 28) 0.25-35 MG-MCG tablet Take 1 tablet by mouth daily.   Nystatin POWD Apply liberally to affected area 2 times per day   spironolactone (ALDACTONE) 100 MG tablet Take 1 tablet (100 mg total) by mouth daily.   No facility-administered medications prior to visit.    ROS        Objective:     There were no vitals taken for this visit. {Vitals History (Optional):23777}  Physical Exam   No results found for any visits on 12/20/21. {Show previous labs (optional):23779}    Assessment & Plan:    Routine Health Maintenance and Physical Exam  Immunization History  Administered Date(s) Administered   DTaP 05/23/1999, 07/19/1999,  09/27/1999, 06/12/2000, 12/27/2003   Hepatitis A 10/23/2007, 10/28/2008   Hepatitis B 03/10/1999, 04/17/1999, 09/27/1999   HiB (PRP-OMP) 05/23/1999, 07/19/1999, 09/27/1999, 06/12/2000   IPV 05/23/1999, 07/19/1999, 03/17/2000, 12/27/2003   Influenza,inj,Quad PF,6+ Mos 01/28/2014   Influenza-Unspecified 04/29/2012   MMR 03/17/2001, 12/27/2003   Meningococcal Polysaccharide 10/28/2011   Pneumococcal Conjugate-13 06/12/2000   Pneumococcal-Unspecified 09/27/1999, 12/11/1999   Tdap 10/28/2011   Varicella 03/17/2000, 10/23/2007    Health Maintenance  Topic Date Due   HIV Screening  Never done   Hepatitis C Screening  Never done   INFLUENZA VACCINE  12/18/2021   PAP-Cervical Cytology Screening  12/20/2021 (Originally 03/08/2020)   PAP SMEAR-Modifier  12/20/2021 (Originally 03/08/2020)   TETANUS/TDAP  12/20/2021 (Originally 10/27/2021)   HPV VACCINES  Discontinued   COVID-19 Vaccine  Discontinued    Discussed health benefits of physical activity, and encouraged her to engage in regular exercise appropriate for her age and condition.  Problem List Items Addressed This Visit   None Visit Diagnoses     Annual physical exam    -  Primary   Cervical cancer screening       Need for Tdap vaccination          No follow-ups on file.     Joy Jessup, NP   

## 2021-04-26 ENCOUNTER — Encounter (INDEPENDENT_AMBULATORY_CARE_PROVIDER_SITE_OTHER): Payer: Self-pay

## 2021-04-26 ENCOUNTER — Other Ambulatory Visit (INDEPENDENT_AMBULATORY_CARE_PROVIDER_SITE_OTHER): Payer: Self-pay | Admitting: Ophthalmology

## 2021-05-30 ENCOUNTER — Encounter (INDEPENDENT_AMBULATORY_CARE_PROVIDER_SITE_OTHER): Payer: Medicare PPO | Admitting: Ophthalmology

## 2021-05-31 ENCOUNTER — Encounter (INDEPENDENT_AMBULATORY_CARE_PROVIDER_SITE_OTHER): Payer: Medicare PPO | Admitting: Ophthalmology

## 2021-06-07 ENCOUNTER — Emergency Department (HOSPITAL_COMMUNITY): Payer: No Typology Code available for payment source

## 2021-06-07 ENCOUNTER — Inpatient Hospital Stay (HOSPITAL_COMMUNITY)
Admission: EM | Admit: 2021-06-07 | Discharge: 2021-06-12 | DRG: 065 | Disposition: A | Discharge status: Skilled Nursing Facility | Payer: No Typology Code available for payment source | Attending: Internal Medicine | Admitting: Internal Medicine

## 2021-06-07 ENCOUNTER — Encounter (HOSPITAL_COMMUNITY): Payer: Self-pay

## 2021-06-07 DIAGNOSIS — E11311 Type 2 diabetes mellitus with unspecified diabetic retinopathy with macular edema: Secondary | ICD-10-CM | POA: Diagnosis present

## 2021-06-07 DIAGNOSIS — G3184 Mild cognitive impairment, so stated: Secondary | ICD-10-CM | POA: Diagnosis present

## 2021-06-07 DIAGNOSIS — R600 Localized edema: Secondary | ICD-10-CM | POA: Diagnosis present

## 2021-06-07 DIAGNOSIS — Z8249 Family history of ischemic heart disease and other diseases of the circulatory system: Secondary | ICD-10-CM

## 2021-06-07 DIAGNOSIS — S80821A Blister (nonthermal), right lower leg, initial encounter: Secondary | ICD-10-CM | POA: Diagnosis present

## 2021-06-07 DIAGNOSIS — I672 Cerebral atherosclerosis: Secondary | ICD-10-CM | POA: Diagnosis present

## 2021-06-07 DIAGNOSIS — E876 Hypokalemia: Secondary | ICD-10-CM | POA: Diagnosis present

## 2021-06-07 DIAGNOSIS — Z96651 Presence of right artificial knee joint: Secondary | ICD-10-CM | POA: Diagnosis present

## 2021-06-07 DIAGNOSIS — Z79899 Other long term (current) drug therapy: Secondary | ICD-10-CM

## 2021-06-07 DIAGNOSIS — M109 Gout, unspecified: Secondary | ICD-10-CM | POA: Diagnosis present

## 2021-06-07 DIAGNOSIS — M7989 Other specified soft tissue disorders: Secondary | ICD-10-CM | POA: Diagnosis present

## 2021-06-07 DIAGNOSIS — E1122 Type 2 diabetes mellitus with diabetic chronic kidney disease: Secondary | ICD-10-CM | POA: Diagnosis present

## 2021-06-07 DIAGNOSIS — R413 Other amnesia: Secondary | ICD-10-CM | POA: Diagnosis present

## 2021-06-07 DIAGNOSIS — Z801 Family history of malignant neoplasm of trachea, bronchus and lung: Secondary | ICD-10-CM

## 2021-06-07 DIAGNOSIS — E119 Type 2 diabetes mellitus without complications: Secondary | ICD-10-CM

## 2021-06-07 DIAGNOSIS — R2689 Other abnormalities of gait and mobility: Secondary | ICD-10-CM | POA: Diagnosis present

## 2021-06-07 DIAGNOSIS — I639 Cerebral infarction, unspecified: Secondary | ICD-10-CM

## 2021-06-07 DIAGNOSIS — I480 Paroxysmal atrial fibrillation: Secondary | ICD-10-CM | POA: Diagnosis present

## 2021-06-07 DIAGNOSIS — R4701 Aphasia: Secondary | ICD-10-CM | POA: Diagnosis present

## 2021-06-07 DIAGNOSIS — I129 Hypertensive chronic kidney disease with stage 1 through stage 4 chronic kidney disease, or unspecified chronic kidney disease: Secondary | ICD-10-CM | POA: Diagnosis present

## 2021-06-07 DIAGNOSIS — Z20822 Contact with and (suspected) exposure to covid-19: Secondary | ICD-10-CM | POA: Diagnosis present

## 2021-06-07 DIAGNOSIS — Z87891 Personal history of nicotine dependence: Secondary | ICD-10-CM

## 2021-06-07 DIAGNOSIS — I6381 Other cerebral infarction due to occlusion or stenosis of small artery: Secondary | ICD-10-CM | POA: Diagnosis not present

## 2021-06-07 DIAGNOSIS — N184 Chronic kidney disease, stage 4 (severe): Secondary | ICD-10-CM | POA: Diagnosis present

## 2021-06-07 DIAGNOSIS — Z8673 Personal history of transient ischemic attack (TIA), and cerebral infarction without residual deficits: Secondary | ICD-10-CM

## 2021-06-07 DIAGNOSIS — M1990 Unspecified osteoarthritis, unspecified site: Secondary | ICD-10-CM | POA: Diagnosis present

## 2021-06-07 DIAGNOSIS — Z7902 Long term (current) use of antithrombotics/antiplatelets: Secondary | ICD-10-CM

## 2021-06-07 DIAGNOSIS — R531 Weakness: Secondary | ICD-10-CM | POA: Diagnosis present

## 2021-06-07 DIAGNOSIS — I10 Essential (primary) hypertension: Secondary | ICD-10-CM | POA: Diagnosis present

## 2021-06-07 DIAGNOSIS — Z66 Do not resuscitate: Secondary | ICD-10-CM | POA: Diagnosis not present

## 2021-06-07 DIAGNOSIS — H510 Palsy (spasm) of conjugate gaze: Secondary | ICD-10-CM | POA: Diagnosis present

## 2021-06-07 DIAGNOSIS — R1313 Dysphagia, pharyngeal phase: Secondary | ICD-10-CM | POA: Diagnosis present

## 2021-06-07 DIAGNOSIS — T148XXA Other injury of unspecified body region, initial encounter: Secondary | ICD-10-CM | POA: Diagnosis not present

## 2021-06-07 LAB — BASIC METABOLIC PANEL
Anion gap: 11 (ref 5–15)
BUN: 26 mg/dL — ABNORMAL HIGH (ref 8–23)
CO2: 25 mmol/L (ref 22–32)
Calcium: 9.7 mg/dL (ref 8.9–10.3)
Chloride: 109 mmol/L (ref 98–111)
Creatinine, Ser: 1.69 mg/dL — ABNORMAL HIGH (ref 0.44–1.00)
GFR, Estimated: 28 mL/min — ABNORMAL LOW (ref >60)
Glucose, Bld: 131 mg/dL — ABNORMAL HIGH (ref 70–99)
Potassium: 3.9 mmol/L (ref 3.5–5.1)
Sodium: 145 mmol/L (ref 135–145)

## 2021-06-07 LAB — CBC WITH DIFFERENTIAL/PLATELET
Abs Immature Granulocytes: 0.01 10*3/uL (ref 0.00–0.07)
Basophils Absolute: 0 10*3/uL (ref 0.0–0.1)
Basophils Relative: 0 %
Eosinophils Absolute: 0 10*3/uL (ref 0.0–0.5)
Eosinophils Relative: 0 %
HCT: 40 % (ref 36.0–46.0)
Hemoglobin: 13 g/dL (ref 12.0–15.0)
Immature Granulocytes: 0 %
Lymphocytes Relative: 10 %
Lymphs Abs: 0.5 10*3/uL — ABNORMAL LOW (ref 0.7–4.0)
MCH: 30.3 pg (ref 26.0–34.0)
MCHC: 32.5 g/dL (ref 30.0–36.0)
MCV: 93.2 fL (ref 80.0–100.0)
Monocytes Absolute: 0.3 10*3/uL (ref 0.1–1.0)
Monocytes Relative: 5 %
Neutro Abs: 4.6 10*3/uL (ref 1.7–7.7)
Neutrophils Relative %: 85 %
Platelets: 217 10*3/uL (ref 150–400)
RBC: 4.29 MIL/uL (ref 3.87–5.11)
RDW: 15.5 % (ref 11.5–15.5)
WBC: 5.4 10*3/uL (ref 4.0–10.5)
nRBC: 0 % (ref 0.0–0.2)

## 2021-06-07 LAB — BRAIN NATRIURETIC PEPTIDE: B Natriuretic Peptide: 335.7 pg/mL — ABNORMAL HIGH (ref 0.0–100.0)

## 2021-06-07 LAB — URINALYSIS, ROUTINE W REFLEX MICROSCOPIC
Bilirubin Urine: NEGATIVE
Glucose, UA: NEGATIVE mg/dL
Hgb urine dipstick: NEGATIVE
Ketones, ur: NEGATIVE mg/dL
Leukocytes,Ua: NEGATIVE
Nitrite: NEGATIVE
Protein, ur: NEGATIVE mg/dL
Specific Gravity, Urine: 1.02 (ref 1.005–1.030)
pH: 6 (ref 5.0–8.0)

## 2021-06-07 LAB — PROTIME-INR
INR: 1 (ref 0.8–1.2)
Prothrombin Time: 13 seconds (ref 11.4–15.2)

## 2021-06-07 LAB — GLUCOSE, CAPILLARY: Glucose-Capillary: 92 mg/dL (ref 70–99)

## 2021-06-07 LAB — RESP PANEL BY RT-PCR (FLU A&B, COVID) ARPGX2
Influenza A by PCR: NEGATIVE
Influenza B by PCR: NEGATIVE
SARS Coronavirus 2 by RT PCR: NEGATIVE

## 2021-06-07 IMAGING — CT CT HEAD W/O CM
4 series · 17 of 47 positions shown, 19 images · non-contrast
Comparison: Hypo CT dated [DATE]

CLINICAL DATA: Left eye vision problems



[Series 3: head bone · axial · 0.41mm/px · z∈[-110,-58]mm · 4 of 75 slices shown]
[im 8/75  bone]
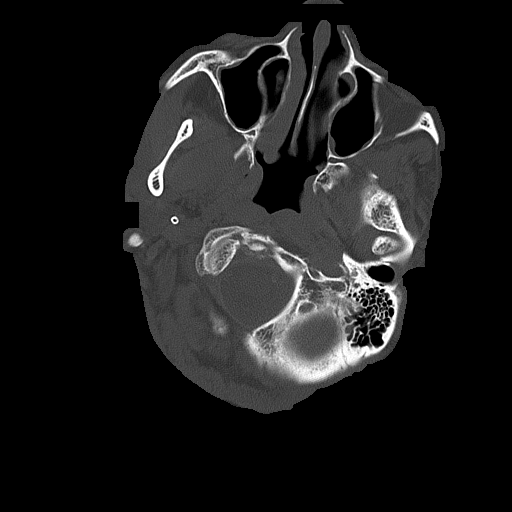
[im 15/75  bone]
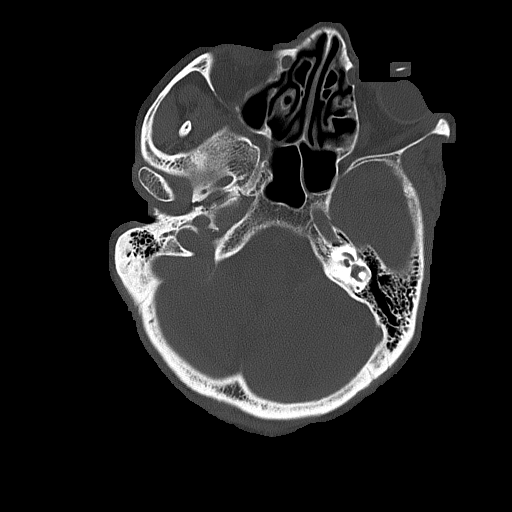
[im 23/75  bone]
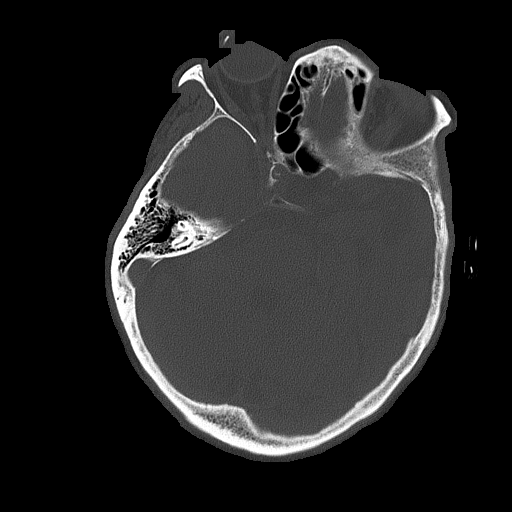
[im 34/75  bone]
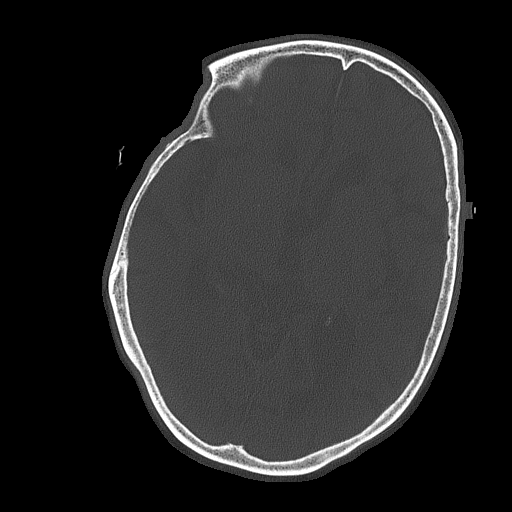

[Series 4: head without · axial · non-contrast · 0.41mm/px · z∈[-109,+1]mm · 7 of 30 slices shown, 9 images]
[im 4/30  brain]
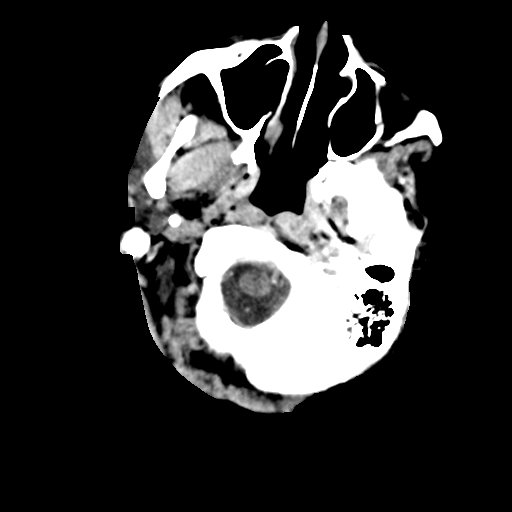
[im 4/30  bone]
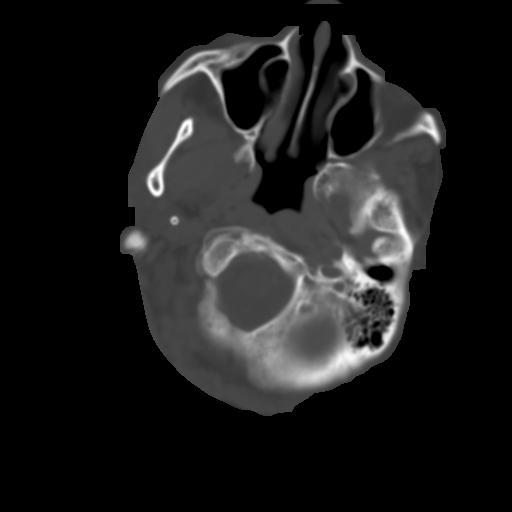
[im 8/30  brain]
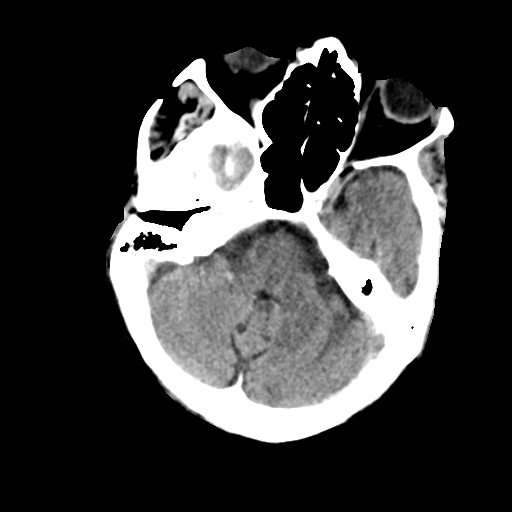
[im 11/30  brain]
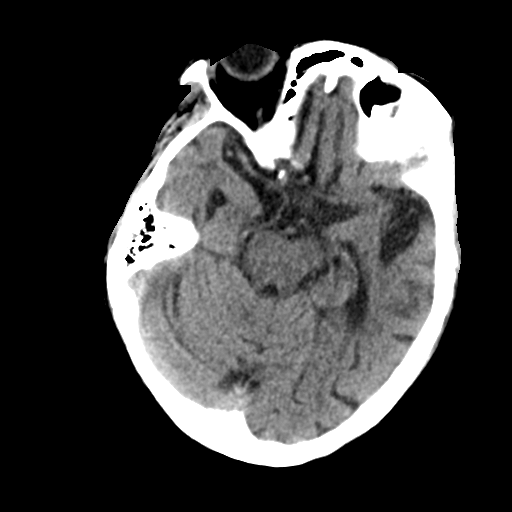
[im 15/30  brain]
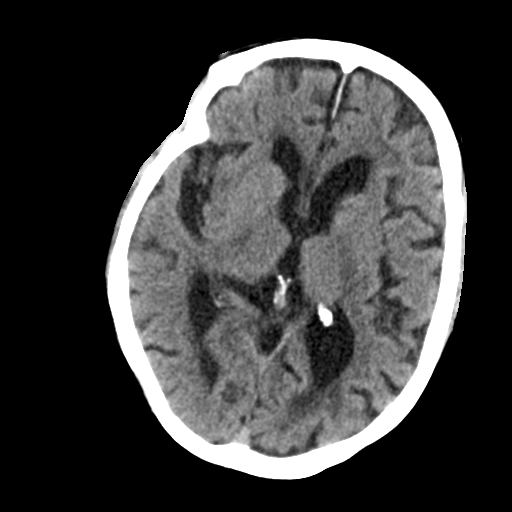
[im 19/30  brain]
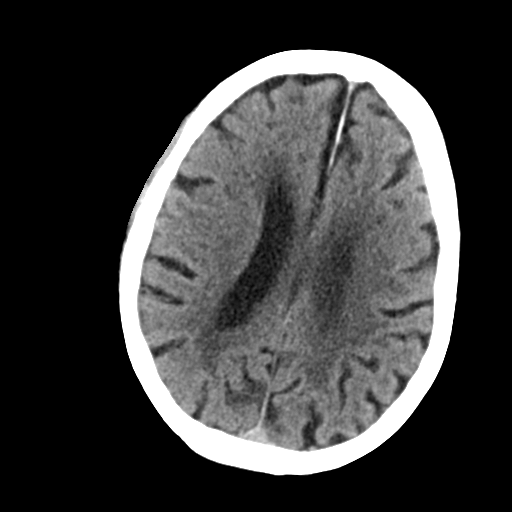
[im 19/30  bone]
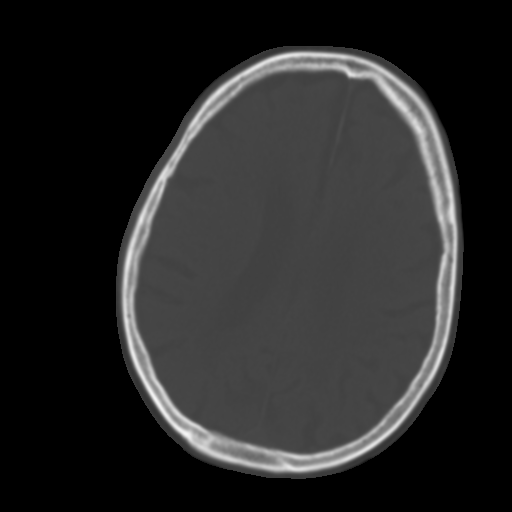
[im 22/30  brain]
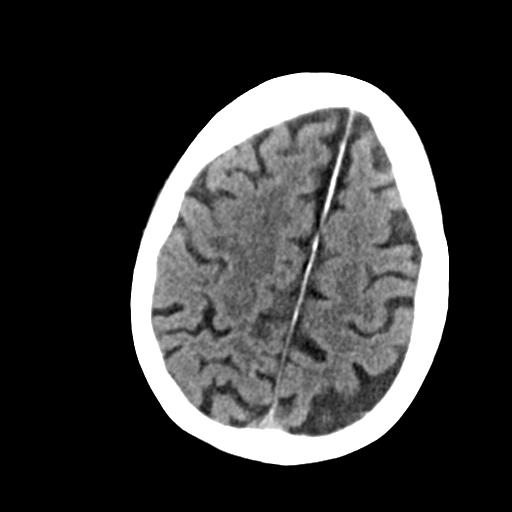
[im 26/30  brain]
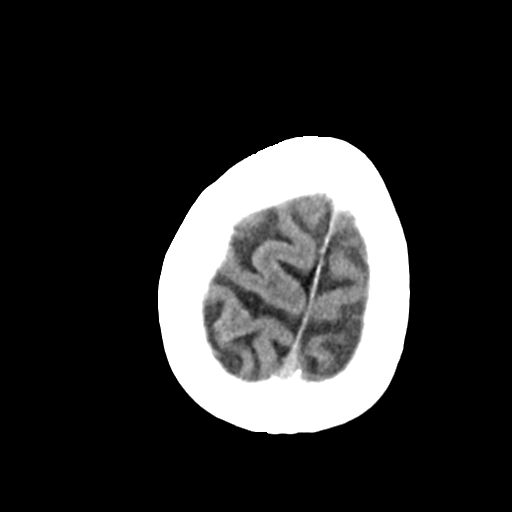

[Series 5: head without cor · coronal · non-contrast · 0.31mm/px · 3 of 67 slices shown]
[im 23/67  brain]
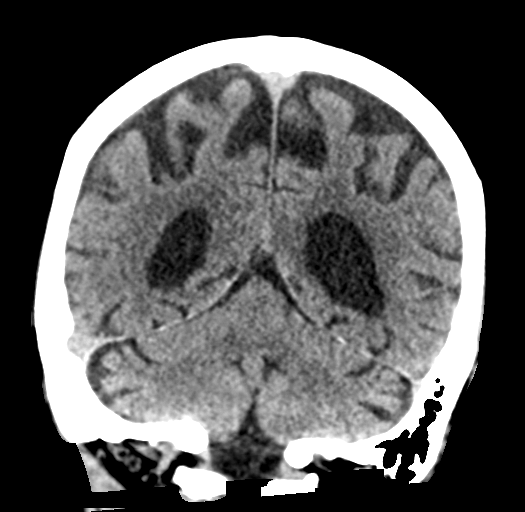
[im 30/67  brain]
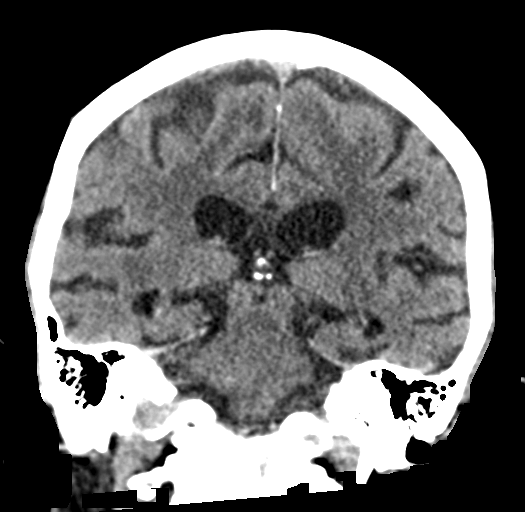
[im 37/67  brain]
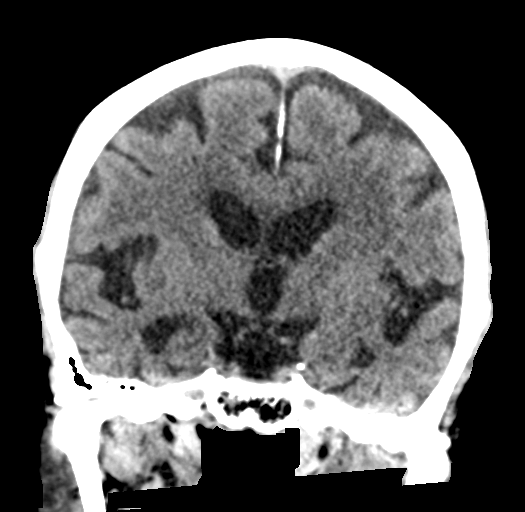

[Series 6: head without sag · sagittal · non-contrast · 0.30mm/px · 3 of 55 slices shown]
[im 19/55  brain]
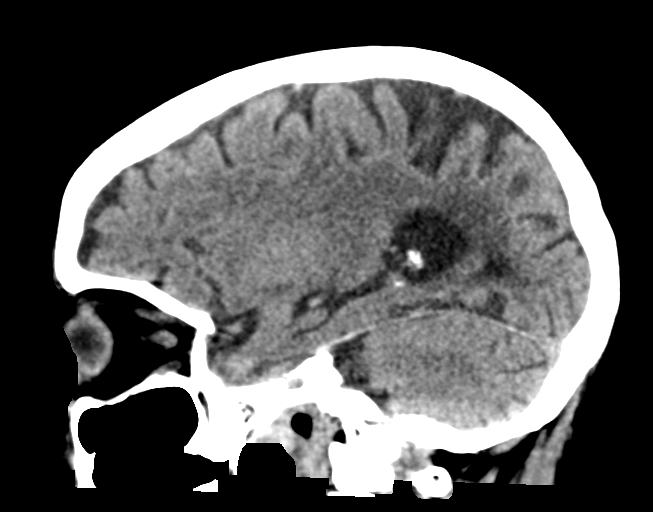
[im 28/55  brain]
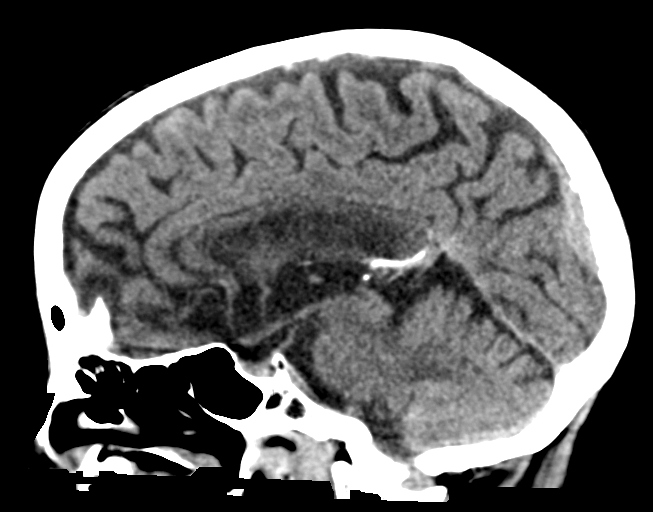
[im 37/55  brain]
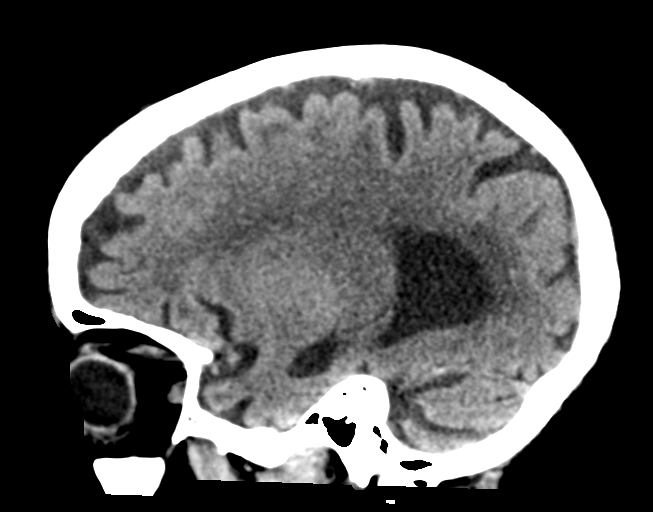

[17 of 47 positions shown; findings below may reference images not displayed]

FINDINGS: Brain: Chronic white matter ischemic change. No evidence of acute
infarction, hemorrhage, hydrocephalus, extra-axial collection or
mass lesion/mass effect.

Vascular: No hyperdense vessel or unexpected calcification.

Skull: Normal. Negative for fracture or focal lesion.

Sinuses/Orbits: No acute finding.

Other: None.
IMPRESSION: No acute intracranial abnormality.

## 2021-06-07 IMAGING — DX DG CHEST 2V
2 series · 2 of 2 positions shown · non-contrast
Comparison: [DATE].

CLINICAL DATA: Shortness of breath with lower extremity edema.

EXAM:
CHEST - 2 VIEW

[chest lat]
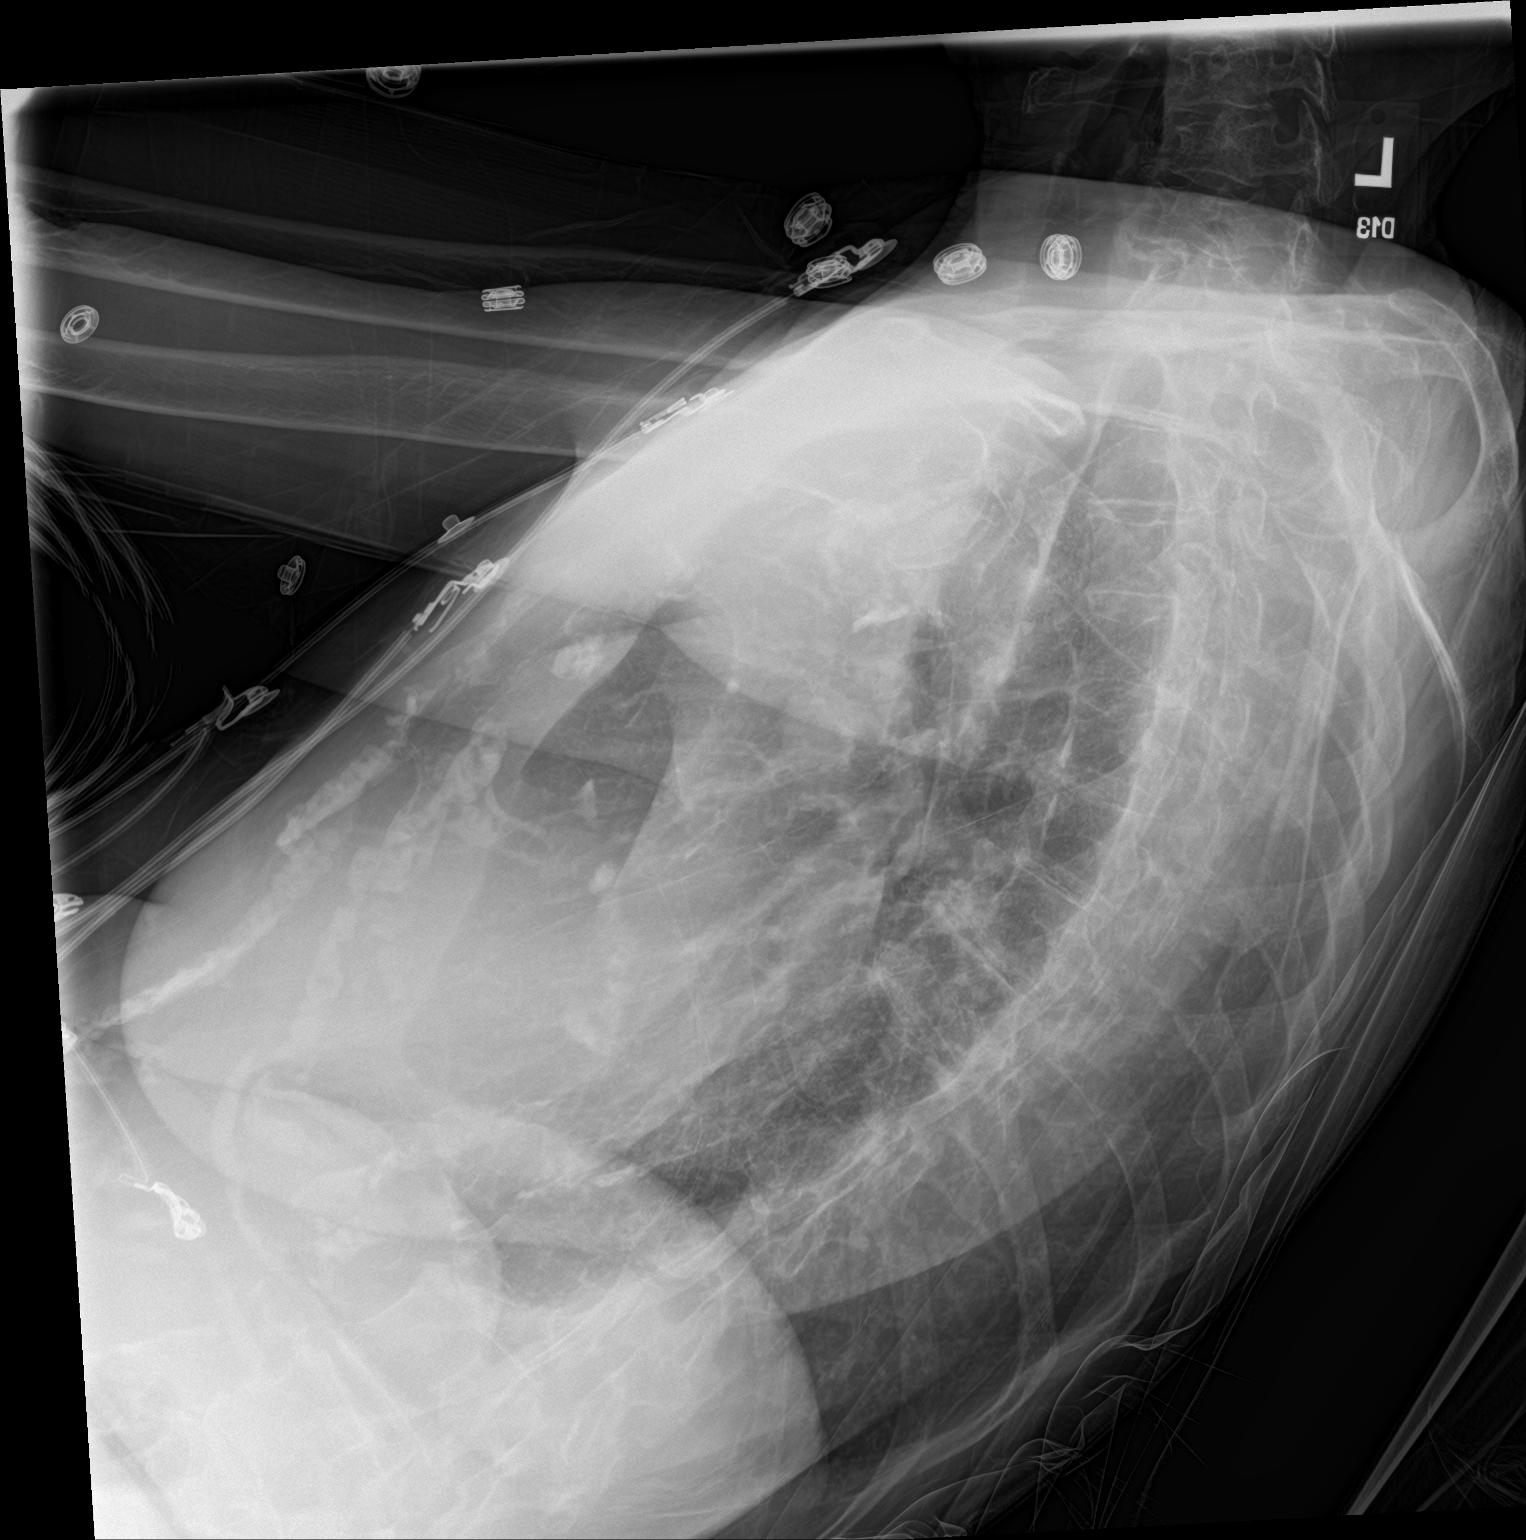

[chest ap]
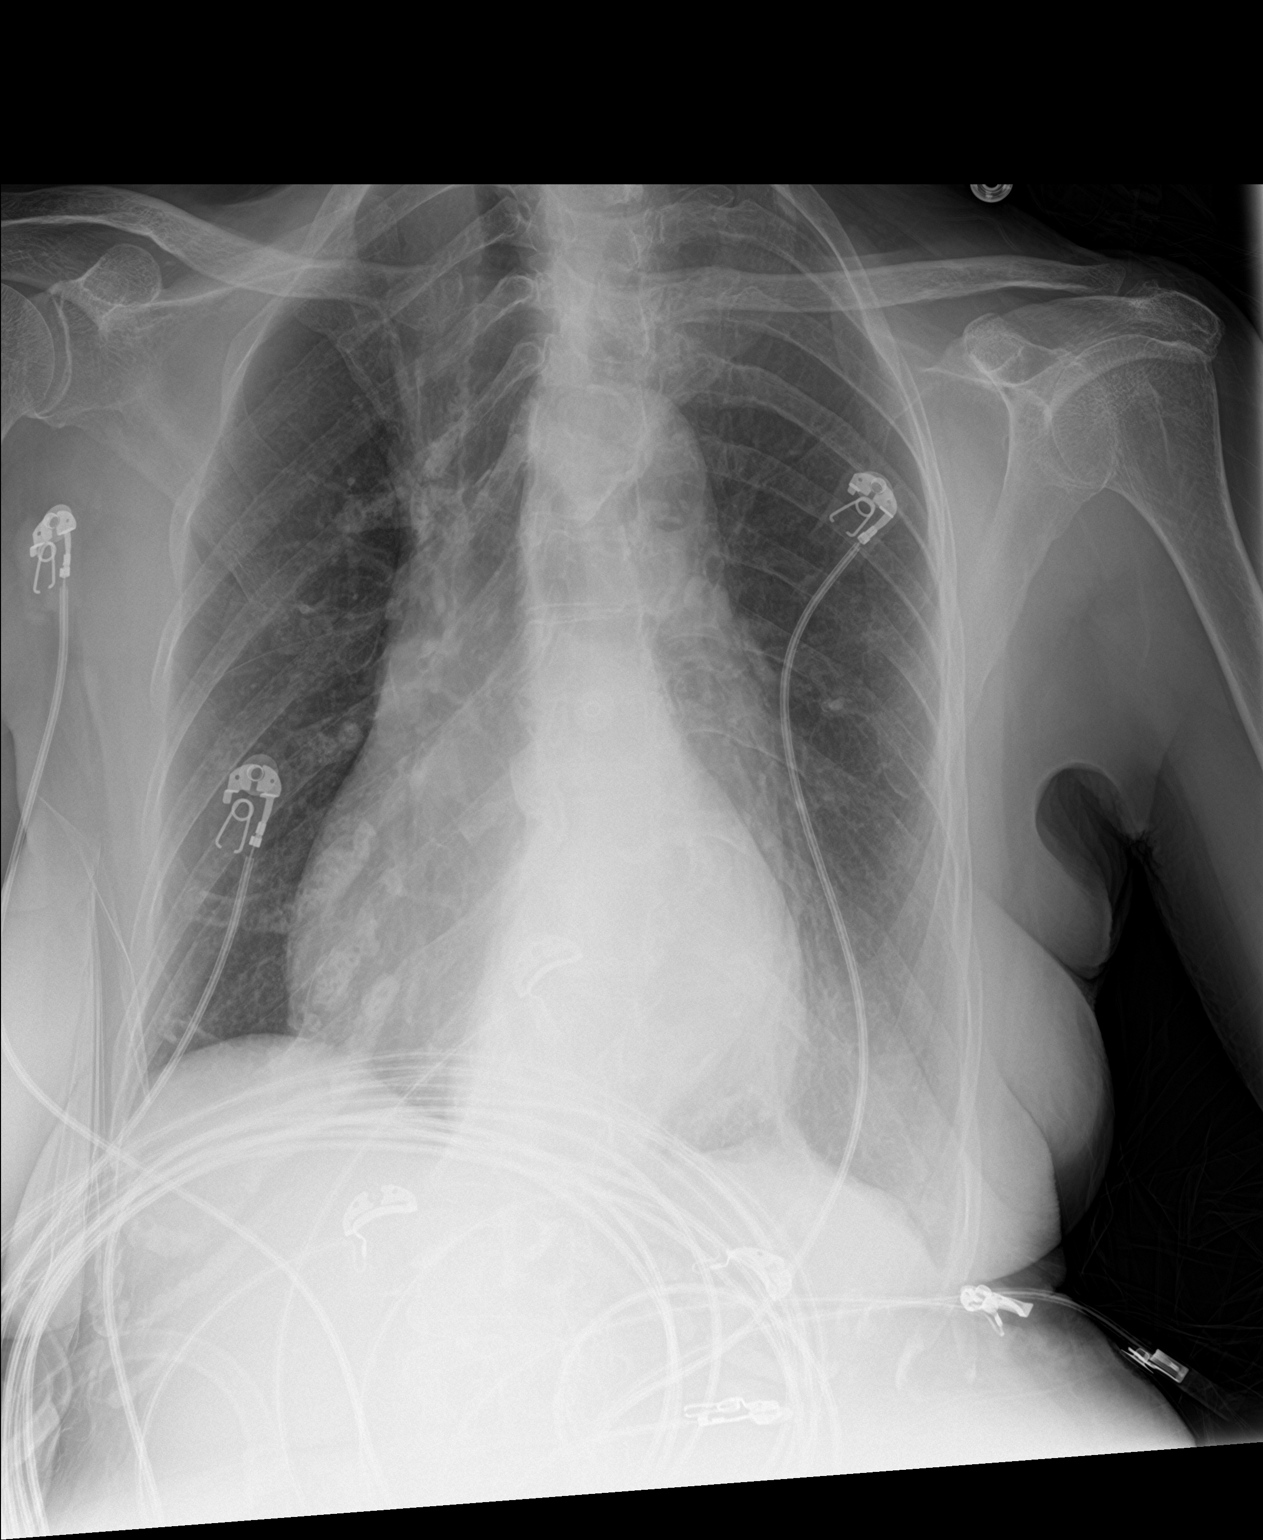

[2 of 2 positions shown; findings below may reference images not displayed]

FINDINGS: Lungs are hyperexpanded. The lungs are clear without focal
pneumonia, edema, pneumothorax or pleural effusion. The cardio
pericardial silhouette is enlarged. Bones are diffusely
demineralized. Telemetry leads overlie the chest.
IMPRESSION: Hyperexpansion without acute cardiopulmonary findings.

## 2021-06-07 IMAGING — MR MR HEAD W/O CM
9 of 10 series · 36 of 48 positions shown · non-contrast
Comparison: Head CT [DATE]. Report from brain MRI [DATE]
(images unavailable).

CLINICAL DATA: Provided history: Neuro deficit, acute, stroke
suspected. Additional history provided: Progressively worsening
blurry vision, generalized weakness, left-sided gaze noted.

EXAM:
MRI HEAD WITHOUT CONTRAST
TECHNIQUE: Multiplanar, multiecho pulse sequences of the brain and surrounding
structures were obtained without intravenous contrast.

[Series 3: DWI · axial · 3.0mm · 1.09mm/px · z∈[-19,+104]mm · 9 of 88 slices shown (1 of 4)]
[im 1/88]
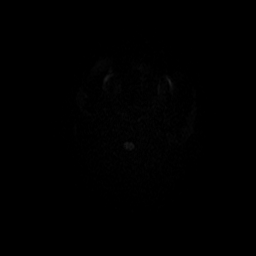
[im 11/88]
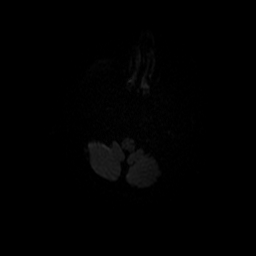
[im 22/88]
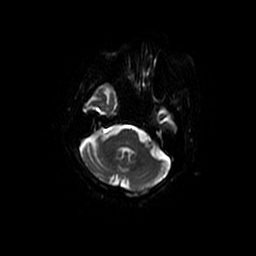
[im 33/88]
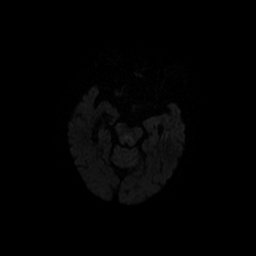
[im 44/88]
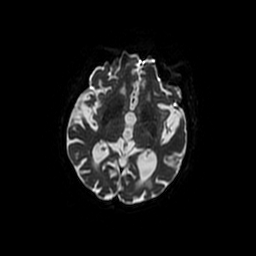
[im 55/88]
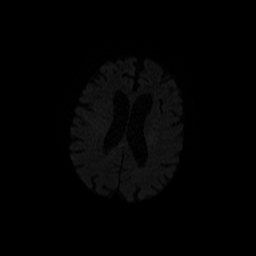
[im 66/88]
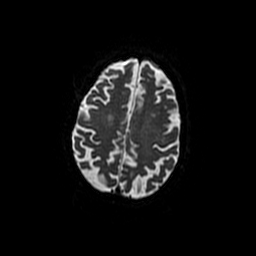
[im 77/88]
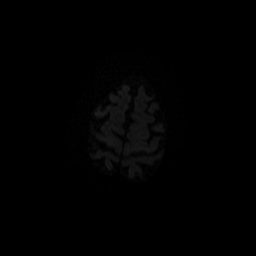
[im 88/88]
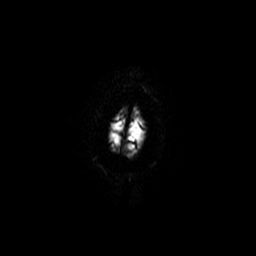

[Series 4: DWI · coronal · 5.0mm · 1.09mm/px · 7 of 68 slices shown (2 of 4)]
[im 1/68]
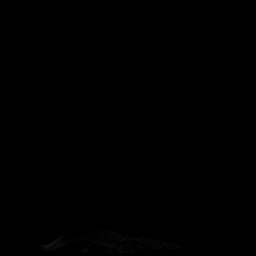
[im 12/68]
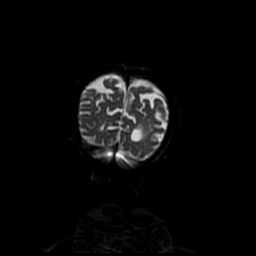
[im 23/68]
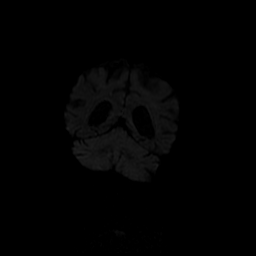
[im 34/68]
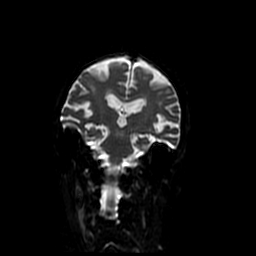
[im 45/68]
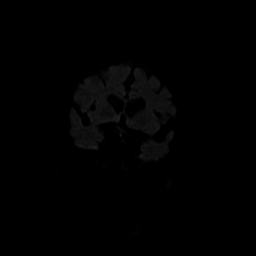
[im 56/68]
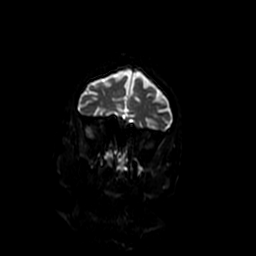
[im 68/68]
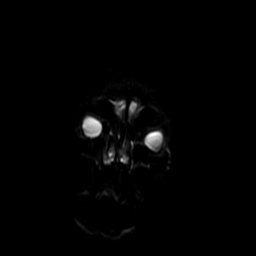

[Series 5: T1 · sagittal · 5.0mm · 0.47mm/px · 2 of 23 slices shown (1 of 2)]
[im 1/23]
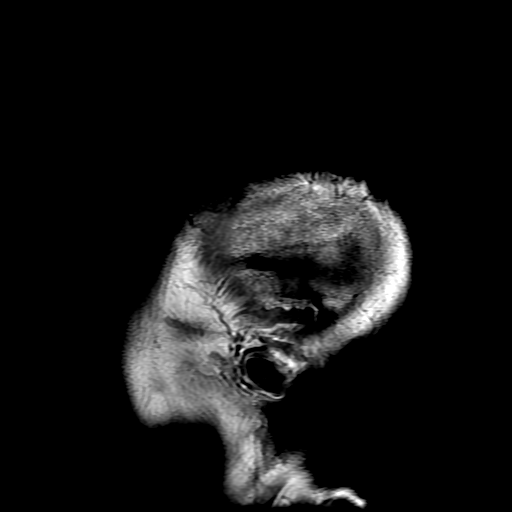
[im 23/23]
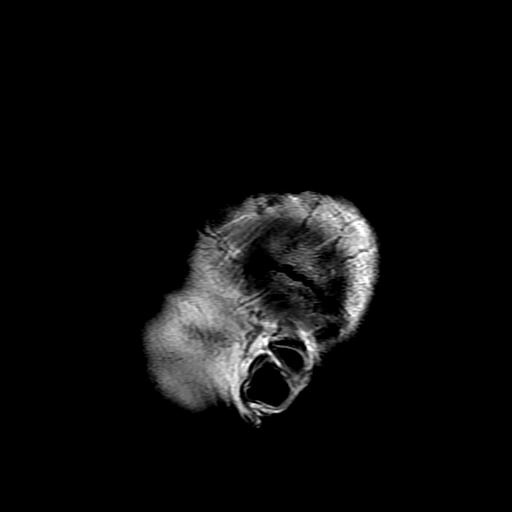

[Series 6: T2 · axial · 5.0mm · 0.43mm/px · z∈[-52,+82]mm · 3 of 24 slices shown (1 of 2)]
[im 1/24]
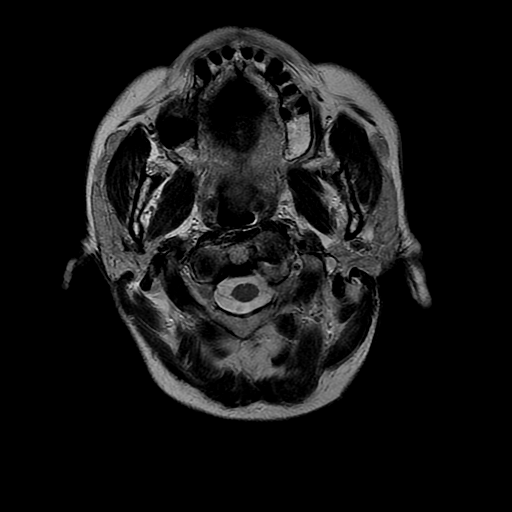
[im 12/24]
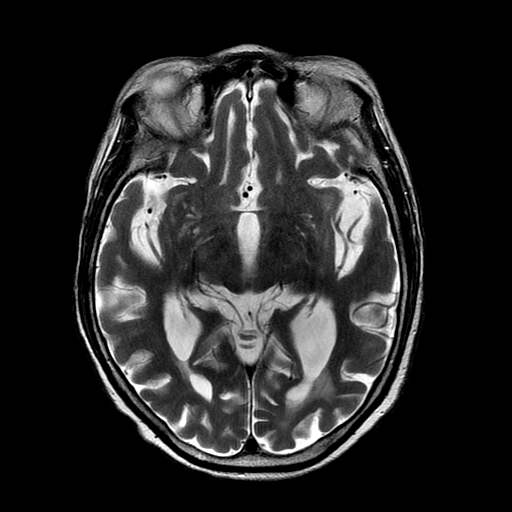
[im 24/24]
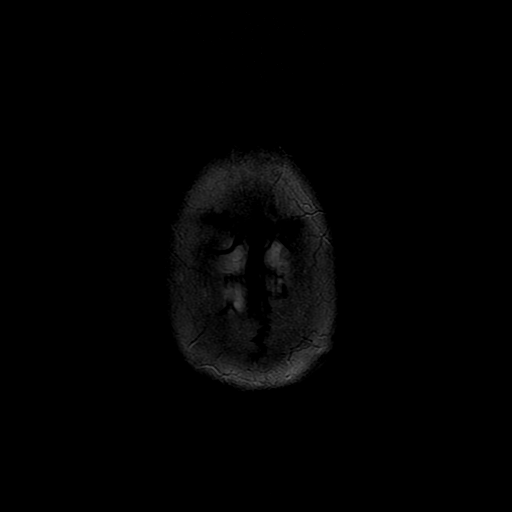

[Series 7: FLAIR · axial · 3.0mm · 0.43mm/px · z∈[-43,+80]mm · 2 of 22 slices shown]
[im 1/22]
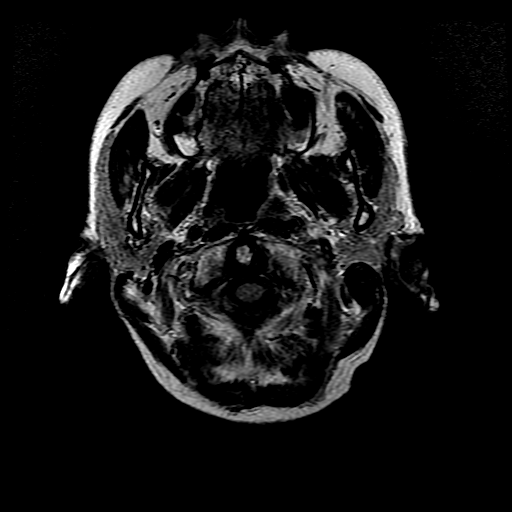
[im 22/22]
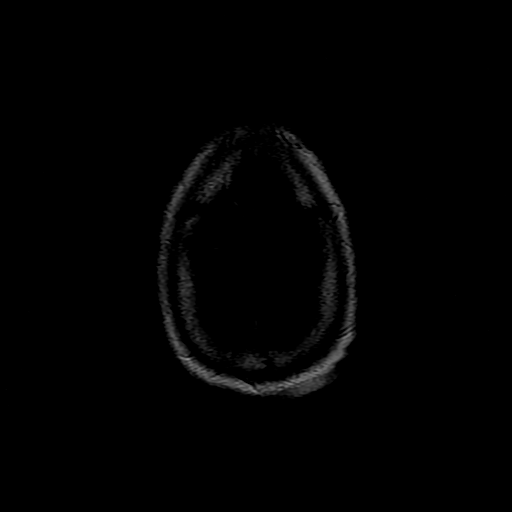

[Series 9: T1 · axial · 3.0mm · 0.47mm/px · 1 of 92 slices shown (2 of 2)]
[im 1/92]
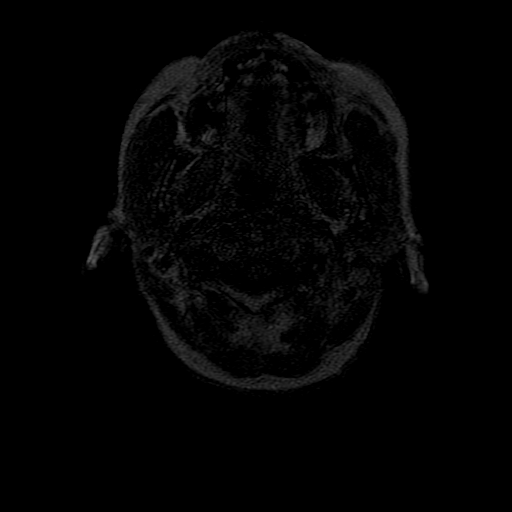

[Series 10: T2 · coronal · 5.0mm · 0.39mm/px · 3 of 26 slices shown (2 of 2)]
[im 1/26]
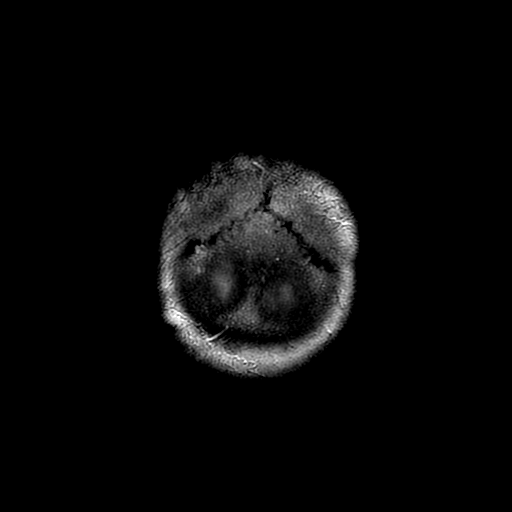
[im 13/26]
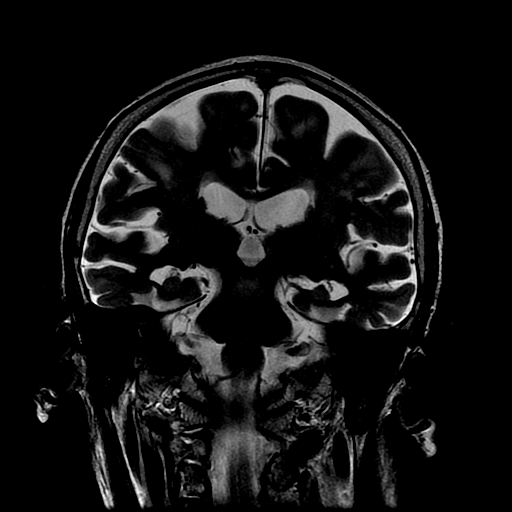
[im 26/26]
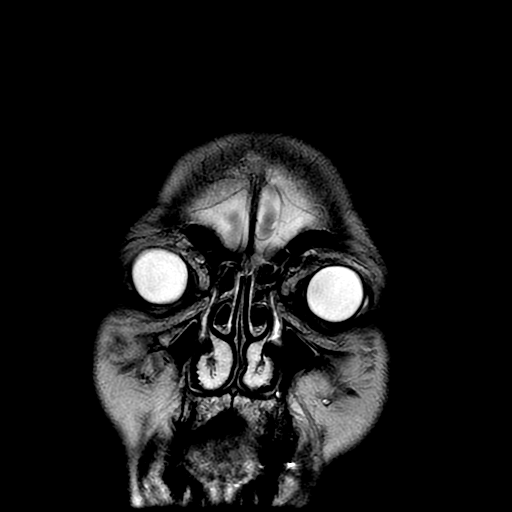

[Series 300: DWI · axial · 3.0mm · 1.09mm/px · z∈[-19,+104]mm · 5 of 44 slices shown (3 of 4)]
[im 1/44]
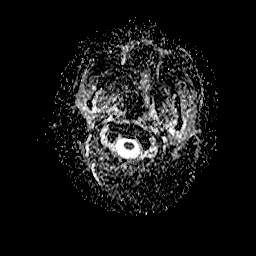
[im 11/44]
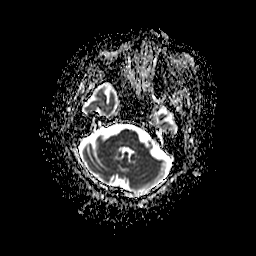
[im 22/44]
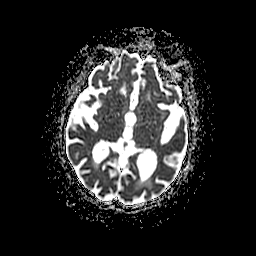
[im 33/44]
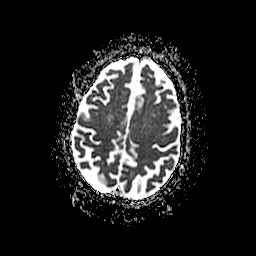
[im 44/44]
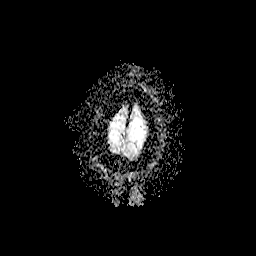

[Series 400: DWI · coronal · 5.0mm · 1.09mm/px · 4 of 34 slices shown (4 of 4)]
[im 1/34]
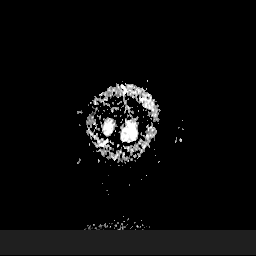
[im 12/34]
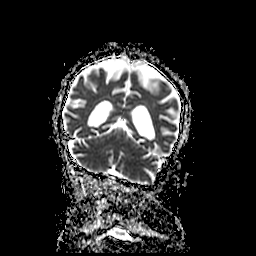
[im 23/34]
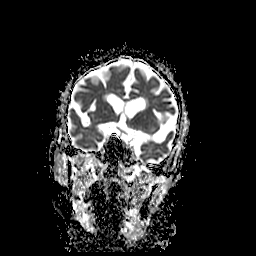
[im 34/34]
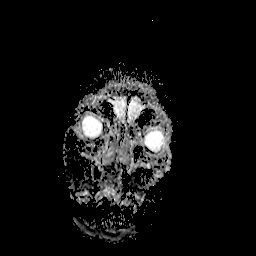

[36 of 48 positions shown; findings below may reference images not displayed]

FINDINGS: Brain:

Mild-to-moderate generalized cerebral atrophy. Comparatively mild
cerebellar atrophy.

5 mm focus of restricted diffusion within the midline dorsal pons
compatible with acute infarct (series 3, image 13) (series 4, image
16).

Mild-to-moderate multifocal T2 FLAIR hyperintense signal abnormality
within the cerebral white matter, nonspecific but compatible chronic
small vessel ischemic disease.

Prominent perivascular space versus small chronic lacunar infarct
within the left caudate head (series 10, image 17).

Punctate chronic microhemorrhage within the left parietal lobe
(series 8, image 15). Additional foci of T2* signal loss within the
bilateral basal ganglia, which may reflect mineralization or
additional punctate chronic microhemorrhages.

No evidence of an intracranial mass.

No extra-axial fluid collection.

No midline shift.

Vascular: Maintained flow voids within the proximal large arterial
vessels.

Skull and upper cervical spine: No focal suspicious marrow lesion.
Incompletely assessed cervical spondylosis

Sinuses/Orbits: Visualized orbits show no acute finding. Bilateral
ocular lens replacements. Mild mucosal thickening within the
bilateral ethmoid, sphenoid and maxillary sinuses.

Other: Trace fluid within the bilateral mastoid air cells.
IMPRESSION: 5 mm acute infarct within the midline dorsal pons.

Mild-to-moderate chronic small vessel ischemic changes within the
cerebral white matter.

Mild-to-moderate generalized cerebral atrophy. Comparatively mild
cerebellar atrophy.

Mild paranasal sinus mucosal thickening.

Trace fluid within the bilateral mastoid air cells.

## 2021-06-07 MED ORDER — TETRACAINE HCL 0.5 % OP SOLN
2.0000 [drp] | Freq: Once | OPHTHALMIC | Status: AC
  Administered 2021-06-07: 2 [drp] via OPHTHALMIC
  Filled 2021-06-07: qty 4

## 2021-06-07 MED ORDER — STROKE: EARLY STAGES OF RECOVERY BOOK
Freq: Once | Status: AC
  Filled 2021-06-07: qty 1

## 2021-06-07 MED ORDER — METOPROLOL SUCCINATE ER 25 MG PO TB24: 12.5000 mg | ORAL_TABLET | Freq: Every day | ORAL | Status: DC

## 2021-06-07 MED ORDER — ENOXAPARIN SODIUM 30 MG/0.3ML IJ SOSY
30.0000 mg | PREFILLED_SYRINGE | INTRAMUSCULAR | Status: DC
  Administered 2021-06-08 – 2021-06-09 (×2): 30 mg via SUBCUTANEOUS
  Filled 2021-06-07 (×2): qty 0.3

## 2021-06-07 MED ORDER — ACETAMINOPHEN 160 MG/5ML PO SOLN: 650.0000 mg | ORAL | Status: DC | PRN

## 2021-06-07 MED ORDER — FUROSEMIDE 10 MG/ML IJ SOLN
20.0000 mg | Freq: Once | INTRAMUSCULAR | Status: AC
  Administered 2021-06-07: 20 mg via INTRAVENOUS
  Filled 2021-06-07: qty 4

## 2021-06-07 MED ORDER — METOPROLOL TARTRATE 5 MG/5ML IV SOLN: 5.0000 mg | Freq: Four times a day (QID) | INTRAVENOUS | Status: DC | PRN

## 2021-06-07 MED ORDER — ENOXAPARIN SODIUM 30 MG/0.3ML IJ SOSY
30.0000 mg | PREFILLED_SYRINGE | INTRAMUSCULAR | Status: DC
  Filled 2021-06-07: qty 0.3

## 2021-06-07 MED ORDER — LORAZEPAM 2 MG/ML IJ SOLN: 0.5000 mg | Freq: Once | INTRAMUSCULAR | Status: DC

## 2021-06-07 MED ORDER — KETOROLAC TROMETHAMINE 0.5 % OP SOLN
1.0000 [drp] | Freq: Three times a day (TID) | OPHTHALMIC | Status: DC
  Administered 2021-06-07 – 2021-06-12 (×14): 1 [drp] via OPHTHALMIC
  Filled 2021-06-07: qty 5

## 2021-06-07 MED ORDER — SENNOSIDES-DOCUSATE SODIUM 8.6-50 MG PO TABS: 1.0000 | ORAL_TABLET | Freq: Every evening | ORAL | Status: DC | PRN

## 2021-06-07 MED ORDER — ACETAMINOPHEN 650 MG RE SUPP: 650.0000 mg | RECTAL | Status: DC | PRN

## 2021-06-07 MED ORDER — CLOPIDOGREL BISULFATE 75 MG PO TABS
75.0000 mg | ORAL_TABLET | Freq: Every morning | ORAL | Status: DC
  Administered 2021-06-08 – 2021-06-10 (×3): 75 mg via ORAL
  Filled 2021-06-07 (×3): qty 1

## 2021-06-07 MED ORDER — INSULIN ASPART 100 UNIT/ML IJ SOLN
0.0000 [IU] | Freq: Three times a day (TID) | INTRAMUSCULAR | Status: DC
  Administered 2021-06-08 – 2021-06-12 (×3): 1 [IU] via SUBCUTANEOUS

## 2021-06-07 MED ORDER — FLUORESCEIN SODIUM 1 MG OP STRP
1.0000 | ORAL_STRIP | Freq: Once | OPHTHALMIC | Status: AC
  Administered 2021-06-07: 1 via OPHTHALMIC
  Filled 2021-06-07: qty 1

## 2021-06-07 MED ORDER — ACETAMINOPHEN 325 MG PO TABS: 650.0000 mg | ORAL_TABLET | ORAL | Status: DC | PRN

## 2021-06-07 NOTE — Assessment & Plan Note (Addendum)
Has been going on x 2-3 years.

## 2021-06-07 NOTE — Assessment & Plan Note (Addendum)
Seems to be at baseline.

## 2021-06-07 NOTE — Assessment & Plan Note (Addendum)
Patient with significant visual disturbances. MRI showed acute infarct.  Neurology is following.  Appears to have newly diagnosed paroxysmal atrial fibrillation. Carotid Doppler did not reveal any significant stenosis.  MRA head was ordered by neurology. Patient was initially started on aspirin and Plavix.  Anticoagulation issue was discussed with patient and her son.  After extensive discussions regarding risks and benefits family and patient have elected to go ahead with the low-dose Eliquis which was initiated yesterday.  Discontinue aspirin and Plavix.  This was discussed with neurology who agrees with plan. LDL 67.  HbA1c is 6.3.  No statin as LDL is below goal. Seen by PT OT and SLP.  Skilled nursing facility is recommended for short-term rehab. Echocardiogram reveals normal systolic function.  Left atrial dilatation noted. On dysphagia 3 diet with thin liquids.

## 2021-06-07 NOTE — Assessment & Plan Note (Addendum)
Hypokalemia  Permissive hypertension was allowed initially.  Continued on metoprolol due to atrial fibrillation.  Started on amlodipine.  Holding HCTZ due to elevated creatinine.  Potassium is improved.

## 2021-06-07 NOTE — ED Notes (Signed)
Provided update to pt's son, Richardson Landry

## 2021-06-07 NOTE — ED Triage Notes (Signed)
Pt BIBA form [facility]. EMS was called out for progressively worsening blurry vision since 1600 yesterday. Pt also c/o progressive generalized weakness x 3 weakness. Left sided gaze noted with EMS. Pt is normally ambulatory with walker, however now unable to support herself at all.  Hx of pitting edema - noted in ankles.   180/90  LVO 2 with EMS

## 2021-06-07 NOTE — ED Notes (Signed)
Pt to XR

## 2021-06-07 NOTE — H&P (Addendum)
History and Physical    Mia [name] RAQ:[id number] DOB: Dec 30, 1925 DOA: 06/07/2021  PCP: Sandrea Hughs, NP Consultants:  cardiology: Dr. Debara Pickett, GI: Dr. Collene Mares  Patient coming from:  [facility]   Chief Complaint: weakness and vision changes   HPI: Mia [name] is a 88 y.o. female with medical history significant of CKD, T2DM, HTN, gout, hx of TIA, hx of VF who presented to ED with several week history of weakness and vision changes that started this AM. She had a headache prior to the vision loss and states she has been a little dizzy. She states her vision was blurry, worse in the left eye. She can not really see well unless she closes her left eye. Denies any slurred speech, unilateral weakness or drooping face. Has aphasia that is baseline and started about 2-3 years ago per family.   She has not taken her medication in 3 days per family.   She denies any fever/chills, chest pain or palpitations, shortness of breath or cough, abdominal pain, N/V/D, dysuria.   ED Course: vitals: afebrile, bp: 147/66, HR: 89, RR: 17, oxygen: 100%RA Pertinent labs: creatinine: 1.69 (1.6-1.7), bnp: 335,  CXR: hyperexpansion, no acute findings CTH: no acute finding MRI brain: 64mm acute infarct within the midline dorsal pons.  In ED: neurology consulted. Patient given ativan and TRH was asked to admit.   Review of Systems: As per HPI; otherwise review of systems reviewed and negative.   Ambulatory Status:  Ambulates with cane and walker    Past Medical History:  Diagnosis Date   Arthritis    oa   Chronic kidney disease    chronic kidney diseaase follow by primary md   Diabetes mellitus without complication ([place])    Diet controlled   Former smoker    H/O ventricular fibrillation 10 yrs ago dx   Hypertension    Prediabetes    Seasonal allergies    TIA (transient ischemic attack) none recent    Past Surgical History:  Procedure Laterality Date   NM MYOCAR PERF WALL MOTION   01/2008   lexiscan myoview - normal pattern of perfusion in all regions, EF 92%, no significant ischemia demonstated   TONSILLECTOMY  age 69   and adenoids   TOTAL KNEE ARTHROPLASTY Right 10/06/2015   Procedure: RIGHT TOTAL KNEE ARTHROPLASTY;  Surgeon: [name], MD;  Location: [facility];  Service: Orthopedics;  Laterality: Right;   TRANSTHORACIC ECHOCARDIOGRAM  12/2005   mild DUST, EF=>55%; mild mitral annular calcif; mild-mod aortic root calcif    Social History   Socioeconomic History   Marital status: Widowed    Spouse name: Not on file   Number of children: 4   Years of education: PhD   Highest education level: Not on file  Occupational History   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   Drug use: No   Sexual activity: Never  Other Topics Concern   Not on file  Social History Narrative   Not on file   Social Determinants of Health   Financial Resource Strain: Not on file  Food Insecurity: Not on file  Transportation Needs: Not on file  Physical Activity: Not on file  Stress: Not on file  Social Connections: Not on file  Intimate Partner Violence: Not on file    No Known Allergies  Family History  Problem Relation Age of Onset   Congestive Heart Failure Mother    Alcohol abuse Father    Heart disease Son    Bipolar disorder Son    Lung cancer Brother    Post-traumatic stress disorder Daughter     Prior to Admission medications   Medication Sig Start Date End Date Taking? Authorizing Provider  allopurinol (ZYLOPRIM) 100 MG tablet Take  1 tablet (100 mg total) by mouth daily. 03/29/21   Ngetich, Dinah C, NP  clopidogrel (PLAVIX) 75 MG tablet TAKE 1 TABLET(75 MG) BY MOUTH DAILY 03/27/20   Reed, Tiffany L, DO  hydrochlorothiazide (HYDRODIURIL) 12.5 MG tablet Take 1 tablet (12.5 mg total) by mouth every other day. 03/17/20   Reed, Tiffany L, DO  ketorolac (ACULAR) 0.5 % ophthalmic solution INSTILL 1 DROP IN RIGHT EYE FOUR TIMES DAILY 04/30/21   Rankin, Clent Demark, MD  metoprolol succinate (TOPROL-XL) 25 MG 24 hr tablet TAKE 1 TABLET(25 MG) BY MOUTH DAILY 06/26/20   Reed, Tiffany L, DO  potassium chloride (KLOR-CON) 10 MEQ tablet TAKE 1 TABLET(10 MEQ) BY MOUTH DAILY 03/17/20   Gayland Curry, DO    Physical Exam: Vitals:   06/07/21 1730 06/07/21 1800 06/07/21 1830 06/07/21 2113  BP: (!) 184/79 (!) 178/95 (!) 185/71 (!) 159/88  Pulse: 93 91 87 66  Resp: 19 13 20 17   Temp:    97.8 F (36.6 C)  TempSrc:    Oral  SpO2: 100% 100% 100% 100%  Weight:    60.2 kg  Height:    5\' 7"  (1.702 m)     General:  Appears calm and comfortable and is in NAD Eyes:  PERRL, EOMI, normal lids, iris ENT:  grossly normal hearing, lips & tongue, mmm; appropriate dentition Neck:  no LAD, masses or thyromegaly; no carotid bruits, no JVD Cardiovascular:  irregularly, irregular, no m/r/g. Bilateral leg swelling above the foot. Pitting edema.  Respiratory:   CTA bilaterally with no wheezes/rales/rhonchi.  Normal respiratory effort. Abdomen:  soft, NT, ND, NABS Back:   normal alignment, no CVAT Skin:  no rash or induration seen on limited exam Musculoskeletal:  grossly normal tone BUE/BLE, good ROM, no bony abnormality Lower extremity:   Limited foot exam with no ulcerations.  2+ distal pulses. Psychiatric:  grossly normal mood and affect, speech aphasic and appropriate, AOx3 Neurologic:  left lateral gaze palsy. CN IV-12 grossly intact, moves all extremities in coordinated fashion, sensation intact. FTN intact on right. Can not do in left visual field  well. Gait deferred. Negative pronator drift.     Radiological Exams on Admission: Independently reviewed - see discussion in A/P where applicable  DG Chest 2 View  Result Date: 06/07/2021 CLINICAL DATA:  Shortness of breath with lower extremity edema. EXAM: CHEST - 2 VIEW COMPARISON:  05/07/2007. FINDINGS: Lungs are hyperexpanded. The lungs are clear without focal pneumonia, edema, pneumothorax or pleural effusion. The cardio pericardial silhouette is enlarged. Bones are diffusely demineralized. Telemetry leads overlie the chest. IMPRESSION: Hyperexpansion without acute cardiopulmonary findings. Electronically Signed   By: Misty Stanley M.D.   On: 06/07/2021 13:20  CT HEAD WO CONTRAST (5MM)  Result Date: 06/07/2021 CLINICAL DATA:  Left eye vision problems EXAM: CT HEAD WITHOUT CONTRAST TECHNIQUE: Contiguous axial images were obtained from the base of the skull through the vertex without intravenous contrast. RADIATION DOSE REDUCTION: This exam was performed according to the departmental dose-optimization program which includes automated exposure control, adjustment of the mA and/or kV according to patient size and/or use of iterative reconstruction technique. COMPARISON:  Hypo CT dated January 05, 2019 FINDINGS: Brain: Chronic white matter ischemic change. No evidence of acute infarction, hemorrhage, hydrocephalus, extra-axial collection or mass lesion/mass effect. Vascular: No hyperdense vessel or unexpected calcification. Skull: Normal. Negative for fracture or focal lesion. Sinuses/Orbits: No acute finding. Other: None. IMPRESSION: No acute intracranial abnormality. Electronically Signed   By: Yetta Glassman M.D.   On: 06/07/2021 13:37   MR BRAIN WO CONTRAST  Result Date: 06/07/2021 CLINICAL DATA:  Provided history: Neuro deficit, acute, stroke suspected. Additional history provided: Progressively worsening blurry vision, generalized weakness, left-sided gaze noted. EXAM: MRI HEAD WITHOUT  CONTRAST TECHNIQUE: Multiplanar, multiecho pulse sequences of the brain and surrounding structures were obtained without intravenous contrast. COMPARISON:  Head CT 06/07/2021. Report from brain MRI 07/08/2000 (images unavailable). FINDINGS: Brain: Mild-to-moderate generalized cerebral atrophy. Comparatively mild cerebellar atrophy. 5 mm focus of restricted diffusion within the midline dorsal pons compatible with acute infarct (series 3, image 13) (series 4, image 16). Mild-to-moderate multifocal T2 FLAIR hyperintense signal abnormality within the cerebral white matter, nonspecific but compatible chronic small vessel ischemic disease. Prominent perivascular space versus small chronic lacunar infarct within the left caudate head (series 10, image 17). Punctate chronic microhemorrhage within the left parietal lobe (series 8, image 15). Additional foci of T2* signal loss within the bilateral basal ganglia, which may reflect mineralization or additional punctate chronic microhemorrhages. No evidence of an intracranial mass. No extra-axial fluid collection. No midline shift. Vascular: Maintained flow voids within the proximal large arterial vessels. Skull and upper cervical spine: No focal suspicious marrow lesion. Incompletely assessed cervical spondylosis Sinuses/Orbits: Visualized orbits show no acute finding. Bilateral ocular lens replacements. Mild mucosal thickening within the bilateral ethmoid, sphenoid and maxillary sinuses. Other: Trace fluid within the bilateral mastoid air cells. IMPRESSION: 5 mm acute infarct within the midline dorsal pons. Mild-to-moderate chronic small vessel ischemic changes within the cerebral white matter. Mild-to-moderate generalized cerebral atrophy. Comparatively mild cerebellar atrophy. Mild paranasal sinus mucosal thickening. Trace fluid within the bilateral mastoid air cells. Electronically Signed   By: Kellie Simmering D.O.   On: 06/07/2021 15:32    EKG: Independently reviewed.  NSR  with rate 89, RBBB and LAFB; nonspecific ST changes with no evidence of acute ischemia. Similar to previous ekg.    Labs on Admission: I have personally reviewed the available labs and imaging studies at the time of the admission.  Pertinent labs:  creatinine: 1.69 (1.6-1.7),  bnp: 335,     Assessment/Plan * CVA (cerebral vascular accident) ([place])- (present on admission) -place in observation on telemetry for stroke work-up -Neurochecks per protocol -Neurology consulted -MRI brain without contrast ordered  -echo -a1c/lipid panel -further imaging per neurology -continue plavix for now. Discussed DOAC with family with new PAF in setting of CVA. They are thinking on this. - -Permissive hypertension first 24 hours <220/110 -N.p.o. until bedside swallow screen -PT/ OT/ SLP consult   AF (paroxysmal atrial fibrillation) ([place])- (present on admission) Appears to be new diagnosis for her. Family  Unaware of this and I can not find any records mentioning this  in the [place] records Rate has been acceptable. 105-110 Start low dose beta blocker tomorrow. Will hold today for permissive HTN in setting of acute CVA  CHA2DS2-VASc score of 6. Did discuss anticoagulation with family especially in light of new CVA. They will discuss.  Continue with telemetry  -echo pending   Swelling of lower leg- (present on admission) History of lower leg swelling, but patient states worse bnp 335, CXR with no edema and no JVD on exam Patient with no shortness of breath or orthopnea With new PAF, ? Possible new CHF Echo pending Give 1 dose of IV lasix 20mg  now Strict I/O and daily weights  Essential hypertension, benign- (present on admission) Hypertensive, will allow for permissive HTN in first 24 hours Hold home meds for now.  Prn parameters with IV lopressor   CKD (chronic kidney disease) stage 4, GFR 15-29 ml/min (HCC)- (present on admission) At baseline: 1.6-1.7 Follow   Type 2 diabetes mellitus  without complication (HCC) Diet controlled. Last a1c in 10/21 was 6.1 a1c pending for AM  SSI and accuchecks per protocol   Mild cognitive impairment with memory loss- (present on admission) At baseline, delirium precautions   Aphasia Has been going on x 2-3 years, confirmed with family.  ST consult       Body mass index is 20.79 kg/m.    Level of care: Telemetry Medical DVT prophylaxis:  Lovenox  Code Status:  DNR- confirmed with patient/family Family Communication: None present; I spoke with the patient's son by telephone at the time of admission. Milinda Sweeney [phone] Disposition Plan:  The patient is from:   Anticipated d/c is to: home without [facility] services once her cardiology issues have been resolved.   Patient placed in observation as anticipate less than 2 midnight stay. Requires hospitalization for acute CVA, new atrial fibrillation and need for testing, constant monitoring and MDM with specialists.    Patient is currently: stable  Consults called: neurology   Admission status:  observation    Orma Flaming MD [facility]   How to contact the [facility] Attending or Consulting provider [place] or covering provider during after hours [place], for this patient?  Check the care team in [facility] and look for a) attending/consulting [facility] provider listed and b) the [facility] team listed Log into [email or web address] and use [place]'s universal password to access. If you do not have the password, please contact the hospital operator. Locate the [facility] provider you are looking for under [facility] and page to a number that you can be directly reached. If you still have difficulty reaching the provider, please page the [facility] (Director on Call) for the Hospitalists listed on amion for assistance.   06/07/2021, 9:49 PM

## 2021-06-07 NOTE — ED Provider Notes (Signed)
[facility] EMERGENCY DEPARTMENT Provider Note   CSN: [id number] Arrival date & time: 06/07/21  1222    History  Chief Complaint  Patient presents with   Weakness    Mia [name] is a 88 y.o. female here for evaluation of weakness.  Has felt generally fatigued over the last few weeks.  Is being followed by PCP for blister to right lower extremity which subsequently popped.  Has noticed some lower extremity edema however patient denies any history of CHF, lateral weakness, no associated pain.  No erythema, warmth, purulent drainage to blister.  She is not anticoagulated, she denies any fever, chest pain, shortness of breath.  Stated yesterday however unknown time she noted some blurring vision to her left eye.  Has also had some subsequent watery drainage.  No unilateral weakness, paresthesias.  She denies any prior history of stroke.  She is poor historian, unable to describe her blurred vision. However if you stand on left side has difficulty seeing out of left eye. She denies any headache or dizziness.  HPI     Home Medications Prior to Admission medications   Medication Sig Start Date End Date Taking? Authorizing Provider  allopurinol (ZYLOPRIM) 100 MG tablet Take 1 tablet (100 mg total) by mouth daily. 03/29/21   Ngetich, Dinah C, NP  cholecalciferol (VITAMIN D) 1000 UNITS tablet Take 1,000 Units by mouth daily. D3    [provider]  clopidogrel (PLAVIX) 75 MG tablet TAKE 1 TABLET(75 MG) BY MOUTH DAILY 03/27/20   Reed, Tiffany L, DO  hydrochlorothiazide (HYDRODIURIL) 12.5 MG tablet Take 1 tablet (12.5 mg total) by mouth every other day. 03/17/20   Reed, Tiffany L, DO  ketorolac (ACULAR) 0.5 % ophthalmic solution INSTILL 1 DROP IN RIGHT EYE FOUR TIMES DAILY 04/30/21   Rankin, Clent Demark, MD  metoprolol succinate (TOPROL-XL) 25 MG 24 hr tablet TAKE 1 TABLET(25 MG) BY MOUTH DAILY 06/26/20   Reed, Tiffany L, DO  potassium chloride (KLOR-CON) 10 MEQ tablet TAKE 1  TABLET(10 MEQ) BY MOUTH DAILY 03/17/20   Reed, Tiffany L, DO      Allergies    Patient has no known allergies.    Review of Systems   Review of Systems  Constitutional:  Positive for fatigue (x weeks).  HENT: Negative.    Eyes:  Positive for visual disturbance. Negative for photophobia, pain, redness and itching.  Respiratory: Negative.    Cardiovascular:  Positive for leg swelling (x weeks).  Gastrointestinal: Negative.   Genitourinary: Negative.   Musculoskeletal: Negative.   Neurological:  Positive for weakness (generalized). Negative for dizziness, facial asymmetry, speech difficulty, light-headedness and headaches.   Physical Exam Updated Vital Signs BP (!) 160/89    Pulse 85    Temp 97.9 F (36.6 C) (Oral)    Resp 19    Ht 5\' 7"  (1.702 m)    Wt 61.2 kg    SpO2 100%    BMI 21.14 kg/m  Physical Exam Physical Exam  Constitutional: Pt is oriented to person, place, and time. Pt appears well-developed and well-nourished. No distress.  HENT:  Head: Normocephalic and atraumatic.  Mouth/Throat: Oropharynx is clear and moist.  Eyes: Conjunctivae and EOM are normal. Cloudy pupils BL, worse to left. Pupils are equal, round, and reactive to light. No scleral icterus.  No horizontal, vertical or rotational nystagmus  Neck: Normal range of motion. Neck supple.  Full active and passive ROM without pain No midline or paraspinal tenderness No nuchal rigidity or meningeal  signs  Cardiovascular: Normal rate, regular rhythm and intact distal pulses.   Pulmonary/Chest: Effort normal and breath sounds normal. No respiratory distress. Pt has no wheezes. No rales.  Abdominal: Soft. Bowel sounds are normal. There is no tenderness. There is no rebound and no guarding.  Musculoskeletal: Normal range of motion.  Lymphadenopathy:  No cervical adenopathy.  Neurological: Pt. is alert and oriented to person, place, and time. She has normal reflexes. No cranial nerve deficit.  Exhibits normal muscle  tone. Coordination normal.  Mental Status:  Alert, oriented, thought content appropriate. Speech fluent without evidence of aphasia. Able to follow 2 step commands without difficulty.  Cranial Nerves:  II:  Unable to see left visual field deficit on left eye. Pupils equal, round, reactive to light, appear to gaze to left with left eye III,IV, VI: ptosis not present, extra-ocular motions intact bilaterally V,VII: smile symmetric, facial light touch sensation equal VIII: hearing grossly normal bilaterally  IX,X: midline uvula rise  XI: bilateral shoulder shrug equal and strong XII: midline tongue extension  Motor:  5/5 in upper and lower extremities bilaterally including strong and equal grip strength and dorsiflexion/plantar flexion Sensory: Pinprick and light touch normal in all extremities.  Cerebellar: Abnormal finger-nose to left eye CV: distal pulses palpable throughout   Skin: Skin is warm and dry. No rash noted. Pt is not diaphoretic.  Psychiatric: Pt has a normal mood and affect. Behavior is normal. Judgment and thought content normal.  Nursing note and vitals reviewed.  ED Results / Procedures / Treatments   Labs (all labs ordered are listed, but only abnormal results are displayed) Labs Reviewed  CBC WITH DIFFERENTIAL/PLATELET - Abnormal; Notable for the following components:      Result Value   Lymphs Abs 0.5 (*)    All other components within normal limits  BASIC METABOLIC PANEL - Abnormal; Notable for the following components:   Glucose, Bld 131 (*)    BUN 26 (*)    Creatinine, Ser 1.69 (*)    GFR, Estimated 28 (*)    All other components within normal limits  BRAIN NATRIURETIC PEPTIDE - Abnormal; Notable for the following components:   B Natriuretic Peptide 335.7 (*)    All other components within normal limits  RESP PANEL BY RT-PCR (FLU A&B, COVID) [id number]  URINE CULTURE  PROTIME-INR  URINALYSIS, ROUTINE W REFLEX MICROSCOPIC    EKG EKG  Interpretation  Date/Time:  Thursday June 07 2021 12:32:22 EST Ventricular Rate:  93 PR Interval:  130 QRS Duration: 136 QT Interval:  407 QTC Calculation: 467 R Axis:   -32 Text Interpretation: Sinus rhythm Supraventricular bigeminy Probable left atrial enlargement Right bundle branch block Minimal ST elevation, lateral leads Baseline wander in lead(s) V3 No significant change since last tracing Confirmed by Isla Pence [id number]) on 06/07/2021 12:41:45 PM  Radiology DG Chest 2 View  Result Date: 06/07/2021 CLINICAL DATA:  Shortness of breath with lower extremity edema. EXAM: CHEST - 2 VIEW COMPARISON:  05/07/2007. FINDINGS: Lungs are hyperexpanded. The lungs are clear without focal pneumonia, edema, pneumothorax or pleural effusion. The cardio pericardial silhouette is enlarged. Bones are diffusely demineralized. Telemetry leads overlie the chest. IMPRESSION: Hyperexpansion without acute cardiopulmonary findings. Electronically Signed   By: Misty Stanley M.D.   On: 06/07/2021 13:20   CT HEAD WO CONTRAST (5MM)  Result Date: 06/07/2021 CLINICAL DATA:  Left eye vision problems EXAM: CT HEAD WITHOUT CONTRAST TECHNIQUE: Contiguous axial images were obtained from the base of the skull through the  vertex without intravenous contrast. RADIATION DOSE REDUCTION: This exam was performed according to the departmental dose-optimization program which includes automated exposure control, adjustment of the mA and/or kV according to patient size and/or use of iterative reconstruction technique. COMPARISON:  Hypo CT dated January 05, 2019 FINDINGS: Brain: Chronic white matter ischemic change. No evidence of acute infarction, hemorrhage, hydrocephalus, extra-axial collection or mass lesion/mass effect. Vascular: No hyperdense vessel or unexpected calcification. Skull: Normal. Negative for fracture or focal lesion. Sinuses/Orbits: No acute finding. Other: None. IMPRESSION: No acute intracranial abnormality.  Electronically Signed   By: Yetta Glassman M.D.   On: 06/07/2021 13:37   MR BRAIN WO CONTRAST  Result Date: 06/07/2021 CLINICAL DATA:  Provided history: Neuro deficit, acute, stroke suspected. Additional history provided: Progressively worsening blurry vision, generalized weakness, left-sided gaze noted. EXAM: MRI HEAD WITHOUT CONTRAST TECHNIQUE: Multiplanar, multiecho pulse sequences of the brain and surrounding structures were obtained without intravenous contrast. COMPARISON:  Head CT 06/07/2021. Report from brain MRI 07/08/2000 (images unavailable). FINDINGS: Brain: Mild-to-moderate generalized cerebral atrophy. Comparatively mild cerebellar atrophy. 5 mm focus of restricted diffusion within the midline dorsal pons compatible with acute infarct (series 3, image 13) (series 4, image 16). Mild-to-moderate multifocal T2 FLAIR hyperintense signal abnormality within the cerebral white matter, nonspecific but compatible chronic small vessel ischemic disease. Prominent perivascular space versus small chronic lacunar infarct within the left caudate head (series 10, image 17). Punctate chronic microhemorrhage within the left parietal lobe (series 8, image 15). Additional foci of T2* signal loss within the bilateral basal ganglia, which may reflect mineralization or additional punctate chronic microhemorrhages. No evidence of an intracranial mass. No extra-axial fluid collection. No midline shift. Vascular: Maintained flow voids within the proximal large arterial vessels. Skull and upper cervical spine: No focal suspicious marrow lesion. Incompletely assessed cervical spondylosis Sinuses/Orbits: Visualized orbits show no acute finding. Bilateral ocular lens replacements. Mild mucosal thickening within the bilateral ethmoid, sphenoid and maxillary sinuses. Other: Trace fluid within the bilateral mastoid air cells. IMPRESSION: 5 mm acute infarct within the midline dorsal pons. Mild-to-moderate chronic small vessel  ischemic changes within the cerebral white matter. Mild-to-moderate generalized cerebral atrophy. Comparatively mild cerebellar atrophy. Mild paranasal sinus mucosal thickening. Trace fluid within the bilateral mastoid air cells. Electronically Signed   By: Kellie Simmering D.O.   On: 06/07/2021 15:32    Procedures .Critical Care Performed by: Nettie Elm, PA-C Authorized by: Nettie Elm, PA-C   Critical care provider statement:    Critical care time (minutes):  30   Critical care was necessary to treat or prevent imminent or life-threatening deterioration of the following conditions:  Circulatory failure   Critical care was time spent personally by me on the following activities:  Development of treatment plan with patient or surrogate, discussions with consultants, evaluation of patient's response to treatment, examination of patient, ordering and review of laboratory studies, ordering and review of radiographic studies, ordering and performing treatments and interventions, pulse oximetry, re-evaluation of patient's condition and review of old charts    Medications Ordered in ED Medications  LORazepam (ATIVAN) injection 0.5 mg (0 mg Intravenous Hold 06/07/21 1417)  tetracaine (PONTOCAINE) 0.5 % ophthalmic solution 2 drop (2 drops Left Eye Given 06/07/21 1420)  fluorescein ophthalmic strip 1 strip (1 strip Left Eye Given 06/07/21 1421)    ED Course/ Medical Decision Making/ A&P    88 year old here for evaluation of weakness and blurred vision.  Sounds like weakness has been going on over the last few weeks.  Left eye blurred vision however patient poor historian, really unable to describe.  She does have abnormal finger-to-nose on the left.  Seems to have left gaze with eye.  She denies any pain.  Otherwise no facial droop, slurred speech, seems globally weak however likely appropriate for 58 year old, no paresthesias.  She is able to follow commands.  She does have moderate size  blister which has ruptured to her right lower extremity.  Some lower extremity pitting edema however denies history of CHF.  Her heart and lungs are clear.  Wound does not appear actively infected and patient states she is actually [facility] and has significantly improved.  She has equal pulses bilaterally, compartments soft without any bony tenderness.  Patient outside code stroke, LVO window.  Will proceed with stroke work-up and reassess  Labs and imaging personally reviewed and interpreted:  CBC without leukocytosis BMP creatinine 1.69, similar to prior BNP 335 COVID/Flu neg CT head without acute findings DG chest without cardiomegaly, pul edema EKG without ischemic changes  Patient reassessed. MR brain positive for CVA.   Discussed plan with patient.  She is agreeable for admission.                           Medical Decision Making Amount and/or Complexity of Data Reviewed Independent Historian: EMS    Details: EMS External Data Reviewed: labs, radiology and notes. Labs: ordered. Decision-making details documented in ED Course. Radiology: ordered and independent interpretation performed. Decision-making details documented in ED Course. ECG/medicine tests: ordered and independent interpretation performed. Decision-making details documented in ED Course.  Risk OTC drugs. Prescription drug management. Parenteral controlled substances. Decision regarding hospitalization. Diagnosis or treatment significantly limited by social determinants of health. Risk Details: Elderly patient will admit for CVA workup  Critical Care Total time providing critical care: 30-74 minutes          Final Clinical Impression(s) / ED Diagnoses Final diagnoses:  Cerebrovascular accident (CVA), unspecified mechanism ([place])  Leg edema  Blister    Rx / DC Orders ED Discharge Orders     None         Delaney Perona A, PA-C 06/07/21 1549    Isla Pence,  MD 06/09/21 [phone]

## 2021-06-07 NOTE — Assessment & Plan Note (Addendum)
Appears to be new diagnosis for her. Family was unaware of this.  Started on low-dose beta-blocker.  Heart rate is well controlled. CHA2DS2-VASc score of 6.  After extensive discussions with patient and family patient started on Eliquis. Echocardiogram shows normal systolic function.  Left atrial dilatation noted. TSH 1.25.

## 2021-06-07 NOTE — ED Notes (Signed)
Pt to CT

## 2021-06-07 NOTE — Assessment & Plan Note (Addendum)
Has a known history of pedal edema.  Was given 1 dose of Lasix.  Swelling has improved.  Echocardiogram shows normal systolic function

## 2021-06-07 NOTE — Assessment & Plan Note (Addendum)
HbA1c 6.3.  Diet controlled at home.  Continue SSI.

## 2021-06-07 NOTE — Assessment & Plan Note (Addendum)
Seems to be close to baseline.  Monitor periodically.

## 2021-06-07 NOTE — ED Notes (Signed)
Pt asleep in room, no complaints

## 2021-06-07 NOTE — ED Provider Notes (Signed)
3:58 PM Acute stroke, neurologist have been consulted.  He recommend complete eye exam, consult medicine for stroke work up and neurology will see pt.    On eye exam pt has a left eye lateral deviation, with poor eye tracking and diplopia.   Received signout from previous provider, please see her note for complete H&P.  This is a 88 year old female who was brought here via EMS from [facility] due to complaints of generalized weakness and vision changes.  She has significant pertinent history including TIA, hypertension, PVC, CKD and cystoid macular edema of the right.  She also has history of diabetes, she is currently on Plavix.  She was initially noted to have a left-sided gaze by EMS.  She normally is walk with a walker but now unable to support herself, last known normal in regards to her vision was sometime yesterday.  This patient presents to the ED for concern of acute stroke, this involves an extensive number of treatment options, and is a complaint that carries with it a high risk of complications and morbidity.  The differential diagnosis includes stroke, electrolytes imbalance, anemia, delirium, ophthalmologic source  Co morbidities that complicate the patient evaluation DM, TIA, HTN Additional history obtained:  Additional history obtained from son who is at bedside, from EMS, and from prior notes External records from outside source obtained and reviewed including outside notes   Lab Tests:  I Ordered, and personally interpreted labs.  The pertinent results include:  Cr 1.69, similar to value a year ago. BNP 335.7, no prior hx of CHF. Pt does have 1+ pitting edema to BLE  Imaging Studies ordered:  I ordered imaging studies including brain MRI I independently visualized and interpreted imaging which showed 75mm acute infarct within the midline dorsal pons I agree with the radiologist interpretation  Cardiac Monitoring:  The patient was maintained on a  cardiac monitor.  I personally viewed and interpreted the cardiac monitored which showed an underlying rhythm of: sinus rhythm with RBBB  Medicines ordered and prescription drug management:  Critical Interventions: acute stroke, neurology was consulted  Consultations Obtained:  I requested consultation with the neurology,  and discussed lab and imaging findings as well as pertinent plan - they recommend: hospital admission for stroke work up  Problem List / ED Course: weakness and visual changes along with gait imbalance  Reevaluation:  After the interventions noted above, I reevaluated the patient and found that they have :stayed the same  Dispostion:  After consideration of the diagnostic results and the patients response to treatment, I feel that the patent would benefit from hospital admission.  Appreciate consultation from [facility] Dr. Rogers Blocker who agrees to see and will admit pt for stroke work up.  Pt is made aware and agrees with plan.    .Critical Care Performed by: Domenic Moras, PA-C Authorized by: Domenic Moras, PA-C   Critical care provider statement:    Critical care time (minutes):  45   Critical care was time spent personally by me on the following activities:  Development of treatment plan with patient or surrogate, discussions with consultants, evaluation of patient's response to treatment, examination of patient, ordering and review of laboratory studies, ordering and review of radiographic studies, ordering and performing treatments and interventions, pulse oximetry, re-evaluation of patient's condition and review of old charts  BP (!) 184/79    Pulse 93    Temp 97.9 F (36.6 C) (Oral)    Resp 19    Ht  5\' 7"  (1.702 m)    Wt 61.2 kg    SpO2 100%    BMI 21.14 kg/m   Results for orders placed or performed during the hospital encounter of 06/07/21  Resp Panel by RT-PCR (Flu A&B, Covid) Nasopharyngeal Swab   Specimen: Nasopharyngeal Swab; Nasopharyngeal(NP) swabs  in vial transport medium  Result Value Ref Range   SARS Coronavirus 2 by RT PCR NEGATIVE NEGATIVE   Influenza A by PCR NEGATIVE NEGATIVE   Influenza B by PCR NEGATIVE NEGATIVE  CBC with Differential  Result Value Ref Range   WBC 5.4 4.0 - 10.5 K/uL   RBC 4.29 3.87 - 5.11 MIL/uL   Hemoglobin 13.0 12.0 - 15.0 g/dL   HCT 40.0 36.0 - 46.0 %   MCV 93.2 80.0 - 100.0 fL   MCH 30.3 26.0 - 34.0 pg   MCHC 32.5 30.0 - 36.0 g/dL   RDW 15.5 11.5 - 15.5 %   Platelets 217 150 - 400 K/uL   nRBC 0.0 0.0 - 0.2 %   Neutrophils Relative % 85 %   Neutro Abs 4.6 1.7 - 7.7 K/uL   Lymphocytes Relative 10 %   Lymphs Abs 0.5 (L) 0.7 - 4.0 K/uL   Monocytes Relative 5 %   Monocytes Absolute 0.3 0.1 - 1.0 K/uL   Eosinophils Relative 0 %   Eosinophils Absolute 0.0 0.0 - 0.5 K/uL   Basophils Relative 0 %   Basophils Absolute 0.0 0.0 - 0.1 K/uL   Immature Granulocytes 0 %   Abs Immature Granulocytes 0.01 0.00 - 0.07 K/uL  Basic metabolic panel  Result Value Ref Range   Sodium 145 135 - 145 mmol/L   Potassium 3.9 3.5 - 5.1 mmol/L   Chloride 109 98 - 111 mmol/L   CO2 25 22 - 32 mmol/L   Glucose, Bld 131 (H) 70 - 99 mg/dL   BUN 26 (H) 8 - 23 mg/dL   Creatinine, Ser 1.69 (H) 0.44 - 1.00 mg/dL   Calcium 9.7 8.9 - 10.3 mg/dL   GFR, Estimated 28 (L) >60 mL/min   Anion gap 11 5 - 15  Protime-INR  Result Value Ref Range   Prothrombin Time 13.0 11.4 - 15.2 seconds   INR 1.0 0.8 - 1.2  Brain natriuretic peptide  Result Value Ref Range   B Natriuretic Peptide 335.7 (H) 0.0 - 100.0 pg/mL  Urinalysis, Routine w reflex microscopic Urine, Clean Catch  Result Value Ref Range   Color, Urine YELLOW YELLOW   APPearance CLEAR CLEAR   Specific Gravity, Urine 1.020 1.005 - 1.030   pH 6.0 5.0 - 8.0   Glucose, UA NEGATIVE NEGATIVE mg/dL   Hgb urine dipstick NEGATIVE NEGATIVE   Bilirubin Urine NEGATIVE NEGATIVE   Ketones, ur NEGATIVE NEGATIVE mg/dL   Protein, ur NEGATIVE NEGATIVE mg/dL   Nitrite NEGATIVE NEGATIVE    Leukocytes,Ua NEGATIVE NEGATIVE   DG Chest 2 View  Result Date: 06/07/2021 CLINICAL DATA:  Shortness of breath with lower extremity edema. EXAM: CHEST - 2 VIEW COMPARISON:  05/07/2007. FINDINGS: Lungs are hyperexpanded. The lungs are clear without focal pneumonia, edema, pneumothorax or pleural effusion. The cardio pericardial silhouette is enlarged. Bones are diffusely demineralized. Telemetry leads overlie the chest. IMPRESSION: Hyperexpansion without acute cardiopulmonary findings. Electronically Signed   By: Misty Stanley M.D.   On: 06/07/2021 13:20   CT HEAD WO CONTRAST (5MM)  Result Date: 06/07/2021 CLINICAL DATA:  Left eye vision problems EXAM: CT HEAD WITHOUT CONTRAST TECHNIQUE: Contiguous axial images were  obtained from the base of the skull through the vertex without intravenous contrast. RADIATION DOSE REDUCTION: This exam was performed according to the departmental dose-optimization program which includes automated exposure control, adjustment of the mA and/or kV according to patient size and/or use of iterative reconstruction technique. COMPARISON:  Hypo CT dated January 05, 2019 FINDINGS: Brain: Chronic white matter ischemic change. No evidence of acute infarction, hemorrhage, hydrocephalus, extra-axial collection or mass lesion/mass effect. Vascular: No hyperdense vessel or unexpected calcification. Skull: Normal. Negative for fracture or focal lesion. Sinuses/Orbits: No acute finding. Other: None. IMPRESSION: No acute intracranial abnormality. Electronically Signed   By: Yetta Glassman M.D.   On: 06/07/2021 13:37   MR BRAIN WO CONTRAST  Result Date: 06/07/2021 CLINICAL DATA:  Provided history: Neuro deficit, acute, stroke suspected. Additional history provided: Progressively worsening blurry vision, generalized weakness, left-sided gaze noted. EXAM: MRI HEAD WITHOUT CONTRAST TECHNIQUE: Multiplanar, multiecho pulse sequences of the brain and surrounding structures were obtained without  intravenous contrast. COMPARISON:  Head CT 06/07/2021. Report from brain MRI 07/08/2000 (images unavailable). FINDINGS: Brain: Mild-to-moderate generalized cerebral atrophy. Comparatively mild cerebellar atrophy. 5 mm focus of restricted diffusion within the midline dorsal pons compatible with acute infarct (series 3, image 13) (series 4, image 16). Mild-to-moderate multifocal T2 FLAIR hyperintense signal abnormality within the cerebral white matter, nonspecific but compatible chronic small vessel ischemic disease. Prominent perivascular space versus small chronic lacunar infarct within the left caudate head (series 10, image 17). Punctate chronic microhemorrhage within the left parietal lobe (series 8, image 15). Additional foci of T2* signal loss within the bilateral basal ganglia, which may reflect mineralization or additional punctate chronic microhemorrhages. No evidence of an intracranial mass. No extra-axial fluid collection. No midline shift. Vascular: Maintained flow voids within the proximal large arterial vessels. Skull and upper cervical spine: No focal suspicious marrow lesion. Incompletely assessed cervical spondylosis Sinuses/Orbits: Visualized orbits show no acute finding. Bilateral ocular lens replacements. Mild mucosal thickening within the bilateral ethmoid, sphenoid and maxillary sinuses. Other: Trace fluid within the bilateral mastoid air cells. IMPRESSION: 5 mm acute infarct within the midline dorsal pons. Mild-to-moderate chronic small vessel ischemic changes within the cerebral white matter. Mild-to-moderate generalized cerebral atrophy. Comparatively mild cerebellar atrophy. Mild paranasal sinus mucosal thickening. Trace fluid within the bilateral mastoid air cells. Electronically Signed   By: Kellie Simmering D.O.   On: 06/07/2021 15:32      Domenic Moras, PA-C 06/07/21 1743    Tegeler, Gwenyth Allegra, MD 06/07/21 1744

## 2021-06-07 NOTE — ED Notes (Signed)
Karma Lew has pt's wallet and keys [phone]

## 2021-06-07 NOTE — ED Notes (Signed)
Pt transported to MRI 

## 2021-06-08 ENCOUNTER — Other Ambulatory Visit: Payer: Self-pay

## 2021-06-08 ENCOUNTER — Observation Stay (HOSPITAL_COMMUNITY): Payer: No Typology Code available for payment source

## 2021-06-08 ENCOUNTER — Observation Stay (HOSPITAL_BASED_OUTPATIENT_CLINIC_OR_DEPARTMENT_OTHER): Payer: No Typology Code available for payment source

## 2021-06-08 DIAGNOSIS — I672 Cerebral atherosclerosis: Secondary | ICD-10-CM | POA: Diagnosis present

## 2021-06-08 DIAGNOSIS — R4701 Aphasia: Secondary | ICD-10-CM | POA: Diagnosis present

## 2021-06-08 DIAGNOSIS — Z79899 Other long term (current) drug therapy: Secondary | ICD-10-CM | POA: Diagnosis not present

## 2021-06-08 DIAGNOSIS — R1313 Dysphagia, pharyngeal phase: Secondary | ICD-10-CM | POA: Diagnosis present

## 2021-06-08 DIAGNOSIS — Z8249 Family history of ischemic heart disease and other diseases of the circulatory system: Secondary | ICD-10-CM | POA: Diagnosis not present

## 2021-06-08 DIAGNOSIS — R413 Other amnesia: Secondary | ICD-10-CM | POA: Diagnosis present

## 2021-06-08 DIAGNOSIS — R531 Weakness: Secondary | ICD-10-CM | POA: Diagnosis present

## 2021-06-08 DIAGNOSIS — E876 Hypokalemia: Secondary | ICD-10-CM | POA: Diagnosis not present

## 2021-06-08 DIAGNOSIS — N184 Chronic kidney disease, stage 4 (severe): Secondary | ICD-10-CM | POA: Diagnosis present

## 2021-06-08 DIAGNOSIS — I480 Paroxysmal atrial fibrillation: Secondary | ICD-10-CM | POA: Diagnosis present

## 2021-06-08 DIAGNOSIS — I6312 Cerebral infarction due to embolism of basilar artery: Secondary | ICD-10-CM | POA: Diagnosis not present

## 2021-06-08 DIAGNOSIS — M109 Gout, unspecified: Secondary | ICD-10-CM | POA: Diagnosis present

## 2021-06-08 DIAGNOSIS — H510 Palsy (spasm) of conjugate gaze: Secondary | ICD-10-CM | POA: Diagnosis present

## 2021-06-08 DIAGNOSIS — I639 Cerebral infarction, unspecified: Secondary | ICD-10-CM

## 2021-06-08 DIAGNOSIS — I6381 Other cerebral infarction due to occlusion or stenosis of small artery: Secondary | ICD-10-CM | POA: Diagnosis present

## 2021-06-08 DIAGNOSIS — I129 Hypertensive chronic kidney disease with stage 1 through stage 4 chronic kidney disease, or unspecified chronic kidney disease: Secondary | ICD-10-CM | POA: Diagnosis present

## 2021-06-08 DIAGNOSIS — T148XXA Other injury of unspecified body region, initial encounter: Secondary | ICD-10-CM | POA: Diagnosis present

## 2021-06-08 DIAGNOSIS — Z96651 Presence of right artificial knee joint: Secondary | ICD-10-CM | POA: Diagnosis present

## 2021-06-08 DIAGNOSIS — E11311 Type 2 diabetes mellitus with unspecified diabetic retinopathy with macular edema: Secondary | ICD-10-CM | POA: Diagnosis present

## 2021-06-08 DIAGNOSIS — E119 Type 2 diabetes mellitus without complications: Secondary | ICD-10-CM | POA: Diagnosis not present

## 2021-06-08 DIAGNOSIS — Z66 Do not resuscitate: Secondary | ICD-10-CM | POA: Diagnosis not present

## 2021-06-08 DIAGNOSIS — Z87891 Personal history of nicotine dependence: Secondary | ICD-10-CM | POA: Diagnosis not present

## 2021-06-08 DIAGNOSIS — I10 Essential (primary) hypertension: Secondary | ICD-10-CM | POA: Diagnosis not present

## 2021-06-08 DIAGNOSIS — S80821A Blister (nonthermal), right lower leg, initial encounter: Secondary | ICD-10-CM | POA: Diagnosis present

## 2021-06-08 DIAGNOSIS — E1122 Type 2 diabetes mellitus with diabetic chronic kidney disease: Secondary | ICD-10-CM | POA: Diagnosis present

## 2021-06-08 DIAGNOSIS — I6302 Cerebral infarction due to thrombosis of basilar artery: Secondary | ICD-10-CM | POA: Diagnosis not present

## 2021-06-08 DIAGNOSIS — I6389 Other cerebral infarction: Secondary | ICD-10-CM

## 2021-06-08 DIAGNOSIS — R600 Localized edema: Secondary | ICD-10-CM | POA: Diagnosis present

## 2021-06-08 DIAGNOSIS — R2689 Other abnormalities of gait and mobility: Secondary | ICD-10-CM | POA: Diagnosis present

## 2021-06-08 DIAGNOSIS — M1990 Unspecified osteoarthritis, unspecified site: Secondary | ICD-10-CM | POA: Diagnosis present

## 2021-06-08 DIAGNOSIS — Z20822 Contact with and (suspected) exposure to covid-19: Secondary | ICD-10-CM | POA: Diagnosis present

## 2021-06-08 LAB — GLUCOSE, CAPILLARY
Glucose-Capillary: 108 mg/dL — ABNORMAL HIGH (ref 70–99)
Glucose-Capillary: 119 mg/dL — ABNORMAL HIGH (ref 70–99)

## 2021-06-08 LAB — TSH: TSH: 1.253 u[IU]/mL (ref 0.350–4.500)

## 2021-06-08 LAB — URINE CULTURE: Culture: NO GROWTH

## 2021-06-08 LAB — BASIC METABOLIC PANEL
Anion gap: 16 — ABNORMAL HIGH (ref 5–15)
BUN: 23 mg/dL (ref 8–23)
CO2: 25 mmol/L (ref 22–32)
Calcium: 10.2 mg/dL (ref 8.9–10.3)
Chloride: 104 mmol/L (ref 98–111)
Creatinine, Ser: 1.58 mg/dL — ABNORMAL HIGH (ref 0.44–1.00)
GFR, Estimated: 30 mL/min — ABNORMAL LOW (ref >60)
Glucose, Bld: 97 mg/dL (ref 70–99)
Potassium: 3.2 mmol/L — ABNORMAL LOW (ref 3.5–5.1)
Sodium: 145 mmol/L (ref 135–145)

## 2021-06-08 LAB — HEMOGLOBIN A1C
Hgb A1c MFr Bld: 6.3 % — ABNORMAL HIGH (ref 4.8–5.6)
Mean Plasma Glucose: 134 mg/dL

## 2021-06-08 LAB — ECHOCARDIOGRAM COMPLETE
AR max vel: 2.3 cm2
AV Peak grad: 5.1 mmHg
Ao pk vel: 1.13 m/s
Area-P 1/2: 1.93 cm2
Height: 67 in
MV M vel: 6.61 m/s
MV Peak grad: 174.8 mmHg
P 1/2 time: 454 msec
S' Lateral: 2.5 cm
Weight: 1971.79 oz

## 2021-06-08 LAB — CBC
HCT: 41.2 % (ref 36.0–46.0)
Hemoglobin: 13.9 g/dL (ref 12.0–15.0)
MCH: 30.7 pg (ref 26.0–34.0)
MCHC: 33.7 g/dL (ref 30.0–36.0)
MCV: 90.9 fL (ref 80.0–100.0)
Platelets: 209 10*3/uL (ref 150–400)
RBC: 4.53 MIL/uL (ref 3.87–5.11)
RDW: 15.5 % (ref 11.5–15.5)
WBC: 6.2 10*3/uL (ref 4.0–10.5)
nRBC: 0 % (ref 0.0–0.2)

## 2021-06-08 LAB — LIPID PANEL
Cholesterol: 150 mg/dL (ref 0–200)
HDL: 76 mg/dL (ref >40)
LDL Cholesterol: 67 mg/dL (ref 0–99)
Total CHOL/HDL Ratio: 2 RATIO
Triglycerides: 36 mg/dL (ref <150)
VLDL: 7 mg/dL (ref 0–40)

## 2021-06-08 MED ORDER — METOPROLOL TARTRATE 25 MG PO TABS
25.0000 mg | ORAL_TABLET | Freq: Two times a day (BID) | ORAL | Status: DC
  Administered 2021-06-08 – 2021-06-12 (×8): 25 mg via ORAL
  Filled 2021-06-08 (×9): qty 1

## 2021-06-08 MED ORDER — POTASSIUM CHLORIDE 20 MEQ PO PACK
40.0000 meq | PACK | Freq: Once | ORAL | Status: DC
Start: 2021-06-08 — End: 2021-06-09
  Filled 2021-06-08: qty 2

## 2021-06-08 MED ORDER — LIP MEDEX EX OINT
TOPICAL_OINTMENT | CUTANEOUS | Status: DC | PRN
  Filled 2021-06-08: qty 7

## 2021-06-08 MED ORDER — ASPIRIN EC 81 MG PO TBEC
81.0000 mg | DELAYED_RELEASE_TABLET | Freq: Every day | ORAL | Status: DC
  Administered 2021-06-09 – 2021-06-10 (×2): 81 mg via ORAL
  Filled 2021-06-08 (×3): qty 1

## 2021-06-08 NOTE — Hospital Course (Addendum)
88 y.o. female with medical history significant of CKD, T2DM, HTN, gout, hx of TIA, hx of VF who presented to ED with several week history of weakness and vision changes that started on the morning of admission.  Also had a headache.  MRI brain showed an acute infarct.  Patient was hospitalized for further management.  Patient also has a history of aphasia which is at baseline.  Patient was hospitalized and seen by neurology.  She was also found to have atrial fibrillation which is a new diagnosis for her.  Started on Eliquis after discussions with patient and her family.

## 2021-06-08 NOTE — Evaluation (Signed)
Occupational Therapy Evaluation Patient Details Name: Mia [name] MRN: [id number] DOB: [birth year] Today's Date: 06/08/2021   History of Present Illness Pt is 88 yo female who presented to ED with several week history of weakness and vision changes that started on the morning of admission.  Also had a headache.  MRI brain showed an acute infarct. PMH includes CKD, T2DM, HTN, gout, hx of TIA, hx of VF.   Clinical Impression   Pt admitted with the above diagnoses and presents with below problem list. Pt will benefit from continued acute OT to address the below listed deficits and maximize independence with basic ADLs prior to d/c to venue below. At baseline, pt was living in an ALF, utilized cane vs walker, no physical assist with bathing/dressing, able to walk to dining hall. Today pt presents with impaired vision, decreased activity tolerance, impaired cognition and balance deficits impacting ADLs. Pt currently needs up to mod physical assist with UB ADLs, up to max A with LB ADLs. With great difficulty pt walked to/from bathroom to toilet. Max directional cues and hand over hand assist. On approach back to bed pt c/o nausea and increased dizziness. Increased assistance to steady and max A for 1 LOB. VSS assessed once in bed and stable. Nursing notified. After a few minutes in supine pt reported feeling better. Recommend staff utilize [facility] for toileting needs for now.        Recommendations for follow up therapy are one component of a multi-disciplinary discharge planning process, led by the attending physician.  Recommendations may be updated based on patient status, additional functional criteria and insurance authorization.   Follow Up Recommendations  Skilled nursing-short term rehab (<3 hours/day)    Assistance Recommended at Discharge Frequent or constant Supervision/Assistance  Patient can return home with the following      Functional Status Assessment  Patient has had a recent decline  in their functional status and demonstrates the ability to make significant improvements in function in a reasonable and predictable amount of time.  Equipment Recommendations  Other (comment) (defer to next venue)    Recommendations for Other Services PT consult     Precautions / Restrictions Precautions Precautions: Fall Precaution Comments: high fall risk Restrictions Weight Bearing Restrictions: No      Mobility Bed Mobility Overal bed mobility: Needs Assistance Bed Mobility: Supine to Sit, Sit to Supine     Supine to sit: Min assist, HOB elevated Sit to supine: Min assist   General bed mobility comments: sequencing cues needed. step by step cueing and reminders of task at hand.    Transfers Overall transfer level: Needs assistance Equipment used: Rolling walker (2 wheels), 1 person hand held assist Transfers: Sit to/from Stand Sit to Stand: Mod assist           General transfer comment: mod A to stand from regular height seat surface. multimodal cues for sequencing and to compensatory strategies for vision impairment      Balance Overall balance assessment: Needs assistance Sitting-balance support: Feet supported, No upper extremity supported Sitting balance-Leahy Scale: Fair     Standing balance support: Bilateral upper extremity supported, During functional activity Standing balance-Leahy Scale: Poor Standing balance comment: seeks bilateral UE support in dynamic standing                           ADL either performed or assessed with clinical judgement   ADL Overall ADL's : Needs assistance/impaired Eating/Feeding: Set up;Minimal assistance;Cueing  for compensatory techinques;Cueing for sequencing;Sitting   Grooming: Moderate assistance;Sitting;Cueing for compensatory techniques;Cueing for sequencing   Upper Body Bathing: Moderate assistance;Sitting;Cueing for compensatory techniques;Cueing for sequencing;Cueing for safety   Lower Body  Bathing: Sit to/from stand;Moderate assistance   Upper Body Dressing : Moderate assistance;Cueing for compensatory techniques;Cueing for safety;Cueing for sequencing   Lower Body Dressing: Sit to/from stand;Moderate assistance   Toilet Transfer: Moderate assistance;Maximal assistance;Ambulation;Comfort height toilet;Grab bars;Rolling walker (2 wheels)   Toileting- Clothing Manipulation and Hygiene: Moderate assistance;Sit to/from stand       Functional mobility during ADLs: Moderate assistance;Maximal assistance;Rolling walker (2 wheels) General ADL Comments: Max directional and sequencing cues. Physical assist to manage rw and approach to sitting surfaces. Vision, cogntion, dizziness, and weakness impacting ADLs.     Vision Baseline Vision/History: 1 Wears glasses Patient Visual Report: Blurring of vision;Diplopia Vision Assessment?: Yes Eye Alignment: Impaired (comment) Ocular Range of Motion: Other (comment) (left eye lateral gave palsy) Alignment/Gaze Preference: Chin down Tracking/Visual Pursuits: Requires cues, head turns, or add eye shifts to track;Impaired - to be further tested in functional context;Unable to hold eye position out of midline;Left eye does not track medially;Decreased smoothness of horizontal tracking Saccades: Impaired - to be further tested in functional context Convergence: Impaired - to be further tested in functional context Visual Fields: Impaired-to be further tested in functional context Diplopia Assessment: Disappears with one eye closed;Present in primary gaze Depth Perception: Undershoots Additional Comments: vision assessment limited by impaired cognition. Pt with max difficulty navigating in the room undershooting to reach for DME, sink, etc. Hand over hand to find walker hand bars. Blurred vision? difficulty with horizontal tracking in both eyes. Left lateral gaze palsy     Perception     Praxis      Pertinent Vitals/Pain Pain  Assessment Pain Assessment: Faces Faces Pain Scale: Hurts a little bit Pain Location: R foot Pain Descriptors / Indicators: Discomfort Pain Intervention(s): Monitored during session, Limited activity within patient's tolerance, Repositioned     Hand Dominance     Extremity/Trunk Assessment Upper Extremity Assessment Upper Extremity Assessment: Generalized weakness   Lower Extremity Assessment Lower Extremity Assessment: Defer to PT evaluation       Communication Communication Communication: Expressive difficulties (h/o aphasia at baseline)   Cognition Arousal/Alertness: Awake/alert Behavior During Therapy: Flat affect, Anxious Overall Cognitive Status: No family/caregiver present to determine baseline cognitive functioning Area of Impairment: Orientation, Attention, Memory, Following commands, Safety/judgement, Awareness, Problem solving                 Orientation Level: Place, Time, Situation Current Attention Level: Focused Memory: Decreased short-term memory, Decreased recall of precautions Following Commands: Follows one step commands inconsistently, Follows one step commands with increased time Safety/Judgement: Decreased awareness of safety, Decreased awareness of deficits Awareness: Intellectual Problem Solving: Slow processing, Decreased initiation, Difficulty sequencing, Requires verbal cues, Requires tactile cues       General Comments  + increased dizziness, nausea, and feeling poorly walking back to bed from bathroom. Increased speed and decreased safety awareness on approach back to bed.    Exercises     Shoulder Instructions      Home Living Family/patient expects to be discharged to:: Private residence Living Arrangements: Alone   Type of Home: Assisted living                                  Prior Functioning/Environment Prior Level of Function : Needs  assist       Physical Assist : Mobility (physical)     Mobility  Comments: cane vs. walker ADLs Comments: pt reports no physical assist needed with basic ADLs, walks to dining hall        OT Problem List: Decreased strength;Decreased activity tolerance;Impaired balance (sitting and/or standing);Impaired vision/perception;Decreased safety awareness;Decreased cognition;Decreased knowledge of use of DME or AE;Decreased knowledge of precautions      OT Treatment/Interventions: Self-care/ADL training;DME and/or AE instruction;Therapeutic exercise;Therapeutic activities;Cognitive remediation/compensation;Visual/perceptual remediation/compensation;Patient/family education;Balance training;Other (comment) (occlusion)    OT Goals(Current goals can be found in the care plan section) Acute Rehab OT Goals Patient Stated Goal: feel better, see better, not fall OT Goal Formulation: With patient Time For Goal Achievement: 06/22/21 Potential to Achieve Goals: Good ADL Goals Pt Will Perform Grooming: sitting;with supervision;with set-up Pt Will Perform Upper Body Dressing: with set-up;with supervision;sitting Pt Will Perform Lower Body Dressing: with min assist;sit to/from stand Pt Will Transfer to Toilet: with min assist;ambulating Pt Will Perform Toileting - Clothing Manipulation and hygiene: with min assist;sit to/from stand Additional ADL Goal #1: Pt and caregiver will be independent with managing occlusion tape.  OT Frequency: Min 3X/week    Co-evaluation              AM-PAC OT "6 Clicks" Daily Activity     Outcome Measure Help from another person eating meals?: A Little Help from another person taking care of personal grooming?: A Little Help from another person toileting, which includes using toliet, bedpan, or urinal?: A Lot Help from another person bathing (including washing, rinsing, drying)?: A Lot Help from another person to put on and taking off regular upper body clothing?: A Lot Help from another person to put on and taking off regular lower  body clothing?: A Lot 6 Click Score: 14   End of Session Equipment Utilized During Treatment: Rolling walker (2 wheels);Other (comment) (vs +1 HHA and furniture support) Nurse Communication: Other (comment);Mobility status (Pt feeling poorly after walk to bathroom, increased nausea, dizziness. also discussed occlusion tape.)  Activity Tolerance: Patient limited by fatigue;Other (comment) (+ increased dizziness, nausea with OOB activity) Patient left: in bed;with call bell/phone within reach;with bed alarm set  OT Visit Diagnosis: Unsteadiness on feet (R26.81);Muscle weakness (generalized) (M62.81);Other abnormalities of gait and mobility (R26.89);Pain;Dizziness and giddiness (R42);Other symptoms and signs involving cognitive function;Cognitive communication deficit (R41.841);Other (comment);Other symptoms and signs involving the nervous system (R29.898) (diplopia)                Time: 6967-8938 OT Time Calculation (min): 73 min Charges:  OT General Charges $OT Visit: 1 Visit OT Evaluation $OT Eval Moderate Complexity: 1 Mod OT Treatments $Self Care/Home Management : 23-37 mins $Therapeutic Activity: 8-22 mins  Tyrone Schimke, OT Acute Rehabilitation Services Office: [phone]   Hortencia Pilar 06/08/2021, 2:40 PM

## 2021-06-08 NOTE — Progress Notes (Signed)
Modified Barium Swallow Progress Note  Patient Details  Name: Mia [name] MRN: [id number] Date of Birth: [birth year]  Today's Date: 06/08/2021  Modified Barium Swallow completed.  Full report located under Chart Review in the Imaging Section.  Brief recommendations include the following:  Clinical Impression  Pt presents with mild pharyngeal dysphagia characterized by reduced tongue base retraction, and a pharyngeal delay. She demonstrated vallecular residue, and penetration (PAS 2, 3) with thin liquids via straw. A single instance of aspiration (PAS 7) was noted with when pt consumed the 66mm barium tablet with thin liquids via straw. Aspirate originated from pyriform sinus residue when pt was leaning forward. However, no other instances of aspiration were noted. Amount of vallecular residue was reduced with a liquid wash. A chin tuck posture was effective in eliminating laryngeal invasion and reducing pharyngeal residue. Pt was elated by her perceived improvement in bolus movement with use of these strategies and she verbalized agreement with using them during meals. A dysphagia 3 diet with thin liquids is recommended with observance of swallowing precautions. SLP will continue to follow pt.   Swallow Evaluation Recommendations       SLP Diet Recommendations: Dysphagia 3 (Mech soft) solids;Thin liquid   Liquid Administration via: Cup;Straw (avoid consecutive swallows)   Medication Administration: Whole meds with puree   Supervision: Staff to assist with self feeding   Compensations: Slow rate;Small sips/bites;Chin tuck;Follow solids with liquid   Postural Changes: Seated upright at 90 degrees   Oral Care Recommendations: Oral care BID      Ginnette Gates I. Hardin Negus, [place], [place] Office number [phone] Pager [phone]   Horton Marshall 06/08/2021,2:51 PM

## 2021-06-08 NOTE — Evaluation (Signed)
Physical Therapy Evaluation Patient Details Name: Mia [name] MRN: [id number] DOB: 20-Mar-1926 Today's Date: 06/08/2021  History of Present Illness  Pt is 88 yo female who presented to ED with several week history of weakness and vision changes that started on the morning of admission.  Also had a headache.  MRI brain showed an acute infarct. PMH includes CKD, T2DM, HTN, gout, hx of TIA, hx of VF.  Clinical Impression  Patient presents with decreased mobility due to general weakness, decreased vision and decreased balance.  She was able to ambulate to the door and back with min to mod A with RW and mod cues for visual target.  She seems eager to improve, but feel will need extended rehab due to age and vision deficits; therefore, recommend STSNF level rehab at d/c.  PT will continue to follow acutely.        Recommendations for follow up therapy are one component of a multi-disciplinary discharge planning process, led by the attending physician.  Recommendations may be updated based on patient status, additional functional criteria and insurance authorization.  Follow Up Recommendations Skilled nursing-short term rehab (<3 hours/day)    Assistance Recommended at Discharge Frequent or constant Supervision/Assistance  Patient can return home with the following  A lot of help with walking and/or transfers;A lot of help with bathing/dressing/bathroom;Assistance with cooking/housework;Direct supervision/assist for medications management;Assist for transportation    Equipment Recommendations None recommended by PT  Recommendations for Other Services       Functional Status Assessment Patient has had a recent decline in their functional status and/or demonstrates limited ability to make significant improvements in function in a reasonable and predictable amount of time     Precautions / Restrictions Precautions Precautions: Fall Precaution Comments: visual deficits      Mobility  Bed  Mobility Overal bed mobility: Needs Assistance Bed Mobility: Supine to Sit     Supine to sit: Supervision, HOB elevated     General bed mobility comments: increased time with effort to scoot to EOB    Transfers Overall transfer level: Needs assistance Equipment used: Rolling walker (2 wheels) Transfers: Sit to/from Stand Sit to Stand: Min assist           General transfer comment: assist for balance up from EOB, increased time and multimodal cues to back up to chair and sit in recliner    Ambulation/Gait Ambulation/Gait assistance: Min assist, Mod assist Gait Distance (Feet): 25 Feet Assistive device: Rolling walker (2 wheels) Gait Pattern/deviations: Step-to pattern, Step-through pattern, Decreased stride length, Shuffle, Wide base of support, Trunk flexed       General Gait Details: guarded due to poor vision, improved with targeting on stationary easy to see target in front of her, increased time and some assist on turns  Stairs            Wheelchair Mobility    Modified Rankin (Stroke Patients Only) Modified Rankin (Stroke Patients Only) Pre-Morbid Rankin Score: Moderate disability Modified Rankin: Moderately severe disability     Balance Overall balance assessment: Needs assistance Sitting-balance support: Feet supported Sitting balance-Leahy Scale: Good Sitting balance - Comments: attempting to don socks, but too difficult with toe nails too long   Standing balance support: Bilateral upper extremity supported Standing balance-Leahy Scale: Poor Standing balance comment: UE support needed for balance                             Pertinent Vitals/Pain Pain Assessment  Faces Pain Scale: Hurts little more Pain Location: R leg Pain Descriptors / Indicators: Discomfort Pain Intervention(s): Monitored during session, Limited activity within patient's tolerance    Home Living Family/patient expects to be discharged to:: Private  residence Living Arrangements: Alone   Type of Home: Independent living facility         Home Layout: One level Home Equipment: Engineer, maintenance (IT) (2 wheels)      Prior Function Prior Level of Function : Needs assist             Mobility Comments: uses walker versus scooter per daughter       Hand Dominance        Extremity/Trunk Assessment   Upper Extremity Assessment Upper Extremity Assessment: Defer to OT evaluation    Lower Extremity Assessment Lower Extremity Assessment: Generalized weakness (mildly weaker on R)    Cervical / Trunk Assessment Cervical / Trunk Assessment: Kyphotic  Communication   Communication: Expressive difficulties (baseline aphasia)  Cognition Arousal/Alertness: Awake/alert Behavior During Therapy: Flat affect   Area of Impairment: Attention, Memory, Safety/judgement                   Current Attention Level: Selective Memory: Decreased short-term memory, Decreased recall of precautions   Safety/Judgement: Decreased awareness of deficits   Problem Solving: Slow processing          General Comments General comments (skin integrity, edema, etc.): daughter and son in law in the room trying to get information about pt but also giving history    Exercises     Assessment/Plan    PT Assessment Patient needs continued PT services  PT Problem List Decreased strength;Decreased mobility;Decreased balance;Impaired sensation;Decreased safety awareness;Decreased activity tolerance       PT Treatment Interventions DME instruction;Therapeutic activities;Gait training;Therapeutic exercise;Patient/family education;Balance training;Functional mobility training    PT Goals (Current goals can be found in the Care Plan section)  Acute Rehab PT Goals Patient Stated Goal: to go home PT Goal Formulation: With patient Time For Goal Achievement: 06/22/21 Potential to Achieve Goals: Fair    Frequency Min 4X/week      Co-evaluation               AM-PAC PT "6 Clicks" Mobility  Outcome Measure Help needed turning from your back to your side while in a flat bed without using bedrails?: A Little Help needed moving from lying on your back to sitting on the side of a flat bed without using bedrails?: A Little Help needed moving to and from a bed to a chair (including a wheelchair)?: A Little Help needed standing up from a chair using your arms (e.g., wheelchair or bedside chair)?: A Little Help needed to walk in hospital room?: A Lot Help needed climbing 3-5 steps with a railing? : Total 6 Click Score: 15    End of Session Equipment Utilized During Treatment: Gait belt Activity Tolerance: Patient tolerated treatment well Patient left: in chair;with call bell/phone within reach;with chair alarm set;with family/visitor present   PT Visit Diagnosis: Other abnormalities of gait and mobility (R26.89);Muscle weakness (generalized) (M62.81);Other symptoms and signs involving the nervous system (R29.898)    Time: 1540-1605 PT Time Calculation (min) (ACUTE ONLY): 25 min   Charges:   PT Evaluation $PT Eval Moderate Complexity: 1 Mod PT Treatments $Gait Training: 8-22 mins        Magda Kiel, PT Acute Rehabilitation Services Pager:[phone] Office:[phone] 06/08/2021   Reginia Naas 06/08/2021, 6:36 PM

## 2021-06-08 NOTE — Consult Note (Addendum)
Neurology Consultation Reason for Consult: Visual change Referring Physician: Eliberto Ivory, a  CC: Visual change  History is obtained from: Patient  HPI: Mia [name] is a 88 y.o. female who was in her normal state of health until she went to bed last night.  Upon awakening this morning, she noticed that she was having problems with "seeing out of her left eye."  It was noted that she had a gaze palsy.  She then came emergency department where there was concern for stroke and therefore an MRI was obtained which shows a small pontine stroke.   She denies any numbness, weakness, or other symptoms.   LKW: 1/18 prior to bed tpa given?: no, out of window   ROS: A 14 point ROS was performed and is negative except as noted in the HPI.   Past Medical History:  Diagnosis Date   Arthritis    oa   Chronic kidney disease    chronic kidney diseaase follow by primary md   Diabetes mellitus without complication ([place])    Diet controlled   Former smoker    H/O ventricular fibrillation 10 yrs ago dx   Hypertension    Prediabetes    Seasonal allergies    TIA (transient ischemic attack) none recent     Family History  Problem Relation Age of Onset   Congestive Heart Failure Mother    Alcohol abuse Father    Heart disease Son    Bipolar disorder Son    Lung cancer Brother    Post-traumatic stress disorder Daughter      Social History:  reports that she quit smoking about 20 years ago. Her smoking use included cigarettes. She has a 10.00 pack-year smoking history. She has never used smokeless tobacco. She reports current alcohol use of about 1.0 standard drink per week. She reports that she does not use drugs.   Exam: Current vital signs: BP (!) 164/75 (BP Location: Right Arm)    Pulse 85    Temp 98.1 F (36.7 C) (Oral)    Resp 16    Ht 5\' 7"  (1.702 m)    Wt 60.2 kg    SpO2 94%    BMI 20.79 kg/m  Vital signs in last 24 hours: Temp:  [97.8 F (36.6 C)-98.1 F (36.7 C)] 98.1 F (36.7 C)  (01/20 0005) Pulse Rate:  [66-96] 85 (01/20 0005) Resp:  [12-21] 16 (01/20 0005) BP: (156-185)/(66-95) 164/75 (01/20 0005) SpO2:  [94 %-100 %] 94 % (01/20 0005) Weight:  [60.2 kg-61.2 kg] 60.2 kg (01/19 2113)   Physical Exam  Constitutional: Appears well-developed and well-nourished.  Psych: Affect appropriate to situation Eyes: No scleral injection HENT: No OP obstruction MSK: no joint deformities.  Cardiovascular: Normal rate and regular rhythm.  Respiratory: Effort normal, non-labored breathing GI: Soft.  No distension. There is no tenderness.  Skin: WDI  Neuro: Mental Status: Patient is awake, alert, oriented to person, place, month, year, and situation. Patient is able to give a clear and coherent history. No signs of aphasia or neglect Cranial Nerves: II: Visual Fields are full when checked independently in each eye. Pupils are equal, round, and reactive to light.   III,IV, VI: She has a 1-1/2 syndrome with relatively preserved vertical gaze, the left eye is able to abduct V: Facial sensation is symmetric to temperature VII: Facial movement is symmetric.  VIII: hearing is intact to voice X: Uvula elevates symmetrically XI: Shoulder shrug is symmetric. XII: tongue is midline without atrophy or fasciculations.  Motor: Tone is normal. Bulk is normal. 5/5 strength was present in all four extremities.  Sensory: Sensation is symmetric to light touch and temperature in the arms and legs. Cerebellar: FNF and HKS are intact on the right,?  Mild impairment of the left   I have reviewed labs in epic and the results pertinent to this consultation are: Creatinine 1.69  I have reviewed the images obtained:MRI brain - small pontine stroke.   Impression: 88 year old female with small pontine stroke.  I suspect that this is likely due to small vessel disease.  Unfortunately, I suspect that her eye movement abnormality is going to be significantly troublesome, but we will need to  assess her for secondary stroke prevention.  Recommendations: - HgbA1c, fasting lipid panel - MRI of the brain without contrast - Frequent neuro checks - Echocardiogram - CTA head and neck - Prophylactic therapy-Antiplatelet med: Aspirin - dose 81mg  and plavix 75mg  daily for 3 weeks followed by Plavix monotherapy - Risk factor modification - Telemetry monitoring - PT consult, OT consult, Speech consult - Stroke team to follow    Roland Rack, MD [facility] [phone]  If 7pm- 7am, please page neurology on call as listed in [place].

## 2021-06-08 NOTE — Progress Notes (Addendum)
STROKE TEAM PROGRESS NOTE   INTERVAL HISTORY Patient is seen in her room with no family at the bedside.  Yesterday, when she woke up, she noticed that she was having diplopia and presented to the ED.  MRI revealed a small paramedian pontine stroke.  Carotid ultrasound ,transcranial Doppler and echocardiogram are all pending.  Vitals:   06/08/21 0424 06/08/21 0500 06/08/21 0804 06/08/21 1228  BP: (!) 164/74  (!) 171/102 (!) 166/86  Pulse: (!) 114  89 (!) 103  Resp: 16  17 17   Temp: 99.4 F (37.4 C)  97.7 F (36.5 C) 97.6 F (36.4 C)  TempSrc: Oral  Oral Oral  SpO2: 96%  98% 100%  Weight:  55.9 kg    Height:       CBC:  Recent Labs  Lab 06/07/21 1245 06/08/21 0256  WBC 5.4 6.2  NEUTROABS 4.6  --   HGB 13.0 13.9  HCT 40.0 41.2  MCV 93.2 90.9  PLT 217 196   Basic Metabolic Panel:  Recent Labs  Lab 06/07/21 1245 06/08/21 0256  NA 145 145  K 3.9 3.2*  CL 109 104  CO2 25 25  GLUCOSE 131* 97  BUN 26* 23  CREATININE 1.69* 1.58*  CALCIUM 9.7 10.2   Lipid Panel:  Recent Labs  Lab 06/08/21 0256  CHOL 150  TRIG 36  HDL 76  CHOLHDL 2.0  VLDL 7  LDLCALC 67   HgbA1c: No results for input(s): HGBA1C in the last 168 hours. Urine Drug Screen: No results for input(s): LABOPIA, COCAINSCRNUR, LABBENZ, AMPHETMU, THCU, LABBARB in the last 168 hours.  Alcohol Level No results for input(s): ETH in the last 168 hours.  IMAGING past 24 hours DG Chest 2 View  Result Date: 06/07/2021 CLINICAL DATA:  Shortness of breath with lower extremity edema. EXAM: CHEST - 2 VIEW COMPARISON:  05/07/2007. FINDINGS: Lungs are hyperexpanded. The lungs are clear without focal pneumonia, edema, pneumothorax or pleural effusion. The cardio pericardial silhouette is enlarged. Bones are diffusely demineralized. Telemetry leads overlie the chest. IMPRESSION: Hyperexpansion without acute cardiopulmonary findings. Electronically Signed   By: Misty Stanley M.D.   On: 06/07/2021 13:20   CT HEAD WO CONTRAST  (5MM)  Result Date: 06/07/2021 CLINICAL DATA:  Left eye vision problems EXAM: CT HEAD WITHOUT CONTRAST TECHNIQUE: Contiguous axial images were obtained from the base of the skull through the vertex without intravenous contrast. RADIATION DOSE REDUCTION: This exam was performed according to the departmental dose-optimization program which includes automated exposure control, adjustment of the mA and/or kV according to patient size and/or use of iterative reconstruction technique. COMPARISON:  Hypo CT dated January 05, 2019 FINDINGS: Brain: Chronic white matter ischemic change. No evidence of acute infarction, hemorrhage, hydrocephalus, extra-axial collection or mass lesion/mass effect. Vascular: No hyperdense vessel or unexpected calcification. Skull: Normal. Negative for fracture or focal lesion. Sinuses/Orbits: No acute finding. Other: None. IMPRESSION: No acute intracranial abnormality. Electronically Signed   By: Yetta Glassman M.D.   On: 06/07/2021 13:37   MR BRAIN WO CONTRAST  Result Date: 06/07/2021 CLINICAL DATA:  Provided history: Neuro deficit, acute, stroke suspected. Additional history provided: Progressively worsening blurry vision, generalized weakness, left-sided gaze noted. EXAM: MRI HEAD WITHOUT CONTRAST TECHNIQUE: Multiplanar, multiecho pulse sequences of the brain and surrounding structures were obtained without intravenous contrast. COMPARISON:  Head CT 06/07/2021. Report from brain MRI 07/08/2000 (images unavailable). FINDINGS: Brain: Mild-to-moderate generalized cerebral atrophy. Comparatively mild cerebellar atrophy. 5 mm focus of restricted diffusion within the midline dorsal pons  compatible with acute infarct (series 3, image 13) (series 4, image 16). Mild-to-moderate multifocal T2 FLAIR hyperintense signal abnormality within the cerebral white matter, nonspecific but compatible chronic small vessel ischemic disease. Prominent perivascular space versus small chronic lacunar infarct  within the left caudate head (series 10, image 17). Punctate chronic microhemorrhage within the left parietal lobe (series 8, image 15). Additional foci of T2* signal loss within the bilateral basal ganglia, which may reflect mineralization or additional punctate chronic microhemorrhages. No evidence of an intracranial mass. No extra-axial fluid collection. No midline shift. Vascular: Maintained flow voids within the proximal large arterial vessels. Skull and upper cervical spine: No focal suspicious marrow lesion. Incompletely assessed cervical spondylosis Sinuses/Orbits: Visualized orbits show no acute finding. Bilateral ocular lens replacements. Mild mucosal thickening within the bilateral ethmoid, sphenoid and maxillary sinuses. Other: Trace fluid within the bilateral mastoid air cells. IMPRESSION: 5 mm acute infarct within the midline dorsal pons. Mild-to-moderate chronic small vessel ischemic changes within the cerebral white matter. Mild-to-moderate generalized cerebral atrophy. Comparatively mild cerebellar atrophy. Mild paranasal sinus mucosal thickening. Trace fluid within the bilateral mastoid air cells. Electronically Signed   By: Kellie Simmering D.O.   On: 06/07/2021 15:32   ECHOCARDIOGRAM COMPLETE  Result Date: 06/08/2021    ECHOCARDIOGRAM REPORT   Patient Name:   Candyce Cottam Date of Exam: 06/08/2021 Medical Rec #:  [id number]    Height:       67.0 in Accession #:    [id number]   Weight:       123.2 lb Date of Birth:  08/16/1925     BSA:          1.646 m Patient Age:    88 years     BP:           171/102 mmHg Patient Gender: F            HR:           79 bpm. Exam Location:  Inpatient Procedure: 2D Echo Indications:    Stroke  History:        Patient has prior history of Echocardiogram examinations, most                 recent 12/20/2005. Risk Factors:Hypertension and Diabetes.  Sonographer:    Jefferey Pica Referring Phys: [id number] [place]  1. Left ventricular ejection fraction, by  estimation, is 60 to 65%. The left ventricle has normal function. The left ventricle has no regional wall motion abnormalities. There is mild concentric left ventricular hypertrophy of the basal segme nt No LVOT  obstruction  2. Left atrial size was severely dilated.  3. Right atrial size was moderately dilated.  4. Mild mitral valve regurgitation.  5. The aortic valve is grossly normal. Aortic valve regurgitation is trivial.  6. Right ventricular systolic function is normal. The right ventricular size is normal.  7. The inferior vena cava is normal in size with greater than 50% respiratory variability, suggesting right atrial pressure of 3 mmHg. Comparison(s): No prior Echocardiogram. FINDINGS  Left Ventricle: Left ventricular ejection fraction, by estimation, is 60 to 65%. The left ventricle has normal function. The left ventricle has no regional wall motion abnormalities. The left ventricular internal cavity size was normal in size. There is  mild concentric left ventricular hypertrophy of the basal segment. Left ventricular diastolic parameters are consistent with Grade I diastolic dysfunction (impaired relaxation). Right Ventricle: The right ventricular size is normal. No increase in right ventricular wall thickness.  Right ventricular systolic function is normal. The tricuspid regurgitant velocity is 2.28 m/s, and with an assumed right atrial pressure of 5 mmHg, the estimated right ventricular systolic pressure is 27.0 mmHg. Left Atrium: Left atrial size was severely dilated. Right Atrium: Right atrial size was moderately dilated. Pericardium: There is no evidence of pericardial effusion. Mitral Valve: There is moderate calcification of the mitral valve leaflet(s). Mild mitral valve regurgitation. Tricuspid Valve: The tricuspid valve is grossly normal. Tricuspid valve regurgitation is trivial. Aortic Valve: The aortic valve is grossly normal. Aortic valve regurgitation is trivial. Aortic regurgitation PHT  measures 454 msec. Aortic valve peak gradient measures 5.1 mmHg. Pulmonic Valve: The pulmonic valve was not well visualized. Pulmonic valve regurgitation is not visualized. Aorta: The aortic root and ascending aorta are structurally normal, with no evidence of dilitation. Venous: The inferior vena cava is normal in size with greater than 50% respiratory variability, suggesting right atrial pressure of 3 mmHg. IAS/Shunts: No atrial level shunt detected by color flow Doppler.  LEFT VENTRICLE PLAX 2D LVIDd:         4.00 cm   Diastology LVIDs:         2.50 cm   LV e' lateral:   3.65 cm/s LV PW:         1.00 cm   LV E/e' lateral: 23.6 LV IVS:        1.00 cm LVOT diam:     1.90 cm LV SV:         47 LV SV Index:   28 LVOT Area:     2.84 cm  IVC IVC diam: 1.30 cm LEFT ATRIUM             Index        RIGHT ATRIUM           Index LA diam:        3.40 cm 2.07 cm/m   RA Area:     13.40 cm LA Vol (A2C):   93.7 ml 56.92 ml/m  RA Volume:   34.30 ml  20.84 ml/m LA Vol (A4C):   47.3 ml 28.73 ml/m LA Biplane Vol: 73.9 ml 44.89 ml/m  AORTIC VALVE                 PULMONIC VALVE AV Area (Vmax): 2.30 cm     PV Vmax:       0.78 m/s AV Vmax:        113.40 cm/s  PV Peak grad:  2.4 mmHg AV Peak Grad:   5.1 mmHg LVOT Vmax:      91.95 cm/s LVOT Vmean:     54.600 cm/s LVOT VTI:       0.165 m AI PHT:         454 msec  AORTA Ao Root diam: 3.10 cm Ao Asc diam:  3.00 cm MITRAL VALVE                TRICUSPID VALVE MV Area (PHT): 1.93 cm     TR Peak grad:   20.8 mmHg MV Decel Time: 393 msec     TR Vmax:        228.00 cm/s MR Peak grad: 174.8 mmHg MR Vmax:      661.00 cm/s   SHUNTS MV E velocity: 86.30 cm/s   Systemic VTI:  0.17 m MV A velocity: 133.00 cm/s  Systemic Diam: 1.90 cm MV E/A ratio:  0.65 Landscape architect signed by Phineas Inches Signature Date/Time: 06/08/2021/11:24:26 AM  Final    VAS US CAROTID  Result Date: 06/08/2021 Carotid Arterial Duplex Study Patient Name:  Daylin Davies  Date of Exam:   06/08/2021 Medical Rec  #: [id number]     Accession #:    [id number] Date of Birth: 11-03-1925      Patient Gender: F Patient Age:   31 years Exam Location:  [facility] Procedure:      VAS US CAROTID Referring Phys: Zahki Hoogendoorn --------------------------------------------------------------------------------  Indications:  CVA. Risk Factors: Hypertension, Diabetes, past history of smoking. Limitations   Today's exam was limited due to the high bifurcation of the               carotid. Performing Technologist: Darlin Coco RDMS, RVT  Examination Guidelines: A complete evaluation includes B-mode imaging, spectral Doppler, color Doppler, and power Doppler as needed of all accessible portions of each vessel. Bilateral testing is considered an integral part of a complete examination. Limited examinations for reoccurring indications may be performed as noted.  Right Carotid Findings: +----------+--------+--------+--------+------------------+--------+             PSV cm/s EDV cm/s Stenosis Plaque Description Comments  +----------+--------+--------+--------+------------------+--------+  CCA Prox   48       9                                              +----------+--------+--------+--------+------------------+--------+  CCA Distal 44       12                                             +----------+--------+--------+--------+------------------+--------+  ICA Prox   54       17       1-39%    heterogenous                 +----------+--------+--------+--------+------------------+--------+  ICA Distal 98       34                                             +----------+--------+--------+--------+------------------+--------+  ECA        56                                                      +----------+--------+--------+--------+------------------+--------+ +----------+--------+-------+----------------+-------------------+             PSV cm/s EDV cms Describe         Arm Pressure (mmHG)   +----------+--------+-------+----------------+-------------------+  Subclavian 102              Multiphasic, WNL                      +----------+--------+-------+----------------+-------------------+ +---------+--------+--+--------+--+----------+  Vertebral PSV cm/s 44 EDV cm/s 20 Retrograde  +---------+--------+--+--------+--+----------+  Left Carotid Findings: +----------+--------+--------+--------+------------------+------------------+             PSV cm/s EDV cm/s Stenosis Plaque Description Comments            +----------+--------+--------+--------+------------------+------------------+  CCA Prox  60       10                                                       +----------+--------+--------+--------+------------------+------------------+  CCA Distal 49       10                                                       +----------+--------+--------+--------+------------------+------------------+  ICA Prox   93       30                                   intimal thickening  +----------+--------+--------+--------+------------------+------------------+  ICA Distal 82       23                                                       +----------+--------+--------+--------+------------------+------------------+  ECA        65                                                                +----------+--------+--------+--------+------------------+------------------+ +----------+--------+--------+----------------+-------------------+             PSV cm/s EDV cm/s Describe         Arm Pressure (mmHG)  +----------+--------+--------+----------------+-------------------+  Subclavian 157               Multiphasic, WNL                      +----------+--------+--------+----------------+-------------------+ +---------+--------+--------+--------------+  Vertebral PSV cm/s EDV cm/s Not identified  +---------+--------+--------+--------------+ No recent prior studies.  Summary: Right Carotid: Velocities in the right ICA are consistent  with a 1-39% stenosis. Left Carotid: The extracranial vessels were near-normal with only minimal wall               thickening or plaque. Vertebrals:  Right vertebral artery demonstrates retrograde flow. Left vertebral              artery was not visualized. Subclavians: Normal flow hemodynamics were seen in bilateral subclavian              arteries. *See table(s) above for measurements and observations.     Preliminary    VAS [place] TRANSCRANIAL DOPPLER  Result Date: 06/08/2021  Transcranial Doppler Patient Name:  Caral Mraz  Date of Exam:   06/08/2021 Medical Rec #: [id number]     Accession #:    [id number] Date of Birth: Jul 05, 1925      Patient Gender: F Patient Age:   33 years Exam Location:  [facility] Procedure:      VAS [place] TRANSCRANIAL DOPPLER Referring Phys: Kahil Agner --------------------------------------------------------------------------------  Indications: Stroke. Limitations for diagnostic windows: Unable  to insonate right transtemporal window. Unable to insonate left transtemporal window. Comparison Study: No prior studies. Performing Technologist: Darlin Coco RDMS, RVT  Examination Guidelines: A complete evaluation includes B-mode imaging, spectral Doppler, color Doppler, and power Doppler as needed of all accessible portions of each vessel. Bilateral testing is considered an integral part of a complete examination. Limited examinations for reoccurring indications may be performed as noted.  +----------+-------------+----------+-----------+------------------+  RIGHT TCD  Right VM (cm) Depth (cm) Pulsatility      Comment        +----------+-------------+----------+-----------+------------------+  MCA                                             Unable to insonate  +----------+-------------+----------+-----------+------------------+  ACA                                             Unable to insonate  +----------+-------------+----------+-----------+------------------+  Term ICA                                         Unable to insonate  +----------+-------------+----------+-----------+------------------+  PCA                                             Unable to insonate  +----------+-------------+----------+-----------+------------------+  Opthalmic      17.00                   1.17                         +----------+-------------+----------+-----------+------------------+  ICA siphon     40.00                   1.13                         +----------+-------------+----------+-----------+------------------+  Vertebral      10.00                   0.87                         +----------+-------------+----------+-----------+------------------+  +----------+------------+----------+-----------+------------------+  LEFT TCD   Left VM (cm) Depth (cm) Pulsatility      Comment        +----------+------------+----------+-----------+------------------+  MCA                                            Unable to insonate  +----------+------------+----------+-----------+------------------+  ACA                                            Unable to insonate  +----------+------------+----------+-----------+------------------+  Term ICA  Unable to insonate  +----------+------------+----------+-----------+------------------+  PCA                                            Unable to insonate  +----------+------------+----------+-----------+------------------+  Opthalmic     16.00                   1.36                         +----------+------------+----------+-----------+------------------+  ICA siphon    20.00                   1.80                         +----------+------------+----------+-----------+------------------+  Vertebral     -14.00                  1.25                         +----------+------------+----------+-----------+------------------+  +------------+------+-------+               VM cm  Comment  +------------+------+-------+  Prox Basilar -17.00           +------------+------+-------+    Preliminary     PHYSICAL EXAM General:  Alert, thin appearing female patient in no acute distress   NEURO:  Mental Status: AA&Ox3  Speech/Language: speech is without dysarthria or aphasia.    Cranial Nerves:  II: PERRL. Visual fields full.  III, IV, VI: EOMI shows a 1-1/2 syndrome.  Complete left gaze horizontal palsy.  Left eye is not able to move nasally and right eye is able to AB duct but shows nystagmus.  No vertical gaze limitation.  Patient has binocular diplopia in the horizontal plane. V: Sensation is intact to light touch and symmetrical to face.  VII: Smile is symmetrical.  VIII: hearing intact to voice. IX, X: Palate elevates symmetrically. Phonation is normal.  XII: tongue is midline without fasciculations. Motor: 5/5 strength to all muscle groups tested.  Sensation- Intact to light touch bilaterally.  Coordination: FTN intact bilaterally.  No drift.  Gait- deferred   ASSESSMENT/PLAN Mia [name] is a 88 y.o. female with history of CKD, T2DM, HTN, gout, TIA and VF presenting with diplopia upon awakening yesterday.  She then and presented to the ED.  MRI revealed a small pontine stroke.  Exam findings reveal one and a half syndrome with no horizontal movement of the right eye.  Plan is to have OT provide patient with a patch or glasses to control diplopia.  Stroke:   Paramedian pontine infarct  likely secondary  due to small vessel disease CT head No acute abnormality.  MRI  3mm acute infarct in midline dorsal pons MRA  pending Carotid Doppler  1-39% stenosis in right carotid, near normal left carotid, retrograde flow in right vertebral artery 2D Echo EF 60-65%, mild LVH,  severely dilated left atrium, no atrial level shunt Transcranial doppler unable to insonate bilateral MCA, ACA and PCA due to poor windows LDL 67 HgbA1c 6.1 VTE prophylaxis - lovenox    Diet   Diet Carb Modified Fluid consistency: Nectar Thick; Room service  appropriate? Yes   clopidogrel 75 mg daily prior to admission, now on aspirin 81 mg daily  and clopidogrel 75 mg daily.x 3 weeks and then aspirin alone Therapy recommendations:  pending Disposition:  pending  Hypertension Home meds:  HCTZ 12.5 mg every other day Stable Permissive hypertension (OK if < 220/120) but gradually normalize in 5-7 days Long-term BP goal normotensive  Hyperlipidemia Home meds:  none LDL 67, goal < 70 High intensity statin not indicated as LDL below goal Continue statin at discharge  Diabetes type II Controlled Home meds:  none HgbA1c 6.1, goal < 7.0 CBGs Recent Labs    06/07/21 2243 06/08/21 0605  GLUCAP 92 108*    SSI  Other Stroke Risk Factors Advanced Age >/= 52  Former cigarette smoker Hx TIA  Other Active Problems none  Hospital day # [employer] , MSN, AGACNP-BC [facility] See Amion for schedule and pager information 06/08/2021 1:22 PM  STROKE MD NOTE : I have personally obtained history,examined this patient, reviewed notes, independently viewed imaging studies, participated in medical decision making and plan of care.ROS completed by me personally and pertinent positives fully documented  I have made any additions or clarifications directly to the above note. Agree with note above.  Patient presented with sudden onset of diplopia due to paramedian pontine lacunar infarct from small vessel disease.  Recommend aspirin Plavix for 3 weeks followed by aspirin alone and aggressive risk factor modification.  Occupational Therapy for eyepatch to compensate for diplopia.  Mobilize out of bed.  Therapy consults.  Discussed with patient and Dr. [place] Pink.  Greater than 50% time during this 50-minute visit was spent in counseling and coordination of care about her lacunar stroke and discussion about diplopia and treatment and stroke prevention precautions.  Antony Contras, MD Medical Director [facility] Pager:  [phone] 06/08/2021 2:12 PM     To contact Stroke Continuity provider, please refer to [email or web address] After hours, contact General Neurology

## 2021-06-08 NOTE — Evaluation (Signed)
Clinical/Bedside Swallow Evaluation Patient Details  Name: Mia [name] MRN: [id number] Date of Birth: Jun 15, 1925  Today's Date: 06/08/2021 Time: SLP Start Time (ACUTE ONLY): 3614 SLP Stop Time (ACUTE ONLY): 0904 SLP Time Calculation (min) (ACUTE ONLY): 10 min  Past Medical History:  Past Medical History:  Diagnosis Date   Arthritis    oa   Chronic kidney disease    chronic kidney diseaase follow by primary md   Diabetes mellitus without complication ([place])    Diet controlled   Former smoker    H/O ventricular fibrillation 10 yrs ago dx   Hypertension    Prediabetes    Seasonal allergies    TIA (transient ischemic attack) none recent   Past Surgical History:  Past Surgical History:  Procedure Laterality Date   NM MYOCAR PERF WALL MOTION  01/2008   lexiscan myoview - normal pattern of perfusion in all regions, EF 92%, no significant ischemia demonstated   TONSILLECTOMY  age 27   and adenoids   TOTAL KNEE ARTHROPLASTY Right 10/06/2015   Procedure: RIGHT TOTAL KNEE ARTHROPLASTY;  Surgeon: [name], MD;  Location: [facility];  Service: Orthopedics;  Laterality: Right;   TRANSTHORACIC ECHOCARDIOGRAM  12/2005   mild DUST, EF=>55%; mild mitral annular calcif; mild-mod aortic root calcif   HPI:  [name] who presented to the ED with vision changes and several week history of weakness. MRI brain 1/19:  5 mm acute infarct within the midline dorsal pons. Pt failed the Yale due to coughing. PMH: CKD, T2DM, HTN, gout, hx of TIA, VF.    Assessment / Plan / Recommendation  Clinical Impression  Pt was seen for bedside swallow evaluation and she denied a history of dysphagia. Oral mechanism exam was significant for mildly reduced lingual strength. She presented with natural dentition with some missing molars. She tolerated purees and nectar thick liquids without overt s/sx of aspiration. Mastication and oral clearance were adequate, but bolus sizes were small since pt  reported being fearful of swallowing since "getting choked" during the Yale screening last night. Pt exhibited throat clearing and slight coughing with thin liquids and regular texture solids. Pt expressed that swallowing felt "unusual" and that she felt that liquids "just aren't going down right". A modified barium swallow study is recommended to further assess swallow function and it is scheduled for today at 1330. Pt may have floor-stock purees and nectar thick liquids until the study is completed, and meds may be given whole/crushed with puree. SLP Visit Diagnosis: Dysphagia, unspecified (R13.10)    Aspiration Risk  Mild aspiration risk    Diet Recommendation  (Pt may have floor-stock purees, nectar thick liquids)   Liquid Administration via: Cup Medication Administration:  (meds given whole/crushed in puree) Supervision: Staff to assist with self feeding Compensations: Slow rate;Small sips/bites Postural Changes: Seated upright at 90 degrees    Other  Recommendations Oral Care Recommendations: Oral care BID    Recommendations for follow up therapy are one component of a multi-disciplinary discharge planning process, led by the attending physician.  Recommendations may be updated based on patient status, additional functional criteria and insurance authorization.  Follow up Recommendations  (TBD)      Assistance Recommended at Discharge    Functional Status Assessment Patient has had a recent decline in their functional status and demonstrates the ability to make significant improvements in function in a reasonable and predictable amount of time.  Frequency and Duration min 2x/week  2 weeks  Prognosis Prognosis for Safe Diet Advancement: Good      Swallow Study   General Date of Onset: 06/07/21 HPI: [name] who presented to the ED with vision changes and several week history of weakness. MRI brain 1/19:  5 mm acute infarct within the midline dorsal pons. Pt  failed the Yale due to coughing. PMH: CKD, T2DM, HTN, gout, hx of TIA, VF. Type of Study: Bedside Swallow Evaluation Previous Swallow Assessment: none Diet Prior to this Study: NPO Temperature Spikes Noted: No Respiratory Status: Room air History of Recent Intubation: No Behavior/Cognition: Alert;Cooperative;Pleasant mood Oral Cavity Assessment: Within Functional Limits Oral Cavity - Dentition: Missing dentition;Adequate natural dentition Vision: Functional for self-feeding Self-Feeding Abilities: Needs assist Patient Positioning: Upright in bed;Postural control adequate for testing Baseline Vocal Quality: Normal Volitional Cough: Strong Volitional Swallow: Able to elicit    Oral/Motor/Sensory Function Overall Oral Motor/Sensory Function: Mild impairment Facial ROM: Within Functional Limits Facial Symmetry: Within Functional Limits Facial Strength: Within Functional Limits Lingual ROM: Within Functional Limits Lingual Strength: Reduced;Suspected CN XII (hypoglossal) dysfunction   Ice Chips Ice chips: Not tested   Thin Liquid Thin Liquid: Impaired Presentation: Cup Pharyngeal  Phase Impairments: Throat Clearing - Immediate;Cough - Delayed    Nectar Thick Nectar Thick Liquid: Within functional limits Presentation: Cup   Honey Thick Honey Thick Liquid: Not tested   Puree Puree: Within functional limits Presentation: Spoon   Solid     Solid: Impaired Presentation: Self Fed Pharyngeal Phase Impairments: Throat Clearing - Immediate     Mia [name] I. Mia [name], [place], [place] Office number [phone] Pager [phone]  Mia [name] 06/08/2021,9:18 AM

## 2021-06-08 NOTE — TOC Initial Note (Signed)
Transition of Care [facility]) - Initial/Assessment Note    Patient Details  Name: Mia [name] MRN: [id number] Date of Birth: 06/12/1925  Transition of Care [facility]) CM/SW Contact:    Geralynn Ochs, LCSW Phone Number: 06/08/2021, 2:07 PM  Clinical Narrative:       CSW met with patient to discuss recommendation for SNF. Patient in agreement. Patient has never been to a rehab facility before, but would like to be close to home if possible. CSW asked about updating any of her family and patient refused. CSW to fax out referral and will follow up with bed offers.            Expected Discharge Plan: Skilled Nursing Facility Barriers to Discharge: Continued Medical Work up, Ship broker   Patient Goals and CMS Choice Patient states their goals for this hospitalization and ongoing recovery are:: to get rehab and get back home CMS Medicare.gov Compare Post Acute Care list provided to:: Patient Choice offered to / list presented to : Patient  Expected Discharge Plan and Services Expected Discharge Plan: [place] Choice: [place] Living arrangements for the past 2 months: [place]                                      Prior Living Arrangements/Services Living arrangements for the past 2 months: [place] Lives with:: Facility Resident Patient language and need for interpreter reviewed:: No Do you feel safe going back to the place where you live?: Yes      Need for Family Participation in Patient Care: No (Comment) Care giver support system in place?: No (comment)   Criminal Activity/Legal Involvement Pertinent to Current Situation/Hospitalization: No - Comment as needed  Activities of Daily Living Home Assistive Devices/Equipment: Gilford Rile (specify type) ADL Screening (condition at time of admission) Patient's cognitive ability adequate to safely complete daily activities?: Yes Is  the patient deaf or have difficulty hearing?: No Does the patient have difficulty seeing, even when wearing glasses/contacts?: Yes Does the patient have difficulty concentrating, remembering, or making decisions?: Yes Patient able to express need for assistance with ADLs?: No Does the patient have difficulty dressing or bathing?: No Independently performs ADLs?: Yes (appropriate for developmental age) Does the patient have difficulty walking or climbing stairs?: No Weakness of Legs: None Weakness of Arms/Hands: None  Permission Sought/Granted Permission sought to share information with : Chartered certified accountant granted to share information with : Yes, Verbal Permission Granted     Permission granted to share info w AGENCY: SNF        Emotional Assessment Appearance:: Appears stated age Attitude/Demeanor/Rapport: Engaged Affect (typically observed): Appropriate Orientation: : Oriented to Self, Oriented to Place, Oriented to  Time, Oriented to Situation Alcohol / Substance Use: Not Applicable Psych Involvement: No (comment)  Admission diagnosis:  Blister [T14.8XXA] Leg edema [R60.0] CVA (cerebral vascular accident) [facility]) [I63.9] Cerebrovascular accident (CVA), unspecified mechanism ([place]) [I63.9] Patient Active Problem List   Diagnosis Date Noted   Hypokalemia 06/08/2021   CVA (cerebral vascular accident) ([place]) 06/07/2021   Type 2 diabetes mellitus without complication ([place]) 63/78/5885   AF (paroxysmal atrial fibrillation) ([place]) 06/07/2021   Swelling of lower leg 06/07/2021   Aphasia 06/07/2021   Intermediate stage nonexudative age-related macular degeneration of both eyes 04/24/2020   Posterior vitreous detachment of right eye 04/24/2020   Degenerative retinal  drusen of right eye 04/24/2020   Cystoid macular edema of right eye 04/24/2020   CKD (chronic kidney disease) stage 4, GFR 15-29 ml/min (HCC) 09/11/2017   Imbalance 09/16/2016   At risk for falls  09/16/2016   Muscle weakness 09/16/2016   Raynaud's disease without gangrene 06/10/2016   Lower leg edema 06/03/2016   Gout of multiple sites 06/03/2016   Hereditary and idiopathic peripheral neuropathy 06/03/2016   History of total knee replacement 10/06/2015   RBBB 09/06/2015   Preoperative cardiovascular examination 09/06/2015   Mild cognitive impairment with memory loss 09/04/2015   Loss of weight 09/04/2015   Primary osteoarthritis of right knee 07/15/2014   Xerosis cutis 07/15/2014   Superficial burn of groin 11/24/2013   B12 deficiency 09/23/2013   Essential hypertension, benign 09/23/2013   PVC's (premature ventricular contractions) 09/23/2013   Vitamin D deficiency 09/23/2013   Hyperglycemia 09/23/2013   DOE (dyspnea on exertion) 08/06/2013   PCP:  Sandrea Hughs, NP Pharmacy:   [phone] [place], [name] - [address] [name] [address] Phone: [phone] Fax: [id number] - [place], [place] - [address] [place] [address] Phone: [phone] Fax: [place], [name] - [place] [address] [name] [address] Phone: [phone] Fax: [phone]     Social Determinants of Health (SDOH) Interventions    Readmission Risk Interventions No flowsheet data found.

## 2021-06-08 NOTE — NC FL2 (Signed)
[place] LEVEL OF CARE SCREENING TOOL     IDENTIFICATION  Patient Name: Mia [name] Birthdate: 1926-04-17 Sex: female Admission Date (Current Location): 06/07/2021  [facility] [place] Number:  Herbalist and Address:  [facility], [place] [address], [place], [place] [address]      Provider Number: [id number]  Attending Physician Name and Address:  Bonnielee Haff, MD  Relative Name and Phone Number:       Current Level of Care: Hospital Recommended Level of Care: [place] Prior Approval Number:    Date Approved/Denied:   PASRR Number: [id number]  Discharge Plan: SNF    Current Diagnoses: Patient Active Problem List   Diagnosis Date Noted   Hypokalemia 06/08/2021   CVA (cerebral vascular accident) ([place]) 06/07/2021   Type 2 diabetes mellitus without complication ([place]) 67/04/4579   AF (paroxysmal atrial fibrillation) ([place]) 06/07/2021   Swelling of lower leg 06/07/2021   Aphasia 06/07/2021   Intermediate stage nonexudative age-related macular degeneration of both eyes 04/24/2020   Posterior vitreous detachment of right eye 04/24/2020   Degenerative retinal drusen of right eye 04/24/2020   Cystoid macular edema of right eye 04/24/2020   CKD (chronic kidney disease) stage 4, GFR 15-29 ml/min ([place]) 09/11/2017   Imbalance 09/16/2016   At risk for falls 09/16/2016   Muscle weakness 09/16/2016   Raynaud's disease without gangrene 06/10/2016   Lower leg edema 06/03/2016   Gout of multiple sites 06/03/2016   Hereditary and idiopathic peripheral neuropathy 06/03/2016   History of total knee replacement 10/06/2015   RBBB 09/06/2015   Preoperative cardiovascular examination 09/06/2015   Mild cognitive impairment with memory loss 09/04/2015   Loss of weight 09/04/2015   Primary osteoarthritis of right knee 07/15/2014   Xerosis cutis 07/15/2014   Superficial burn of groin 11/24/2013   B12 deficiency 09/23/2013    Essential hypertension, benign 09/23/2013   PVC's (premature ventricular contractions) 09/23/2013   Vitamin D deficiency 09/23/2013   Hyperglycemia 09/23/2013   DOE (dyspnea on exertion) 08/06/2013    Orientation RESPIRATION BLADDER Height & Weight     Self, Time, Situation, Place  Normal Incontinent Weight: 123 lb 3.8 oz (55.9 kg) Height:  5\' 7"  (170.2 cm)  BEHAVIORAL SYMPTOMS/MOOD NEUROLOGICAL BOWEL NUTRITION STATUS      Continent Diet (see DC summary)  AMBULATORY STATUS COMMUNICATION OF NEEDS Skin   Limited Assist Verbally Skin abrasions (right pretibial, foam dressing change daily)                       Personal Care Assistance Level of Assistance  Bathing, Feeding, Dressing Bathing Assistance: Limited assistance Feeding assistance: Limited assistance Dressing Assistance: Limited assistance     Functional Limitations Info  Sight Sight Info: Impaired        SPECIAL CARE FACTORS FREQUENCY  PT (By licensed PT), OT (By licensed OT), Speech therapy     PT Frequency: 5x/wk OT Frequency: 5x/wk     Speech Therapy Frequency: 5x/wk      Contractures Contractures Info: Not present    Additional Factors Info  Code Status, Allergies, Insulin Sliding Scale Code Status Info: DNR Allergies Info: NKA   Insulin Sliding Scale Info: see DC summary       Current Medications (06/08/2021):  This is the current hospital active medication list Current Facility-Administered Medications  Medication Dose Route Frequency Provider Last Rate Last Admin   acetaminophen (TYLENOL) tablet 650 mg  650 mg Oral  Q4H PRN Orma Flaming, MD       Or   acetaminophen (TYLENOL) 160 MG/5ML solution 650 mg  650 mg Per Tube Q4H PRN Orma Flaming, MD       Or   acetaminophen (TYLENOL) suppository 650 mg  650 mg Rectal Q4H PRN Orma Flaming, MD       aspirin EC tablet 81 mg  81 mg Oral Daily Greta Doom, MD       clopidogrel (PLAVIX) tablet 75 mg  75 mg Oral q AM Orma Flaming, MD    75 mg at 06/08/21 1102   enoxaparin (LOVENOX) injection 30 mg  30 mg Subcutaneous Q24H Orma Flaming, MD       insulin aspart (novoLOG) injection 0-9 Units  0-9 Units Subcutaneous TID WC Orma Flaming, MD       ketorolac (ACULAR) 0.5 % ophthalmic solution 1 drop  1 drop Right Eye TID Orma Flaming, MD   1 drop at 06/08/21 1543   LORazepam (ATIVAN) injection 0.5 mg  0.5 mg Intravenous Once Henderly, Britni A, PA-C       metoprolol tartrate (LOPRESSOR) injection 5 mg  5 mg Intravenous Q6H PRN Orma Flaming, MD       metoprolol tartrate (LOPRESSOR) tablet 25 mg  25 mg Oral BID Bonnielee Haff, MD       potassium chloride (KLOR-CON) packet 40 mEq  40 mEq Oral Once Bonnielee Haff, MD       senna-docusate (Senokot-S) tablet 1 tablet  1 tablet Oral QHS PRN Orma Flaming, MD         Discharge Medications: Please see discharge summary for a list of discharge medications.  Relevant Imaging Results:  Relevant Lab Results:   Additional Information SS#: [id number]  Geralynn Ochs, LCSW

## 2021-06-08 NOTE — Evaluation (Signed)
Speech Language Pathology Evaluation Patient Details Name: Mia [name] MRN: [id number] DOB: 29-Oct-1925 Today's Date: 06/08/2021 Time: 3354-5625 SLP Time Calculation (min) (ACUTE ONLY): 16 min  Problem List:  Patient Active Problem List   Diagnosis Date Noted   Hypokalemia 06/08/2021   CVA (cerebral vascular accident) ([place]) 06/07/2021   Type 2 diabetes mellitus without complication ([place]) 63/89/3734   AF (paroxysmal atrial fibrillation) ([place]) 06/07/2021   Swelling of lower leg 06/07/2021   Aphasia 06/07/2021   Intermediate stage nonexudative age-related macular degeneration of both eyes 04/24/2020   Posterior vitreous detachment of right eye 04/24/2020   Degenerative retinal drusen of right eye 04/24/2020   Cystoid macular edema of right eye 04/24/2020   CKD (chronic kidney disease) stage 4, GFR 15-29 ml/min ([place]) 09/11/2017   Imbalance 09/16/2016   At risk for falls 09/16/2016   Muscle weakness 09/16/2016   Raynaud's disease without gangrene 06/10/2016   Lower leg edema 06/03/2016   Gout of multiple sites 06/03/2016   Hereditary and idiopathic peripheral neuropathy 06/03/2016   History of total knee replacement 10/06/2015   RBBB 09/06/2015   Preoperative cardiovascular examination 09/06/2015   Mild cognitive impairment with memory loss 09/04/2015   Loss of weight 09/04/2015   Primary osteoarthritis of right knee 07/15/2014   Xerosis cutis 07/15/2014   Superficial burn of groin 11/24/2013   B12 deficiency 09/23/2013   Essential hypertension, benign 09/23/2013   PVC's (premature ventricular contractions) 09/23/2013   Vitamin D deficiency 09/23/2013   Hyperglycemia 09/23/2013   DOE (dyspnea on exertion) 08/06/2013   Past Medical History:  Past Medical History:  Diagnosis Date   Arthritis    oa   Chronic kidney disease    chronic kidney diseaase follow by primary md   Diabetes mellitus without complication ([place])    Diet controlled   Former smoker    H/O ventricular  fibrillation 10 yrs ago dx   Hypertension    Prediabetes    Seasonal allergies    TIA (transient ischemic attack) none recent   Past Surgical History:  Past Surgical History:  Procedure Laterality Date   NM MYOCAR PERF WALL MOTION  01/2008   lexiscan myoview - normal pattern of perfusion in all regions, EF 92%, no significant ischemia demonstated   TONSILLECTOMY  age 44   and adenoids   TOTAL KNEE ARTHROPLASTY Right 10/06/2015   Procedure: RIGHT TOTAL KNEE ARTHROPLASTY;  Surgeon: [name], MD;  Location: [facility];  Service: Orthopedics;  Laterality: Right;   TRANSTHORACIC ECHOCARDIOGRAM  12/2005   mild DUST, EF=>55%; mild mitral annular calcif; mild-mod aortic root calcif   HPI:  [name] who presented to the ED with vision changes and several week history of weakness. MRI brain 1/19:  5 mm acute infarct within the midline dorsal pons. Pt failed the Yale due to coughing. PMH: CKD, T2DM, HTN, gout, hx of TIA, VF.   Assessment / Plan / Recommendation Clinical Impression  Pt participated in speech/language/cognition evaluation. 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 She denied any baseline deficits in speech, language, or cognition. She stated that she lives in an [place] and was independent with medication and financial management. Motor speech skills were WFL. Word retrieval difficulty was occasionally noted and additional time was necessary for linguistic processing of large amounts of information. The [facility] Mental Status Examination was completed to evaluate the pt's cognitive-linguistic skills.  Pt's score was adjusted since her vision was too impaired for the completion of some tasks. She achieved an adjusted score of 9/24 which is below the normal limits. She exhibited deficits in the areas of temporal orientation, attention, memory, complex problem solving, and executive  function. Skilled SLP services are clinically indicated at this time to improve cognitive-linguistic function.    SLP Assessment  SLP Recommendation/Assessment: Patient needs continued Speech [place] Pathology Services SLP Visit Diagnosis: Cognitive communication deficit (R41.841)    Recommendations for follow up therapy are one component of a multi-disciplinary discharge planning process, led by the attending physician.  Recommendations may be updated based on patient status, additional functional criteria and insurance authorization.    Follow Up Recommendations   (Continued SLP services at level of care recommened by PT/OT)    Assistance Recommended at Discharge  Intermittent Supervision/Assistance  Functional Status Assessment Patient has had a recent decline in their functional status and demonstrates the ability to make significant improvements in function in a reasonable and predictable amount of time.  Frequency and Duration min 2x/week  2 weeks      SLP Evaluation Cognition  Overall Cognitive Status: Impaired/Different from baseline Arousal/Alertness: Awake/alert Orientation Level: Oriented to person;Oriented to place;Oriented to situation;Disoriented to time Year:  (2003) Month:  ("Either the 5th of March or the 1st of April") Day of Week: Incorrect Attention: Focused;Sustained Focused Attention: Appears intact Sustained Attention: Impaired Sustained Attention Impairment: Verbal complex Memory: Impaired Memory Impairment: Retrieval deficit;Storage deficit (Immediate: 5/5 with repetition x3; delayed: 1/5; with cues: 3/4) Awareness: Appears intact Problem Solving: Impaired Problem Solving Impairment: Verbal complex Medical illustrator- 1/3) Executive Function: Sequencing;Organizing Sequencing: Impaired Organizing: Impaired Organizing Impairment: Verbal complex (backward digit span: 1/2)       Comprehension  Auditory Comprehension Overall Auditory Comprehension:  Appears within functional limits for tasks assessed Yes/No Questions: Within Functional Limits Commands: Within Functional Limits Conversation: Complex    Expression Expression Primary Mode of Expression: Verbal Verbal Expression Initiation: No impairment Level of Generative/Spontaneous Verbalization: Conversation Naming: No impairment   Oral / Motor  Oral Motor/Sensory Function Overall Oral Motor/Sensory Function: Mild impairment Facial ROM: Within Functional Limits Facial Symmetry: Within Functional Limits Facial Strength: Within Functional Limits Lingual ROM: Within Functional Limits Lingual Strength: Reduced;Suspected CN XII (hypoglossal) dysfunction Motor Speech Overall Motor Speech: Appears within functional limits for tasks assessed Respiration: Within functional limits Phonation: Normal Resonance: Within functional limits Articulation: Within functional limitis Intelligibility: Intelligible Motor Planning: Witnin functional limits           Cornel Werber I. Hardin Negus, [place], [place] Office number [phone] Pager [place] 06/08/2021, 1:11 PM

## 2021-06-08 NOTE — Progress Notes (Signed)
Carotid duplex bilateral and TCD study completed.   Please see CV Proc for preliminary results.   Destanie Tibbetts, RDMS, RVT  

## 2021-06-08 NOTE — Progress Notes (Signed)
Echocardiogram 2D Echocardiogram has been performed.  Mia [name] 06/08/2021, 10:49 AM

## 2021-06-08 NOTE — Progress Notes (Signed)
OT Cancellation Note  Patient Details Name: Mia [name] MRN: [id number] DOB: 1926-02-18   Cancelled Treatment:    Reason Eval/Treat Not Completed: Patient at procedure or test/ unavailable. Pt out of room for procedure. Plan to reattempt.  Tyrone Schimke, OT Acute Rehabilitation Services Office: [phone]  06/08/2021, 10:41 AM

## 2021-06-08 NOTE — Assessment & Plan Note (Addendum)
Improved.  Monitor at [place].

## 2021-06-08 NOTE — Progress Notes (Signed)
SLP Cancellation Note  Patient Details Name: Mia [name] MRN: [id number] DOB: [birth year]   Cancelled treatment:       Reason Eval/Treat Not Completed: Patient at procedure or test/unavailable (Transport arrived to take pt to vascular. SLP will follow up.)  Shaquayla Klimas I. Hardin Negus, [place], [place] Office number [phone] Pager [place] 06/08/2021, 9:21 AM

## 2021-06-08 NOTE — Progress Notes (Signed)
[facility] PROGRESS NOTE   Mia [name] WIO:[id number] DOB: 1925-10-14 DOA: 06/07/2021  0 DOS: the patient was seen and examined on 06/08/2021  PCP: Ngetich, Nelda Bucks, NP  Brief History and Hospital Course:  88 y.o. female with medical history significant of CKD, T2DM, HTN, gout, hx of TIA, hx of VF who presented to ED with several week history of weakness and vision changes that started on the morning of admission.  Also had a headache.  MRI brain showed an acute infarct.  Patient was hospitalized for further management.  Patient also has a history of aphasia which is at baseline.  Consultants: Neurology  Procedures: Transthoracic echocardiogram.  Carotid Doppler.    Subjective: Patient mentions that she is having visual impairment.  Seems to be anxious.  Denies any chest pain shortness of breath.    Assessment/Plan:  * CVA (cerebral vascular accident) ([place])- (present on admission) MRI showed acute infarct.  Neurology is following.  Appears to have newly diagnosed paroxysmal atrial fibrillation. Carotid Dopplers pending. Anticoagulation was discussed with family and they are discussing this amongst themselves. Currently on aspirin and Plavix. LDL 67.  HbA1c is pending. PT OT and speech therapy to see. Echocardiogram is pending.   AF (paroxysmal atrial fibrillation) ([place])- (present on admission) Appears to be new diagnosis for her. Family was unaware of this.  Heart rate seems to be reasonably well controlled.  Reasonable to initiate low-dose metoprolol. CHA2DS2-VASc score of 6.  Echocardiogram is pending.  Family to decide on anticoagulation. Check TSH.  Essential hypertension, benign- (present on admission) Allowing permissive hypertension. Prior to admission she was on hydrochlorothiazide metoprolol.  CKD (chronic kidney disease) stage 4, GFR 15-29 ml/min (HCC)- (present on admission) Seems to be close to baseline.  Monitor urine output.  Avoid nephrotoxic  agents.  Type 2 diabetes mellitus without complication (HCC) Diet controlled. Last a1c in 10/21 was 6.1.  Current HbA1c is pending.  Continue SSI.  Monitor CBGs.  Swelling of lower leg- (present on admission) Has a known history of pedal edema.  Was given 1 dose of Lasix.  Follow-up on echocardiogram.  Aphasia- (present on admission) Has been going on x 2-3 years, confirmed with family.   Mild cognitive impairment with memory loss- (present on admission) Present at baseline.  Delirium precautions.  Hypokalemia Potassium will be repleted.  Recheck labs tomorrow.      DVT Prophylaxis: Lovenox Code Status: DNR Family Communication: No family at bedside Disposition Plan: To be determined  Status is: Observation  The patient will require care spanning > 2 midnights and should be moved to inpatient because: Acute stroke with ongoing work-up        Medications: Scheduled:  aspirin EC  81 mg Oral Daily   clopidogrel  75 mg Oral q AM   enoxaparin (LOVENOX) injection  30 mg Subcutaneous Q24H   insulin aspart  0-9 Units Subcutaneous TID WC   ketorolac  1 drop Right Eye TID   LORazepam  0.5 mg Intravenous Once   metoprolol tartrate  25 mg Oral BID   potassium chloride  40 mEq Oral Once   Continuous: EQA:[id number] **OR** acetaminophen (TYLENOL) oral liquid 160 mg/5 mL **OR** acetaminophen, metoprolol tartrate, senna-docusate  Antibiotics: Anti-infectives (From admission, onward)    None       Objective:  Vital Signs  Vitals:   06/08/21 0005 06/08/21 0424 06/08/21 0500 06/08/21 0804  BP: (!) 164/75 (!) 164/74  (!) 171/102  Pulse: 85 (!) 114  89  Resp:  16 16  17   Temp: 98.1 F (36.7 C) 99.4 F (37.4 C)  97.7 F (36.5 C)  TempSrc: Oral Oral  Oral  SpO2: 94% 96%  98%  Weight:   55.9 kg   Height:        Intake/Output Summary (Last 24 hours) at 06/08/2021 0953 Last data filed at 06/08/2021 5397 Gross per 24 hour  Intake --  Output 2250 ml  Net -2250 ml    Filed Weights   06/07/21 1226 06/07/21 2113 06/08/21 0500  Weight: 61.2 kg 60.2 kg 55.9 kg    General appearance: Awake alert.  In no distress.  Mildly distracted Resp: Clear to auscultation bilaterally.  Normal effort Cardio: S1-S2 is normal regular.  No S3-S4.  No rubs murmurs or bruit GI: Abdomen is soft.  Nontender nondistended.  Bowel sounds are present normal.  No masses organomegaly Extremities: No edema.  Limited range of motion of the lower extremities due to physical deconditioning. Neurologic: She is alert.  Oriented to city year.  No facial asymmetry noted.  No obvious focal motor deficits noted.  Lab Results:  Data Reviewed: I have personally reviewed labs and imaging study reports  CBC: Recent Labs  Lab 06/07/21 1245 06/08/21 0256  WBC 5.4 6.2  NEUTROABS 4.6  --   HGB 13.0 13.9  HCT 40.0 41.2  MCV 93.2 90.9  PLT 217 673    Basic Metabolic Panel: Recent Labs  Lab 06/07/21 1245 06/08/21 0256  NA 145 145  K 3.9 3.2*  CL 109 104  CO2 25 25  GLUCOSE 131* 97  BUN 26* 23  CREATININE 1.69* 1.58*  CALCIUM 9.7 10.2    GFR: Estimated Creatinine Clearance: 19.6 mL/min (A) (by C-G formula based on SCr of 1.58 mg/dL (H)).   Coagulation Profile: Recent Labs  Lab 06/07/21 1245  INR 1.0     CBG: Recent Labs  Lab 06/07/21 2243 06/08/21 0605  GLUCAP 92 108*    Lipid Profile: Recent Labs    06/08/21 0256  CHOL 150  HDL 76  LDLCALC 67  TRIG 36  CHOLHDL 2.0     Recent Results (from the past 240 hour(s))  Resp Panel by RT-PCR (Flu A&B, Covid) Nasopharyngeal Swab     Status: None   Collection Time: 06/07/21 12:45 PM   Specimen: Nasopharyngeal Swab; Nasopharyngeal(NP) swabs in vial transport medium  Result Value Ref Range Status   SARS Coronavirus 2 by RT PCR NEGATIVE NEGATIVE Final    Comment: (NOTE) SARS-CoV-2 target nucleic acids are NOT DETECTED.  The SARS-CoV-2 RNA is generally detectable in upper respiratory specimens during the  acute phase of infection. The lowest concentration of SARS-CoV-2 viral copies this assay can detect is 138 copies/mL. A negative result does not preclude SARS-Cov-2 infection and should not be used as the sole basis for treatment or other patient management decisions. A negative result may occur with  improper specimen collection/handling, submission of specimen other than nasopharyngeal swab, presence of viral mutation(s) within the areas targeted by this assay, and inadequate number of viral copies(<138 copies/mL). A negative result must be combined with clinical observations, patient history, and epidemiological information. The expected result is Negative.  Fact Sheet for Patients:  EntrepreneurPulse.com.au  Fact Sheet for Healthcare Providers:  IncredibleEmployment.be  This test is no t yet approved or cleared by the [place] FDA and  has been authorized for detection and/or diagnosis of SARS-CoV-2 by FDA under an Emergency Use Authorization (EUA). This EUA will remain  in  effect (meaning this test can be used) for the duration of the COVID-19 declaration under Section 564(b)(1) of the Act, 21 U.S.C.section 360bbb-3(b)(1), unless the authorization is terminated  or revoked sooner.       Influenza A by PCR NEGATIVE NEGATIVE Final   Influenza B by PCR NEGATIVE NEGATIVE Final    Comment: (NOTE) The Xpert Xpress SARS-CoV-2/FLU/RSV plus assay is intended as an aid in the diagnosis of influenza from Nasopharyngeal swab specimens and should not be used as a sole basis for treatment. Nasal washings and aspirates are unacceptable for Xpert Xpress SARS-CoV-2/FLU/RSV testing.  Fact Sheet for Patients: EntrepreneurPulse.com.au  Fact Sheet for Healthcare Providers: IncredibleEmployment.be  This test is not yet approved or cleared by the [place] FDA and has been authorized for detection and/or  diagnosis of SARS-CoV-2 by FDA under an Emergency Use Authorization (EUA). This EUA will remain in effect (meaning this test can be used) for the duration of the COVID-19 declaration under Section 564(b)(1) of the Act, 21 U.S.C. section 360bbb-3(b)(1), unless the authorization is terminated or revoked.  Performed at [facility], [place] [address] [place], [place] [address]       Radiology Studies: DG Chest 2 View  Result Date: 06/07/2021 CLINICAL DATA:  Shortness of breath with lower extremity edema. EXAM: CHEST - 2 VIEW COMPARISON:  05/07/2007. FINDINGS: Lungs are hyperexpanded. The lungs are clear without focal pneumonia, edema, pneumothorax or pleural effusion. The cardio pericardial silhouette is enlarged. Bones are diffusely demineralized. Telemetry leads overlie the chest. IMPRESSION: Hyperexpansion without acute cardiopulmonary findings. Electronically Signed   By: Misty Stanley M.D.   On: 06/07/2021 13:20   CT HEAD WO CONTRAST (5MM)  Result Date: 06/07/2021 CLINICAL DATA:  Left eye vision problems EXAM: CT HEAD WITHOUT CONTRAST TECHNIQUE: Contiguous axial images were obtained from the base of the skull through the vertex without intravenous contrast. RADIATION DOSE REDUCTION: This exam was performed according to the departmental dose-optimization program which includes automated exposure control, adjustment of the mA and/or kV according to patient size and/or use of iterative reconstruction technique. COMPARISON:  Hypo CT dated January 05, 2019 FINDINGS: Brain: Chronic white matter ischemic change. No evidence of acute infarction, hemorrhage, hydrocephalus, extra-axial collection or mass lesion/mass effect. Vascular: No hyperdense vessel or unexpected calcification. Skull: Normal. Negative for fracture or focal lesion. Sinuses/Orbits: No acute finding. Other: None. IMPRESSION: No acute intracranial abnormality. Electronically Signed   By: Yetta Glassman M.D.   On: 06/07/2021 13:37    MR BRAIN WO CONTRAST  Result Date: 06/07/2021 CLINICAL DATA:  Provided history: Neuro deficit, acute, stroke suspected. Additional history provided: Progressively worsening blurry vision, generalized weakness, left-sided gaze noted. EXAM: MRI HEAD WITHOUT CONTRAST TECHNIQUE: Multiplanar, multiecho pulse sequences of the brain and surrounding structures were obtained without intravenous contrast. COMPARISON:  Head CT 06/07/2021. Report from brain MRI 07/08/2000 (images unavailable). FINDINGS: Brain: Mild-to-moderate generalized cerebral atrophy. Comparatively mild cerebellar atrophy. 5 mm focus of restricted diffusion within the midline dorsal pons compatible with acute infarct (series 3, image 13) (series 4, image 16). Mild-to-moderate multifocal T2 FLAIR hyperintense signal abnormality within the cerebral white matter, nonspecific but compatible chronic small vessel ischemic disease. Prominent perivascular space versus small chronic lacunar infarct within the left caudate head (series 10, image 17). Punctate chronic microhemorrhage within the left parietal lobe (series 8, image 15). Additional foci of T2* signal loss within the bilateral basal ganglia, which may reflect mineralization or additional punctate chronic microhemorrhages. No evidence of an intracranial mass. No  extra-axial fluid collection. No midline shift. Vascular: Maintained flow voids within the proximal large arterial vessels. Skull and upper cervical spine: No focal suspicious marrow lesion. Incompletely assessed cervical spondylosis Sinuses/Orbits: Visualized orbits show no acute finding. Bilateral ocular lens replacements. Mild mucosal thickening within the bilateral ethmoid, sphenoid and maxillary sinuses. Other: Trace fluid within the bilateral mastoid air cells. IMPRESSION: 5 mm acute infarct within the midline dorsal pons. Mild-to-moderate chronic small vessel ischemic changes within the cerebral white matter. Mild-to-moderate  generalized cerebral atrophy. Comparatively mild cerebellar atrophy. Mild paranasal sinus mucosal thickening. Trace fluid within the bilateral mastoid air cells. Electronically Signed   By: Kellie Simmering D.O.   On: 06/07/2021 15:32       LOS: 0 days   [facility] Pager on [email or web address]  06/08/2021, 9:53 AM

## 2021-06-09 DIAGNOSIS — E876 Hypokalemia: Secondary | ICD-10-CM

## 2021-06-09 DIAGNOSIS — I639 Cerebral infarction, unspecified: Secondary | ICD-10-CM

## 2021-06-09 DIAGNOSIS — I6302 Cerebral infarction due to thrombosis of basilar artery: Secondary | ICD-10-CM

## 2021-06-09 DIAGNOSIS — E119 Type 2 diabetes mellitus without complications: Secondary | ICD-10-CM

## 2021-06-09 LAB — BASIC METABOLIC PANEL
Anion gap: 9 (ref 5–15)
BUN: 22 mg/dL (ref 8–23)
CO2: 27 mmol/L (ref 22–32)
Calcium: 9.7 mg/dL (ref 8.9–10.3)
Chloride: 107 mmol/L (ref 98–111)
Creatinine, Ser: 1.6 mg/dL — ABNORMAL HIGH (ref 0.44–1.00)
GFR, Estimated: 30 mL/min — ABNORMAL LOW (ref >60)
Glucose, Bld: 97 mg/dL (ref 70–99)
Potassium: 3.3 mmol/L — ABNORMAL LOW (ref 3.5–5.1)
Sodium: 143 mmol/L (ref 135–145)

## 2021-06-09 LAB — CBC
HCT: 42.3 % (ref 36.0–46.0)
Hemoglobin: 14 g/dL (ref 12.0–15.0)
MCH: 30 pg (ref 26.0–34.0)
MCHC: 33.1 g/dL (ref 30.0–36.0)
MCV: 90.8 fL (ref 80.0–100.0)
Platelets: 195 10*3/uL (ref 150–400)
RBC: 4.66 MIL/uL (ref 3.87–5.11)
RDW: 15.5 % (ref 11.5–15.5)
WBC: 6.3 10*3/uL (ref 4.0–10.5)
nRBC: 0 % (ref 0.0–0.2)

## 2021-06-09 LAB — GLUCOSE, CAPILLARY
Glucose-Capillary: 95 mg/dL (ref 70–99)
Glucose-Capillary: 96 mg/dL (ref 70–99)
Glucose-Capillary: 123 mg/dL — ABNORMAL HIGH (ref 70–99)
Glucose-Capillary: 101 mg/dL — ABNORMAL HIGH (ref 70–99)

## 2021-06-09 LAB — MAGNESIUM: Magnesium: 1.8 mg/dL (ref 1.7–2.4)

## 2021-06-09 MED ORDER — MAGNESIUM SULFATE 2 GM/50ML IV SOLN
2.0000 g | Freq: Once | INTRAVENOUS | Status: AC
  Administered 2021-06-09: 2 g via INTRAVENOUS
  Filled 2021-06-09: qty 50

## 2021-06-09 MED ORDER — POTASSIUM CHLORIDE 20 MEQ PO PACK
40.0000 meq | PACK | Freq: Once | ORAL | Status: AC
  Administered 2021-06-09: 40 meq via ORAL
  Filled 2021-06-09: qty 2

## 2021-06-09 NOTE — Progress Notes (Signed)
[facility] PROGRESS NOTE   Mia [name] TUU:[id number] DOB: 11/08/1925 DOA: 06/07/2021  1 DOS: the patient was seen and examined on 06/09/2021  PCP: Ngetich, Nelda Bucks, NP  Brief History and Hospital Course:  88 y.o. female with medical history significant of CKD, T2DM, HTN, gout, hx of TIA, hx of VF who presented to ED with several week history of weakness and vision changes that started on the morning of admission.  Also had a headache.  MRI brain showed an acute infarct.  Patient was hospitalized for further management.  Patient also has a history of aphasia which is at baseline.  Patient was hospitalized and seen by neurology.  She was also found to have atrial fibrillation which is a new diagnosis for her.  Consultants: Neurology  Procedures: Transthoracic echocardiogram.  Carotid Doppler.    Subjective: Patient mentions that she is feeling about the same.  No significant changes in vision but once her glasses were taped on the left side she is able to tolerate it better.  Anticoagulation was discussed in detail with the patient.  She seemed to understand everything I was saying.  She would like some time to think about it.     Assessment/Plan:  * CVA (cerebral vascular accident) ([place])- (present on admission) Patient with significant visual disturbances. MRI showed acute infarct.  Neurology is following.  Appears to have newly diagnosed paroxysmal atrial fibrillation. Carotid Doppler did not reveal any significant stenosis. Patient was started on aspirin and Plavix by neurology to be taken for 3 weeks followed by aspirin alone. Due to new onset atrial fibrillation anticoagulation was also discussed with patient and she is still thinking about it.  Risks and benefits were explained to her. LDL 67.  HbA1c is 6.3. Seen by PT OT and SLP.  Skilled nursing facility is recommended for short-term rehab. Echocardiogram reveals normal systolic function.  Left atrial dilatation  noted. On dysphagia 3 diet with thin liquids.  AF (paroxysmal atrial fibrillation) ([place])- (present on admission) Appears to be new diagnosis for her. Family was unaware of this.  Started on low-dose beta-blocker. CHA2DS2-VASc score of 6.  Patient still unsure about anticoagulation.  Discussed in detail with her again today.  She wants to think about it further. Echocardiogram shows normal systolic function.  Left atrial dilatation noted. TSH 1.25.  Essential hypertension, benign- (present on admission) Hypokalemia  Permissive hypertension is being allowed.  Prior to admission she was on HCTZ and metoprolol.  Metoprolol was started due to atrial fibrillation.  Resume her HCTZ at discharge. Replace potassium.  Magnesium is 1.8.  CKD (chronic kidney disease) stage 4, GFR 15-29 ml/min (HCC)- (present on admission) Seems to be close to baseline.  Monitor urine output.  Avoid nephrotoxic agents.  Type 2 diabetes mellitus without complication (HCC) PHX5A 6.3.  Monitor CBGs.  SSI.  Diet controlled at home.  Swelling of lower leg- (present on admission) Has a known history of pedal edema.  Was given 1 dose of Lasix.  Swelling has improved.  Echocardiogram shows normal systolic function  Aphasia- (present on admission) Has been going on x 2-3 years.  Mild cognitive impairment with memory loss- (present on admission) Seems to be at baseline.  Hypokalemia Continue to replace potassium.  Magnesium 1.8.      DVT Prophylaxis: Lovenox Code Status: DNR Family Communication: Discussed with patient Disposition Plan: SNF  Status is: Inpatient  Remains inpatient appropriate because: Acute stroke, need for rehab        Medications:  Scheduled:  aspirin EC  81 mg Oral Daily   clopidogrel  75 mg Oral q AM   enoxaparin (LOVENOX) injection  30 mg Subcutaneous Q24H   insulin aspart  0-9 Units Subcutaneous TID WC   ketorolac  1 drop Right Eye TID   LORazepam  0.5 mg Intravenous Once    metoprolol tartrate  25 mg Oral BID   Continuous: GGY:IRSWNIOEVOJJK **OR** acetaminophen (TYLENOL) oral liquid 160 mg/5 mL **OR** acetaminophen, lip balm, metoprolol tartrate, senna-docusate  Antibiotics: Anti-infectives (From admission, onward)    None       Objective:  Vital Signs  Vitals:   06/08/21 2345 06/09/21 0322 06/09/21 0630 06/09/21 0735  BP: (!) 163/86 (!) 183/106  (!) 180/76  Pulse: 76 75  99  Resp: 17 18  14   Temp: 97.7 F (36.5 C) 97.7 F (36.5 C)  98.1 F (36.7 C)  TempSrc: Oral Oral  Oral  SpO2: 98% 100%  98%  Weight:   54 kg   Height:        Intake/Output Summary (Last 24 hours) at 06/09/2021 1047 Last data filed at 06/08/2021 2100 Gross per 24 hour  Intake 120 ml  Output --  Net 120 ml   Filed Weights   06/07/21 2113 06/08/21 0500 06/09/21 0630  Weight: 60.2 kg 55.9 kg 54 kg    General appearance: Awake alert.  In no distress Resp: Clear to auscultation bilaterally.  Normal effort Cardio: S1-S2 is normal regular.  No S3-S4.  No rubs murmurs or bruit GI: Abdomen is soft.  Nontender nondistended.  Bowel sounds are present normal.  No masses organomegaly Extremities: No edema.  Full range of motion of lower extremities. Neurologic: Alert and oriented x3.  No focal neurological deficits.    Lab Results:  Data Reviewed: I have personally reviewed labs and imaging study reports  CBC: Recent Labs  Lab 06/07/21 1245 06/08/21 0256 06/09/21 0146  WBC 5.4 6.2 6.3  NEUTROABS 4.6  --   --   HGB 13.0 13.9 14.0  HCT 40.0 41.2 42.3  MCV 93.2 90.9 90.8  PLT 217 209 093    Basic Metabolic Panel: Recent Labs  Lab 06/07/21 1245 06/08/21 0256 06/09/21 0146  NA 145 145 143  K 3.9 3.2* 3.3*  CL 109 104 107  CO2 25 25 27   GLUCOSE 131* 97 97  BUN 26* 23 22  CREATININE 1.69* 1.58* 1.60*  CALCIUM 9.7 10.2 9.7  MG  --   --  1.8    GFR: Estimated Creatinine Clearance: 18.7 mL/min (A) (by C-G formula based on SCr of 1.6 mg/dL  (H)).   Coagulation Profile: Recent Labs  Lab 06/07/21 1245  INR 1.0     CBG: Recent Labs  Lab 06/07/21 2243 06/08/21 0605 06/08/21 1733 06/09/21 0653  GLUCAP 92 108* 119* 95    Lipid Profile: Recent Labs    06/08/21 0256  CHOL 150  HDL 76  LDLCALC 67  TRIG 36  CHOLHDL 2.0     Recent Results (from the past 240 hour(s))  Resp Panel by RT-PCR (Flu A&B, Covid) Nasopharyngeal Swab     Status: None   Collection Time: 06/07/21 12:45 PM   Specimen: Nasopharyngeal Swab; Nasopharyngeal(NP) swabs in vial transport medium  Result Value Ref Range Status   SARS Coronavirus 2 by RT PCR NEGATIVE NEGATIVE Final    Comment: (NOTE) SARS-CoV-2 target nucleic acids are NOT DETECTED.  The SARS-CoV-2 RNA is generally detectable in upper respiratory specimens during the acute  phase of infection. The lowest concentration of SARS-CoV-2 viral copies this assay can detect is 138 copies/mL. A negative result does not preclude SARS-Cov-2 infection and should not be used as the sole basis for treatment or other patient management decisions. A negative result may occur with  improper specimen collection/handling, submission of specimen other than nasopharyngeal swab, presence of viral mutation(s) within the areas targeted by this assay, and inadequate number of viral copies(<138 copies/mL). A negative result must be combined with clinical observations, patient history, and epidemiological information. The expected result is Negative.  Fact Sheet for Patients:  EntrepreneurPulse.com.au  Fact Sheet for Healthcare Providers:  IncredibleEmployment.be  This test is no t yet approved or cleared by the [place] FDA and  has been authorized for detection and/or diagnosis of SARS-CoV-2 by FDA under an Emergency Use Authorization (EUA). This EUA will remain  in effect (meaning this test can be used) for the duration of the COVID-19 declaration under  Section 564(b)(1) of the Act, 21 U.S.C.section 360bbb-3(b)(1), unless the authorization is terminated  or revoked sooner.       Influenza A by PCR NEGATIVE NEGATIVE Final   Influenza B by PCR NEGATIVE NEGATIVE Final    Comment: (NOTE) The Xpert Xpress SARS-CoV-2/FLU/RSV plus assay is intended as an aid in the diagnosis of influenza from Nasopharyngeal swab specimens and should not be used as a sole basis for treatment. Nasal washings and aspirates are unacceptable for Xpert Xpress SARS-CoV-2/FLU/RSV testing.  Fact Sheet for Patients: EntrepreneurPulse.com.au  Fact Sheet for Healthcare Providers: IncredibleEmployment.be  This test is not yet approved or cleared by the [place] FDA and has been authorized for detection and/or diagnosis of SARS-CoV-2 by FDA under an Emergency Use Authorization (EUA). This EUA will remain in effect (meaning this test can be used) for the duration of the COVID-19 declaration under Section 564(b)(1) of the Act, 21 U.S.C. section 360bbb-3(b)(1), unless the authorization is terminated or revoked.  Performed at [facility], [place] [address] [place], [place] [address]   Urine Culture     Status: None   Collection Time: 06/07/21  1:56 PM   Specimen: Urine, Clean Catch  Result Value Ref Range Status   Specimen Description URINE, CLEAN CATCH  Final   Special Requests NONE  Final   Culture   Final    NO GROWTH Performed at [facility], [place] [address], [place], [place] [address]    Report Status 06/08/2021 FINAL  Final      Radiology Studies: DG Chest 2 View  Result Date: 06/07/2021 CLINICAL DATA:  Shortness of breath with lower extremity edema. EXAM: CHEST - 2 VIEW COMPARISON:  05/07/2007. FINDINGS: Lungs are hyperexpanded. The lungs are clear without focal pneumonia, edema, pneumothorax or pleural effusion. The cardio pericardial silhouette is enlarged. Bones are diffusely demineralized.  Telemetry leads overlie the chest. IMPRESSION: Hyperexpansion without acute cardiopulmonary findings. Electronically Signed   By: Misty Stanley M.D.   On: 06/07/2021 13:20   CT HEAD WO CONTRAST (5MM)  Result Date: 06/07/2021 CLINICAL DATA:  Left eye vision problems EXAM: CT HEAD WITHOUT CONTRAST TECHNIQUE: Contiguous axial images were obtained from the base of the skull through the vertex without intravenous contrast. RADIATION DOSE REDUCTION: This exam was performed according to the departmental dose-optimization program which includes automated exposure control, adjustment of the mA and/or kV according to patient size and/or use of iterative reconstruction technique. COMPARISON:  Hypo CT dated January 05, 2019 FINDINGS: Brain: Chronic white matter ischemic  change. No evidence of acute infarction, hemorrhage, hydrocephalus, extra-axial collection or mass lesion/mass effect. Vascular: No hyperdense vessel or unexpected calcification. Skull: Normal. Negative for fracture or focal lesion. Sinuses/Orbits: No acute finding. Other: None. IMPRESSION: No acute intracranial abnormality. Electronically Signed   By: Yetta Glassman M.D.   On: 06/07/2021 13:37   MR BRAIN WO CONTRAST  Result Date: 06/07/2021 CLINICAL DATA:  Provided history: Neuro deficit, acute, stroke suspected. Additional history provided: Progressively worsening blurry vision, generalized weakness, left-sided gaze noted. EXAM: MRI HEAD WITHOUT CONTRAST TECHNIQUE: Multiplanar, multiecho pulse sequences of the brain and surrounding structures were obtained without intravenous contrast. COMPARISON:  Head CT 06/07/2021. Report from brain MRI 07/08/2000 (images unavailable). FINDINGS: Brain: Mild-to-moderate generalized cerebral atrophy. Comparatively mild cerebellar atrophy. 5 mm focus of restricted diffusion within the midline dorsal pons compatible with acute infarct (series 3, image 13) (series 4, image 16). Mild-to-moderate multifocal T2 FLAIR  hyperintense signal abnormality within the cerebral white matter, nonspecific but compatible chronic small vessel ischemic disease. Prominent perivascular space versus small chronic lacunar infarct within the left caudate head (series 10, image 17). Punctate chronic microhemorrhage within the left parietal lobe (series 8, image 15). Additional foci of T2* signal loss within the bilateral basal ganglia, which may reflect mineralization or additional punctate chronic microhemorrhages. No evidence of an intracranial mass. No extra-axial fluid collection. No midline shift. Vascular: Maintained flow voids within the proximal large arterial vessels. Skull and upper cervical spine: No focal suspicious marrow lesion. Incompletely assessed cervical spondylosis Sinuses/Orbits: Visualized orbits show no acute finding. Bilateral ocular lens replacements. Mild mucosal thickening within the bilateral ethmoid, sphenoid and maxillary sinuses. Other: Trace fluid within the bilateral mastoid air cells. IMPRESSION: 5 mm acute infarct within the midline dorsal pons. Mild-to-moderate chronic small vessel ischemic changes within the cerebral white matter. Mild-to-moderate generalized cerebral atrophy. Comparatively mild cerebellar atrophy. Mild paranasal sinus mucosal thickening. Trace fluid within the bilateral mastoid air cells. Electronically Signed   By: Kellie Simmering D.O.   On: 06/07/2021 15:32   DG Swallowing Func-Speech Pathology  Result Date: 06/08/2021 Table formatting from the original result was not included. Objective Swallowing Evaluation: Type of Study: MBS-Modified Barium Swallow Study  Patient Details Name: Mia [name] MRN: [id number] Date of Birth: 1925-06-01 Today's Date: 06/08/2021 Time: SLP Start Time (ACUTE ONLY): 27 -SLP Stop Time (ACUTE ONLY): 1400 SLP Time Calculation (min) (ACUTE ONLY): 19 min Past Medical History: Past Medical History: Diagnosis Date  Arthritis   oa  Chronic kidney disease   chronic kidney  diseaase follow by primary md  Diabetes mellitus without complication ([place])   Diet controlled  Former smoker   H/O ventricular fibrillation 10 yrs ago dx  Hypertension   Prediabetes   Seasonal allergies   TIA (transient ischemic attack) none recent Past Surgical History: Past Surgical History: Procedure Laterality Date  NM MYOCAR PERF WALL MOTION  01/2008  lexiscan myoview - normal pattern of perfusion in all regions, EF 92%, no significant ischemia demonstated  TONSILLECTOMY  age 66  and adenoids  TOTAL KNEE ARTHROPLASTY Right 10/06/2015  Procedure: RIGHT TOTAL KNEE ARTHROPLASTY;  Surgeon: [name], MD;  Location: [facility];  Service: Orthopedics;  Laterality: Right;  TRANSTHORACIC ECHOCARDIOGRAM  12/2005  mild DUST, EF=>55%; mild mitral annular calcif; mild-mod aortic root calcif HPI: [name] who presented to the ED with vision changes and several week history of weakness. MRI brain 1/19:  5 mm acute infarct within the midline dorsal pons. Pt failed  the [place] due to coughing. PMH: CKD, T2DM, HTN, gout, hx of TIA, VF.  No data recorded  Recommendations for follow up therapy are one component of a multi-disciplinary discharge planning process, led by the attending physician.  Recommendations may be updated based on patient status, additional functional criteria and insurance authorization. Assessment / Plan / Recommendation Clinical Impressions 06/08/2021 Clinical Impression Pt presents with mild pharyngeal dysphagia characterized by reduced tongue base retraction, and a pharyngeal delay. She demonstrated vallecular residue, and penetration (PAS 2, 3) with thin liquids via straw. A single instance of aspiration (PAS 7) was noted with when pt consumed the 4mm barium tablet with thin liquids via straw. Aspirate originated from pyriform sinus residue when pt was leaning forward. However, no other instances of aspiration were noted. Amount of vallecular residue was reduced with a liquid wash. A  chin tuck posture was effective in eliminating laryngeal invasion and reducing pharyngeal residue. Pt was elated by her perceived improvement in bolus movement with use of these strategies and she verbalized agreement with using them during meals. A dysphagia 3 diet with thin liquids is recommended with observance of swallowing precautions. SLP will continue to follow pt. SLP Visit Diagnosis Dysphagia, pharyngeal phase (R13.13) Attention and concentration deficit following -- Frontal lobe and executive function deficit following -- Impact on safety and function Mild aspiration risk   Treatment Recommendations 06/08/2021 Treatment Recommendations Therapy as outlined in treatment plan below   Prognosis 06/08/2021 Prognosis for Safe Diet Advancement Good Barriers to Reach Goals Cognitive deficits Barriers/Prognosis Comment -- Diet Recommendations 06/08/2021 SLP Diet Recommendations Dysphagia 3 (Mech soft) solids;Thin liquid Liquid Administration via Cup;Straw Medication Administration Whole meds with puree Compensations Slow rate;Small sips/bites;Chin tuck;Follow solids with liquid Postural Changes Seated upright at 90 degrees   Other Recommendations 06/08/2021 Recommended Consults -- Oral Care Recommendations Oral care BID Other Recommendations -- Follow Up Recommendations (No Data) Assistance recommended at discharge -- Functional Status Assessment Patient has had a recent decline in their functional status and demonstrates the ability to make significant improvements in function in a reasonable and predictable amount of time. Frequency and Duration  06/08/2021 Speech Therapy Frequency (ACUTE ONLY) min 2x/week Treatment Duration 2 weeks   Oral Phase 06/08/2021 Oral Phase WFL Oral - Pudding Teaspoon -- Oral - Pudding Cup -- Oral - Honey Teaspoon -- Oral - Honey Cup -- Oral - Nectar Teaspoon -- Oral - Nectar Cup -- Oral - Nectar Straw -- Oral - Thin Teaspoon -- Oral - Thin Cup -- Oral - Thin Straw -- Oral - Puree -- Oral -  Mech Soft -- Oral - Regular -- Oral - Multi-Consistency -- Oral - Pill -- Oral Phase - Comment --  Pharyngeal Phase 06/08/2021 Pharyngeal Phase Impaired Pharyngeal- Pudding Teaspoon -- Pharyngeal -- Pharyngeal- Pudding Cup -- Pharyngeal -- Pharyngeal- Honey Teaspoon -- Pharyngeal -- Pharyngeal- Honey Cup -- Pharyngeal -- Pharyngeal- Nectar Teaspoon -- Pharyngeal -- Pharyngeal- Nectar Cup -- Pharyngeal -- Pharyngeal- Nectar Straw Delayed swallow initiation-pyriform sinuses;Delayed swallow initiation-vallecula;Pharyngeal residue - valleculae;Reduced tongue base retraction Pharyngeal -- Pharyngeal- Thin Teaspoon -- Pharyngeal -- Pharyngeal- Thin Cup Delayed swallow initiation-pyriform sinuses;Delayed swallow initiation-vallecula;Pharyngeal residue - valleculae;Reduced tongue base retraction Pharyngeal -- Pharyngeal- Thin Straw Delayed swallow initiation-pyriform sinuses;Delayed swallow initiation-vallecula;Pharyngeal residue - valleculae;Reduced tongue base retraction;Penetration/Aspiration during swallow;Penetration/Apiration after swallow Pharyngeal Material enters airway, remains ABOVE vocal cords then ejected out;Material enters airway, remains ABOVE vocal cords and not ejected out Pharyngeal- Puree Delayed swallow initiation-pyriform sinuses;Delayed swallow initiation-vallecula;Pharyngeal residue - valleculae;Reduced tongue base retraction Pharyngeal --  Pharyngeal- Mechanical Soft Delayed swallow initiation-pyriform sinuses;Delayed swallow initiation-vallecula;Pharyngeal residue - valleculae;Reduced tongue base retraction Pharyngeal -- Pharyngeal- Regular Delayed swallow initiation-pyriform sinuses;Delayed swallow initiation-vallecula;Pharyngeal residue - valleculae;Reduced tongue base retraction Pharyngeal -- Pharyngeal- Multi-consistency -- Pharyngeal -- Pharyngeal- Pill Delayed swallow initiation-pyriform sinuses;Delayed swallow initiation-vallecula;Pharyngeal residue - valleculae;Reduced tongue base  retraction;Penetration/Apiration after swallow Pharyngeal Material enters airway, passes BELOW cords and not ejected out despite cough attempt by patient Pharyngeal Comment --  Cervical Esophageal Phase  06/08/2021 Cervical Esophageal Phase WFL Pudding Teaspoon -- Pudding Cup -- Honey Teaspoon -- Honey Cup -- Nectar Teaspoon -- Nectar Cup -- Nectar Straw -- Thin Teaspoon -- Thin Cup -- Thin Straw -- Puree -- Mechanical Soft -- Regular -- Multi-consistency -- Pill -- Cervical Esophageal Comment -- Shanika I. Hardin Negus, [place], [place] Office number [phone] Pager [phone] Horton Marshall 06/08/2021, 2:53 PM                     ECHOCARDIOGRAM COMPLETE  Result Date: 06/08/2021    ECHOCARDIOGRAM REPORT   Patient Name:   Mia [name] Date of Exam: 06/08/2021 Medical Rec #:  [id number]    Height:       67.0 in Accession #:    [id number]   Weight:       123.2 lb Date of Birth:  14-Oct-1925     BSA:          1.646 m Patient Age:    45 years     BP:           171/102 mmHg Patient Gender: F            HR:           79 bpm. Exam Location:  Inpatient Procedure: 2D Echo Indications:    Stroke  History:        Patient has prior history of Echocardiogram examinations, most                 recent 12/20/2005. Risk Factors:Hypertension and Diabetes.  Sonographer:    Jefferey Pica Referring Phys: [id number] [place]  1. Left ventricular ejection fraction, by estimation, is 60 to 65%. The left ventricle has normal function. The left ventricle has no regional wall motion abnormalities. There is mild concentric left ventricular hypertrophy of the basal segme nt No LVOT  obstruction  2. Left atrial size was severely dilated.  3. Right atrial size was moderately dilated.  4. Mild mitral valve regurgitation.  5. The aortic valve is grossly normal. Aortic valve regurgitation is trivial.  6. Right ventricular systolic function is normal. The right ventricular size is normal.  7. The inferior  vena cava is normal in size with greater than 50% respiratory variability, suggesting right atrial pressure of 3 mmHg. Comparison(s): No prior Echocardiogram. FINDINGS  Left Ventricle: Left ventricular ejection fraction, by estimation, is 60 to 65%. The left ventricle has normal function. The left ventricle has no regional wall motion abnormalities. The left ventricular internal cavity size was normal in size. There is  mild concentric left ventricular hypertrophy of the basal segment. Left ventricular diastolic parameters are consistent with Grade I diastolic dysfunction (impaired relaxation). Right Ventricle: The right ventricular size is normal. No increase in right ventricular wall thickness. Right ventricular systolic function is normal. The tricuspid regurgitant velocity is 2.28 m/s, and with an assumed right atrial pressure of 5 mmHg, the estimated right ventricular systolic pressure is 62.2 mmHg. Left Atrium: Left atrial size was severely dilated.  Right Atrium: Right atrial size was moderately dilated. Pericardium: There is no evidence of pericardial effusion. Mitral Valve: There is moderate calcification of the mitral valve leaflet(s). Mild mitral valve regurgitation. Tricuspid Valve: The tricuspid valve is grossly normal. Tricuspid valve regurgitation is trivial. Aortic Valve: The aortic valve is grossly normal. Aortic valve regurgitation is trivial. Aortic regurgitation PHT measures 454 msec. Aortic valve peak gradient measures 5.1 mmHg. Pulmonic Valve: The pulmonic valve was not well visualized. Pulmonic valve regurgitation is not visualized. Aorta: The aortic root and ascending aorta are structurally normal, with no evidence of dilitation. Venous: The inferior vena cava is normal in size with greater than 50% respiratory variability, suggesting right atrial pressure of 3 mmHg. IAS/Shunts: No atrial level shunt detected by color flow Doppler.  LEFT VENTRICLE PLAX 2D LVIDd:         4.00 cm   Diastology  LVIDs:         2.50 cm   LV e' lateral:   3.65 cm/s LV PW:         1.00 cm   LV E/e' lateral: 23.6 LV IVS:        1.00 cm LVOT diam:     1.90 cm LV SV:         47 LV SV Index:   28 LVOT Area:     2.84 cm  IVC IVC diam: 1.30 cm LEFT ATRIUM             Index        RIGHT ATRIUM           Index LA diam:        3.40 cm 2.07 cm/m   RA Area:     13.40 cm LA Vol (A2C):   93.7 ml 56.92 ml/m  RA Volume:   34.30 ml  20.84 ml/m LA Vol (A4C):   47.3 ml 28.73 ml/m LA Biplane Vol: 73.9 ml 44.89 ml/m  AORTIC VALVE                 PULMONIC VALVE AV Area (Vmax): 2.30 cm     PV Vmax:       0.78 m/s AV Vmax:        113.40 cm/s  PV Peak grad:  2.4 mmHg AV Peak Grad:   5.1 mmHg LVOT Vmax:      91.95 cm/s LVOT Vmean:     54.600 cm/s LVOT VTI:       0.165 m AI PHT:         454 msec  AORTA Ao Root diam: 3.10 cm Ao Asc diam:  3.00 cm MITRAL VALVE                TRICUSPID VALVE MV Area (PHT): 1.93 cm     TR Peak grad:   20.8 mmHg MV Decel Time: 393 msec     TR Vmax:        228.00 cm/s MR Peak grad: 174.8 mmHg MR Vmax:      661.00 cm/s   SHUNTS MV E velocity: 86.30 cm/s   Systemic VTI:  0.17 m MV A velocity: 133.00 cm/s  Systemic Diam: 1.90 cm MV E/A ratio:  0.65 Mary Scientist, physiological signed by Phineas Inches Signature Date/Time: 06/08/2021/11:24:26 AM    Final    VAS US CAROTID  Result Date: 06/08/2021 Carotid Arterial Duplex Study Patient Name:  Mia [name]  Date of Exam:   06/08/2021 Medical Rec #: [id number]     Accession #:  [id number] Date of Birth: 29-Oct-1925      Patient Gender: F Patient Age:   48 years Exam Location:  [facility] Procedure:      VAS US CAROTID Referring Phys: PRAMOD SETHI --------------------------------------------------------------------------------  Indications:  CVA. Risk Factors: Hypertension, Diabetes, past history of smoking. Limitations   Today's exam was limited due to the high bifurcation of the               carotid. Performing Technologist: Darlin Coco RDMS, RVT  Examination  Guidelines: A complete evaluation includes B-mode imaging, spectral Doppler, color Doppler, and power Doppler as needed of all accessible portions of each vessel. Bilateral testing is considered an integral part of a complete examination. Limited examinations for reoccurring indications may be performed as noted.  Right Carotid Findings: +----------+--------+--------+--------+------------------+--------+             PSV cm/s EDV cm/s Stenosis Plaque Description Comments  +----------+--------+--------+--------+------------------+--------+  CCA Prox   48       9                                              +----------+--------+--------+--------+------------------+--------+  CCA Distal 44       12                                             +----------+--------+--------+--------+------------------+--------+  ICA Prox   54       17       1-39%    heterogenous                 +----------+--------+--------+--------+------------------+--------+  ICA Distal 98       34                                             +----------+--------+--------+--------+------------------+--------+  ECA        56                                                      +----------+--------+--------+--------+------------------+--------+ +----------+--------+-------+----------------+-------------------+             PSV cm/s EDV cms Describe         Arm Pressure (mmHG)  +----------+--------+-------+----------------+-------------------+  Subclavian 102              Multiphasic, WNL                      +----------+--------+-------+----------------+-------------------+ +---------+--------+--+--------+--+----------+  Vertebral PSV cm/s 44 EDV cm/s 20 Retrograde  +---------+--------+--+--------+--+----------+  Left Carotid Findings: +----------+--------+--------+--------+------------------+------------------+             PSV cm/s EDV cm/s Stenosis Plaque Description Comments             +----------+--------+--------+--------+------------------+------------------+  CCA Prox   60       10                                                       +----------+--------+--------+--------+------------------+------------------+  CCA Distal 49       10                                                       +----------+--------+--------+--------+------------------+------------------+  ICA Prox   93       30                                   intimal thickening  +----------+--------+--------+--------+------------------+------------------+  ICA Distal 82       23                                                       +----------+--------+--------+--------+------------------+------------------+  ECA        65                                                                +----------+--------+--------+--------+------------------+------------------+ +----------+--------+--------+----------------+-------------------+             PSV cm/s EDV cm/s Describe         Arm Pressure (mmHG)  +----------+--------+--------+----------------+-------------------+  Subclavian 157               Multiphasic, WNL                      +----------+--------+--------+----------------+-------------------+ +---------+--------+--------+--------------+  Vertebral PSV cm/s EDV cm/s Not identified  +---------+--------+--------+--------------+ No recent prior studies.  Summary: Right Carotid: Velocities in the right ICA are consistent with a 1-39% stenosis. Left Carotid: The extracranial vessels were near-normal with only minimal wall               thickening or plaque. Vertebrals:  Right vertebral artery demonstrates retrograde flow. Left vertebral              artery was not visualized. Subclavians: Normal flow hemodynamics were seen in bilateral subclavian              arteries. *See table(s) above for measurements and observations.  Electronically signed by Antony Contras MD on 06/08/2021 at 1:46:16 PM.    Final    VAS [place] TRANSCRANIAL  DOPPLER  Result Date: 06/08/2021  Transcranial Doppler Patient Name:  Mia [name]  Date of Exam:   06/08/2021 Medical Rec #: [id number]     Accession #:    [id number] Date of Birth: 1925/07/27      Patient Gender: F Patient Age:   66 years Exam [facility] Procedure:      VAS [place] TRANSCRANIAL DOPPLER Referring Phys: PRAMOD SETHI --------------------------------------------------------------------------------  Indications: Stroke. Limitations for diagnostic windows: Unable to insonate right transtemporal window. Unable to insonate left transtemporal window. Comparison Study: No prior studies. Performing Technologist: Darlin Coco RDMS, RVT  Examination Guidelines: A complete evaluation includes B-mode imaging, spectral Doppler, color Doppler, and power Doppler as needed of all accessible portions of each vessel. Bilateral testing is considered an integral  part of a complete examination. Limited examinations for reoccurring indications may be performed as noted.  +----------+-------------+----------+-----------+------------------+  RIGHT TCD  Right VM (cm) Depth (cm) Pulsatility      Comment        +----------+-------------+----------+-----------+------------------+  MCA                                             Unable to insonate  +----------+-------------+----------+-----------+------------------+  ACA                                             Unable to insonate  +----------+-------------+----------+-----------+------------------+  Term ICA                                        Unable to insonate  +----------+-------------+----------+-----------+------------------+  PCA                                             Unable to insonate  +----------+-------------+----------+-----------+------------------+  Opthalmic      17.00                   1.17                         +----------+-------------+----------+-----------+------------------+  ICA siphon     40.00                   1.13                          +----------+-------------+----------+-----------+------------------+  Vertebral      10.00                   0.87                         +----------+-------------+----------+-----------+------------------+  +----------+------------+----------+-----------+------------------+  LEFT TCD   Left VM (cm) Depth (cm) Pulsatility      Comment        +----------+------------+----------+-----------+------------------+  MCA                                            Unable to insonate  +----------+------------+----------+-----------+------------------+  ACA                                            Unable to insonate  +----------+------------+----------+-----------+------------------+  Term ICA                                       Unable to insonate  +----------+------------+----------+-----------+------------------+  PCA  Unable to insonate  +----------+------------+----------+-----------+------------------+  Opthalmic     16.00                   1.36                         +----------+------------+----------+-----------+------------------+  ICA siphon    20.00                   1.80                         +----------+------------+----------+-----------+------------------+  Vertebral     -14.00                  1.25                         +----------+------------+----------+-----------+------------------+  +------------+------+-------+               VM cm  Comment  +------------+------+-------+  Prox Basilar -17.00          +------------+------+-------+ Summary:  Absent bitemporal and poor suboccipital window limits evaluation of anterior and posterior circulation vessels.Antegrade flow in both opthalmic and carotid siphons noted. *See table(s) above for TCD measurements and observations.  Diagnosing physician: Antony Contras MD Electronically signed by Antony Contras MD on 06/08/2021 at 1:49:33 PM.    Final        LOS: 1 day   [facility] Pager on [email or web address]  06/09/2021, 10:47 AM

## 2021-06-09 NOTE — Progress Notes (Addendum)
STROKE TEAM PROGRESS NOTE   ATTENDING NOTE: I reviewed above note and agree with the assessment and plan. Pt was seen and examined.   88 year old female with history of CKD, diabetes, hypertension, TIA admitted for diplopia.  CT no acute abnormality.  MRI showed right paramedian dorsal pontine small infarct.  Carotid Doppler negative.  MRA pending.  EF 60 to 65%.  LDL 67 and A1c 6.1.  Creatinine 1.58.  On exam, patient sitting in chair, awake alert, orientated x3, no aphasia, fluent language, follows simple commands.  Able to name and repeat.  No visual field deficit, however significant ophthalmoplegia.  With left eye abduction, right eye midline with no movement bilaterally.  Partial upward and downward gaze preserved bilaterally.  No significant facial droop, moving all extremities symmetrically.  Sensation and coronation intact.  Patient etiology for stroke likely small vessel disease, involving right PPRF and b/l MLF as well as right CN VI.  Continue aggressive stroke risk factor modification.  Continue aspirin 81 and Plavix 75 DAPT for 3 weeks and then Plavix alone.  Okay with no statin given advanced age and LDL at goal.  We will follow  For detailed assessment and plan, please refer to above as I have made changes wherever appropriate.   Mia Hawking, MD PhD Stroke Neurology 06/09/2021 6:28 PM    INTERVAL HISTORY Patient is seen in her room with no family at the bedside.  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  She reports that her diplopia is unchanged but that the glasses given to her by OT are helpful.  MRA is pending.  Vitals:   06/08/21 2345 06/09/21 0322 06/09/21 0630 06/09/21 0735  BP: (!) 163/86 (!) 183/106  (!) 180/76  Pulse: 76 75  99  Resp: 17 18  14   Temp: 97.7 F (36.5 C) 97.7 F (36.5 C)  98.1 F (36.7 C)  TempSrc: Oral Oral  Oral  SpO2: 98% 100%  98%  Weight:   54 kg   Height:       CBC:  Recent Labs  Lab 06/07/21 1245  06/08/21 0256 06/09/21 0146  WBC 5.4 6.2 6.3  NEUTROABS 4.6  --   --   HGB 13.0 13.9 14.0  HCT 40.0 41.2 42.3  MCV 93.2 90.9 90.8  PLT 217 209 572    Basic Metabolic Panel:  Recent Labs  Lab 06/08/21 0256 06/09/21 0146  NA 145 143  K 3.2* 3.3*  CL 104 107  CO2 25 27  GLUCOSE 97 97  BUN 23 22  CREATININE 1.58* 1.60*  CALCIUM 10.2 9.7  MG  --  1.8    Lipid Panel:  Recent Labs  Lab 06/08/21 0256  CHOL 150  TRIG 36  HDL 76  CHOLHDL 2.0  VLDL 7  LDLCALC 67    HgbA1c:  Recent Labs  Lab 06/08/21 0256  HGBA1C 6.3*   Urine Drug Screen: No results for input(s): LABOPIA, COCAINSCRNUR, LABBENZ, AMPHETMU, THCU, LABBARB in the last 168 hours.  Alcohol Level No results for input(s): ETH in the last 168 hours.  IMAGING past 24 hours DG Swallowing Func-Speech Pathology  Result Date: 06/08/2021 Table formatting from the original result was not included. Objective Swallowing Evaluation: Type of Study: MBS-Modified Barium Swallow Study  Patient Details Name: Mia [name] MRN: [id number] Date of Birth: July 07, 1925 Today's Date: 06/08/2021 Time: SLP Start Time (ACUTE ONLY): 1638 -SLP Stop Time (ACUTE ONLY): 1400 SLP Time Calculation (min) (ACUTE ONLY): 19 min Past Medical  History: Past Medical History: Diagnosis Date  Arthritis   oa  Chronic kidney disease   chronic kidney diseaase follow by primary md  Diabetes mellitus without complication ([place])   Diet controlled  Former smoker   H/O ventricular fibrillation 10 yrs ago dx  Hypertension   Prediabetes   Seasonal allergies   TIA (transient ischemic attack) none recent Past Surgical History: Past Surgical History: Procedure Laterality Date  NM MYOCAR PERF WALL MOTION  01/2008  lexiscan myoview - normal pattern of perfusion in all regions, EF 92%, no significant ischemia demonstated  TONSILLECTOMY  age 62  and adenoids  TOTAL KNEE ARTHROPLASTY Right 10/06/2015  Procedure: RIGHT TOTAL KNEE ARTHROPLASTY;  Surgeon: [name], MD;   Location: [facility];  Service: Orthopedics;  Laterality: Right;  TRANSTHORACIC ECHOCARDIOGRAM  12/2005  mild DUST, EF=>55%; mild mitral annular calcif; mild-mod aortic root calcif HPI: [name] who presented to the ED with vision changes and several week history of weakness. MRI brain 1/19:  5 mm acute infarct within the midline dorsal pons. Pt failed the [place] due to coughing. PMH: CKD, T2DM, HTN, gout, hx of TIA, VF.  No data recorded  Recommendations for follow up therapy are one component of a multi-disciplinary discharge planning process, led by the attending physician.  Recommendations may be updated based on patient status, additional functional criteria and insurance authorization. Assessment / Plan / Recommendation Clinical Impressions 06/08/2021 Clinical Impression Pt presents with mild pharyngeal dysphagia characterized by reduced tongue base retraction, and a pharyngeal delay. She demonstrated vallecular residue, and penetration (PAS 2, 3) with thin liquids via straw. A single instance of aspiration (PAS 7) was noted with when pt consumed the 87mm barium tablet with thin liquids via straw. Aspirate originated from pyriform sinus residue when pt was leaning forward. However, no other instances of aspiration were noted. Amount of vallecular residue was reduced with a liquid wash. A chin tuck posture was effective in eliminating laryngeal invasion and reducing pharyngeal residue. Pt was elated by her perceived improvement in bolus movement with use of these strategies and she verbalized agreement with using them during meals. A dysphagia 3 diet with thin liquids is recommended with observance of swallowing precautions. SLP will continue to follow pt. SLP Visit Diagnosis Dysphagia, pharyngeal phase (R13.13) Attention and concentration deficit following -- Frontal lobe and executive function deficit following -- Impact on safety and function Mild aspiration risk   Treatment Recommendations 06/08/2021  Treatment Recommendations Therapy as outlined in treatment plan below   Prognosis 06/08/2021 Prognosis for Safe Diet Advancement Good Barriers to Reach Goals Cognitive deficits Barriers/Prognosis Comment -- Diet Recommendations 06/08/2021 SLP Diet Recommendations Dysphagia 3 (Mech soft) solids;Thin liquid Liquid Administration via Cup;Straw Medication Administration Whole meds with puree Compensations Slow rate;Small sips/bites;Chin tuck;Follow solids with liquid Postural Changes Seated upright at 90 degrees   Other Recommendations 06/08/2021 Recommended Consults -- Oral Care Recommendations Oral care BID Other Recommendations -- Follow Up Recommendations (No Data) Assistance recommended at discharge -- Functional Status Assessment Patient has had a recent decline in their functional status and demonstrates the ability to make significant improvements in function in a reasonable and predictable amount of time. Frequency and Duration  06/08/2021 Speech Therapy Frequency (ACUTE ONLY) min 2x/week Treatment Duration 2 weeks   Oral Phase 06/08/2021 Oral Phase WFL Oral - Pudding Teaspoon -- Oral - Pudding Cup -- Oral - Honey Teaspoon -- Oral - Honey Cup -- Oral - Nectar Teaspoon -- Oral - Nectar Cup --  Oral - Nectar Straw -- Oral - Thin Teaspoon -- Oral - Thin Cup -- Oral - Thin Straw -- Oral - Puree -- Oral - Mech Soft -- Oral - Regular -- Oral - Multi-Consistency -- Oral - Pill -- Oral Phase - Comment --  Pharyngeal Phase 06/08/2021 Pharyngeal Phase Impaired Pharyngeal- Pudding Teaspoon -- Pharyngeal -- Pharyngeal- Pudding Cup -- Pharyngeal -- Pharyngeal- Honey Teaspoon -- Pharyngeal -- Pharyngeal- Honey Cup -- Pharyngeal -- Pharyngeal- Nectar Teaspoon -- Pharyngeal -- Pharyngeal- Nectar Cup -- Pharyngeal -- Pharyngeal- Nectar Straw Delayed swallow initiation-pyriform sinuses;Delayed swallow initiation-vallecula;Pharyngeal residue - valleculae;Reduced tongue base retraction Pharyngeal -- Pharyngeal- Thin Teaspoon --  Pharyngeal -- Pharyngeal- Thin Cup Delayed swallow initiation-pyriform sinuses;Delayed swallow initiation-vallecula;Pharyngeal residue - valleculae;Reduced tongue base retraction Pharyngeal -- Pharyngeal- Thin Straw Delayed swallow initiation-pyriform sinuses;Delayed swallow initiation-vallecula;Pharyngeal residue - valleculae;Reduced tongue base retraction;Penetration/Aspiration during swallow;Penetration/Apiration after swallow Pharyngeal Material enters airway, remains ABOVE vocal cords then ejected out;Material enters airway, remains ABOVE vocal cords and not ejected out Pharyngeal- Puree Delayed swallow initiation-pyriform sinuses;Delayed swallow initiation-vallecula;Pharyngeal residue - valleculae;Reduced tongue base retraction Pharyngeal -- Pharyngeal- Mechanical Soft Delayed swallow initiation-pyriform sinuses;Delayed swallow initiation-vallecula;Pharyngeal residue - valleculae;Reduced tongue base retraction Pharyngeal -- Pharyngeal- Regular Delayed swallow initiation-pyriform sinuses;Delayed swallow initiation-vallecula;Pharyngeal residue - valleculae;Reduced tongue base retraction Pharyngeal -- Pharyngeal- Multi-consistency -- Pharyngeal -- Pharyngeal- Pill Delayed swallow initiation-pyriform sinuses;Delayed swallow initiation-vallecula;Pharyngeal residue - valleculae;Reduced tongue base retraction;Penetration/Apiration after swallow Pharyngeal Material enters airway, passes BELOW cords and not ejected out despite cough attempt by patient Pharyngeal Comment --  Cervical Esophageal Phase  06/08/2021 Cervical Esophageal Phase WFL Pudding Teaspoon -- Pudding Cup -- Honey Teaspoon -- Honey Cup -- Nectar Teaspoon -- Nectar Cup -- Nectar Straw -- Thin Teaspoon -- Thin Cup -- Thin Straw -- Puree -- Mechanical Soft -- Regular -- Multi-consistency -- Pill -- Cervical Esophageal Comment -- Mia [name], [place], [place] Office number [phone] Pager [place]  06/08/2021, 2:53 PM                      PHYSICAL EXAM General:  Alert, thin appearing female patient in no acute distress   NEURO:  Mental Status: AA&Ox3  Speech/Language: speech is without dysarthria or aphasia.    Cranial Nerves:  II: PERRL. Visual fields full.  III, IV, VI: EOMI shows a 1-1/2 syndrome.  Complete left gaze horizontal palsy.  Left eye is not able to move nasally and right eye is able to AB duct but shows nystagmus.  No vertical gaze limitation.  Patient has binocular diplopia in the horizontal plane. V: Sensation is intact to light touch and symmetrical to face.  VII: Smile is symmetrical.  VIII: hearing intact to voice. IX, X: Palate elevates symmetrically. Phonation is normal.  XII: tongue is midline without fasciculations. Motor: 5/5 strength to all muscle groups tested.  Sensation- Intact to light touch bilaterally.  Coordination: FTN intact bilaterally.  No drift.  Gait- deferred   ASSESSMENT/PLAN Mia [name] is a 88 y.o. female with history of CKD, T2DM, HTN, gout, TIA and VF presenting with diplopia upon awakening yesterday.  She then and presented to the ED.  MRI revealed a small pontine stroke.  Exam findings reveal one and a half syndrome with no horizontal movement of the right eye.  Glasses provided by OT are helpful in managing diplopia.  MRA pending  Stroke:   Paramedian pontine infarct  likely secondary to small vessel disease CT head No acute abnormality.  MRI  36mm acute infarct in midline dorsal pons MRA  pending Carotid Doppler  1-39% stenosis in right carotid, near normal left carotid, retrograde flow in right vertebral artery 2D Echo EF 60-65%, mild LVH,  severely dilated left atrium, no atrial level shunt LDL 67 HgbA1c 6.1 VTE prophylaxis - lovenox    Diet   DIET DYS 3 Room service appropriate? Yes with Assist; Fluid consistency: Thin   clopidogrel 75 mg daily prior to admission, now on aspirin 81 mg daily and clopidogrel 75 mg  daily.x 3 weeks and then aspirin alone Therapy recommendations:  rehab at SNF Disposition:  pending  Hypertension Home meds:  HCTZ 12.5 mg every other day Stable Permissive hypertension (OK if < 220/120) but gradually normalize in 5-7 days Long-term BP goal normotensive  Hyperlipidemia Home meds:  none LDL 67, goal < 70 High intensity statin not indicated as LDL below goal Continue statin at discharge  Diabetes type II Controlled Home meds:  none HgbA1c 6.1, goal < 7.0 CBGs SSI  Other Stroke Risk Factors Advanced Age >/= 66  Former cigarette smoker Hx TIA  Other Active Problems none  Hospital day # [place] , MSN, AGACNP-BC [facility] See Amion for schedule and pager information 06/09/2021 12:04 PM   To contact Stroke Continuity provider, please refer to [email or web address] After hours, contact General Neurology

## 2021-06-10 ENCOUNTER — Inpatient Hospital Stay (HOSPITAL_COMMUNITY): Payer: No Typology Code available for payment source

## 2021-06-10 DIAGNOSIS — G3184 Mild cognitive impairment, so stated: Secondary | ICD-10-CM

## 2021-06-10 DIAGNOSIS — I679 Cerebrovascular disease, unspecified: Secondary | ICD-10-CM

## 2021-06-10 DIAGNOSIS — I6312 Cerebral infarction due to embolism of basilar artery: Secondary | ICD-10-CM

## 2021-06-10 LAB — BASIC METABOLIC PANEL
Anion gap: 9 (ref 5–15)
BUN: 24 mg/dL — ABNORMAL HIGH (ref 8–23)
CO2: 25 mmol/L (ref 22–32)
Calcium: 9.4 mg/dL (ref 8.9–10.3)
Chloride: 110 mmol/L (ref 98–111)
Creatinine, Ser: 1.56 mg/dL — ABNORMAL HIGH (ref 0.44–1.00)
GFR, Estimated: 31 mL/min — ABNORMAL LOW (ref >60)
Glucose, Bld: 103 mg/dL — ABNORMAL HIGH (ref 70–99)
Potassium: 4.4 mmol/L (ref 3.5–5.1)
Sodium: 144 mmol/L (ref 135–145)

## 2021-06-10 LAB — GLUCOSE, CAPILLARY
Glucose-Capillary: 95 mg/dL (ref 70–99)
Glucose-Capillary: 90 mg/dL (ref 70–99)
Glucose-Capillary: 100 mg/dL — ABNORMAL HIGH (ref 70–99)
Glucose-Capillary: 90 mg/dL (ref 70–99)

## 2021-06-10 IMAGING — MR MR MRA HEAD W/O CM
1 series · 19 of 48 positions shown · non-contrast
Comparison: MRI [DATE].

CLINICAL DATA: [AGE] female with weakness and vision changes.
Recent lacunar infarct in the dorsal brainstem on MRI.

EXAM:
MRA HEAD WITHOUT CONTRAST
TECHNIQUE: Angiographic images of the Circle of Willis were acquired using MRA
technique without intravenous contrast.

[Series 5: 3d cow · axial · 0.5mm · 0.41mm/px · z∈[-68,+12]mm · 19 of 172 slices shown]
[im 1/172]
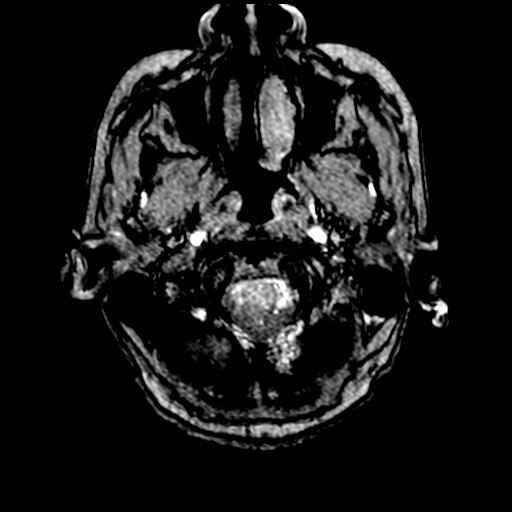
[im 4/172]
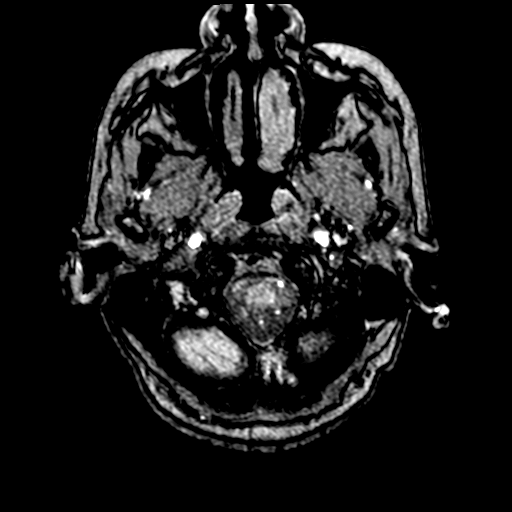
[im 8/172]
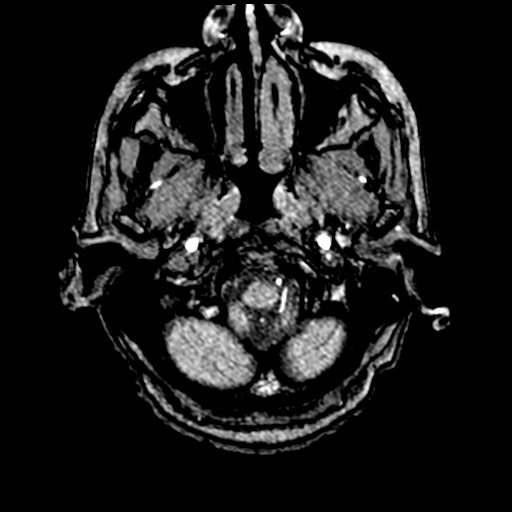
[im 11/172]
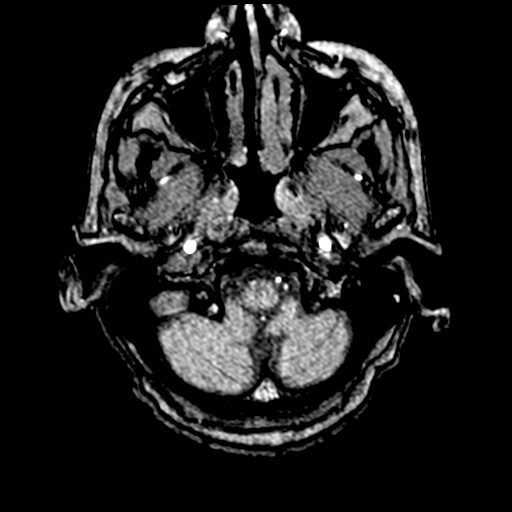
[im 15/172]
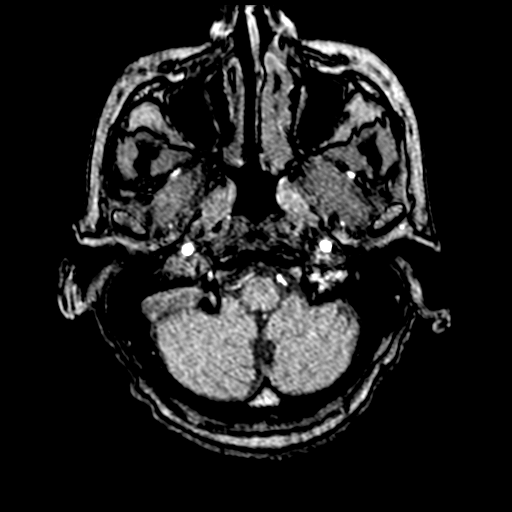
[im 19/172]
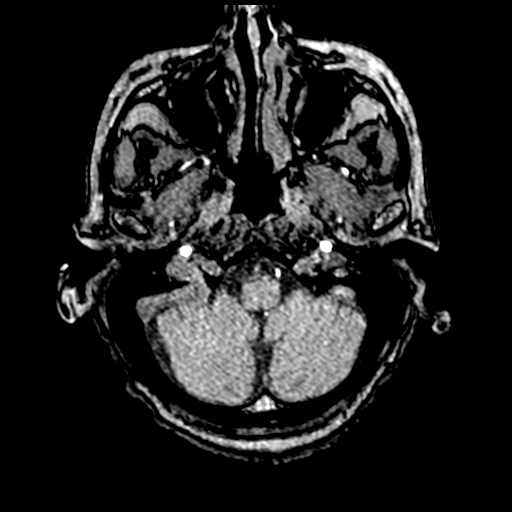
[im 22/172]
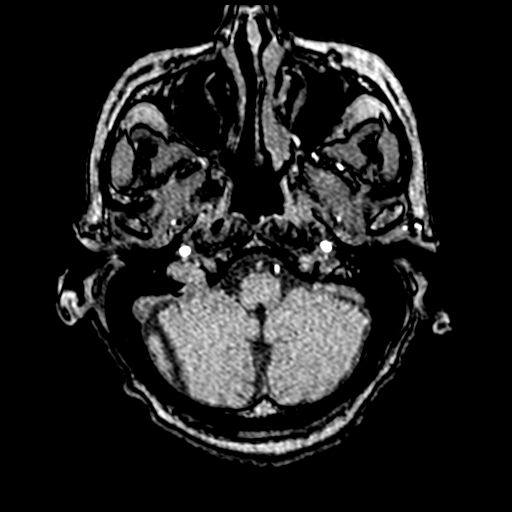
[im 26/172]
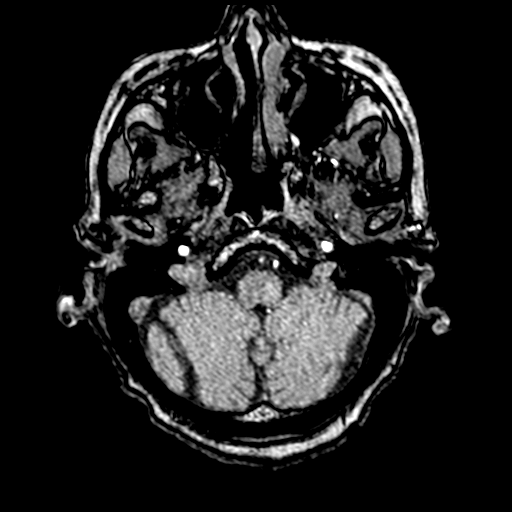
[im 30/172]
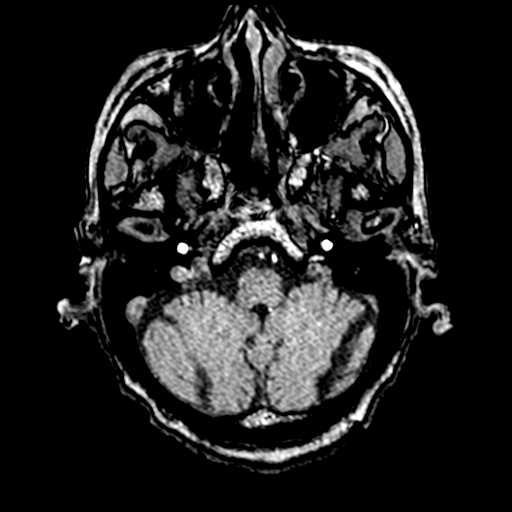
[im 33/172]
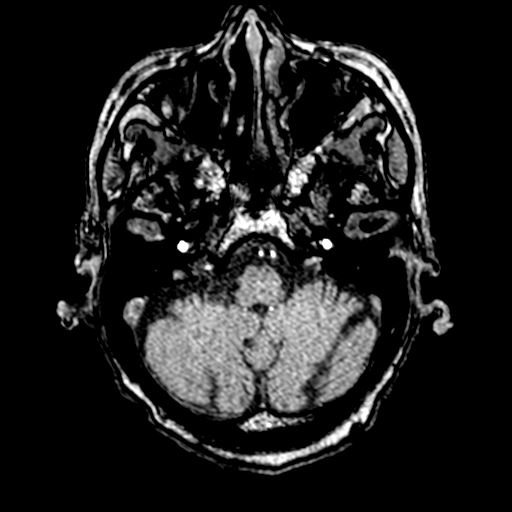
[im 37/172]
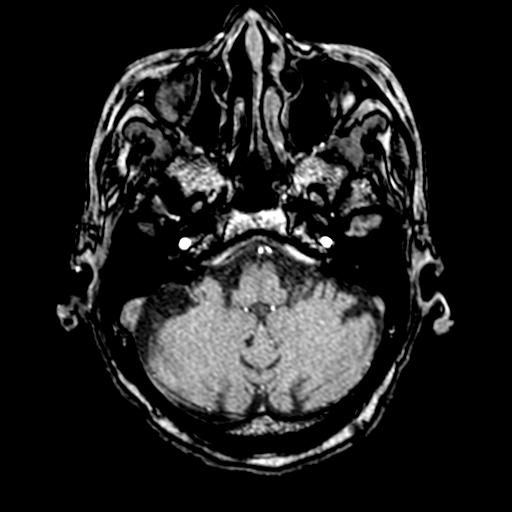
[im 55/172]
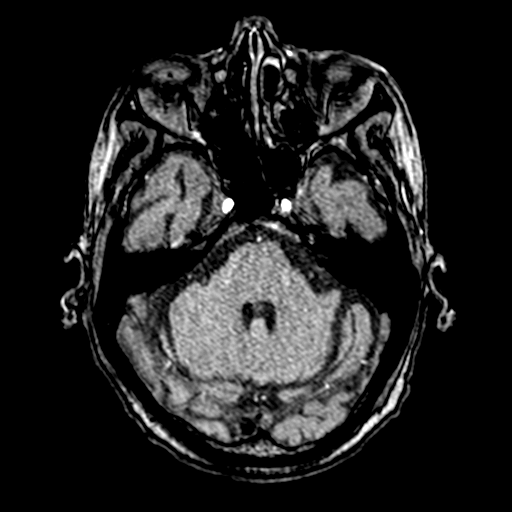
[im 77/172]
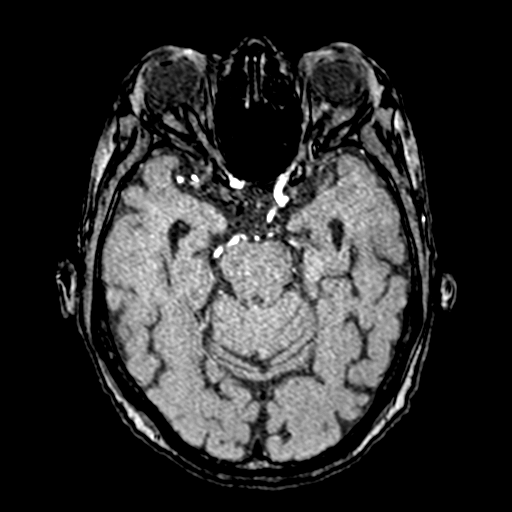
[im 88/172]
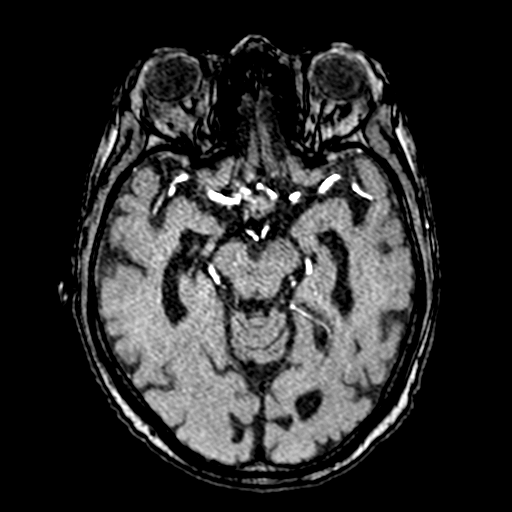
[im 99/172]
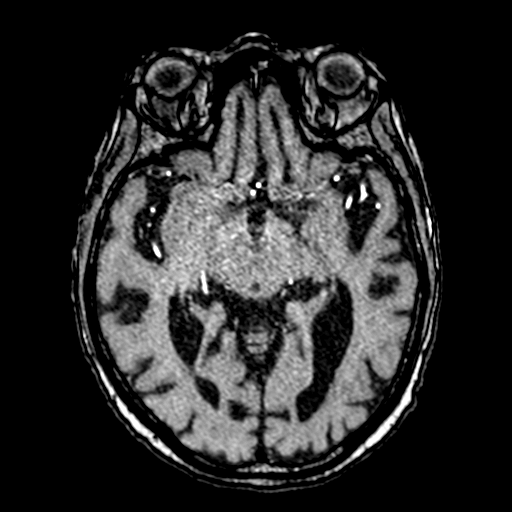
[im 121/172]
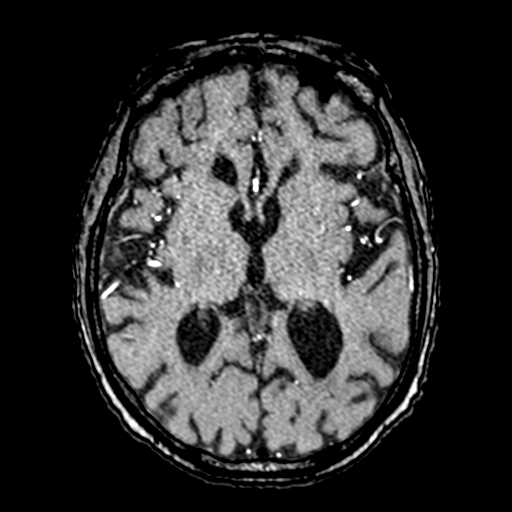
[im 142/172]
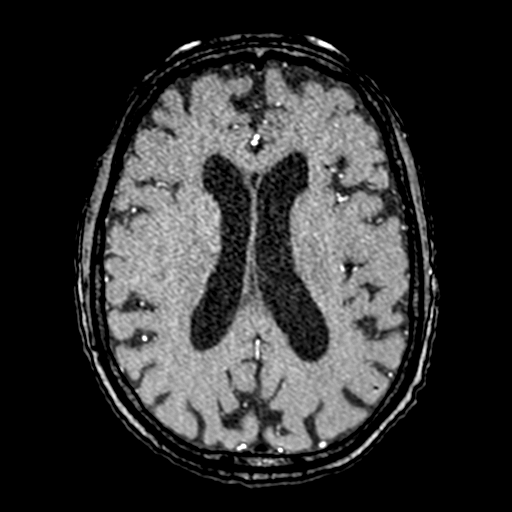
[im 146/172]
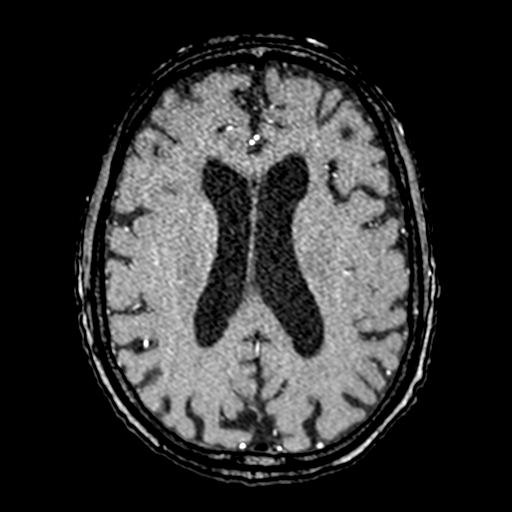
[im 164/172]
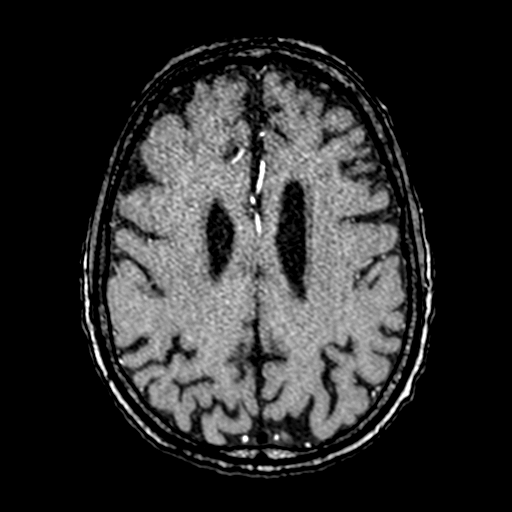

[19 of 48 positions shown; findings below may reference images not displayed]

FINDINGS: Anterior circulation: Antegrade flow in both ICA siphons. Bilateral
siphon irregularity and evidence of calcified plaque. Mild to
moderate left anterior genu stenosis. Mild right anterior genu and
supraclinoid stenosis. Normal posterior communicating artery
origins. Patent carotid termini. Patent MCA and ACA origins.
Diminutive or absent anterior communicating artery. Visible ACA
branches are within normal limits. MCA M1 segments and bifurcations
are patent without stenosis. Visible MCA branches are within normal
limits.

Posterior circulation: Diminutive vertebrobasilar system on the
basis of fetal type bilateral PCA origins. Distal left vertebral
artery antegrade flow appears normal. The distal right vertebral
artery is diminutive with decreased flow. However, both PICA origins
seem to remain patent. Patent vertebrobasilar junction. Highly
diminutive basilar artery with mild to moderate mid basilar
irregularity and stenosis (series [BO], image 11). Patent SCA and
PCA origins. Bilateral PCA branches are within normal limits.

Anatomic variants: Bilateral fetal PCA origins.

Other: No intracranial mass effect or ventriculomegaly.
IMPRESSION: 1. Negative for large vessel occlusion.

2. Positive for intracranial atherosclerosis, superimposed on
diminutive vertebrobasilar system on the basis of fetal type
bilateral PCA origins.
Mild to moderate mid Basilar Artery stenosis.
Attenuated flow in the distal Right Vertebral Artery raising the
possibility of upstream stenosis.
Mild to moderate bilateral ICA Siphon stenosis greater on the left.

## 2021-06-10 IMAGING — CT CT HEAD W/O CM
3 series · 17 of 37 positions shown, 19 images · non-contrast
Comparison: [DATE] MR and CT, and prior studies

CLINICAL DATA: [AGE] female with recent pontine infarct.



[Series 3: head wo · axial · 0.42mm/px · z∈[-40,+80]mm · 7 of 33 slices shown, 9 images]
[im 5/33  brain]
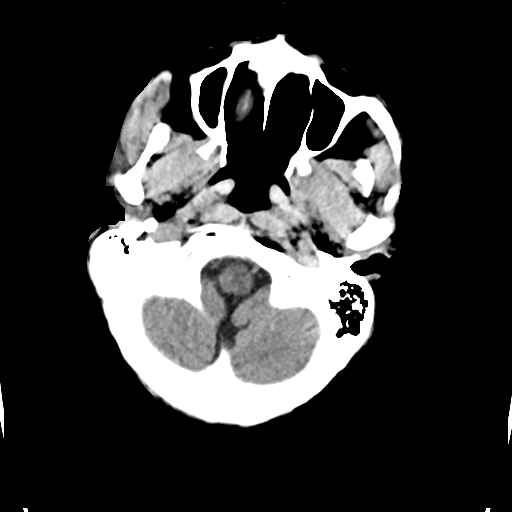
[im 5/33  bone]
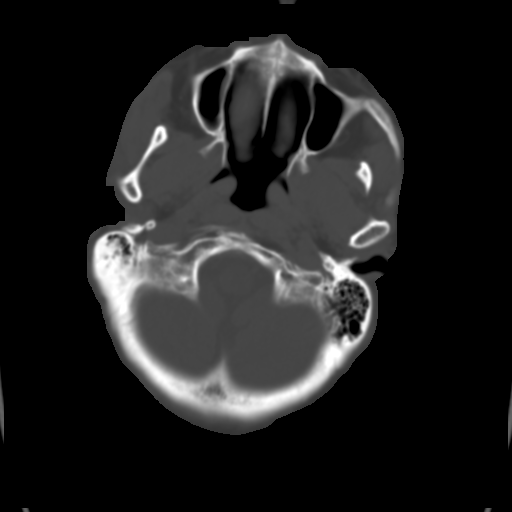
[im 9/33  brain]
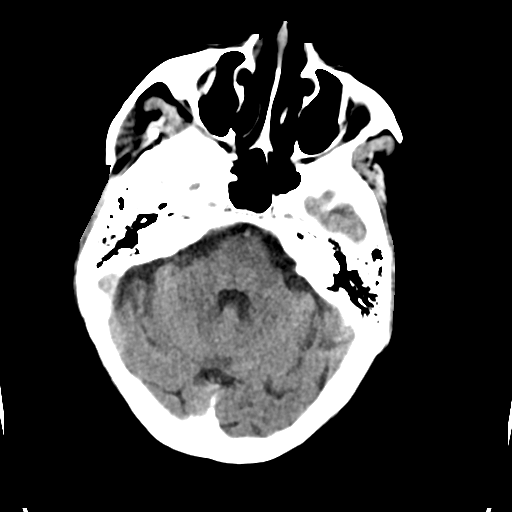
[im 13/33  brain]
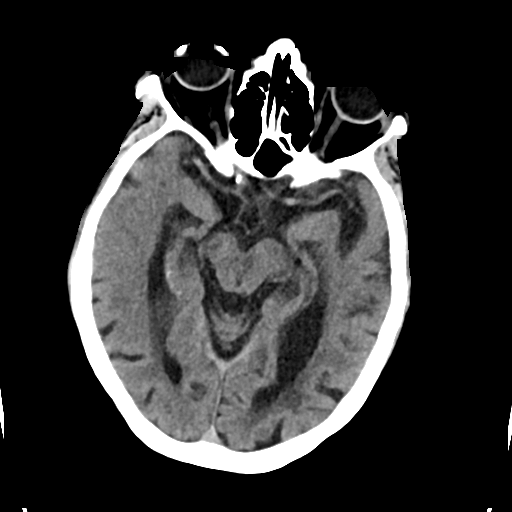
[im 17/33  brain]
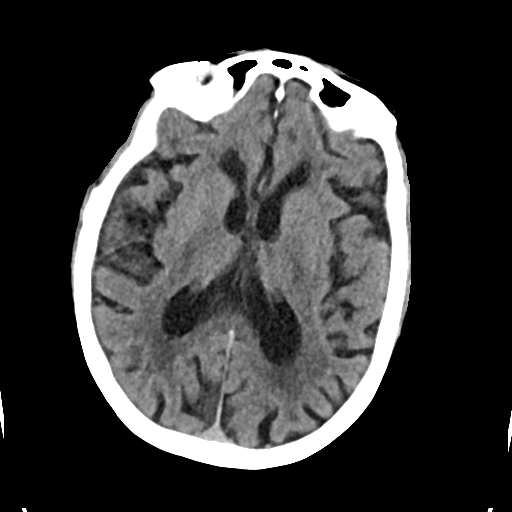
[im 21/33  brain]
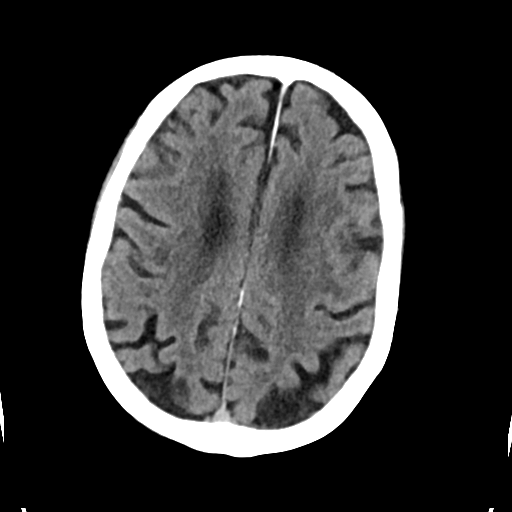
[im 21/33  bone]
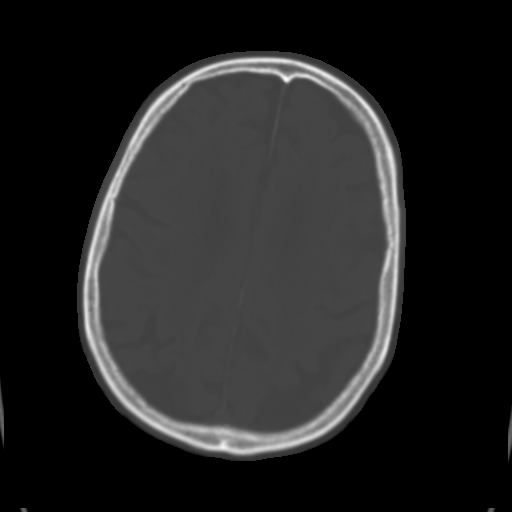
[im 25/33  brain]
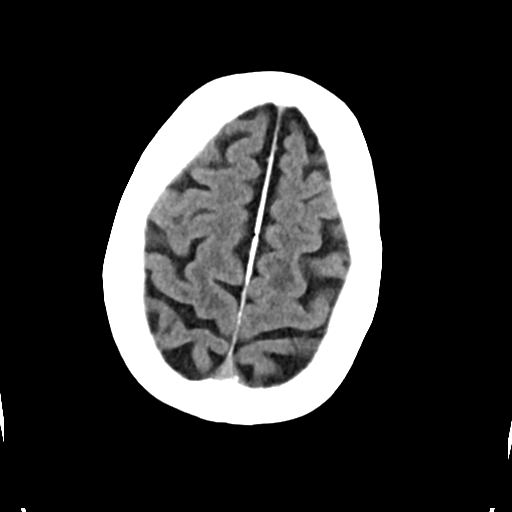
[im 29/33  brain]
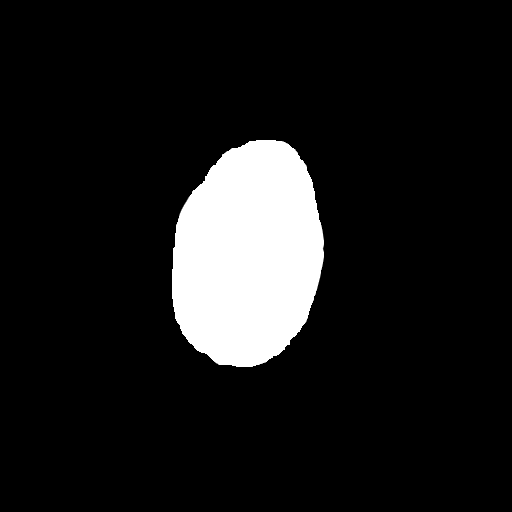

[Series 4: head bone · axial · 0.42mm/px · z∈[-44,+68]mm · 7 of 81 slices shown]
[im 9/81  bone]
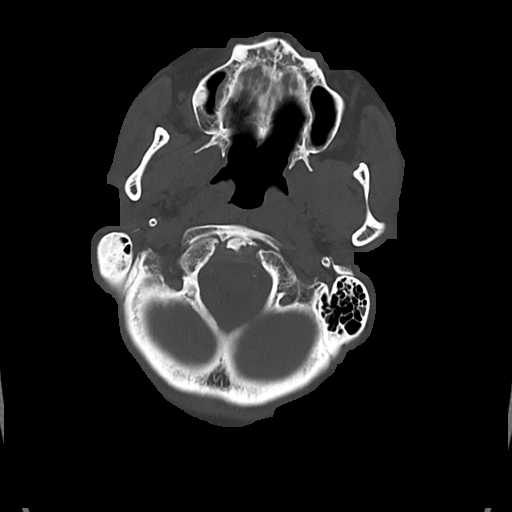
[im 17/81  bone]
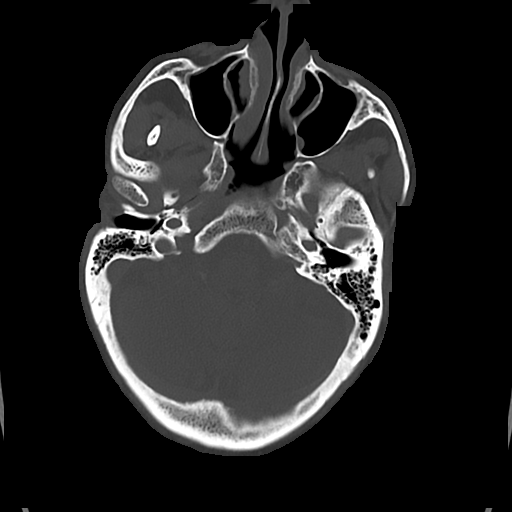
[im 25/81  bone]
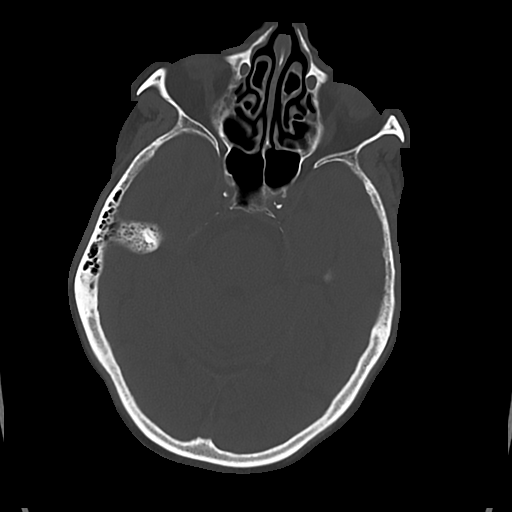
[im 37/81  bone]
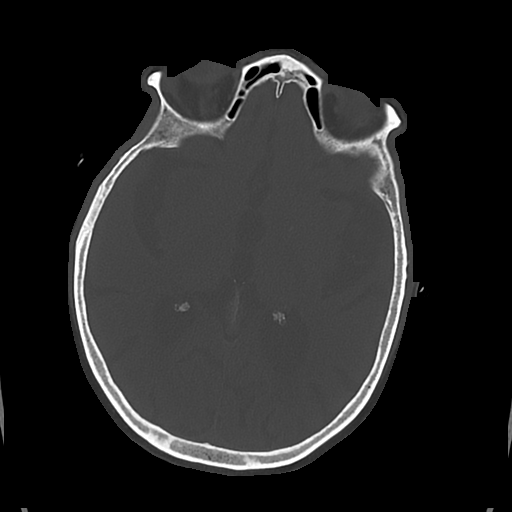
[im 45/81  bone]
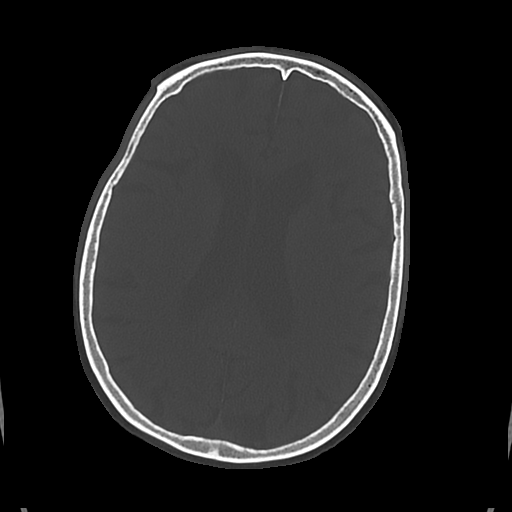
[im 57/81  bone]
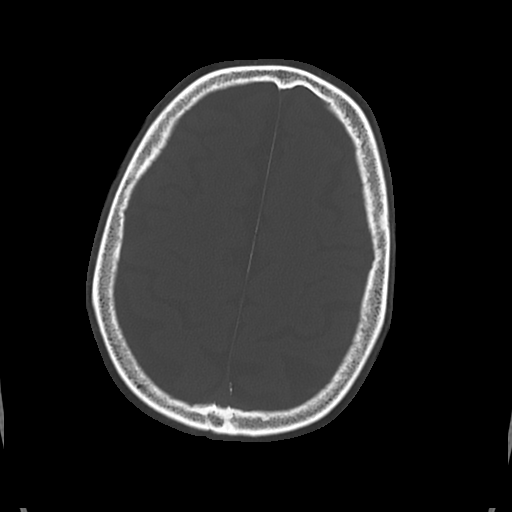
[im 65/81  bone]
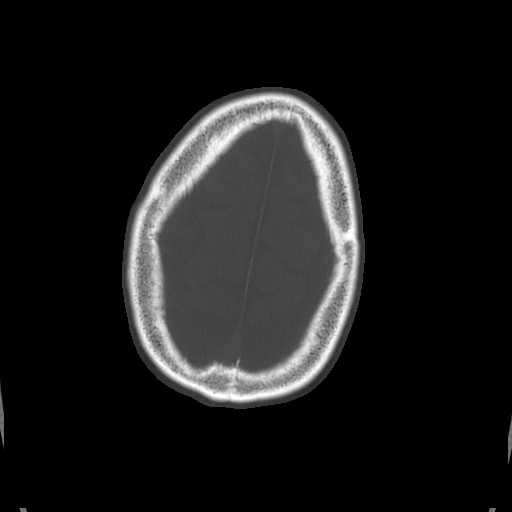

[Series 6: sag soft · sagittal · 0.33mm/px · 3 of 59 slices shown]
[im 20/59  brain]
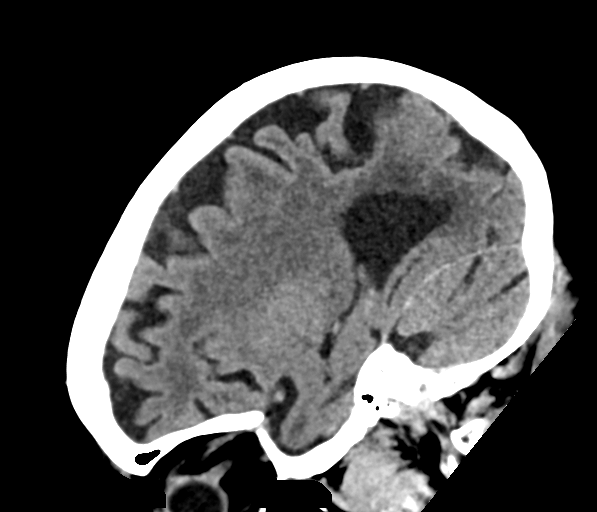
[im 30/59  brain]
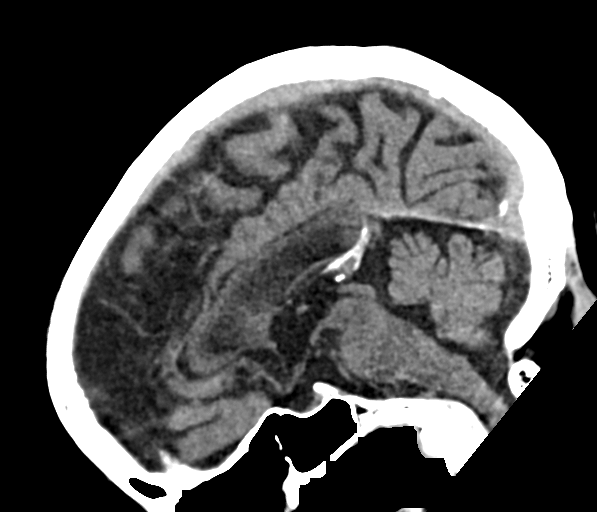
[im 39/59  brain]
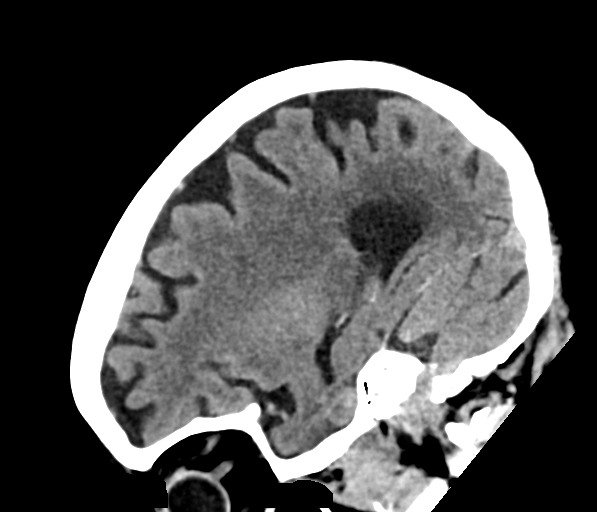

[17 of 37 positions shown; findings below may reference images not displayed]

FINDINGS: Brain: The known dorsal pontine infarct is difficult to identify on
this study.

No evidence of new infarction, hemorrhage, hydrocephalus,
extra-axial collection or mass lesion/mass effect.

Atrophy and chronic small-vessel white matter ischemic changes are
again noted. Possible tiny remote LEFT caudate head infarct again
noted.

Vascular: Carotid and vertebral atherosclerotic calcifications are
noted.

Skull: Normal. Negative for fracture or focal lesion.

Sinuses/Orbits: No acute abnormality.

Other: None
IMPRESSION: 1. No evidence of new acute intracranial abnormality. The known
dorsal pontine infarct is difficult to identify on this study.
2. Atrophy and chronic small-vessel white matter ischemic changes.

## 2021-06-10 MED ORDER — APIXABAN 2.5 MG PO TABS
2.5000 mg | ORAL_TABLET | Freq: Two times a day (BID) | ORAL | Status: DC
  Administered 2021-06-10 – 2021-06-12 (×4): 2.5 mg via ORAL
  Filled 2021-06-10 (×4): qty 1

## 2021-06-10 MED ORDER — ASPIRIN EC 325 MG PO TBEC: 325.0000 mg | DELAYED_RELEASE_TABLET | Freq: Every day | ORAL | Status: DC

## 2021-06-10 NOTE — Progress Notes (Signed)
[facility] PROGRESS NOTE   Mia [name] VZD:[id number] DOB: 09-09-1925 DOA: 06/07/2021  2 DOS: the patient was seen and examined on 06/10/2021  PCP: Ngetich, Nelda Bucks, NP  Brief History and Hospital Course:  88 y.o. female with medical history significant of CKD, T2DM, HTN, gout, hx of TIA, hx of VF who presented to ED with several week history of weakness and vision changes that started on the morning of admission.  Also had a headache.  MRI brain showed an acute infarct.  Patient was hospitalized for further management.  Patient also has a history of aphasia which is at baseline.  Patient was hospitalized and seen by neurology.  She was also found to have atrial fibrillation which is a new diagnosis for her.  Consultants: Neurology  Procedures: Transthoracic echocardiogram.  Carotid Doppler.    Subjective: Patient denies any new complaints.  Has not had any time to think about anticoagulation.  She wanted me to talk to her son as well.  Visual disturbances are about the same.     Assessment/Plan:  * CVA (cerebral vascular accident) ([place])- (present on admission) Patient with significant visual disturbances. MRI showed acute infarct.  Neurology is following.  Appears to have newly diagnosed paroxysmal atrial fibrillation. Carotid Doppler did not reveal any significant stenosis.  MRA head was ordered by neurology. Patient was started on aspirin and Plavix by neurology to be taken for 3 weeks followed by aspirin alone. Due to new onset atrial fibrillation anticoagulation was also discussed with patient and she is still thinking about it.  Risks and benefits were explained to her.  She wants.  Discussed with her son. LDL 67.  HbA1c is 6.3. Seen by PT OT and SLP.  Skilled nursing facility is recommended for short-term rehab. Echocardiogram reveals normal systolic function.  Left atrial dilatation noted. On dysphagia 3 diet with thin liquids.  AF (paroxysmal atrial fibrillation)  ([place])- (present on admission) Appears to be new diagnosis for her. Family was unaware of this.  Started on low-dose beta-blocker. CHA2DS2-VASc score of 6.   Patient remains unsure about anticoagulation.  Months.  Discussed with her son.  She still wants to think about it.   Echocardiogram shows normal systolic function.  Left atrial dilatation noted. TSH 1.25.  Essential hypertension, benign- (present on admission) Hypokalemia  Permissive hypertension is being allowed.  Prior to admission she was on HCTZ and metoprolol.  Metoprolol was started due to atrial fibrillation.  Resume her HCTZ at discharge. Potassium has improved.  CKD (chronic kidney disease) stage 4, GFR 15-29 ml/min (HCC)- (present on admission) Seems to be close to baseline.  Monitor urine output.  Avoid nephrotoxic agents.  Type 2 diabetes mellitus without complication (HCC) IRJ1O 6.3.  Monitor CBGs.  SSI.  Diet controlled at home.  Swelling of lower leg- (present on admission) Has a known history of pedal edema.  Was given 1 dose of Lasix.  Swelling has improved.  Echocardiogram shows normal systolic function  Aphasia- (present on admission) Has been going on x 2-3 years.  Mild cognitive impairment with memory loss- (present on admission) Seems to be at baseline.  Hypokalemia Potassium level noted to be 4.4 today.  Magnesium was 1.8 yesterday      DVT Prophylaxis: Lovenox Code Status: DNR Family Communication: Discussed with patient Disposition Plan: SNF  Status is: Inpatient  Remains inpatient appropriate because: Acute stroke, need for rehab        Medications: Scheduled:  [START ON 06/11/2021] aspirin EC  325 mg Oral Daily   clopidogrel  75 mg Oral q AM   enoxaparin (LOVENOX) injection  30 mg Subcutaneous Q24H   insulin aspart  0-9 Units Subcutaneous TID WC   ketorolac  1 drop Right Eye TID   LORazepam  0.5 mg Intravenous Once   metoprolol tartrate  25 mg Oral BID    Continuous: CXK:[id number] **OR** acetaminophen (TYLENOL) oral liquid 160 mg/5 mL **OR** acetaminophen, lip balm, metoprolol tartrate, senna-docusate  Antibiotics: Anti-infectives (From admission, onward)    None       Objective:  Vital Signs  Vitals:   06/09/21 2006 06/09/21 2309 06/10/21 0421 06/10/21 0748  BP: (!) 147/81 (!) 141/78 (!) 161/100 (!) 158/91  Pulse: 80 68 83 (!) 48  Resp: 16 14 15 18   Temp: (!) 97.4 F (36.3 C) 98.1 F (36.7 C) 98.2 F (36.8 C) (!) 97.5 F (36.4 C)  TempSrc: Oral Oral Oral Oral  SpO2: 100% 100% 97% 99%  Weight:      Height:        Intake/Output Summary (Last 24 hours) at 06/10/2021 1031 Last data filed at 06/10/2021 0900 Gross per 24 hour  Intake 730 ml  Output 600 ml  Net 130 ml    Filed Weights   06/07/21 2113 06/08/21 0500 06/09/21 0630  Weight: 60.2 kg 55.9 kg 54 kg    General appearance: Awake alert.  In no distress Resp: Clear to auscultation bilaterally.  Normal effort Cardio: S1-S2 is normal regular.  No S3-S4.  No rubs murmurs or bruit GI: Abdomen is soft.  Nontender nondistended.  Bowel sounds are present normal.  No masses organomegaly Extremities: No edema.  Full range of motion of lower extremities. Neurologic: Alert and oriented x3.  No focal neurological deficits.     Lab Results:  Data Reviewed: I have personally reviewed labs and imaging study reports  CBC: Recent Labs  Lab 06/07/21 1245 06/08/21 0256 06/09/21 0146  WBC 5.4 6.2 6.3  NEUTROABS 4.6  --   --   HGB 13.0 13.9 14.0  HCT 40.0 41.2 42.3  MCV 93.2 90.9 90.8  PLT 217 209 195     Basic Metabolic Panel: Recent Labs  Lab 06/07/21 1245 06/08/21 0256 06/09/21 0146 06/10/21 0217  NA 145 145 143 144  K 3.9 3.2* 3.3* 4.4  CL 109 104 107 110  CO2 25 25 27 25   GLUCOSE 131* 97 97 103*  BUN 26* 23 22 24*  CREATININE 1.69* 1.58* 1.60* 1.56*  CALCIUM 9.7 10.2 9.7 9.4  MG  --   --  1.8  --      GFR: Estimated Creatinine  Clearance: 19.2 mL/min (A) (by C-G formula based on SCr of 1.56 mg/dL (H)).   Coagulation Profile: Recent Labs  Lab 06/07/21 1245  INR 1.0      CBG: Recent Labs  Lab 06/09/21 0653 06/09/21 1304 06/09/21 1608 06/09/21 2046 06/10/21 0710  GLUCAP 95 96 123* 101* 95     Lipid Profile: Recent Labs    06/08/21 0256  CHOL 150  HDL 76  LDLCALC 67  TRIG 36  CHOLHDL 2.0      Recent Results (from the past 240 hour(s))  Resp Panel by RT-PCR (Flu A&B, Covid) Nasopharyngeal Swab     Status: None   Collection Time: 06/07/21 12:45 PM   Specimen: Nasopharyngeal Swab; Nasopharyngeal(NP) swabs in vial transport medium  Result Value Ref Range Status   SARS Coronavirus 2 by RT PCR NEGATIVE NEGATIVE Final  Comment: (NOTE) SARS-CoV-2 target nucleic acids are NOT DETECTED.  The SARS-CoV-2 RNA is generally detectable in upper respiratory specimens during the acute phase of infection. The lowest concentration of SARS-CoV-2 viral copies this assay can detect is 138 copies/mL. A negative result does not preclude SARS-Cov-2 infection and should not be used as the sole basis for treatment or other patient management decisions. A negative result may occur with  improper specimen collection/handling, submission of specimen other than nasopharyngeal swab, presence of viral mutation(s) within the areas targeted by this assay, and inadequate number of viral copies(<138 copies/mL). A negative result must be combined with clinical observations, patient history, and epidemiological information. The expected result is Negative.  Fact Sheet for Patients:  EntrepreneurPulse.com.au  Fact Sheet for Healthcare Providers:  IncredibleEmployment.be  This test is no t yet approved or cleared by the [place] FDA and  has been authorized for detection and/or diagnosis of SARS-CoV-2 by FDA under an Emergency Use Authorization (EUA). This EUA will remain  in  effect (meaning this test can be used) for the duration of the COVID-19 declaration under Section 564(b)(1) of the Act, 21 U.S.C.section 360bbb-3(b)(1), unless the authorization is terminated  or revoked sooner.       Influenza A by PCR NEGATIVE NEGATIVE Final   Influenza B by PCR NEGATIVE NEGATIVE Final    Comment: (NOTE) The Xpert Xpress SARS-CoV-2/FLU/RSV plus assay is intended as an aid in the diagnosis of influenza from Nasopharyngeal swab specimens and should not be used as a sole basis for treatment. Nasal washings and aspirates are unacceptable for Xpert Xpress SARS-CoV-2/FLU/RSV testing.  Fact Sheet for Patients: EntrepreneurPulse.com.au  Fact Sheet for Healthcare Providers: IncredibleEmployment.be  This test is not yet approved or cleared by the [place] FDA and has been authorized for detection and/or diagnosis of SARS-CoV-2 by FDA under an Emergency Use Authorization (EUA). This EUA will remain in effect (meaning this test can be used) for the duration of the COVID-19 declaration under Section 564(b)(1) of the Act, 21 U.S.C. section 360bbb-3(b)(1), unless the authorization is terminated or revoked.  Performed at [facility], [place] [address] [place], [place] [address]   Urine Culture     Status: None   Collection Time: 06/07/21  1:56 PM   Specimen: Urine, Clean Catch  Result Value Ref Range Status   Specimen Description URINE, CLEAN CATCH  Final   Special Requests NONE  Final   Culture   Final    NO GROWTH Performed at [facility], [place] [address] [place], [place] [address]    Report Status 06/08/2021 FINAL  Final       Radiology Studies: MR ANGIO HEAD WO CONTRAST  Result Date: 06/10/2021 CLINICAL DATA:  88 year old female with weakness and vision changes. Recent lacunar infarct in the dorsal brainstem on MRI. EXAM: MRA HEAD WITHOUT CONTRAST TECHNIQUE: Angiographic images of the Circle of Willis were  acquired using MRA technique without intravenous contrast. COMPARISON:  MRI 06/07/2021. FINDINGS: Anterior circulation: Antegrade flow in both ICA siphons. Bilateral siphon irregularity and evidence of calcified plaque. Mild to moderate left anterior genu stenosis. Mild right anterior genu and supraclinoid stenosis. Normal posterior communicating artery origins. Patent carotid termini. Patent MCA and ACA origins. Diminutive or absent anterior communicating artery. Visible ACA branches are within normal limits. MCA M1 segments and bifurcations are patent without stenosis. Visible MCA branches are within normal limits. Posterior circulation: Diminutive vertebrobasilar system on the basis of fetal type bilateral PCA origins. Distal left  vertebral artery antegrade flow appears normal. The distal right vertebral artery is diminutive with decreased flow. However, both PICA origins seem to remain patent. Patent vertebrobasilar junction. Highly diminutive basilar artery with mild to moderate mid basilar irregularity and stenosis (series 1037, image 11). Patent SCA and PCA origins. Bilateral PCA branches are within normal limits. Anatomic variants: Bilateral fetal PCA origins. Other: No intracranial mass effect or ventriculomegaly. IMPRESSION: 1. Negative for large vessel occlusion. 2. Positive for intracranial atherosclerosis, superimposed on diminutive vertebrobasilar system on the basis of fetal type bilateral PCA origins. Mild to moderate mid Basilar Artery stenosis. Attenuated flow in the distal Right Vertebral Artery raising the possibility of upstream stenosis. Mild to moderate bilateral ICA Siphon stenosis greater on the left. Electronically Signed   By: Genevie Ann M.D.   On: 06/10/2021 05:38   DG Swallowing Func-Speech Pathology  Result Date: 06/08/2021 Table formatting from the original result was not included. Objective Swallowing Evaluation: Type of Study: MBS-Modified Barium Swallow Study  Patient Details Name:  Mia [name] MRN: [id number] Date of Birth: November 17, 1925 Today's Date: 06/08/2021 Time: SLP Start Time (ACUTE ONLY): 17 -SLP Stop Time (ACUTE ONLY): 1400 SLP Time Calculation (min) (ACUTE ONLY): 19 min Past Medical History: Past Medical History: Diagnosis Date  Arthritis   oa  Chronic kidney disease   chronic kidney diseaase follow by primary md  Diabetes mellitus without complication ([place])   Diet controlled  Former smoker   H/O ventricular fibrillation 10 yrs ago dx  Hypertension   Prediabetes   Seasonal allergies   TIA (transient ischemic attack) none recent Past Surgical History: Past Surgical History: Procedure Laterality Date  NM MYOCAR PERF WALL MOTION  01/2008  lexiscan myoview - normal pattern of perfusion in all regions, EF 92%, no significant ischemia demonstated  TONSILLECTOMY  age 63  and adenoids  TOTAL KNEE ARTHROPLASTY Right 10/06/2015  Procedure: RIGHT TOTAL KNEE ARTHROPLASTY;  Surgeon: [name], MD;  Location: [facility];  Service: Orthopedics;  Laterality: Right;  TRANSTHORACIC ECHOCARDIOGRAM  12/2005  mild DUST, EF=>55%; mild mitral annular calcif; mild-mod aortic root calcif HPI: [name] who presented to the ED with vision changes and several week history of weakness. MRI brain 1/19:  5 mm acute infarct within the midline dorsal pons. Pt failed the [place] due to coughing. PMH: CKD, T2DM, HTN, gout, hx of TIA, VF.  No data recorded  Recommendations for follow up therapy are one component of a multi-disciplinary discharge planning process, led by the attending physician.  Recommendations may be updated based on patient status, additional functional criteria and insurance authorization. Assessment / Plan / Recommendation Clinical Impressions 06/08/2021 Clinical Impression Pt presents with mild pharyngeal dysphagia characterized by reduced tongue base retraction, and a pharyngeal delay. She demonstrated vallecular residue, and penetration (PAS 2, 3) with thin liquids via straw. A  single instance of aspiration (PAS 7) was noted with when pt consumed the 48mm barium tablet with thin liquids via straw. Aspirate originated from pyriform sinus residue when pt was leaning forward. However, no other instances of aspiration were noted. Amount of vallecular residue was reduced with a liquid wash. A chin tuck posture was effective in eliminating laryngeal invasion and reducing pharyngeal residue. Pt was elated by her perceived improvement in bolus movement with use of these strategies and she verbalized agreement with using them during meals. A dysphagia 3 diet with thin liquids is recommended with observance of swallowing precautions. SLP will continue to follow pt. SLP Visit  Diagnosis Dysphagia, pharyngeal phase (R13.13) Attention and concentration deficit following -- Frontal lobe and executive function deficit following -- Impact on safety and function Mild aspiration risk   Treatment Recommendations 06/08/2021 Treatment Recommendations Therapy as outlined in treatment plan below   Prognosis 06/08/2021 Prognosis for Safe Diet Advancement Good Barriers to Reach Goals Cognitive deficits Barriers/Prognosis Comment -- Diet Recommendations 06/08/2021 SLP Diet Recommendations Dysphagia 3 (Mech soft) solids;Thin liquid Liquid Administration via Cup;Straw Medication Administration Whole meds with puree Compensations Slow rate;Small sips/bites;Chin tuck;Follow solids with liquid Postural Changes Seated upright at 90 degrees   Other Recommendations 06/08/2021 Recommended Consults -- Oral Care Recommendations Oral care BID Other Recommendations -- Follow Up Recommendations (No Data) Assistance recommended at discharge -- Functional Status Assessment Patient has had a recent decline in their functional status and demonstrates the ability to make significant improvements in function in a reasonable and predictable amount of time. Frequency and Duration  06/08/2021 Speech Therapy Frequency (ACUTE ONLY) min 2x/week  Treatment Duration 2 weeks   Oral Phase 06/08/2021 Oral Phase WFL Oral - Pudding Teaspoon -- Oral - Pudding Cup -- Oral - Honey Teaspoon -- Oral - Honey Cup -- Oral - Nectar Teaspoon -- Oral - Nectar Cup -- Oral - Nectar Straw -- Oral - Thin Teaspoon -- Oral - Thin Cup -- Oral - Thin Straw -- Oral - Puree -- Oral - Mech Soft -- Oral - Regular -- Oral - Multi-Consistency -- Oral - Pill -- Oral Phase - Comment --  Pharyngeal Phase 06/08/2021 Pharyngeal Phase Impaired Pharyngeal- Pudding Teaspoon -- Pharyngeal -- Pharyngeal- Pudding Cup -- Pharyngeal -- Pharyngeal- Honey Teaspoon -- Pharyngeal -- Pharyngeal- Honey Cup -- Pharyngeal -- Pharyngeal- Nectar Teaspoon -- Pharyngeal -- Pharyngeal- Nectar Cup -- Pharyngeal -- Pharyngeal- Nectar Straw Delayed swallow initiation-pyriform sinuses;Delayed swallow initiation-vallecula;Pharyngeal residue - valleculae;Reduced tongue base retraction Pharyngeal -- Pharyngeal- Thin Teaspoon -- Pharyngeal -- Pharyngeal- Thin Cup Delayed swallow initiation-pyriform sinuses;Delayed swallow initiation-vallecula;Pharyngeal residue - valleculae;Reduced tongue base retraction Pharyngeal -- Pharyngeal- Thin Straw Delayed swallow initiation-pyriform sinuses;Delayed swallow initiation-vallecula;Pharyngeal residue - valleculae;Reduced tongue base retraction;Penetration/Aspiration during swallow;Penetration/Apiration after swallow Pharyngeal Material enters airway, remains ABOVE vocal cords then ejected out;Material enters airway, remains ABOVE vocal cords and not ejected out Pharyngeal- Puree Delayed swallow initiation-pyriform sinuses;Delayed swallow initiation-vallecula;Pharyngeal residue - valleculae;Reduced tongue base retraction Pharyngeal -- Pharyngeal- Mechanical Soft Delayed swallow initiation-pyriform sinuses;Delayed swallow initiation-vallecula;Pharyngeal residue - valleculae;Reduced tongue base retraction Pharyngeal -- Pharyngeal- Regular Delayed swallow initiation-pyriform  sinuses;Delayed swallow initiation-vallecula;Pharyngeal residue - valleculae;Reduced tongue base retraction Pharyngeal -- Pharyngeal- Multi-consistency -- Pharyngeal -- Pharyngeal- Pill Delayed swallow initiation-pyriform sinuses;Delayed swallow initiation-vallecula;Pharyngeal residue - valleculae;Reduced tongue base retraction;Penetration/Apiration after swallow Pharyngeal Material enters airway, passes BELOW cords and not ejected out despite cough attempt by patient Pharyngeal Comment --  Cervical Esophageal Phase  06/08/2021 Cervical Esophageal Phase WFL Pudding Teaspoon -- Pudding Cup -- Honey Teaspoon -- Honey Cup -- Nectar Teaspoon -- Nectar Cup -- Nectar Straw -- Thin Teaspoon -- Thin Cup -- Thin Straw -- Puree -- Mechanical Soft -- Regular -- Multi-consistency -- Pill -- Cervical Esophageal Comment -- Shanika I. Hardin Negus, [place], [place] Office number [phone] Pager [place] 06/08/2021, 2:53 PM                     ECHOCARDIOGRAM COMPLETE  Result Date: 06/08/2021    ECHOCARDIOGRAM REPORT   Patient Name:   Mia [name] Date of Exam: 06/08/2021 Medical Rec #:  [id number]    Height:  67.0 in Accession #:    [id number]   Weight:       123.2 lb Date of Birth:  09-18-1925     BSA:          1.646 m Patient Age:    83 years     BP:           171/102 mmHg Patient Gender: F            HR:           79 bpm. Exam Location:  Inpatient Procedure: 2D Echo Indications:    Stroke  History:        Patient has prior history of Echocardiogram examinations, most                 recent 12/20/2005. Risk Factors:Hypertension and Diabetes.  Sonographer:    Jefferey Pica Referring Phys: [id number] [place]  1. Left ventricular ejection fraction, by estimation, is 60 to 65%. The left ventricle has normal function. The left ventricle has no regional wall motion abnormalities. There is mild concentric left ventricular hypertrophy of the basal segme nt No LVOT   obstruction  2. Left atrial size was severely dilated.  3. Right atrial size was moderately dilated.  4. Mild mitral valve regurgitation.  5. The aortic valve is grossly normal. Aortic valve regurgitation is trivial.  6. Right ventricular systolic function is normal. The right ventricular size is normal.  7. The inferior vena cava is normal in size with greater than 50% respiratory variability, suggesting right atrial pressure of 3 mmHg. Comparison(s): No prior Echocardiogram. FINDINGS  Left Ventricle: Left ventricular ejection fraction, by estimation, is 60 to 65%. The left ventricle has normal function. The left ventricle has no regional wall motion abnormalities. The left ventricular internal cavity size was normal in size. There is  mild concentric left ventricular hypertrophy of the basal segment. Left ventricular diastolic parameters are consistent with Grade I diastolic dysfunction (impaired relaxation). Right Ventricle: The right ventricular size is normal. No increase in right ventricular wall thickness. Right ventricular systolic function is normal. The tricuspid regurgitant velocity is 2.28 m/s, and with an assumed right atrial pressure of 5 mmHg, the estimated right ventricular systolic pressure is 99.8 mmHg. Left Atrium: Left atrial size was severely dilated. Right Atrium: Right atrial size was moderately dilated. Pericardium: There is no evidence of pericardial effusion. Mitral Valve: There is moderate calcification of the mitral valve leaflet(s). Mild mitral valve regurgitation. Tricuspid Valve: The tricuspid valve is grossly normal. Tricuspid valve regurgitation is trivial. Aortic Valve: The aortic valve is grossly normal. Aortic valve regurgitation is trivial. Aortic regurgitation PHT measures 454 msec. Aortic valve peak gradient measures 5.1 mmHg. Pulmonic Valve: The pulmonic valve was not well visualized. Pulmonic valve regurgitation is not visualized. Aorta: The aortic root and ascending aorta  are structurally normal, with no evidence of dilitation. Venous: The inferior vena cava is normal in size with greater than 50% respiratory variability, suggesting right atrial pressure of 3 mmHg. IAS/Shunts: No atrial level shunt detected by color flow Doppler.  LEFT VENTRICLE PLAX 2D LVIDd:         4.00 cm   Diastology LVIDs:         2.50 cm   LV e' lateral:   3.65 cm/s LV PW:         1.00 cm   LV E/e' lateral: 23.6 LV IVS:        1.00 cm LVOT diam:  1.90 cm LV SV:         47 LV SV Index:   28 LVOT Area:     2.84 cm  IVC IVC diam: 1.30 cm LEFT ATRIUM             Index        RIGHT ATRIUM           Index LA diam:        3.40 cm 2.07 cm/m   RA Area:     13.40 cm LA Vol (A2C):   93.7 ml 56.92 ml/m  RA Volume:   34.30 ml  20.84 ml/m LA Vol (A4C):   47.3 ml 28.73 ml/m LA Biplane Vol: 73.9 ml 44.89 ml/m  AORTIC VALVE                 PULMONIC VALVE AV Area (Vmax): 2.30 cm     PV Vmax:       0.78 m/s AV Vmax:        113.40 cm/s  PV Peak grad:  2.4 mmHg AV Peak Grad:   5.1 mmHg LVOT Vmax:      91.95 cm/s LVOT Vmean:     54.600 cm/s LVOT VTI:       0.165 m AI PHT:         454 msec  AORTA Ao Root diam: 3.10 cm Ao Asc diam:  3.00 cm MITRAL VALVE                TRICUSPID VALVE MV Area (PHT): 1.93 cm     TR Peak grad:   20.8 mmHg MV Decel Time: 393 msec     TR Vmax:        228.00 cm/s MR Peak grad: 174.8 mmHg MR Vmax:      661.00 cm/s   SHUNTS MV E velocity: 86.30 cm/s   Systemic VTI:  0.17 m MV A velocity: 133.00 cm/s  Systemic Diam: 1.90 cm MV E/A ratio:  0.65 Mary Scientist, physiological signed by Phineas Inches Signature Date/Time: 06/08/2021/11:24:26 AM    Final        LOS: 2 days   Bonnielee Haff  [facility] Pager on [email or web address]  06/10/2021, 10:31 AM

## 2021-06-10 NOTE — Progress Notes (Addendum)
STROKE TEAM PROGRESS NOTE   ATTENDING NOTE: I reviewed above note and agree with the assessment and plan. Pt was seen and examined.   Son at bedside.  Patient lying in bed, still has one and half syndrome, neurologically unchanged.  Initially concerning for visual field deficit but I will recheck patient has no visual field deficit. Repeat CT unchanged.  MRA showed mild to moderate mid basilar artery stenosis.  Dr. [place] Pink told me that patient had new onset paroxysmal A. fib since this admission.  Patient stroke could be due to bilaterally atherosclerosis or new onset A. fib.  Agree with initiating Eliquis and discontinuing aspirin and Plavix.  Discussed with patient son and patient at bedside, they agree with starting Eliquis 2.5 twice daily.  PT/OT recommend SNF.  For detailed assessment and plan, please refer to above as I have made changes wherever appropriate.   Neurology will sign off. Please call with questions. Pt will follow up with stroke clinic NP at [facility] in about 4 weeks. Thanks for the consult.   Rosalin Hawking, MD PhD Stroke Neurology 06/10/2021 4:43 PM  ADDENDUM: Pt NIHSS on admission was 5, then gradually decreased to 4, then 3 and 2 on discharge over time.   Rosalin Hawking, MD PhD Stroke Neurology 06/13/2021 6:51 PM    INTERVAL HISTORY Patient is seen in her room with no family at the bedside.  She has been hemodynamically stable but has a new right visual field cut- STAT head CT ordered and reveals no acute changes.  She reports the her vision is sightly better today.  Will start Eliquis for atrial fibrillation tonight.  Vitals:   06/09/21 2309 06/10/21 0421 06/10/21 0748 06/10/21 1219  BP: (!) 141/78 (!) 161/100 (!) 158/91 (!) 158/69  Pulse: 68 83 (!) 48 (!) 107  Resp: 14 15 18  (!) 22  Temp: 98.1 F (36.7 C) 98.2 F (36.8 C) (!) 97.5 F (36.4 C) (!) 97.5 F (36.4 C)  TempSrc: Oral Oral Oral Oral  SpO2: 100% 97% 99% 100%  Weight:      Height:       CBC:  Recent  Labs  Lab 06/07/21 1245 06/08/21 0256 06/09/21 0146  WBC 5.4 6.2 6.3  NEUTROABS 4.6  --   --   HGB 13.0 13.9 14.0  HCT 40.0 41.2 42.3  MCV 93.2 90.9 90.8  PLT 217 209 144    Basic Metabolic Panel:  Recent Labs  Lab 06/09/21 0146 06/10/21 0217  NA 143 144  K 3.3* 4.4  CL 107 110  CO2 27 25  GLUCOSE 97 103*  BUN 22 24*  CREATININE 1.60* 1.56*  CALCIUM 9.7 9.4  MG 1.8  --     Lipid Panel:  Recent Labs  Lab 06/08/21 0256  CHOL 150  TRIG 36  HDL 76  CHOLHDL 2.0  VLDL 7  LDLCALC 67    HgbA1c:  Recent Labs  Lab 06/08/21 0256  HGBA1C 6.3*    Urine Drug Screen: No results for input(s): LABOPIA, COCAINSCRNUR, LABBENZ, AMPHETMU, THCU, LABBARB in the last 168 hours.  Alcohol Level No results for input(s): ETH in the last 168 hours.  IMAGING past 24 hours MR ANGIO HEAD WO CONTRAST  Result Date: 06/10/2021 CLINICAL DATA:  88 year old female with weakness and vision changes. Recent lacunar infarct in the dorsal brainstem on MRI. EXAM: MRA HEAD WITHOUT CONTRAST TECHNIQUE: Angiographic images of the Circle of Willis were acquired using MRA technique without intravenous contrast. COMPARISON:  MRI 06/07/2021. FINDINGS: Anterior  circulation: Antegrade flow in both ICA siphons. Bilateral siphon irregularity and evidence of calcified plaque. Mild to moderate left anterior genu stenosis. Mild right anterior genu and supraclinoid stenosis. Normal posterior communicating artery origins. Patent carotid termini. Patent MCA and ACA origins. Diminutive or absent anterior communicating artery. Visible ACA branches are within normal limits. MCA M1 segments and bifurcations are patent without stenosis. Visible MCA branches are within normal limits. Posterior circulation: Diminutive vertebrobasilar system on the basis of fetal type bilateral PCA origins. Distal left vertebral artery antegrade flow appears normal. The distal right vertebral artery is diminutive with decreased flow. However, both  PICA origins seem to remain patent. Patent vertebrobasilar junction. Highly diminutive basilar artery with mild to moderate mid basilar irregularity and stenosis (series 1037, image 11). Patent SCA and PCA origins. Bilateral PCA branches are within normal limits. Anatomic variants: Bilateral fetal PCA origins. Other: No intracranial mass effect or ventriculomegaly. IMPRESSION: 1. Negative for large vessel occlusion. 2. Positive for intracranial atherosclerosis, superimposed on diminutive vertebrobasilar system on the basis of fetal type bilateral PCA origins. Mild to moderate mid Basilar Artery stenosis. Attenuated flow in the distal Right Vertebral Artery raising the possibility of upstream stenosis. Mild to moderate bilateral ICA Siphon stenosis greater on the left. Electronically Signed   By: Genevie Ann M.D.   On: 06/10/2021 05:38    PHYSICAL EXAM General:  Alert, thin appearing female patient in no acute distress   NEURO:  Mental Status: AA&Ox3  Speech/Language: speech is without dysarthria or aphasia.    Cranial Nerves:  II: PERRL. Right visual field cut  III, IV, VI: EOMI shows a 1-1/2 syndrome.  Complete left gaze horizontal palsy.  Left eye adducts very slightly and right eye is able to AB duct but shows nystagmus.  No vertical gaze limitation.  Patient has binocular diplopia in the horizontal plane. V: Sensation is intact to light touch and symmetrical to face.  VII: Smile is symmetrical.  VIII: hearing intact to voice. IX, X: Palate elevates symmetrically. Phonation is normal.  XII: tongue is midline without fasciculations. Motor: 5/5 strength to all muscle groups tested.  Sensation- Intact to light touch bilaterally.  Coordination: FTN intact bilaterally.  No drift.  Gait- deferred   ASSESSMENT/PLAN Mia [name] is a 88 y.o. female with history of CKD, T2DM, HTN, gout, TIA and VF presenting with diplopia upon awakening yesterday.  She then and presented to the ED.  MRI revealed a  small pontine stroke.  Exam findings reveal one and a half syndrome with no horizontal movement of the right eye.  Glasses provided by OT are helpful in managing diplopia.  Head CT ordered to evaluate new right visual field cut shows no acute abnormalities.  Will start Eliquis for atrial fibrillation.  Stroke:   Paramedian pontine infarct  likely secondary to small vessel disease CT head No acute abnormality.  MRI  66mm acute infarct in midline dorsal pons MRA  intracranial atherosclerosis with diminutive vertebrobasilar system with bilateral fetal PCA origins Carotid Doppler  1-39% stenosis in right carotid, near normal left carotid, retrograde flow in right vertebral artery 2D Echo EF 60-65%, mild LVH,  severely dilated left atrium, no atrial level shunt LDL 67 HgbA1c 6.1 VTE prophylaxis - lovenox    Diet   DIET DYS 3 Room service appropriate? Yes with Assist; Fluid consistency: Thin   clopidogrel 75 mg daily prior to admission, now on aspirin 81 mg daily and clopidogrel 75 mg daily.x 3 weeks and then aspirin alone  Therapy recommendations:  rehab at SNF Disposition:  pending  Hypertension Home meds:  HCTZ 12.5 mg every other day Stable Permissive hypertension (OK if < 220/120) but gradually normalize in 5-7 days Long-term BP goal normotensive  Hyperlipidemia Home meds:  none LDL 67, goal < 70 High intensity statin not indicated as LDL below goal Continue statin at discharge  Diabetes type II Controlled Home meds:  none HgbA1c 6.1, goal < 7.0 CBGs SSI  Other Stroke Risk Factors Advanced Age >/= 16  Former cigarette smoker Hx TIA  Other Active Problems none  Hospital day # [place] , MSN, AGACNP-BC [facility] See Amion for schedule and pager information 06/10/2021 1:03 PM   To contact Stroke Continuity provider, please refer to [email or web address] After hours, contact General Neurology

## 2021-06-10 NOTE — Discharge Instructions (Signed)
Information on my medicine - ELIQUIS® (apixaban) ° °Why was Eliquis® prescribed for you? °Eliquis® was prescribed for you to reduce the risk of forming blood clots that can cause a stroke if you have a medical condition called atrial fibrillation (a type of irregular heartbeat) OR to reduce the risk of a blood clots forming after orthopedic surgery. ° °What do You need to know about Eliquis® ? °Take your Eliquis® TWICE DAILY - one tablet in the morning and one tablet in the evening with or without food.  It would be best to take the doses about the same time each day. ° °If you have difficulty swallowing the tablet whole please discuss with your pharmacist how to take the medication safely. ° °Take Eliquis® exactly as prescribed by your doctor and DO NOT stop taking Eliquis® without talking to the doctor who prescribed the medication.  Stopping may increase your risk of developing a new clot or stroke.  Refill your prescription before you run out. ° °After discharge, you should have regular check-up appointments with your healthcare provider that is prescribing your Eliquis®.  In the future your dose may need to be changed if your kidney function or weight changes by a significant amount or as you get older. ° °What do you do if you miss a dose? °If you miss a dose, take it as soon as you remember on the same day and resume taking twice daily.  Do not take more than one dose of ELIQUIS at the same time. ° °Important Safety Information °A possible side effect of Eliquis® is bleeding. You should call your healthcare provider right away if you experience any of the following: °Bleeding from an injury or your nose that does not stop. °Unusual colored urine (red or dark brown) or unusual colored stools (red or black). °Unusual bruising for unknown reasons. °A serious fall or if you hit your head (even if there is no bleeding). ° °Some medicines may interact with Eliquis® and might increase your risk of bleeding or  clotting while on Eliquis®. To help avoid this, consult your healthcare provider or pharmacist prior to using any new prescription or non-prescription medications, including herbals, vitamins, non-steroidal anti-inflammatory drugs (NSAIDs) and supplements. ° °This website has more information on Eliquis® (apixaban): http://www.eliquis.com/eliquis/home °  °

## 2021-06-10 NOTE — TOC Progression Note (Signed)
Transition of Care [facility]) - Progression Note    Patient Details  Name: Mia [name] MRN: [id number] Date of Birth: 17-Oct-1925  Transition of Care [facility]) CM/SW [place], [place] Phone Number: 06/10/2021, 12:06 PM  Clinical Narrative:    CSW spoke with pt at bedside and provided her bed offer. She noted understanding and said she would ask her family about the facility they had toured to make sure we sent a referral there. TOC will continue to follow.   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

## 2021-06-11 ENCOUNTER — Ambulatory Visit: Payer: Medicare PPO | Admitting: Podiatry

## 2021-06-11 LAB — GLUCOSE, CAPILLARY
Glucose-Capillary: 94 mg/dL (ref 70–99)
Glucose-Capillary: 99 mg/dL (ref 70–99)
Glucose-Capillary: 60 mg/dL — ABNORMAL LOW (ref 70–99)
Glucose-Capillary: 219 mg/dL — ABNORMAL HIGH (ref 70–99)
Glucose-Capillary: 170 mg/dL — ABNORMAL HIGH (ref 70–99)

## 2021-06-11 MED ORDER — POLYETHYLENE GLYCOL 3350 17 G PO PACK
17.0000 g | PACK | Freq: Every day | ORAL | Status: DC
  Administered 2021-06-12: 08:00:00 17 g via ORAL
  Filled 2021-06-11 (×2): qty 1

## 2021-06-11 MED ORDER — POLYETHYLENE GLYCOL 3350 17 G PO PACK: 17.0000 g | PACK | Freq: Every day | ORAL | 0 refills | Status: AC

## 2021-06-11 MED ORDER — AMLODIPINE BESYLATE 5 MG PO TABS
5.0000 mg | ORAL_TABLET | Freq: Every day | ORAL | Status: DC
  Administered 2021-06-11 – 2021-06-12 (×2): 5 mg via ORAL
  Filled 2021-06-11 (×2): qty 1

## 2021-06-11 MED ORDER — APIXABAN 2.5 MG PO TABS: 2.5000 mg | ORAL_TABLET | Freq: Two times a day (BID) | ORAL | Status: AC

## 2021-06-11 MED ORDER — DEXTROSE 50 % IV SOLN
INTRAVENOUS | Status: AC
  Administered 2021-06-11: 50 mL
  Filled 2021-06-11: qty 50

## 2021-06-11 MED ORDER — SENNOSIDES-DOCUSATE SODIUM 8.6-50 MG PO TABS
2.0000 | ORAL_TABLET | Freq: Two times a day (BID) | ORAL | Status: DC
  Administered 2021-06-11 – 2021-06-12 (×3): 2 via ORAL
  Filled 2021-06-11 (×3): qty 2

## 2021-06-11 MED ORDER — GLUCERNA SHAKE PO LIQD
237.0000 mL | Freq: Three times a day (TID) | ORAL | Status: DC
  Administered 2021-06-11 – 2021-06-12 (×2): 237 mL via ORAL
  Filled 2021-06-11: qty 237

## 2021-06-11 MED ORDER — AMLODIPINE BESYLATE 5 MG PO TABS: 5.0000 mg | ORAL_TABLET | Freq: Every day | ORAL | Status: AC

## 2021-06-11 MED ORDER — SENNOSIDES-DOCUSATE SODIUM 8.6-50 MG PO TABS: 2.0000 | ORAL_TABLET | Freq: Two times a day (BID) | ORAL | Status: AC

## 2021-06-11 MED ORDER — GLUCERNA SHAKE PO LIQD: 237.0000 mL | Freq: Three times a day (TID) | ORAL | 0 refills | Status: AC

## 2021-06-11 MED ORDER — METOPROLOL TARTRATE 25 MG PO TABS: 25.0000 mg | ORAL_TABLET | Freq: Two times a day (BID) | ORAL | Status: AC

## 2021-06-11 NOTE — Plan of Care (Signed)
  Problem: Education: Goal: Knowledge of disease or condition will improve Outcome: Progressing Goal: Knowledge of secondary prevention will improve (SELECT ALL) Outcome: Progressing Goal: Knowledge of patient specific risk factors will improve (INDIVIDUALIZE FOR PATIENT) Outcome: Progressing   Problem: Ischemic Stroke/TIA Tissue Perfusion: Goal: Complications of ischemic stroke/TIA will be minimized Outcome: Progressing   

## 2021-06-11 NOTE — TOC Progression Note (Signed)
Transition of Care [facility]) - Progression Note    Patient Details  Name: Rosell Khouri MRN: [id number] Date of Birth: [birth year]  Transition of Care [facility]) CM/SW [place], [place] Phone Number: 06/11/2021, 3:53 PM  Clinical Narrative:   CSW received call from patient's son, Richardson Landry, about bed offers. CSW discussed other pending SNF still waiting to hear back from, and Richardson Landry will review with sister. CSW received bed offer for patient from [facility] and [place]. CSW spoke with Richardson Landry to update, and he will discuss with sister. Richardson Landry called back and would like to choose bed at [facility]. CSW to start insurance authorization.    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

## 2021-06-11 NOTE — Discharge Summary (Signed)
[facility]  Physician Discharge Summary   Patient ID: Mia [name] MRN: [id number] DOB/AGE: Mar 01, 1926 88 y.o.  Admit date: 06/07/2021 Discharge date:   06/11/2021   PCP: Ngetich, Nelda Bucks, NP  DISCHARGE DIAGNOSES:  Principal Problem:   CVA (cerebral vascular accident) ([place]) Active Problems:   AF (paroxysmal atrial fibrillation) ([place])   Essential hypertension, benign   CKD (chronic kidney disease) stage 4, GFR 15-29 ml/min (HCC)   Type 2 diabetes mellitus without complication (HCC)   Swelling of lower leg   Mild cognitive impairment with memory loss   Aphasia   Hypokalemia   Acute CVA (cerebrovascular accident) ([place])   RECOMMENDATIONS FOR OUTPATIENT FOLLOW UP: Check CBC and basic metabolic panel in 3 to 4 days Ambulatory referral sent to neurology for outpatient follow-up.   Home Health: Going to SNF Equipment/Devices: None  CODE STATUS: DNR  DISCHARGE CONDITION: fair  Diet recommendation: Dysphagia 3 diet with thin liquids  INITIAL HISTORY: 88 y.o. female with medical history significant of CKD, T2DM, HTN, gout, hx of TIA, hx of VF who presented to ED with several week history of weakness and vision changes that started on the morning of admission.  Also had a headache.  MRI brain showed an acute infarct.  Patient was hospitalized for further management.  Patient also has a history of aphasia which is at baseline.  Patient was hospitalized and seen by neurology.  She was also found to have atrial fibrillation which is a new diagnosis for her.  Started on Eliquis after discussions with patient and her family.  Consultations: Neurology  Procedures: Transthoracic echocardiogram Carotid Doppler  HOSPITAL COURSE:    * CVA (cerebral vascular accident) ([place])- (present on admission) Patient with significant visual disturbances. MRI showed acute infarct.  Neurology is following.  Appears to have newly diagnosed paroxysmal atrial fibrillation. Carotid Doppler  did not reveal any significant stenosis.  MRA head was ordered by neurology. Patient was initially started on aspirin and Plavix.  Anticoagulation issue was discussed with patient and her son.  After extensive discussions regarding risks and benefits family and patient have elected to go ahead with the low-dose Eliquis which was initiated yesterday.  Discontinue aspirin and Plavix.  This was discussed with neurology who agrees with plan. LDL 67.  HbA1c is 6.3.  No statin as LDL is below goal. Seen by PT OT and SLP.  Skilled nursing facility is recommended for short-term rehab. Echocardiogram reveals normal systolic function.  Left atrial dilatation noted. On dysphagia 3 diet with thin liquids.  AF (paroxysmal atrial fibrillation) ([place])- (present on admission) Appears to be new diagnosis for her. Family was unaware of this.  Started on low-dose beta-blocker.  Heart rate is well controlled. CHA2DS2-VASc score of 6.  After extensive discussions with patient and family patient started on Eliquis. Echocardiogram shows normal systolic function.  Left atrial dilatation noted. TSH 1.25.  Essential hypertension, benign- (present on admission) Hypokalemia  Permissive hypertension was allowed initially.  Continued on metoprolol due to atrial fibrillation.  Started on amlodipine.  Holding HCTZ due to elevated creatinine.  Potassium is improved.    CKD (chronic kidney disease) stage 4, GFR 15-29 ml/min (HCC)- (present on admission) Seems to be close to baseline.  Monitor periodically.  Type 2 diabetes mellitus without complication (HCC) IWL7L 6.3.  Diet controlled at home.  Monitor CBGs at SNF.  Swelling of lower leg- (present on admission) Has a known history of pedal edema.  Was given 1 dose of Lasix.  Swelling has  improved.  Echocardiogram shows normal systolic function  Aphasia- (present on admission) Has been going on x 2-3 years.  Mild cognitive impairment with memory loss- (present on  admission) Seems to be at baseline.  Hypokalemia Improved.  Monitor at [place].      Patient is stable.  Okay for discharge to skilled nursing facility when bed is available.   PERTINENT LABS:  The results of significant diagnostics from this hospitalization (including imaging, microbiology, ancillary and laboratory) are listed below for reference.    Microbiology: Recent Results (from the past 240 hour(s))  Resp Panel by RT-PCR (Flu A&B, Covid) Nasopharyngeal Swab     Status: None   Collection Time: 06/07/21 12:45 PM   Specimen: Nasopharyngeal Swab; Nasopharyngeal(NP) swabs in vial transport medium  Result Value Ref Range Status   SARS Coronavirus 2 by RT PCR NEGATIVE NEGATIVE Final    Comment: (NOTE) SARS-CoV-2 target nucleic acids are NOT DETECTED.  The SARS-CoV-2 RNA is generally detectable in upper respiratory specimens during the acute phase of infection. The lowest concentration of SARS-CoV-2 viral copies this assay can detect is 138 copies/mL. A negative result does not preclude SARS-Cov-2 infection and should not be used as the sole basis for treatment or other patient management decisions. A negative result may occur with  improper specimen collection/handling, submission of specimen other than nasopharyngeal swab, presence of viral mutation(s) within the areas targeted by this assay, and inadequate number of viral copies(<138 copies/mL). A negative result must be combined with clinical observations, patient history, and epidemiological information. The expected result is Negative.  Fact Sheet for Patients:  EntrepreneurPulse.com.au  Fact Sheet for Healthcare Providers:  IncredibleEmployment.be  This test is no t yet approved or cleared by the [place] FDA and  has been authorized for detection and/or diagnosis of SARS-CoV-2 by FDA under an Emergency Use Authorization (EUA). This EUA will remain  in  effect (meaning this test can be used) for the duration of the COVID-19 declaration under Section 564(b)(1) of the Act, 21 U.S.C.section 360bbb-3(b)(1), unless the authorization is terminated  or revoked sooner.       Influenza A by PCR NEGATIVE NEGATIVE Final   Influenza B by PCR NEGATIVE NEGATIVE Final    Comment: (NOTE) The Xpert Xpress SARS-CoV-2/FLU/RSV plus assay is intended as an aid in the diagnosis of influenza from Nasopharyngeal swab specimens and should not be used as a sole basis for treatment. Nasal washings and aspirates are unacceptable for Xpert Xpress SARS-CoV-2/FLU/RSV testing.  Fact Sheet for Patients: EntrepreneurPulse.com.au  Fact Sheet for Healthcare Providers: IncredibleEmployment.be  This test is not yet approved or cleared by the [place] FDA and has been authorized for detection and/or diagnosis of SARS-CoV-2 by FDA under an Emergency Use Authorization (EUA). This EUA will remain in effect (meaning this test can be used) for the duration of the COVID-19 declaration under Section 564(b)(1) of the Act, 21 U.S.C. section 360bbb-3(b)(1), unless the authorization is terminated or revoked.  Performed at [facility], [place] [address], [place], [place] [address]   Urine Culture     Status: None   Collection Time: 06/07/21  1:56 PM   Specimen: Urine, Clean Catch  Result Value Ref Range Status   Specimen Description URINE, CLEAN CATCH  Final   Special Requests NONE  Final   Culture   Final    NO GROWTH Performed at [facility], [place] [address], [place], [place] [address]    Report Status 06/08/2021 FINAL  Final     Labs:  [id number] Labs   Lab Results  Component Value Date   [place] NEGATIVE 06/07/2021      Basic Metabolic Panel: Recent Labs  Lab 06/07/21 1245 06/08/21 0256 06/09/21 0146 06/10/21 0217  NA 145 145 143 144  K 3.9 3.2* 3.3* 4.4  CL 109 104 107 110  CO2 25 25 27 25    GLUCOSE 131* 97 97 103*  BUN 26* 23 22 24*  CREATININE 1.69* 1.58* 1.60* 1.56*  CALCIUM 9.7 10.2 9.7 9.4  MG  --   --  1.8  --     CBC: Recent Labs  Lab 06/07/21 1245 06/08/21 0256 06/09/21 0146  WBC 5.4 6.2 6.3  NEUTROABS 4.6  --   --   HGB 13.0 13.9 14.0  HCT 40.0 41.2 42.3  MCV 93.2 90.9 90.8  PLT 217 209 195    BNP: BNP (last 3 results) Recent Labs    06/07/21 1245  BNP 335.7*    CBG: Recent Labs  Lab 06/10/21 0710 06/10/21 1220 06/10/21 1624 06/10/21 2100 06/11/21 0619  GLUCAP 95 90 100* 90 94     IMAGING STUDIES DG Chest 2 View  Result Date: 06/07/2021 CLINICAL DATA:  Shortness of breath with lower extremity edema. EXAM: CHEST - 2 VIEW COMPARISON:  05/07/2007. FINDINGS: Lungs are hyperexpanded. The lungs are clear without focal pneumonia, edema, pneumothorax or pleural effusion. The cardio pericardial silhouette is enlarged. Bones are diffusely demineralized. Telemetry leads overlie the chest. IMPRESSION: Hyperexpansion without acute cardiopulmonary findings. Electronically Signed   By: Misty Stanley M.D.   On: 06/07/2021 13:20   CT HEAD WO CONTRAST (5MM)  Result Date: 06/10/2021 CLINICAL DATA:  88 year old female with recent pontine infarct. EXAM: CT HEAD WITHOUT CONTRAST TECHNIQUE: Contiguous axial images were obtained from the base of the skull through the vertex without intravenous contrast. RADIATION DOSE REDUCTION: This exam was performed according to the departmental dose-optimization program which includes automated exposure control, adjustment of the mA and/or kV according to patient size and/or use of iterative reconstruction technique. COMPARISON:  06/07/2021 MR and CT, and prior studies FINDINGS: Brain: The known dorsal pontine infarct is difficult to identify on this study. No evidence of new infarction, hemorrhage, hydrocephalus, extra-axial collection or mass lesion/mass effect. Atrophy and chronic small-vessel white matter ischemic changes are  again noted. Possible tiny remote LEFT caudate head infarct again noted. Vascular: Carotid and vertebral atherosclerotic calcifications are noted. Skull: Normal. Negative for fracture or focal lesion. Sinuses/Orbits: No acute abnormality. Other: None IMPRESSION: 1. No evidence of new acute intracranial abnormality. The known dorsal pontine infarct is difficult to identify on this study. 2. Atrophy and chronic small-vessel white matter ischemic changes. Electronically Signed   By: Margarette Canada M.D.   On: 06/10/2021 13:51   CT HEAD WO CONTRAST (5MM)  Result Date: 06/07/2021 CLINICAL DATA:  Left eye vision problems EXAM: CT HEAD WITHOUT CONTRAST TECHNIQUE: Contiguous axial images were obtained from the base of the skull through the vertex without intravenous contrast. RADIATION DOSE REDUCTION: This exam was performed according to the departmental dose-optimization program which includes automated exposure control, adjustment of the mA and/or kV according to patient size and/or use of iterative reconstruction technique. COMPARISON:  Hypo CT dated January 05, 2019 FINDINGS: Brain: Chronic white matter ischemic change. No evidence of acute infarction, hemorrhage, hydrocephalus, extra-axial collection or mass lesion/mass effect. Vascular: No hyperdense vessel or unexpected calcification. Skull: Normal. Negative for fracture or focal lesion. Sinuses/Orbits: No acute finding. Other:  None. IMPRESSION: No acute intracranial abnormality. Electronically Signed   By: Yetta Glassman M.D.   On: 06/07/2021 13:37   MR ANGIO HEAD WO CONTRAST  Result Date: 06/10/2021 CLINICAL DATA:  88 year old female with weakness and vision changes. Recent lacunar infarct in the dorsal brainstem on MRI. EXAM: MRA HEAD WITHOUT CONTRAST TECHNIQUE: Angiographic images of the Circle of Willis were acquired using MRA technique without intravenous contrast. COMPARISON:  MRI 06/07/2021. FINDINGS: Anterior circulation: Antegrade flow in both ICA  siphons. Bilateral siphon irregularity and evidence of calcified plaque. Mild to moderate left anterior genu stenosis. Mild right anterior genu and supraclinoid stenosis. Normal posterior communicating artery origins. Patent carotid termini. Patent MCA and ACA origins. Diminutive or absent anterior communicating artery. Visible ACA branches are within normal limits. MCA M1 segments and bifurcations are patent without stenosis. Visible MCA branches are within normal limits. Posterior circulation: Diminutive vertebrobasilar system on the basis of fetal type bilateral PCA origins. Distal left vertebral artery antegrade flow appears normal. The distal right vertebral artery is diminutive with decreased flow. However, both PICA origins seem to remain patent. Patent vertebrobasilar junction. Highly diminutive basilar artery with mild to moderate mid basilar irregularity and stenosis (series 1037, image 11). Patent SCA and PCA origins. Bilateral PCA branches are within normal limits. Anatomic variants: Bilateral fetal PCA origins. Other: No intracranial mass effect or ventriculomegaly. IMPRESSION: 1. Negative for large vessel occlusion. 2. Positive for intracranial atherosclerosis, superimposed on diminutive vertebrobasilar system on the basis of fetal type bilateral PCA origins. Mild to moderate mid Basilar Artery stenosis. Attenuated flow in the distal Right Vertebral Artery raising the possibility of upstream stenosis. Mild to moderate bilateral ICA Siphon stenosis greater on the left. Electronically Signed   By: Genevie Ann M.D.   On: 06/10/2021 05:38   MR BRAIN WO CONTRAST  Result Date: 06/07/2021 CLINICAL DATA:  Provided history: Neuro deficit, acute, stroke suspected. Additional history provided: Progressively worsening blurry vision, generalized weakness, left-sided gaze noted. EXAM: MRI HEAD WITHOUT CONTRAST TECHNIQUE: Multiplanar, multiecho pulse sequences of the brain and surrounding structures were obtained  without intravenous contrast. COMPARISON:  Head CT 06/07/2021. Report from brain MRI 07/08/2000 (images unavailable). FINDINGS: Brain: Mild-to-moderate generalized cerebral atrophy. Comparatively mild cerebellar atrophy. 5 mm focus of restricted diffusion within the midline dorsal pons compatible with acute infarct (series 3, image 13) (series 4, image 16). Mild-to-moderate multifocal T2 FLAIR hyperintense signal abnormality within the cerebral white matter, nonspecific but compatible chronic small vessel ischemic disease. Prominent perivascular space versus small chronic lacunar infarct within the left caudate head (series 10, image 17). Punctate chronic microhemorrhage within the left parietal lobe (series 8, image 15). Additional foci of T2* signal loss within the bilateral basal ganglia, which may reflect mineralization or additional punctate chronic microhemorrhages. No evidence of an intracranial mass. No extra-axial fluid collection. No midline shift. Vascular: Maintained flow voids within the proximal large arterial vessels. Skull and upper cervical spine: No focal suspicious marrow lesion. Incompletely assessed cervical spondylosis Sinuses/Orbits: Visualized orbits show no acute finding. Bilateral ocular lens replacements. Mild mucosal thickening within the bilateral ethmoid, sphenoid and maxillary sinuses. Other: Trace fluid within the bilateral mastoid air cells. IMPRESSION: 5 mm acute infarct within the midline dorsal pons. Mild-to-moderate chronic small vessel ischemic changes within the cerebral white matter. Mild-to-moderate generalized cerebral atrophy. Comparatively mild cerebellar atrophy. Mild paranasal sinus mucosal thickening. Trace fluid within the bilateral mastoid air cells. Electronically Signed   By: Kellie Simmering D.O.   On: 06/07/2021 15:32  DG Swallowing Func-Speech Pathology  Result Date: 06/08/2021 Table formatting from the original result was not included. Objective Swallowing  Evaluation: Type of Study: MBS-Modified Barium Swallow Study  Patient Details Name: Mia [name] MRN: [id number] Date of Birth: February 29, 1926 Today's Date: 06/08/2021 Time: SLP Start Time (ACUTE ONLY): 56 -SLP Stop Time (ACUTE ONLY): 1400 SLP Time Calculation (min) (ACUTE ONLY): 19 min Past Medical History: Past Medical History: Diagnosis Date  Arthritis   oa  Chronic kidney disease   chronic kidney diseaase follow by primary md  Diabetes mellitus without complication ([place])   Diet controlled  Former smoker   H/O ventricular fibrillation 10 yrs ago dx  Hypertension   Prediabetes   Seasonal allergies   TIA (transient ischemic attack) none recent Past Surgical History: Past Surgical History: Procedure Laterality Date  NM MYOCAR PERF WALL MOTION  01/2008  lexiscan myoview - normal pattern of perfusion in all regions, EF 92%, no significant ischemia demonstated  TONSILLECTOMY  age 55  and adenoids  TOTAL KNEE ARTHROPLASTY Right 10/06/2015  Procedure: RIGHT TOTAL KNEE ARTHROPLASTY;  Surgeon: [name], MD;  Location: [facility];  Service: Orthopedics;  Laterality: Right;  TRANSTHORACIC ECHOCARDIOGRAM  12/2005  mild DUST, EF=>55%; mild mitral annular calcif; mild-mod aortic root calcif HPI: [name] who presented to the ED with vision changes and several week history of weakness. MRI brain 1/19:  5 mm acute infarct within the midline dorsal pons. Pt failed the [place] due to coughing. PMH: CKD, T2DM, HTN, gout, hx of TIA, VF.  No data recorded  Recommendations for follow up therapy are one component of a multi-disciplinary discharge planning process, led by the attending physician.  Recommendations may be updated based on patient status, additional functional criteria and insurance authorization. Assessment / Plan / Recommendation Clinical Impressions 06/08/2021 Clinical Impression Pt presents with mild pharyngeal dysphagia characterized by reduced tongue base retraction, and a pharyngeal delay. She demonstrated  vallecular residue, and penetration (PAS 2, 3) with thin liquids via straw. A single instance of aspiration (PAS 7) was noted with when pt consumed the 60mm barium tablet with thin liquids via straw. Aspirate originated from pyriform sinus residue when pt was leaning forward. However, no other instances of aspiration were noted. Amount of vallecular residue was reduced with a liquid wash. A chin tuck posture was effective in eliminating laryngeal invasion and reducing pharyngeal residue. Pt was elated by her perceived improvement in bolus movement with use of these strategies and she verbalized agreement with using them during meals. A dysphagia 3 diet with thin liquids is recommended with observance of swallowing precautions. SLP will continue to follow pt. SLP Visit Diagnosis Dysphagia, pharyngeal phase (R13.13) Attention and concentration deficit following -- Frontal lobe and executive function deficit following -- Impact on safety and function Mild aspiration risk   Treatment Recommendations 06/08/2021 Treatment Recommendations Therapy as outlined in treatment plan below   Prognosis 06/08/2021 Prognosis for Safe Diet Advancement Good Barriers to Reach Goals Cognitive deficits Barriers/Prognosis Comment -- Diet Recommendations 06/08/2021 SLP Diet Recommendations Dysphagia 3 (Mech soft) solids;Thin liquid Liquid Administration via Cup;Straw Medication Administration Whole meds with puree Compensations Slow rate;Small sips/bites;Chin tuck;Follow solids with liquid Postural Changes Seated upright at 90 degrees   Other Recommendations 06/08/2021 Recommended Consults -- Oral Care Recommendations Oral care BID Other Recommendations -- Follow Up Recommendations (No Data) Assistance recommended at discharge -- Functional Status Assessment Patient has had a recent decline in their functional status and demonstrates the ability to make  significant improvements in function in a reasonable and predictable amount of time.  Frequency and Duration  06/08/2021 Speech Therapy Frequency (ACUTE ONLY) min 2x/week Treatment Duration 2 weeks   Oral Phase 06/08/2021 Oral Phase WFL Oral - Pudding Teaspoon -- Oral - Pudding Cup -- Oral - Honey Teaspoon -- Oral - Honey Cup -- Oral - Nectar Teaspoon -- Oral - Nectar Cup -- Oral - Nectar Straw -- Oral - Thin Teaspoon -- Oral - Thin Cup -- Oral - Thin Straw -- Oral - Puree -- Oral - Mech Soft -- Oral - Regular -- Oral - Multi-Consistency -- Oral - Pill -- Oral Phase - Comment --  Pharyngeal Phase 06/08/2021 Pharyngeal Phase Impaired Pharyngeal- Pudding Teaspoon -- Pharyngeal -- Pharyngeal- Pudding Cup -- Pharyngeal -- Pharyngeal- Honey Teaspoon -- Pharyngeal -- Pharyngeal- Honey Cup -- Pharyngeal -- Pharyngeal- Nectar Teaspoon -- Pharyngeal -- Pharyngeal- Nectar Cup -- Pharyngeal -- Pharyngeal- Nectar Straw Delayed swallow initiation-pyriform sinuses;Delayed swallow initiation-vallecula;Pharyngeal residue - valleculae;Reduced tongue base retraction Pharyngeal -- Pharyngeal- Thin Teaspoon -- Pharyngeal -- Pharyngeal- Thin Cup Delayed swallow initiation-pyriform sinuses;Delayed swallow initiation-vallecula;Pharyngeal residue - valleculae;Reduced tongue base retraction Pharyngeal -- Pharyngeal- Thin Straw Delayed swallow initiation-pyriform sinuses;Delayed swallow initiation-vallecula;Pharyngeal residue - valleculae;Reduced tongue base retraction;Penetration/Aspiration during swallow;Penetration/Apiration after swallow Pharyngeal Material enters airway, remains ABOVE vocal cords then ejected out;Material enters airway, remains ABOVE vocal cords and not ejected out Pharyngeal- Puree Delayed swallow initiation-pyriform sinuses;Delayed swallow initiation-vallecula;Pharyngeal residue - valleculae;Reduced tongue base retraction Pharyngeal -- Pharyngeal- Mechanical Soft Delayed swallow initiation-pyriform sinuses;Delayed swallow initiation-vallecula;Pharyngeal residue - valleculae;Reduced tongue base retraction  Pharyngeal -- Pharyngeal- Regular Delayed swallow initiation-pyriform sinuses;Delayed swallow initiation-vallecula;Pharyngeal residue - valleculae;Reduced tongue base retraction Pharyngeal -- Pharyngeal- Multi-consistency -- Pharyngeal -- Pharyngeal- Pill Delayed swallow initiation-pyriform sinuses;Delayed swallow initiation-vallecula;Pharyngeal residue - valleculae;Reduced tongue base retraction;Penetration/Apiration after swallow Pharyngeal Material enters airway, passes BELOW cords and not ejected out despite cough attempt by patient Pharyngeal Comment --  Cervical Esophageal Phase  06/08/2021 Cervical Esophageal Phase WFL Pudding Teaspoon -- Pudding Cup -- Honey Teaspoon -- Honey Cup -- Nectar Teaspoon -- Nectar Cup -- Nectar Straw -- Thin Teaspoon -- Thin Cup -- Thin Straw -- Puree -- Mechanical Soft -- Regular -- Multi-consistency -- Pill -- Cervical Esophageal Comment -- Shanika I. Hardin Negus, Linda, Coleman Office number [phone] Pager [phone] Horton Marshall 06/08/2021, 2:53 PM                     ECHOCARDIOGRAM COMPLETE  Result Date: 06/08/2021    ECHOCARDIOGRAM REPORT   Patient Name:   Mia [name] Date of Exam: 06/08/2021 Medical Rec #:  [id number]    Height:       67.0 in Accession #:    [id number]   Weight:       123.2 lb Date of Birth:  27-Dec-1925     BSA:          1.646 m Patient Age:    8 years     BP:           171/102 mmHg Patient Gender: F            HR:           79 bpm. Exam Location:  Inpatient Procedure: 2D Echo Indications:    Stroke  History:        Patient has prior history of Echocardiogram examinations, most                 recent 12/20/2005. Risk Factors:Hypertension and Diabetes.  Sonographer:    Jefferey Pica Referring Phys: [id number] [place]  1. Left ventricular ejection fraction, by estimation, is 60 to 65%. The left ventricle has normal function. The left ventricle has no regional wall motion abnormalities. There is mild  concentric left ventricular hypertrophy of the basal segme nt No LVOT  obstruction  2. Left atrial size was severely dilated.  3. Right atrial size was moderately dilated.  4. Mild mitral valve regurgitation.  5. The aortic valve is grossly normal. Aortic valve regurgitation is trivial.  6. Right ventricular systolic function is normal. The right ventricular size is normal.  7. The inferior vena cava is normal in size with greater than 50% respiratory variability, suggesting right atrial pressure of 3 mmHg. Comparison(s): No prior Echocardiogram. FINDINGS  Left Ventricle: Left ventricular ejection fraction, by estimation, is 60 to 65%. The left ventricle has normal function. The left ventricle has no regional wall motion abnormalities. The left ventricular internal cavity size was normal in size. There is  mild concentric left ventricular hypertrophy of the basal segment. Left ventricular diastolic parameters are consistent with Grade I diastolic dysfunction (impaired relaxation). Right Ventricle: The right ventricular size is normal. No increase in right ventricular wall thickness. Right ventricular systolic function is normal. The tricuspid regurgitant velocity is 2.28 m/s, and with an assumed right atrial pressure of 5 mmHg, the estimated right ventricular systolic pressure is 63.8 mmHg. Left Atrium: Left atrial size was severely dilated. Right Atrium: Right atrial size was moderately dilated. Pericardium: There is no evidence of pericardial effusion. Mitral Valve: There is moderate calcification of the mitral valve leaflet(s). Mild mitral valve regurgitation. Tricuspid Valve: The tricuspid valve is grossly normal. Tricuspid valve regurgitation is trivial. Aortic Valve: The aortic valve is grossly normal. Aortic valve regurgitation is trivial. Aortic regurgitation PHT measures 454 msec. Aortic valve peak gradient measures 5.1 mmHg. Pulmonic Valve: The pulmonic valve was not well visualized. Pulmonic valve  regurgitation is not visualized. Aorta: The aortic root and ascending aorta are structurally normal, with no evidence of dilitation. Venous: The inferior vena cava is normal in size with greater than 50% respiratory variability, suggesting right atrial pressure of 3 mmHg. IAS/Shunts: No atrial level shunt detected by color flow Doppler.  LEFT VENTRICLE PLAX 2D LVIDd:         4.00 cm   Diastology LVIDs:         2.50 cm   LV e' lateral:   3.65 cm/s LV PW:         1.00 cm   LV E/e' lateral: 23.6 LV IVS:        1.00 cm LVOT diam:     1.90 cm LV SV:         47 LV SV Index:   28 LVOT Area:     2.84 cm  IVC IVC diam: 1.30 cm LEFT ATRIUM             Index        RIGHT ATRIUM           Index LA diam:        3.40 cm 2.07 cm/m   RA Area:     13.40 cm LA Vol (A2C):   93.7 ml 56.92 ml/m  RA Volume:   34.30 ml  20.84 ml/m LA Vol (A4C):   47.3 ml 28.73 ml/m LA Biplane Vol: 73.9 ml 44.89 ml/m  AORTIC VALVE  PULMONIC VALVE AV Area (Vmax): 2.30 cm     PV Vmax:       0.78 m/s AV Vmax:        113.40 cm/s  PV Peak grad:  2.4 mmHg AV Peak Grad:   5.1 mmHg LVOT Vmax:      91.95 cm/s LVOT Vmean:     54.600 cm/s LVOT VTI:       0.165 m AI PHT:         454 msec  AORTA Ao Root diam: 3.10 cm Ao Asc diam:  3.00 cm MITRAL VALVE                TRICUSPID VALVE MV Area (PHT): 1.93 cm     TR Peak grad:   20.8 mmHg MV Decel Time: 393 msec     TR Vmax:        228.00 cm/s MR Peak grad: 174.8 mmHg MR Vmax:      661.00 cm/s   SHUNTS MV E velocity: 86.30 cm/s   Systemic VTI:  0.17 m MV A velocity: 133.00 cm/s  Systemic Diam: 1.90 cm MV E/A ratio:  0.65 Mary Scientist, physiological signed by Phineas Inches Signature Date/Time: 06/08/2021/11:24:26 AM    Final    VAS US CAROTID  Result Date: 06/08/2021 Carotid Arterial Duplex Study Patient Name:  Mia [name]  Date of Exam:   06/08/2021 Medical Rec #: [id number]     Accession #:    [id number] Date of Birth: 10/09/1925      Patient Gender: F Patient Age:   62 years Exam Location:  [facility] Procedure:      VAS US CAROTID Referring Phys: PRAMOD SETHI --------------------------------------------------------------------------------  Indications:  CVA. Risk Factors: Hypertension, Diabetes, past history of smoking. Limitations   Today's exam was limited due to the high bifurcation of the               carotid. Performing Technologist: Darlin Coco RDMS, RVT  Examination Guidelines: A complete evaluation includes B-mode imaging, spectral Doppler, color Doppler, and power Doppler as needed of all accessible portions of each vessel. Bilateral testing is considered an integral part of a complete examination. Limited examinations for reoccurring indications may be performed as noted.  Right Carotid Findings: +----------+--------+--------+--------+------------------+--------+             PSV cm/s EDV cm/s Stenosis Plaque Description Comments  +----------+--------+--------+--------+------------------+--------+  CCA Prox   48       9                                              +----------+--------+--------+--------+------------------+--------+  CCA Distal 44       12                                             +----------+--------+--------+--------+------------------+--------+  ICA Prox   54       17       1-39%    heterogenous                 +----------+--------+--------+--------+------------------+--------+  ICA Distal 98       34                                             +----------+--------+--------+--------+------------------+--------+  ECA        56                                                      +----------+--------+--------+--------+------------------+--------+ +----------+--------+-------+----------------+-------------------+             PSV cm/s EDV cms Describe         Arm Pressure (mmHG)  +----------+--------+-------+----------------+-------------------+  Subclavian 102              Multiphasic, WNL                      +----------+--------+-------+----------------+-------------------+  +---------+--------+--+--------+--+----------+  Vertebral PSV cm/s 44 EDV cm/s 20 Retrograde  +---------+--------+--+--------+--+----------+  Left Carotid Findings: +----------+--------+--------+--------+------------------+------------------+             PSV cm/s EDV cm/s Stenosis Plaque Description Comments            +----------+--------+--------+--------+------------------+------------------+  CCA Prox   60       10                                                       +----------+--------+--------+--------+------------------+------------------+  CCA Distal 49       10                                                       +----------+--------+--------+--------+------------------+------------------+  ICA Prox   93       30                                   intimal thickening  +----------+--------+--------+--------+------------------+------------------+  ICA Distal 82       23                                                       +----------+--------+--------+--------+------------------+------------------+  ECA        65                                                                +----------+--------+--------+--------+------------------+------------------+ +----------+--------+--------+----------------+-------------------+             PSV cm/s EDV cm/s Describe         Arm Pressure (mmHG)  +----------+--------+--------+----------------+-------------------+  Subclavian 157               Multiphasic, WNL                      +----------+--------+--------+----------------+-------------------+ +---------+--------+--------+--------------+  Vertebral PSV cm/s EDV cm/s Not identified  +---------+--------+--------+--------------+ No recent prior studies.  Summary: Right Carotid:  Velocities in the right ICA are consistent with a 1-39% stenosis. Left Carotid: The extracranial vessels were near-normal with only minimal wall               thickening or plaque. Vertebrals:  Right vertebral artery demonstrates retrograde  flow. Left vertebral              artery was not visualized. Subclavians: Normal flow hemodynamics were seen in bilateral subclavian              arteries. *See table(s) above for measurements and observations.  Electronically signed by Antony Contras MD on 06/08/2021 at 1:46:16 PM.    Final    VAS [place] TRANSCRANIAL DOPPLER  Result Date: 06/08/2021  Transcranial Doppler Patient Name:  Mia [name]  Date of Exam:   06/08/2021 Medical Rec #: [id number]     Accession #:    [id number] Date of Birth: Oct 02, 1925      Patient Gender: F Patient Age:   89 years Exam Location:  [facility] Procedure:      VAS [place] TRANSCRANIAL DOPPLER Referring Phys: PRAMOD SETHI --------------------------------------------------------------------------------  Indications: Stroke. Limitations for diagnostic windows: Unable to insonate right transtemporal window. Unable to insonate left transtemporal window. Comparison Study: No prior studies. Performing Technologist: Darlin Coco RDMS, RVT  Examination Guidelines: A complete evaluation includes B-mode imaging, spectral Doppler, color Doppler, and power Doppler as needed of all accessible portions of each vessel. Bilateral testing is considered an integral part of a complete examination. Limited examinations for reoccurring indications may be performed as noted.  +----------+-------------+----------+-----------+------------------+  RIGHT TCD  Right VM (cm) Depth (cm) Pulsatility      Comment        +----------+-------------+----------+-----------+------------------+  MCA                                             Unable to insonate  +----------+-------------+----------+-----------+------------------+  ACA                                             Unable to insonate  +----------+-------------+----------+-----------+------------------+  Term ICA                                        Unable to insonate  +----------+-------------+----------+-----------+------------------+  PCA                                              Unable to insonate  +----------+-------------+----------+-----------+------------------+  Opthalmic      17.00                   1.17                         +----------+-------------+----------+-----------+------------------+  ICA siphon     40.00                   1.13                         +----------+-------------+----------+-----------+------------------+  Vertebral      10.00                   0.87                         +----------+-------------+----------+-----------+------------------+  +----------+------------+----------+-----------+------------------+  LEFT TCD   Left VM (cm) Depth (cm) Pulsatility      Comment        +----------+------------+----------+-----------+------------------+  MCA                                            Unable to insonate  +----------+------------+----------+-----------+------------------+  ACA                                            Unable to insonate  +----------+------------+----------+-----------+------------------+  Term ICA                                       Unable to insonate  +----------+------------+----------+-----------+------------------+  PCA                                            Unable to insonate  +----------+------------+----------+-----------+------------------+  Opthalmic     16.00                   1.36                         +----------+------------+----------+-----------+------------------+  ICA siphon    20.00                   1.80                         +----------+------------+----------+-----------+------------------+  Vertebral     -14.00                  1.25                         +----------+------------+----------+-----------+------------------+  +------------+------+-------+               VM cm  Comment  +------------+------+-------+  Prox Basilar -17.00          +------------+------+-------+ Summary:  Absent bitemporal and poor suboccipital window limits evaluation of anterior and posterior  circulation vessels.Antegrade flow in both opthalmic and carotid siphons noted. *See table(s) above for TCD measurements and observations.  Diagnosing physician: Antony Contras MD Electronically signed by Antony Contras MD on 06/08/2021 at 1:49:33 PM.    Final     DISCHARGE EXAMINATION: Vitals:   06/10/21 2335 06/11/21 0315 06/11/21 0538 06/11/21 0813  BP: (!) 165/90 (!) 174/96  (!) 163/94  Pulse: (!) 51 84  67  Resp: 14 19  16   Temp: 98 F (36.7 C) 98.2 F (36.8 C)  (!) 97.5 F (36.4 C)  TempSrc: Oral   Oral  SpO2: 97% 98%  100%  Weight:   55.1 kg   Height:       General appearance:  Awake alert.  In no distress Resp: Clear to auscultation bilaterally.  Normal effort Cardio: S1-S2 is normal regular.  No S3-S4.  No rubs murmurs or bruit GI: Abdomen is soft.  Nontender nondistended.  Bowel sounds are present normal.  No masses organomegaly   DISPOSITION: SNF  Discharge Instructions     Ambulatory referral to Neurology   Complete by: As directed    Follow up with stroke clinic NP (Jessica Vanschaick or Cecille Rubin, if both not available, consider Zachery Dauer, or [facility]) at [facility] in about 4 weeks. Thanks.   Call MD for:  difficulty breathing, headache or visual disturbances   Complete by: As directed    Call MD for:  extreme fatigue   Complete by: As directed    Call MD for:  persistant dizziness or light-headedness   Complete by: As directed    Call MD for:  persistant nausea and vomiting   Complete by: As directed    Call MD for:  severe uncontrolled pain   Complete by: As directed    Call MD for:  temperature >100.4   Complete by: As directed    Diet - low sodium heart healthy   Complete by: As directed    Discharge instructions   Complete by: As directed    Please review discharge summary for instructions.  You were cared for by a hospitalist during your hospital stay. If you have any questions about your discharge medications or the care you received while you were in  the hospital after you are discharged, you can call the unit and asked to speak with the hospitalist on call if the hospitalist that took care of you is not available. Once you are discharged, your primary care physician will handle any further medical issues. Please note that NO REFILLS for any discharge medications will be authorized once you are discharged, as it is imperative that you return to your primary care physician (or establish a relationship with a primary care physician if you do not have one) for your aftercare needs so that they can reassess your need for medications and monitor your lab values. If you do not have a primary care physician, you can call [phone] for a physician referral.   Increase activity slowly   Complete by: As directed    No wound care   Complete by: As directed           Allergies as of 06/11/2021   No Known Allergies      Medication List     STOP taking these medications    allopurinol 100 MG tablet Commonly known as: ZYLOPRIM   clopidogrel 75 MG tablet Commonly known as: PLAVIX   hydrochlorothiazide 12.5 MG tablet Commonly known as: HYDRODIURIL   hydrochlorothiazide 25 MG tablet Commonly known as: HYDRODIURIL   metoprolol succinate 25 MG 24 hr tablet Commonly known as: TOPROL-XL   potassium chloride 10 MEQ tablet Commonly known as: KLOR-CON       TAKE these medications    amLODipine 5 MG tablet Commonly known as: NORVASC Take 1 tablet (5 mg total) by mouth daily.   apixaban 2.5 MG Tabs tablet Commonly known as: ELIQUIS Take 1 tablet (2.5 mg total) by mouth 2 (two) times daily.   ketorolac 0.5 % ophthalmic solution Commonly known as: ACULAR INSTILL 1 DROP IN RIGHT EYE FOUR TIMES DAILY What changed: See the new instructions.   metoprolol tartrate 25 MG tablet Commonly known as: LOPRESSOR Take 1 tablet (25 mg total)  by mouth 2 (two) times daily. What changed:  medication strength how much to take when to take this    One-A-Day Proactive 65+ Tabs Take 1 tablet by mouth daily with breakfast.   polyethylene glycol 17 g packet Commonly known as: MIRALAX / GLYCOLAX Take 17 g by mouth daily.   senna-docusate 8.6-50 MG tablet Commonly known as: Senokot-S Take 2 tablets by mouth 2 (two) times daily.          Follow-up Information     [facility]. Schedule an appointment as soon as possible for a visit in 1 month(s).   Specialty: Neurology Why: stroke clinic Contact information: [place] [phone]                TOTAL DISCHARGE TIME: 35 minutes  [place]  [facility] Pager on [email or web address]  06/11/2021, 10:31 AM

## 2021-06-11 NOTE — Progress Notes (Addendum)
Occupational Therapy Treatment Patient Details Name: Mia [name] MRN: [id number] DOB: 1926-04-04 Today's Date: 06/11/2021   History of present illness Pt is 88 yo female who presented to ED with several week history of weakness; also with vision changes and headache that started on the morning of admission.  MRI brain revealed 68mm acute infarct in midline dorsal pons. PMH includes CKD, T2DM, HTN, gout, hx of TIA, hx of VF.   OT comments  Pt progressing towards acute OT goals. Received sitting up in recliner. Further assessment of vision and occlusion taping done this session. It appears taping helps with the diplopia but pt is visually distracted by the occlusion taping. Reduced tape width a bit. Today, pt is able to track medially a bit with left eye which is an improvement since last session. Full visual assessment details below. Daughter present at end of session and able to briefly discuss occlusion taping with her. Pt needed min to mod A to stand from recliner. Increased L ankle pain in weightbearing limiting standing tolerance. D/c recommendation remains appropriate.    Recommendations for follow up therapy are one component of a multi-disciplinary discharge planning process, led by the attending physician.  Recommendations may be updated based on patient status, additional functional criteria and insurance authorization.    Follow Up Recommendations  Skilled nursing-short term rehab (<3 hours/day)    Assistance Recommended at Discharge Frequent or constant Supervision/Assistance  Patient can return home with the following      Equipment Recommendations  Other (comment) (defer to next venue)    Recommendations for Other Services      Precautions / Restrictions Precautions Precautions: Fall Precaution Comments: visual deficits; L foot pain limiting weight bearing Restrictions Weight Bearing Restrictions: No       Mobility Bed Mobility               General bed mobility  comments: up in recliner    Transfers Overall transfer level: Needs assistance Equipment used: Rolling walker (2 wheels) Transfers: Sit to/from Stand Sit to Stand: Min assist, Mod assist           General transfer comment: pt stood one time from recliner. extra time and effort, min A and pt pressing legs into recliner for stability. Increased L ankle pain in weightbearing position and + dizziness limiting factors     Balance Overall balance assessment: Needs assistance Sitting-balance support: Feet supported Sitting balance-Leahy Scale: Good     Standing balance support: Bilateral upper extremity supported Standing balance-Leahy Scale: Poor Standing balance comment: UE support needed for static standing balance                           ADL either performed or assessed with clinical judgement   ADL Overall ADL's : Needs assistance/impaired                     Lower Body Dressing: Sit to/from stand;Moderate assistance                 General ADL Comments: Pt will need +1 assist any time she is on her feet to transfer or mobilize. Vision, balance, pain , and cogntition limiting safety and ability with functional transfers.    Extremity/Trunk Assessment Upper Extremity Assessment Upper Extremity Assessment: Generalized weakness   Lower Extremity Assessment Lower Extremity Assessment: Defer to PT evaluation   Cervical / Trunk Assessment Cervical / Trunk Assessment: Kyphotic    Vision  Vision Assessment?: Yes Eye Alignment: Impaired (comment) (left eye exotropia) Ocular Range of Motion: Other (comment) (decreased smoothness, eye fatigue) Tracking/Visual Pursuits: Requires cues, head turns, or add eye shifts to track;Decreased smoothness of horizontal tracking (limited left eye medial movement but better than previous session) Saccades: Overshoots;Additional eye shifts occurred during testing;Additional head turns occurred during  testing;Decreased speed of saccadic movement Convergence: Impaired (comment) Visual Fields: Other (comment) (inconsistent outside of central vision) Diplopia Assessment: Disappears with one eye closed;Present in primary gaze;Present all the time/all directions Depth Perception: Overshoots Additional Comments: left eye ptosis. Further assessment of vision and occlusion taping done this session. It appears taping helps with the diplopia but pt is distracted by the occlusion taping. reduced tape width a bit.   Perception     Praxis      Cognition Arousal/Alertness: Awake/alert Behavior During Therapy: Flat affect Overall Cognitive Status: No family/caregiver present to determine baseline cognitive functioning Area of Impairment: Attention, Memory, Safety/judgement, Following commands, Problem solving                 Orientation Level: Time, Situation Current Attention Level: Sustained Memory: Decreased short-term memory, Decreased recall of precautions Following Commands: Follows one step commands inconsistently, Follows one step commands with increased time Safety/Judgement: Decreased awareness of deficits Awareness: Intellectual, Emergent Problem Solving: Slow processing, Difficulty sequencing          Exercises      Shoulder Instructions       General Comments daughter arrived at end of session, educated briefly on occlusion tape.    Pertinent Vitals/ Pain       Pain Assessment Pain Assessment: Faces Faces Pain Scale: Hurts whole lot Pain Location: L ankle (dorsally) with weight bearing Pain Descriptors / Indicators: Discomfort, Grimacing, Moaning, Guarding Pain Intervention(s): Monitored during session, Limited activity within patient's tolerance, Repositioned  Home Living Family/patient expects to be discharged to:: Private residence Living Arrangements: Alone   Type of Home: Independent living facility       Home Layout: One level                Home Equipment: Engineer, maintenance (IT) (2 wheels)          Prior Functioning/Environment              Frequency  Min 3X/week        Progress Toward Goals  OT Goals(current goals can now be found in the care plan section)  Progress towards OT goals: Progressing toward goals  Acute Rehab OT Goals Patient Stated Goal: feel better, see better, not fall OT Goal Formulation: With patient Time For Goal Achievement: 06/22/21 Potential to Achieve Goals: Good ADL Goals Pt Will Perform Grooming: sitting;with supervision;with set-up Pt Will Perform Upper Body Dressing: with set-up;with supervision;sitting Pt Will Perform Lower Body Dressing: with min assist;sit to/from stand Pt Will Transfer to Toilet: with min assist;ambulating Pt Will Perform Toileting - Clothing Manipulation and hygiene: with min assist;sit to/from stand Additional ADL Goal #1: Pt and caregiver will be independent with managing occlusion tape.  Plan Discharge plan remains appropriate    Co-evaluation                 AM-PAC OT "6 Clicks" Daily Activity     Outcome Measure   Help from another person eating meals?: A Little Help from another person taking care of personal grooming?: A Little Help from another person toileting, which includes using toliet, bedpan, or urinal?: A Lot Help from another person bathing (  including washing, rinsing, drying)?: A Lot Help from another person to put on and taking off regular upper body clothing?: A Lot Help from another person to put on and taking off regular lower body clothing?: A Lot 6 Click Score: 14    End of Session Equipment Utilized During Treatment: Rolling walker (2 wheels)  OT Visit Diagnosis: Unsteadiness on feet (R26.81);Muscle weakness (generalized) (M62.81);Other abnormalities of gait and mobility (R26.89);Pain;Dizziness and giddiness (R42);Other symptoms and signs involving cognitive function;Cognitive communication deficit  (R41.841);Other (comment);Other symptoms and signs involving the nervous system (R29.898)   Activity Tolerance Patient limited by fatigue   Patient Left in chair;with call bell/phone within reach;with chair alarm set;with family/visitor present   Nurse Communication          Time: 3968-8648 OT Time Calculation (min): 33 min  Charges: OT General Charges $OT Visit: 1 Visit OT Treatments $Self Care/Home Management : 8-22 mins $Therapeutic Activity: 8-22 mins  Tyrone Schimke, OT Acute Rehabilitation Services Office: [phone]   Mia [name] 06/11/2021, 2:29 PM

## 2021-06-11 NOTE — Progress Notes (Signed)
Physical Therapy Treatment Patient Details Name: Mia [name] MRN: [id number] DOB: 23-Aug-1925 Today's Date: 06/11/2021   History of Present Illness Pt is 88 yo female who presented to ED with several week history of weakness; also with vision changes and headache that started on the morning of admission.  MRI brain showed an acute infarct. PMH includes CKD, T2DM, HTN, gout, hx of TIA, hx of VF.    PT Comments    Pt progressing slowly towards physical therapy goals. Mobility limited this session by L ankle pain, worse with attempts at weight bearing. Pt reports ankle began hurting several weeks ago without any notable injury that pt can remember. Noted the L ankle is mildly swollen. Soft tissue massage performed on the ankle for edema management and pain relief. Pt reports decrease in pain from 9/10 to 2/10 after massage, and pt was able to achieve full stand afterwards, when she was unable to tolerate any weight bearing prior to massage. SNF level rehab remains appropriate. Will continue to follow and progress as able per POC.    Recommendations for follow up therapy are one component of a multi-disciplinary discharge planning process, led by the attending physician.  Recommendations may be updated based on patient status, additional functional criteria and insurance authorization.  Follow Up Recommendations  Skilled nursing-short term rehab (<3 hours/day)     Assistance Recommended at Discharge Frequent or constant Supervision/Assistance  Patient can return home with the following A lot of help with walking and/or transfers;A lot of help with bathing/dressing/bathroom;Assistance with cooking/housework;Direct supervision/assist for medications management;Assist for transportation   Equipment Recommendations  None recommended by PT    Recommendations for Other Services       Precautions / Restrictions Precautions Precautions: Fall Precaution Comments: visual deficits; L foot pain limiting  weight bearing Restrictions Weight Bearing Restrictions: No     Mobility  Bed Mobility               General bed mobility comments: Pt was received sitting up in the recliner chair    Transfers Overall transfer level: Needs assistance Equipment used: Rolling walker (2 wheels) Transfers: Sit to/from Stand Sit to Stand: Min assist, Mod assist           General transfer comment: Several attempts to achieve full stand. Initially attempted x2 and was not able to achieve full stand due to pain in the L ankle. Pt attempted to transition hands to walker and asked to sit back down due to pain. On second attempt, pt was able to achieve full stand with BUE's on the walker. Pt statically stood for ~30 seconds and was unable to tolerate shifting weight enough to progress to ambulation.    Ambulation/Gait               General Gait Details: Limited due to pain, unable to progress to gait training at this time.   Stairs             Wheelchair Mobility    Modified Rankin (Stroke Patients Only) Modified Rankin (Stroke Patients Only) Pre-Morbid Rankin Score: Moderate disability Modified Rankin: Moderately severe disability     Balance Overall balance assessment: Needs assistance Sitting-balance support: Feet supported Sitting balance-Leahy Scale: Good Sitting balance - Comments: attempting to don socks, but too difficult with toe nails too long   Standing balance support: Bilateral upper extremity supported Standing balance-Leahy Scale: Poor Standing balance comment: UE support needed for balance  Cognition Arousal/Alertness: Awake/alert Behavior During Therapy: Flat affect Overall Cognitive Status: No family/caregiver present to determine baseline cognitive functioning Area of Impairment: Attention, Memory, Safety/judgement                 Orientation Level: Time, Situation Current Attention Level:  Selective Memory: Decreased short-term memory, Decreased recall of precautions Following Commands: Follows one step commands inconsistently, Follows one step commands with increased time Safety/Judgement: Decreased awareness of deficits Awareness: Intellectual, Emergent Problem Solving: Slow processing          Exercises Other Exercises Other Exercises: Gentle AAROM into plantar flexion and dorsiflexion x10 each way Other Exercises: Gentle stretching of ankle into DF and PF 2x20" each way    General Comments        Pertinent Vitals/Pain Pain Assessment Pain Assessment: 0-10 Pain Score: 9  Pain Location: L ankle (dorsally) with weight bearing Pain Descriptors / Indicators: Discomfort, Grimacing, Moaning, Guarding Pain Intervention(s): Limited activity within patient's tolerance, Monitored during session, Repositioned    Home Living Family/patient expects to be discharged to:: Private residence Living Arrangements: Alone   Type of Home: Independent living facility         Home Layout: One level Home Equipment: Engineer, maintenance (IT) (2 wheels)      Prior Function            PT Goals (current goals can now be found in the care plan section) Acute Rehab PT Goals Patient Stated Goal: to go home PT Goal Formulation: With patient Time For Goal Achievement: 06/22/21 Potential to Achieve Goals: Fair Progress towards PT goals: Progressing toward goals    Frequency    Min 3X/week      PT Plan Frequency needs to be updated    Co-evaluation              AM-PAC PT "6 Clicks" Mobility   Outcome Measure  Help needed turning from your back to your side while in a flat bed without using bedrails?: A Little Help needed moving from lying on your back to sitting on the side of a flat bed without using bedrails?: A Little Help needed moving to and from a bed to a chair (including a wheelchair)?: A Lot Help needed standing up from a chair using your arms  (e.g., wheelchair or bedside chair)?: A Lot Help needed to walk in hospital room?: Total Help needed climbing 3-5 steps with a railing? : Total 6 Click Score: 12    End of Session Equipment Utilized During Treatment: Gait belt Activity Tolerance: Patient tolerated treatment well Patient left: in chair;with call bell/phone within reach;with chair alarm set;with family/visitor present Nurse Communication: Mobility status PT Visit Diagnosis: Other abnormalities of gait and mobility (R26.89);Muscle weakness (generalized) (M62.81);Other symptoms and signs involving the nervous system (R29.898)     Time: 5053-9767 PT Time Calculation (min) (ACUTE ONLY): 39 min  Charges:  $Gait Training: 23-37 mins $Massage: 8-22 mins                     Mia [name], PT, DPT Acute Rehabilitation Services Pager: [phone] Office: [phone]    Thelma Comp 06/11/2021, 12:40 PM

## 2021-06-11 NOTE — Care Management Important Message (Signed)
Important Message  Patient Details  Name: Mia [name] MRN: [id number] Date of Birth: 20-Dec-1925   Medicare Important Message Given:  Yes     [name] [address]ine [address] 06/11/2021, 2:47 PM

## 2021-06-12 LAB — GLUCOSE, CAPILLARY
Glucose-Capillary: 107 mg/dL — ABNORMAL HIGH (ref 70–99)
Glucose-Capillary: 143 mg/dL — ABNORMAL HIGH (ref 70–99)

## 2021-06-12 LAB — RESP PANEL BY RT-PCR (FLU A&B, COVID) ARPGX2
Influenza A by PCR: NEGATIVE
Influenza B by PCR: NEGATIVE
SARS Coronavirus 2 by RT PCR: NEGATIVE

## 2021-06-12 NOTE — TOC Transition Note (Addendum)
Transition of Care [facility]) - CM/SW Discharge Note   Patient Details  Name: Mia [name] MRN: [id number] Date of Birth: 04/02/1926  [facility]) CM/SW Contact:  Geralynn Ochs, LCSW Phone Number: 06/12/2021, 10:54 AM   Clinical Narrative:   Nurse to call report to [phone], Room 503A.    Final next level of care: Skilled Nursing Facility Barriers to Discharge: Barriers Resolved   Patient Goals and CMS Choice Patient states their goals for this hospitalization and ongoing recovery are:: to get rehab and get back home CMS Medicare.gov Compare Post Acute Care list provided to:: Patient Choice offered to / list presented to : Patient  Discharge Placement              Patient chooses bed at: [facility] Patient to be transferred to facility by: [facility] Name of family member notified: Richardson Landry Patient and family notified of of transfer: 06/12/21  Discharge Plan and Services     Post Acute Care Choice: [facility]                               Social Determinants of Health (SDOH) Interventions     Readmission Risk Interventions No flowsheet data found.

## 2021-06-12 NOTE — Discharge Summary (Signed)
[facility]  Physician Discharge Summary   Patient ID: Mia [name] MRN: [id number] DOB/AGE: 12-11-1925 88 y.o.  Admit date: 06/07/2021 Discharge date:   06/12/2021   PCP: Ngetich, Nelda Bucks, NP  DISCHARGE DIAGNOSES:  Principal Problem:   CVA (cerebral vascular accident) ([place]) Active Problems:   AF (paroxysmal atrial fibrillation) ([place])   Essential hypertension, benign   CKD (chronic kidney disease) stage 4, GFR 15-29 ml/min (HCC)   Type 2 diabetes mellitus without complication (HCC)   Swelling of lower leg   Mild cognitive impairment with memory loss   Aphasia   Hypokalemia   Acute CVA (cerebrovascular accident) ([place])   RECOMMENDATIONS FOR OUTPATIENT FOLLOW UP: Check CBC and basic metabolic panel in 3 to 4 days Ambulatory referral sent to neurology for outpatient follow-up.   Home Health: Going to SNF Equipment/Devices: None  CODE STATUS: DNR  DISCHARGE CONDITION: fair  Diet recommendation: Dysphagia 3 diet with thin liquids  INITIAL HISTORY: 88 y.o. female with medical history significant of CKD, T2DM, HTN, gout, hx of TIA, hx of VF who presented to ED with several week history of weakness and vision changes that started on the morning of admission.  Also had a headache.  MRI brain showed an acute infarct.  Patient was hospitalized for further management.  Patient also has a history of aphasia which is at baseline.  Patient was hospitalized and seen by neurology.  She was also found to have atrial fibrillation which is a new diagnosis for her.  Started on Eliquis after discussions with patient and her family.  Consultations: Neurology  Procedures: Transthoracic echocardiogram Carotid Doppler  HOSPITAL COURSE:    * CVA (cerebral vascular accident) ([place])- (present on admission) Patient with significant visual disturbances. MRI showed acute infarct.  Neurology is following.  Appears to have newly diagnosed paroxysmal atrial fibrillation. Carotid Doppler  did not reveal any significant stenosis.  MRA head was ordered by neurology. Patient was initially started on aspirin and Plavix.  Anticoagulation issue was discussed with patient and her son.  After extensive discussions regarding risks and benefits family and patient have elected to go ahead with the low-dose Eliquis which was initiated yesterday.  Discontinue aspirin and Plavix.  This was discussed with neurology who agrees with plan. LDL 67.  HbA1c is 6.3.  No statin as LDL is below goal. Seen by PT OT and SLP.  Skilled nursing facility is recommended for short-term rehab. Echocardiogram reveals normal systolic function.  Left atrial dilatation noted. On dysphagia 3 diet with thin liquids.  AF (paroxysmal atrial fibrillation) ([place])- (present on admission) Appears to be new diagnosis for her. Family was unaware of this.  Started on low-dose beta-blocker.  Heart rate is well controlled. CHA2DS2-VASc score of 6.  After extensive discussions with patient and family patient started on Eliquis. Echocardiogram shows normal systolic function.  Left atrial dilatation noted. TSH 1.25.  Essential hypertension, benign- (present on admission) Hypokalemia  Permissive hypertension was allowed initially.  Continued on metoprolol due to atrial fibrillation.  Started on amlodipine.  Holding HCTZ due to elevated creatinine.  Potassium is improved.    CKD (chronic kidney disease) stage 4, GFR 15-29 ml/min (HCC)- (present on admission) Seems to be close to baseline.  Monitor periodically.  Type 2 diabetes mellitus without complication (HCC) JSE8B 6.3.  Diet controlled at home.  Monitor CBGs at SNF.  Low glucose levels noted yesterday.  Oral intake to be encouraged.  Glucerna between meals   Swelling of lower leg- (present on admission)  Has a known history of pedal edema.  Was given 1 dose of Lasix.  Swelling has improved.  Echocardiogram shows normal systolic function  Aphasia- (present on admission) Has  been going on x 2-3 years.  Mild cognitive impairment with memory loss- (present on admission) Seems to be at baseline.  Hypokalemia Improved.  Monitor at [place].      Patient is stable.  Okay for discharge to skilled nursing facility when bed is available.   PERTINENT LABS:  The results of significant diagnostics from this hospitalization (including imaging, microbiology, ancillary and laboratory) are listed below for reference.    Microbiology: Recent Results (from the past 240 hour(s))  Resp Panel by RT-PCR (Flu A&B, Covid) Nasopharyngeal Swab     Status: None   Collection Time: 06/07/21 12:45 PM   Specimen: Nasopharyngeal Swab; Nasopharyngeal(NP) swabs in vial transport medium  Result Value Ref Range Status   SARS Coronavirus 2 by RT PCR NEGATIVE NEGATIVE Final    Comment: (NOTE) SARS-CoV-2 target nucleic acids are NOT DETECTED.  The SARS-CoV-2 RNA is generally detectable in upper respiratory specimens during the acute phase of infection. The lowest concentration of SARS-CoV-2 viral copies this assay can detect is 138 copies/mL. A negative result does not preclude SARS-Cov-2 infection and should not be used as the sole basis for treatment or other patient management decisions. A negative result may occur with  improper specimen collection/handling, submission of specimen other than nasopharyngeal swab, presence of viral mutation(s) within the areas targeted by this assay, and inadequate number of viral copies(<138 copies/mL). A negative result must be combined with clinical observations, patient history, and epidemiological information. The expected result is Negative.  Fact Sheet for Patients:  EntrepreneurPulse.com.au  Fact Sheet for Healthcare Providers:  IncredibleEmployment.be  This test is no t yet approved or cleared by the [place] FDA and  has been authorized for detection and/or diagnosis of  SARS-CoV-2 by FDA under an Emergency Use Authorization (EUA). This EUA will remain  in effect (meaning this test can be used) for the duration of the COVID-19 declaration under Section 564(b)(1) of the Act, 21 U.S.C.section 360bbb-3(b)(1), unless the authorization is terminated  or revoked sooner.       Influenza A by PCR NEGATIVE NEGATIVE Final   Influenza B by PCR NEGATIVE NEGATIVE Final    Comment: (NOTE) The Xpert Xpress SARS-CoV-2/FLU/RSV plus assay is intended as an aid in the diagnosis of influenza from Nasopharyngeal swab specimens and should not be used as a sole basis for treatment. Nasal washings and aspirates are unacceptable for Xpert Xpress SARS-CoV-2/FLU/RSV testing.  Fact Sheet for Patients: EntrepreneurPulse.com.au  Fact Sheet for Healthcare Providers: IncredibleEmployment.be  This test is not yet approved or cleared by the [place] FDA and has been authorized for detection and/or diagnosis of SARS-CoV-2 by FDA under an Emergency Use Authorization (EUA). This EUA will remain in effect (meaning this test can be used) for the duration of the COVID-19 declaration under Section 564(b)(1) of the Act, 21 U.S.C. section 360bbb-3(b)(1), unless the authorization is terminated or revoked.  Performed at [facility], [place] [address], [place], [place] [address]   Urine Culture     Status: None   Collection Time: 06/07/21  1:56 PM   Specimen: Urine, Clean Catch  Result Value Ref Range Status   Specimen Description URINE, CLEAN CATCH  Final   Special Requests NONE  Final   Culture   Final    NO GROWTH Performed at [facility]  [facility], [place] [address], [place], [place] [address]    Report Status 06/08/2021 FINAL  Final     Labs:  [id number] Labs   Lab Results  Component Value Date   [place] NEGATIVE 06/07/2021      Basic Metabolic Panel: Recent Labs  Lab 06/07/21 1245 06/08/21 0256 06/09/21 0146  06/10/21 0217  NA 145 145 143 144  K 3.9 3.2* 3.3* 4.4  CL 109 104 107 110  CO2 25 25 27 25   GLUCOSE 131* 97 97 103*  BUN 26* 23 22 24*  CREATININE 1.69* 1.58* 1.60* 1.56*  CALCIUM 9.7 10.2 9.7 9.4  MG  --   --  1.8  --     CBC: Recent Labs  Lab 06/07/21 1245 06/08/21 0256 06/09/21 0146  WBC 5.4 6.2 6.3  NEUTROABS 4.6  --   --   HGB 13.0 13.9 14.0  HCT 40.0 41.2 42.3  MCV 93.2 90.9 90.8  PLT 217 209 195    BNP: BNP (last 3 results) Recent Labs    06/07/21 1245  BNP 335.7*    CBG: Recent Labs  Lab 06/11/21 1215 06/11/21 1732 06/11/21 1930 06/11/21 2128 06/12/21 0617  GLUCAP 99 60* 219* 170* 107*     IMAGING STUDIES DG Chest 2 View  Result Date: 06/07/2021 CLINICAL DATA:  Shortness of breath with lower extremity edema. EXAM: CHEST - 2 VIEW COMPARISON:  05/07/2007. FINDINGS: Lungs are hyperexpanded. The lungs are clear without focal pneumonia, edema, pneumothorax or pleural effusion. The cardio pericardial silhouette is enlarged. Bones are diffusely demineralized. Telemetry leads overlie the chest. IMPRESSION: Hyperexpansion without acute cardiopulmonary findings. Electronically Signed   By: Misty Stanley M.D.   On: 06/07/2021 13:20   CT HEAD WO CONTRAST (5MM)  Result Date: 06/10/2021 CLINICAL DATA:  88 year old female with recent pontine infarct. EXAM: CT HEAD WITHOUT CONTRAST TECHNIQUE: Contiguous axial images were obtained from the base of the skull through the vertex without intravenous contrast. RADIATION DOSE REDUCTION: This exam was performed according to the departmental dose-optimization program which includes automated exposure control, adjustment of the mA and/or kV according to patient size and/or use of iterative reconstruction technique. COMPARISON:  06/07/2021 MR and CT, and prior studies FINDINGS: Brain: The known dorsal pontine infarct is difficult to identify on this study. No evidence of new infarction, hemorrhage, hydrocephalus, extra-axial  collection or mass lesion/mass effect. Atrophy and chronic small-vessel white matter ischemic changes are again noted. Possible tiny remote LEFT caudate head infarct again noted. Vascular: Carotid and vertebral atherosclerotic calcifications are noted. Skull: Normal. Negative for fracture or focal lesion. Sinuses/Orbits: No acute abnormality. Other: None IMPRESSION: 1. No evidence of new acute intracranial abnormality. The known dorsal pontine infarct is difficult to identify on this study. 2. Atrophy and chronic small-vessel white matter ischemic changes. Electronically Signed   By: Margarette Canada M.D.   On: 06/10/2021 13:51   CT HEAD WO CONTRAST (5MM)  Result Date: 06/07/2021 CLINICAL DATA:  Left eye vision problems EXAM: CT HEAD WITHOUT CONTRAST TECHNIQUE: Contiguous axial images were obtained from the base of the skull through the vertex without intravenous contrast. RADIATION DOSE REDUCTION: This exam was performed according to the departmental dose-optimization program which includes automated exposure control, adjustment of the mA and/or kV according to patient size and/or use of iterative reconstruction technique. COMPARISON:  Hypo CT dated January 05, 2019 FINDINGS: Brain: Chronic white matter ischemic change. No evidence of acute infarction, hemorrhage, hydrocephalus, extra-axial collection or mass lesion/mass effect. Vascular: No  hyperdense vessel or unexpected calcification. Skull: Normal. Negative for fracture or focal lesion. Sinuses/Orbits: No acute finding. Other: None. IMPRESSION: No acute intracranial abnormality. Electronically Signed   By: Yetta Glassman M.D.   On: 06/07/2021 13:37   MR ANGIO HEAD WO CONTRAST  Result Date: 06/10/2021 CLINICAL DATA:  88 year old female with weakness and vision changes. Recent lacunar infarct in the dorsal brainstem on MRI. EXAM: MRA HEAD WITHOUT CONTRAST TECHNIQUE: Angiographic images of the Circle of Willis were acquired using MRA technique without  intravenous contrast. COMPARISON:  MRI 06/07/2021. FINDINGS: Anterior circulation: Antegrade flow in both ICA siphons. Bilateral siphon irregularity and evidence of calcified plaque. Mild to moderate left anterior genu stenosis. Mild right anterior genu and supraclinoid stenosis. Normal posterior communicating artery origins. Patent carotid termini. Patent MCA and ACA origins. Diminutive or absent anterior communicating artery. Visible ACA branches are within normal limits. MCA M1 segments and bifurcations are patent without stenosis. Visible MCA branches are within normal limits. Posterior circulation: Diminutive vertebrobasilar system on the basis of fetal type bilateral PCA origins. Distal left vertebral artery antegrade flow appears normal. The distal right vertebral artery is diminutive with decreased flow. However, both PICA origins seem to remain patent. Patent vertebrobasilar junction. Highly diminutive basilar artery with mild to moderate mid basilar irregularity and stenosis (series 1037, image 11). Patent SCA and PCA origins. Bilateral PCA branches are within normal limits. Anatomic variants: Bilateral fetal PCA origins. Other: No intracranial mass effect or ventriculomegaly. IMPRESSION: 1. Negative for large vessel occlusion. 2. Positive for intracranial atherosclerosis, superimposed on diminutive vertebrobasilar system on the basis of fetal type bilateral PCA origins. Mild to moderate mid Basilar Artery stenosis. Attenuated flow in the distal Right Vertebral Artery raising the possibility of upstream stenosis. Mild to moderate bilateral ICA Siphon stenosis greater on the left. Electronically Signed   By: Genevie Ann M.D.   On: 06/10/2021 05:38   MR BRAIN WO CONTRAST  Result Date: 06/07/2021 CLINICAL DATA:  Provided history: Neuro deficit, acute, stroke suspected. Additional history provided: Progressively worsening blurry vision, generalized weakness, left-sided gaze noted. EXAM: MRI HEAD WITHOUT  CONTRAST TECHNIQUE: Multiplanar, multiecho pulse sequences of the brain and surrounding structures were obtained without intravenous contrast. COMPARISON:  Head CT 06/07/2021. Report from brain MRI 07/08/2000 (images unavailable). FINDINGS: Brain: Mild-to-moderate generalized cerebral atrophy. Comparatively mild cerebellar atrophy. 5 mm focus of restricted diffusion within the midline dorsal pons compatible with acute infarct (series 3, image 13) (series 4, image 16). Mild-to-moderate multifocal T2 FLAIR hyperintense signal abnormality within the cerebral white matter, nonspecific but compatible chronic small vessel ischemic disease. Prominent perivascular space versus small chronic lacunar infarct within the left caudate head (series 10, image 17). Punctate chronic microhemorrhage within the left parietal lobe (series 8, image 15). Additional foci of T2* signal loss within the bilateral basal ganglia, which may reflect mineralization or additional punctate chronic microhemorrhages. No evidence of an intracranial mass. No extra-axial fluid collection. No midline shift. Vascular: Maintained flow voids within the proximal large arterial vessels. Skull and upper cervical spine: No focal suspicious marrow lesion. Incompletely assessed cervical spondylosis Sinuses/Orbits: Visualized orbits show no acute finding. Bilateral ocular lens replacements. Mild mucosal thickening within the bilateral ethmoid, sphenoid and maxillary sinuses. Other: Trace fluid within the bilateral mastoid air cells. IMPRESSION: 5 mm acute infarct within the midline dorsal pons. Mild-to-moderate chronic small vessel ischemic changes within the cerebral white matter. Mild-to-moderate generalized cerebral atrophy. Comparatively mild cerebellar atrophy. Mild paranasal sinus mucosal thickening. Trace fluid within the bilateral mastoid  air cells. Electronically Signed   By: Kellie Simmering D.O.   On: 06/07/2021 15:32   DG Swallowing Func-Speech  Pathology  Result Date: 06/08/2021 Table formatting from the original result was not included. Objective Swallowing Evaluation: Type of Study: MBS-Modified Barium Swallow Study  Patient Details Name: Mia [name] MRN: [id number] Date of Birth: 1925-12-16 Today's Date: 06/08/2021 Time: SLP Start Time (ACUTE ONLY): 33 -SLP Stop Time (ACUTE ONLY): 1400 SLP Time Calculation (min) (ACUTE ONLY): 19 min Past Medical History: Past Medical History: Diagnosis Date  Arthritis   oa  Chronic kidney disease   chronic kidney diseaase follow by primary md  Diabetes mellitus without complication ([place])   Diet controlled  Former smoker   H/O ventricular fibrillation 10 yrs ago dx  Hypertension   Prediabetes   Seasonal allergies   TIA (transient ischemic attack) none recent Past Surgical History: Past Surgical History: Procedure Laterality Date  NM MYOCAR PERF WALL MOTION  01/2008  lexiscan myoview - normal pattern of perfusion in all regions, EF 92%, no significant ischemia demonstated  TONSILLECTOMY  age 58  and adenoids  TOTAL KNEE ARTHROPLASTY Right 10/06/2015  Procedure: RIGHT TOTAL KNEE ARTHROPLASTY;  Surgeon: [name], MD;  Location: [facility];  Service: Orthopedics;  Laterality: Right;  TRANSTHORACIC ECHOCARDIOGRAM  12/2005  mild DUST, EF=>55%; mild mitral annular calcif; mild-mod aortic root calcif HPI: [name] who presented to the ED with vision changes and several week history of weakness. MRI brain 1/19:  5 mm acute infarct within the midline dorsal pons. Pt failed the [place] due to coughing. PMH: CKD, T2DM, HTN, gout, hx of TIA, VF.  No data recorded  Recommendations for follow up therapy are one component of a multi-disciplinary discharge planning process, led by the attending physician.  Recommendations may be updated based on patient status, additional functional criteria and insurance authorization. Assessment / Plan / Recommendation Clinical Impressions 06/08/2021 Clinical Impression Pt presents  with mild pharyngeal dysphagia characterized by reduced tongue base retraction, and a pharyngeal delay. She demonstrated vallecular residue, and penetration (PAS 2, 3) with thin liquids via straw. A single instance of aspiration (PAS 7) was noted with when pt consumed the 26mm barium tablet with thin liquids via straw. Aspirate originated from pyriform sinus residue when pt was leaning forward. However, no other instances of aspiration were noted. Amount of vallecular residue was reduced with a liquid wash. A chin tuck posture was effective in eliminating laryngeal invasion and reducing pharyngeal residue. Pt was elated by her perceived improvement in bolus movement with use of these strategies and she verbalized agreement with using them during meals. A dysphagia 3 diet with thin liquids is recommended with observance of swallowing precautions. SLP will continue to follow pt. SLP Visit Diagnosis Dysphagia, pharyngeal phase (R13.13) Attention and concentration deficit following -- Frontal lobe and executive function deficit following -- Impact on safety and function Mild aspiration risk   Treatment Recommendations 06/08/2021 Treatment Recommendations Therapy as outlined in treatment plan below   Prognosis 06/08/2021 Prognosis for Safe Diet Advancement Good Barriers to Reach Goals Cognitive deficits Barriers/Prognosis Comment -- Diet Recommendations 06/08/2021 SLP Diet Recommendations Dysphagia 3 (Mech soft) solids;Thin liquid Liquid Administration via Cup;Straw Medication Administration Whole meds with puree Compensations Slow rate;Small sips/bites;Chin tuck;Follow solids with liquid Postural Changes Seated upright at 90 degrees   Other Recommendations 06/08/2021 Recommended Consults -- Oral Care Recommendations Oral care BID Other Recommendations -- Follow Up Recommendations (No Data) Assistance recommended at discharge -- Functional  Status Assessment Patient has had a recent decline in their functional status and  demonstrates the ability to make significant improvements in function in a reasonable and predictable amount of time. Frequency and Duration  06/08/2021 Speech Therapy Frequency (ACUTE ONLY) min 2x/week Treatment Duration 2 weeks   Oral Phase 06/08/2021 Oral Phase WFL Oral - Pudding Teaspoon -- Oral - Pudding Cup -- Oral - Honey Teaspoon -- Oral - Honey Cup -- Oral - Nectar Teaspoon -- Oral - Nectar Cup -- Oral - Nectar Straw -- Oral - Thin Teaspoon -- Oral - Thin Cup -- Oral - Thin Straw -- Oral - Puree -- Oral - Mech Soft -- Oral - Regular -- Oral - Multi-Consistency -- Oral - Pill -- Oral Phase - Comment --  Pharyngeal Phase 06/08/2021 Pharyngeal Phase Impaired Pharyngeal- Pudding Teaspoon -- Pharyngeal -- Pharyngeal- Pudding Cup -- Pharyngeal -- Pharyngeal- Honey Teaspoon -- Pharyngeal -- Pharyngeal- Honey Cup -- Pharyngeal -- Pharyngeal- Nectar Teaspoon -- Pharyngeal -- Pharyngeal- Nectar Cup -- Pharyngeal -- Pharyngeal- Nectar Straw Delayed swallow initiation-pyriform sinuses;Delayed swallow initiation-vallecula;Pharyngeal residue - valleculae;Reduced tongue base retraction Pharyngeal -- Pharyngeal- Thin Teaspoon -- Pharyngeal -- Pharyngeal- Thin Cup Delayed swallow initiation-pyriform sinuses;Delayed swallow initiation-vallecula;Pharyngeal residue - valleculae;Reduced tongue base retraction Pharyngeal -- Pharyngeal- Thin Straw Delayed swallow initiation-pyriform sinuses;Delayed swallow initiation-vallecula;Pharyngeal residue - valleculae;Reduced tongue base retraction;Penetration/Aspiration during swallow;Penetration/Apiration after swallow Pharyngeal Material enters airway, remains ABOVE vocal cords then ejected out;Material enters airway, remains ABOVE vocal cords and not ejected out Pharyngeal- Puree Delayed swallow initiation-pyriform sinuses;Delayed swallow initiation-vallecula;Pharyngeal residue - valleculae;Reduced tongue base retraction Pharyngeal -- Pharyngeal- Mechanical Soft Delayed swallow  initiation-pyriform sinuses;Delayed swallow initiation-vallecula;Pharyngeal residue - valleculae;Reduced tongue base retraction Pharyngeal -- Pharyngeal- Regular Delayed swallow initiation-pyriform sinuses;Delayed swallow initiation-vallecula;Pharyngeal residue - valleculae;Reduced tongue base retraction Pharyngeal -- Pharyngeal- Multi-consistency -- Pharyngeal -- Pharyngeal- Pill Delayed swallow initiation-pyriform sinuses;Delayed swallow initiation-vallecula;Pharyngeal residue - valleculae;Reduced tongue base retraction;Penetration/Apiration after swallow Pharyngeal Material enters airway, passes BELOW cords and not ejected out despite cough attempt by patient Pharyngeal Comment --  Cervical Esophageal Phase  06/08/2021 Cervical Esophageal Phase WFL Pudding Teaspoon -- Pudding Cup -- Honey Teaspoon -- Honey Cup -- Nectar Teaspoon -- Nectar Cup -- Nectar Straw -- Thin Teaspoon -- Thin Cup -- Thin Straw -- Puree -- Mechanical Soft -- Regular -- Multi-consistency -- Pill -- Cervical Esophageal Comment -- Shanika I. Hardin Negus, [place], [place] Office number [phone] Pager [phone] Horton Marshall 06/08/2021, 2:53 PM                     ECHOCARDIOGRAM COMPLETE  Result Date: 06/08/2021    ECHOCARDIOGRAM REPORT   Patient Name:   Mia [name] Date of Exam: 06/08/2021 Medical Rec #:  [id number]    Height:       67.0 in Accession #:    [id number]   Weight:       123.2 lb Date of Birth:  [birth year]     BSA:          1.646 m Patient Age:    53 years     BP:           171/102 mmHg Patient Gender: F            HR:           79 bpm. Exam Location:  Inpatient Procedure: 2D Echo Indications:    Stroke  History:        Patient has prior history of Echocardiogram examinations, most  recent 12/20/2005. Risk Factors:Hypertension and Diabetes.  Sonographer:    Jefferey Pica Referring Phys: [id number] [place]  1. Left ventricular ejection fraction, by estimation, is 60 to  65%. The left ventricle has normal function. The left ventricle has no regional wall motion abnormalities. There is mild concentric left ventricular hypertrophy of the basal segme nt No LVOT  obstruction  2. Left atrial size was severely dilated.  3. Right atrial size was moderately dilated.  4. Mild mitral valve regurgitation.  5. The aortic valve is grossly normal. Aortic valve regurgitation is trivial.  6. Right ventricular systolic function is normal. The right ventricular size is normal.  7. The inferior vena cava is normal in size with greater than 50% respiratory variability, suggesting right atrial pressure of 3 mmHg. Comparison(s): No prior Echocardiogram. FINDINGS  Left Ventricle: Left ventricular ejection fraction, by estimation, is 60 to 65%. The left ventricle has normal function. The left ventricle has no regional wall motion abnormalities. The left ventricular internal cavity size was normal in size. There is  mild concentric left ventricular hypertrophy of the basal segment. Left ventricular diastolic parameters are consistent with Grade I diastolic dysfunction (impaired relaxation). Right Ventricle: The right ventricular size is normal. No increase in right ventricular wall thickness. Right ventricular systolic function is normal. The tricuspid regurgitant velocity is 2.28 m/s, and with an assumed right atrial pressure of 5 mmHg, the estimated right ventricular systolic pressure is 37.1 mmHg. Left Atrium: Left atrial size was severely dilated. Right Atrium: Right atrial size was moderately dilated. Pericardium: There is no evidence of pericardial effusion. Mitral Valve: There is moderate calcification of the mitral valve leaflet(s). Mild mitral valve regurgitation. Tricuspid Valve: The tricuspid valve is grossly normal. Tricuspid valve regurgitation is trivial. Aortic Valve: The aortic valve is grossly normal. Aortic valve regurgitation is trivial. Aortic regurgitation PHT measures 454 msec. Aortic  valve peak gradient measures 5.1 mmHg. Pulmonic Valve: The pulmonic valve was not well visualized. Pulmonic valve regurgitation is not visualized. Aorta: The aortic root and ascending aorta are structurally normal, with no evidence of dilitation. Venous: The inferior vena cava is normal in size with greater than 50% respiratory variability, suggesting right atrial pressure of 3 mmHg. IAS/Shunts: No atrial level shunt detected by color flow Doppler.  LEFT VENTRICLE PLAX 2D LVIDd:         4.00 cm   Diastology LVIDs:         2.50 cm   LV e' lateral:   3.65 cm/s LV PW:         1.00 cm   LV E/e' lateral: 23.6 LV IVS:        1.00 cm LVOT diam:     1.90 cm LV SV:         47 LV SV Index:   28 LVOT Area:     2.84 cm  IVC IVC diam: 1.30 cm LEFT ATRIUM             Index        RIGHT ATRIUM           Index LA diam:        3.40 cm 2.07 cm/m   RA Area:     13.40 cm LA Vol (A2C):   93.7 ml 56.92 ml/m  RA Volume:   34.30 ml  20.84 ml/m LA Vol (A4C):   47.3 ml 28.73 ml/m LA Biplane Vol: 73.9 ml 44.89 ml/m  AORTIC VALVE  PULMONIC VALVE AV Area (Vmax): 2.30 cm     PV Vmax:       0.78 m/s AV Vmax:        113.40 cm/s  PV Peak grad:  2.4 mmHg AV Peak Grad:   5.1 mmHg LVOT Vmax:      91.95 cm/s LVOT Vmean:     54.600 cm/s LVOT VTI:       0.165 m AI PHT:         454 msec  AORTA Ao Root diam: 3.10 cm Ao Asc diam:  3.00 cm MITRAL VALVE                TRICUSPID VALVE MV Area (PHT): 1.93 cm     TR Peak grad:   20.8 mmHg MV Decel Time: 393 msec     TR Vmax:        228.00 cm/s MR Peak grad: 174.8 mmHg MR Vmax:      661.00 cm/s   SHUNTS MV E velocity: 86.30 cm/s   Systemic VTI:  0.17 m MV A velocity: 133.00 cm/s  Systemic Diam: 1.90 cm MV E/A ratio:  0.65 Mary Scientist, physiological signed by Phineas Inches Signature Date/Time: 06/08/2021/11:24:26 AM    Final    VAS US CAROTID  Result Date: 06/08/2021 Carotid Arterial Duplex Study Patient Name:  Mia [name]  Date of Exam:   06/08/2021 Medical Rec #: [id number]     Accession  #:    [id number] Date of Birth: [birth year]      Patient Gender: F Patient Age:   62 years Exam Location:  [facility] Procedure:      VAS US CAROTID Referring Phys: PRAMOD SETHI --------------------------------------------------------------------------------  Indications:  CVA. Risk Factors: Hypertension, Diabetes, past history of smoking. Limitations   Today's exam was limited due to the high bifurcation of the               carotid. Performing Technologist: Darlin Coco RDMS, RVT  Examination Guidelines: A complete evaluation includes B-mode imaging, spectral Doppler, color Doppler, and power Doppler as needed of all accessible portions of each vessel. Bilateral testing is considered an integral part of a complete examination. Limited examinations for reoccurring indications may be performed as noted.  Right Carotid Findings: +----------+--------+--------+--------+------------------+--------+             PSV cm/s EDV cm/s Stenosis Plaque Description Comments  +----------+--------+--------+--------+------------------+--------+  CCA Prox   48       9                                              +----------+--------+--------+--------+------------------+--------+  CCA Distal 44       12                                             +----------+--------+--------+--------+------------------+--------+  ICA Prox   54       17       1-39%    heterogenous                 +----------+--------+--------+--------+------------------+--------+  ICA Distal 98       34                                             +----------+--------+--------+--------+------------------+--------+  ECA        56                                                      +----------+--------+--------+--------+------------------+--------+ +----------+--------+-------+----------------+-------------------+             PSV cm/s EDV cms Describe         Arm Pressure (mmHG)  +----------+--------+-------+----------------+-------------------+  Subclavian 102               Multiphasic, WNL                      +----------+--------+-------+----------------+-------------------+ +---------+--------+--+--------+--+----------+  Vertebral PSV cm/s 44 EDV cm/s 20 Retrograde  +---------+--------+--+--------+--+----------+  Left Carotid Findings: +----------+--------+--------+--------+------------------+------------------+             PSV cm/s EDV cm/s Stenosis Plaque Description Comments            +----------+--------+--------+--------+------------------+------------------+  CCA Prox   60       10                                                       +----------+--------+--------+--------+------------------+------------------+  CCA Distal 49       10                                                       +----------+--------+--------+--------+------------------+------------------+  ICA Prox   93       30                                   intimal thickening  +----------+--------+--------+--------+------------------+------------------+  ICA Distal 82       23                                                       +----------+--------+--------+--------+------------------+------------------+  ECA        65                                                                +----------+--------+--------+--------+------------------+------------------+ +----------+--------+--------+----------------+-------------------+             PSV cm/s EDV cm/s Describe         Arm Pressure (mmHG)  +----------+--------+--------+----------------+-------------------+  Subclavian 157               Multiphasic, WNL                      +----------+--------+--------+----------------+-------------------+ +---------+--------+--------+--------------+  Vertebral PSV cm/s EDV cm/s Not identified  +---------+--------+--------+--------------+ No recent prior studies.  Summary: Right Carotid:  Velocities in the right ICA are consistent with a 1-39% stenosis. Left Carotid: The extracranial vessels were near-normal with  only minimal wall               thickening or plaque. Vertebrals:  Right vertebral artery demonstrates retrograde flow. Left vertebral              artery was not visualized. Subclavians: Normal flow hemodynamics were seen in bilateral subclavian              arteries. *See table(s) above for measurements and observations.  Electronically signed by Antony Contras MD on 06/08/2021 at 1:46:16 PM.    Final    VAS [place] TRANSCRANIAL DOPPLER  Result Date: 06/08/2021  Transcranial Doppler Patient Name:  Mia [name]  Date of Exam:   06/08/2021 Medical Rec #: [id number]     Accession #:    [id number] Date of Birth: Jul 07, 1925      Patient Gender: F Patient Age:   56 years Exam Location:  [facility] Procedure:      VAS [place] TRANSCRANIAL DOPPLER Referring Phys: PRAMOD SETHI --------------------------------------------------------------------------------  Indications: Stroke. Limitations for diagnostic windows: Unable to insonate right transtemporal window. Unable to insonate left transtemporal window. Comparison Study: No prior studies. Performing Technologist: Darlin Coco RDMS, RVT  Examination Guidelines: A complete evaluation includes B-mode imaging, spectral Doppler, color Doppler, and power Doppler as needed of all accessible portions of each vessel. Bilateral testing is considered an integral part of a complete examination. Limited examinations for reoccurring indications may be performed as noted.  +----------+-------------+----------+-----------+------------------+  RIGHT TCD  Right VM (cm) Depth (cm) Pulsatility      Comment        +----------+-------------+----------+-----------+------------------+  MCA                                             Unable to insonate  +----------+-------------+----------+-----------+------------------+  ACA                                             Unable to insonate  +----------+-------------+----------+-----------+------------------+  Term ICA                                         Unable to insonate  +----------+-------------+----------+-----------+------------------+  PCA                                             Unable to insonate  +----------+-------------+----------+-----------+------------------+  Opthalmic      17.00                   1.17                         +----------+-------------+----------+-----------+------------------+  ICA siphon     40.00                   1.13                         +----------+-------------+----------+-----------+------------------+  Vertebral      10.00                   0.87                         +----------+-------------+----------+-----------+------------------+  +----------+------------+----------+-----------+------------------+  LEFT TCD   Left VM (cm) Depth (cm) Pulsatility      Comment        +----------+------------+----------+-----------+------------------+  MCA                                            Unable to insonate  +----------+------------+----------+-----------+------------------+  ACA                                            Unable to insonate  +----------+------------+----------+-----------+------------------+  Term ICA                                       Unable to insonate  +----------+------------+----------+-----------+------------------+  PCA                                            Unable to insonate  +----------+------------+----------+-----------+------------------+  Opthalmic     16.00                   1.36                         +----------+------------+----------+-----------+------------------+  ICA siphon    20.00                   1.80                         +----------+------------+----------+-----------+------------------+  Vertebral     -14.00                  1.25                         +----------+------------+----------+-----------+------------------+  +------------+------+-------+               VM cm  Comment  +------------+------+-------+  Prox Basilar -17.00           +------------+------+-------+ Summary:  Absent bitemporal and poor suboccipital window limits evaluation of anterior and posterior circulation vessels.Antegrade flow in both opthalmic and carotid siphons noted. *See table(s) above for TCD measurements and observations.  Diagnosing physician: Antony Contras MD Electronically signed by Antony Contras MD on 06/08/2021 at 1:49:33 PM.    Final     DISCHARGE EXAMINATION: Vitals:   06/11/21 1727 06/11/21 2356 06/12/21 0328 06/12/21 0732  BP: (!) 156/94 136/75 (!) 152/74 (!) 158/98  Pulse: 64 (!) 56 (!) 105 87  Resp: 17 16 17 17   Temp: 97.9 F (36.6 C) 98 F (36.7 C) 98.7 F (37.1 C) 97.6 F (36.4 C)  TempSrc: Oral  Oral Oral  SpO2: 94% 94%  100%  Weight:      Height:  General appearance: Awake alert.  In no distress Resp: Clear to auscultation bilaterally.  Normal effort Cardio: S1-S2 is normal regular.  No S3-S4.  No rubs murmurs or bruit GI: Abdomen is soft.  Nontender nondistended.  Bowel sounds are present normal.  No masses organomegaly     DISPOSITION: SNF  Discharge Instructions     Ambulatory referral to Neurology   Complete by: As directed    Follow up with stroke clinic NP (Jessica Vanschaick or Cecille Rubin, if both not available, consider Zachery Dauer, or [place]) at [facility] in about 4 weeks. Thanks.   Call MD for:  difficulty breathing, headache or visual disturbances   Complete by: As directed    Call MD for:  extreme fatigue   Complete by: As directed    Call MD for:  persistant dizziness or light-headedness   Complete by: As directed    Call MD for:  persistant nausea and vomiting   Complete by: As directed    Call MD for:  severe uncontrolled pain   Complete by: As directed    Call MD for:  temperature >100.4   Complete by: As directed    Diet - low sodium heart healthy   Complete by: As directed    Discharge instructions   Complete by: As directed    Please review discharge summary for instructions.  You  were cared for by a hospitalist during your hospital stay. If you have any questions about your discharge medications or the care you received while you were in the hospital after you are discharged, you can call the unit and asked to speak with the hospitalist on call if the hospitalist that took care of you is not available. Once you are discharged, your primary care physician will handle any further medical issues. Please note that NO REFILLS for any discharge medications will be authorized once you are discharged, as it is imperative that you return to your primary care physician (or establish a relationship with a primary care physician if you do not have one) for your aftercare needs so that they can reassess your need for medications and monitor your lab values. If you do not have a primary care physician, you can call [phone] for a physician referral.   Increase activity slowly   Complete by: As directed    No wound care   Complete by: As directed           Allergies as of 06/12/2021   No Known Allergies      Medication List     STOP taking these medications    allopurinol 100 MG tablet Commonly known as: ZYLOPRIM   clopidogrel 75 MG tablet Commonly known as: PLAVIX   hydrochlorothiazide 12.5 MG tablet Commonly known as: HYDRODIURIL   hydrochlorothiazide 25 MG tablet Commonly known as: HYDRODIURIL   metoprolol succinate 25 MG 24 hr tablet Commonly known as: TOPROL-XL   potassium chloride 10 MEQ tablet Commonly known as: KLOR-CON       TAKE these medications    amLODipine 5 MG tablet Commonly known as: NORVASC Take 1 tablet (5 mg total) by mouth daily.   apixaban 2.5 MG Tabs tablet Commonly known as: ELIQUIS Take 1 tablet (2.5 mg total) by mouth 2 (two) times daily.   feeding supplement (GLUCERNA SHAKE) Liqd Take 237 mLs by mouth 3 (three) times daily between meals.   ketorolac 0.5 % ophthalmic solution Commonly known as: ACULAR INSTILL 1 DROP IN RIGHT  EYE FOUR TIMES DAILY  What changed: See the new instructions.   metoprolol tartrate 25 MG tablet Commonly known as: LOPRESSOR Take 1 tablet (25 mg total) by mouth 2 (two) times daily. What changed:  medication strength how much to take when to take this   One-A-Day Proactive 65+ Tabs Take 1 tablet by mouth daily with breakfast.   polyethylene glycol 17 g packet Commonly known as: MIRALAX / GLYCOLAX Take 17 g by mouth daily.   senna-docusate 8.6-50 MG tablet Commonly known as: Senokot-S Take 2 tablets by mouth 2 (two) times daily.          Follow-up Information     [facility]. Schedule an appointment as soon as possible for a visit in 1 month(s).   Specialty: Neurology Why: stroke clinic Contact information: [place] [phone]        Ngetich, Nelda Bucks, NP. Schedule an appointment as soon as possible for a visit in 3 week(s).   Specialty: Family Medicine Contact information: [place] [phone] [phone]                 TOTAL DISCHARGE TIME: 51 minutes  [place]  [facility] Pager on [email or web address]  06/12/2021, 10:15 AM

## 2021-06-12 NOTE — Progress Notes (Signed)
Mia [name] to be D/C'd to [facility] per MD order. Report called to Guerry Minors, Therapist, sports at [place]. Skin clean and dry. IV catheter discontinued intact. Site without signs and symptoms of complications. Dressing and pressure applied.  An After Visit Summary was printed and given to PTAR.  Patient escorted via stretcher, and D/C to [facility] via [place].  Melonie Florida  06/12/2021 1:31 PM

## 2021-06-12 NOTE — Progress Notes (Addendum)
Speech Language Pathology Treatment: Dysphagia;Cognitive-Linquistic  Patient Details Name: Mia [name] MRN: [id number] DOB: 1926/02/26 Today's Date: 06/12/2021 Time: 0900-0920 SLP Time Calculation (min) (ACUTE ONLY): 20 min  Assessment / Plan / Recommendation Clinical Impression  Pt seen at bedside to assess tolerance of current diet, continue education related to recent MBS, and continue cognitive treatment. Pt was sitting upright in bed eating breakfast upon arrival of SLP. Pt reported having difficulty with self feeding in bed. SLP assisted pt with a pillow behind her back to improve positioning/comfort, and provided a towel to catch falling food items. Pt was observed self feeding dys 3 solids and thin liquids. She independently recalled the need to alternate solids and liquids, but required cueing with chin tuck position. Pt was receptive to education regarding how to facilitate chin tuck position, using straight straw at chest level. Pt did not exhibit or report difficulty swallowing, in fact reporting she was feeling much better today. She did not exhibit word retrieval deficits during this session. Safe swallow precautions are posted at [facility]. Will continue current diet, following pt for ongoing education and cognitive treatment. Pt would benefit from continued speech therapy services at next venue of care to maximize safety and independence.   HPI HPI: [name] who presented to the ED with vision changes and several week history of weakness. MRI brain 1/19:  5 mm acute infarct within the midline dorsal pons. Pt failed the [place] due to coughing. PMH: CKD, T2DM, HTN, gout, hx of TIA, VF.      SLP Plan  Continue with current plan of care      Recommendations for follow up therapy are one component of a multi-disciplinary discharge planning process, led by the attending physician.  Recommendations may be updated based on patient status, additional functional criteria and insurance  authorization.    Recommendations  Diet recommendations: Dysphagia 3 (mechanical soft);Thin liquid Liquids provided via: Cup;Straw Medication Administration: Other (Comment) (meds whole or crushed in puree) Supervision: Patient able to self feed Compensations: Slow rate;Small sips/bites;Chin tuck;Follow solids with liquid Postural Changes and/or Swallow Maneuvers: Seated upright 90 degrees                Oral Care Recommendations: Oral care BID Follow Up Recommendations:  (continued SLP services at next venue of care) Assistance recommended at discharge: Intermittent Supervision/Assistance SLP Visit Diagnosis: Dysphagia, pharyngeal phase (R13.13) Plan: Continue with current plan of care          Xyla Leisner B. Quentin Ore, [facility], [place] Speech Language Pathologist Office: [phone]  Shonna Chock  06/12/2021, 9:27 AM

## 2021-06-13 ENCOUNTER — Telehealth: Payer: Self-pay | Admitting: *Deleted

## 2021-06-13 NOTE — Telephone Encounter (Signed)
Transition Care Management Unsuccessful Follow-up Telephone Call  Date of discharge and from where:  06/12/2021 [place]  Attempts:  1st Attempt  Reason for unsuccessful TCM follow-up call:  Unable to reach patient Patient Discharged to [facility]

## 2021-06-13 NOTE — Telephone Encounter (Signed)
Asa Lente with [facility] [phone] called and stated that patient was discharged to their facility and they are needing a copy of patient's Immunization Record.   Copy faxed per request to Fax: [phone]

## 2021-06-19 ENCOUNTER — Encounter (INDEPENDENT_AMBULATORY_CARE_PROVIDER_SITE_OTHER): Payer: Medicare PPO | Admitting: Ophthalmology

## 2021-06-25 ENCOUNTER — Encounter (INDEPENDENT_AMBULATORY_CARE_PROVIDER_SITE_OTHER): Payer: Medicare PPO | Admitting: Ophthalmology

## 2021-07-02 ENCOUNTER — Encounter: Payer: Self-pay | Admitting: Podiatry

## 2021-07-02 ENCOUNTER — Ambulatory Visit (INDEPENDENT_AMBULATORY_CARE_PROVIDER_SITE_OTHER): Payer: Medicare PPO | Admitting: Podiatry

## 2021-07-02 ENCOUNTER — Other Ambulatory Visit: Payer: Self-pay

## 2021-07-02 DIAGNOSIS — E1142 Type 2 diabetes mellitus with diabetic polyneuropathy: Secondary | ICD-10-CM | POA: Diagnosis not present

## 2021-07-02 DIAGNOSIS — B351 Tinea unguium: Secondary | ICD-10-CM | POA: Diagnosis not present

## 2021-07-02 DIAGNOSIS — M1990 Unspecified osteoarthritis, unspecified site: Secondary | ICD-10-CM | POA: Insufficient documentation

## 2021-07-02 DIAGNOSIS — K5730 Diverticulosis of large intestine without perforation or abscess without bleeding: Secondary | ICD-10-CM | POA: Insufficient documentation

## 2021-07-02 DIAGNOSIS — Q828 Other specified congenital malformations of skin: Secondary | ICD-10-CM

## 2021-07-02 DIAGNOSIS — Z7409 Other reduced mobility: Secondary | ICD-10-CM | POA: Insufficient documentation

## 2021-07-02 DIAGNOSIS — Z741 Need for assistance with personal care: Secondary | ICD-10-CM | POA: Insufficient documentation

## 2021-07-02 DIAGNOSIS — R194 Change in bowel habit: Secondary | ICD-10-CM | POA: Insufficient documentation

## 2021-07-02 DIAGNOSIS — M79676 Pain in unspecified toe(s): Secondary | ICD-10-CM | POA: Diagnosis not present

## 2021-07-02 DIAGNOSIS — E119 Type 2 diabetes mellitus without complications: Secondary | ICD-10-CM

## 2021-07-02 DIAGNOSIS — R152 Fecal urgency: Secondary | ICD-10-CM | POA: Insufficient documentation

## 2021-07-02 DIAGNOSIS — M2041 Other hammer toe(s) (acquired), right foot: Secondary | ICD-10-CM | POA: Diagnosis not present

## 2021-07-02 DIAGNOSIS — R142 Eructation: Secondary | ICD-10-CM | POA: Insufficient documentation

## 2021-07-02 DIAGNOSIS — Z860100 Personal history of colon polyps, unspecified: Secondary | ICD-10-CM | POA: Insufficient documentation

## 2021-07-02 DIAGNOSIS — I4901 Ventricular fibrillation: Secondary | ICD-10-CM | POA: Insufficient documentation

## 2021-07-02 DIAGNOSIS — R109 Unspecified abdominal pain: Secondary | ICD-10-CM | POA: Insufficient documentation

## 2021-07-02 DIAGNOSIS — R609 Edema, unspecified: Secondary | ICD-10-CM | POA: Insufficient documentation

## 2021-07-02 DIAGNOSIS — R141 Gas pain: Secondary | ICD-10-CM | POA: Insufficient documentation

## 2021-07-02 DIAGNOSIS — R2689 Other abnormalities of gait and mobility: Secondary | ICD-10-CM | POA: Insufficient documentation

## 2021-07-02 DIAGNOSIS — M2042 Other hammer toe(s) (acquired), left foot: Secondary | ICD-10-CM

## 2021-07-02 DIAGNOSIS — L84 Corns and callosities: Secondary | ICD-10-CM

## 2021-07-02 DIAGNOSIS — Z9181 History of falling: Secondary | ICD-10-CM | POA: Insufficient documentation

## 2021-07-08 NOTE — Progress Notes (Signed)
ANNUAL DIABETIC FOOT EXAM  Subjective: Mia [name] presents today for for annual diabetic foot examination.  Patient relates 5 year h/o diabetes.  Patient denies any h/o foot wounds.  Patient denies any numbness, tingling, burning, or pins/needle sensation in feet.  Patient does not monitor blood glucose daily.  Risk factors: diabetes, h/o CVA, h/o TIA, HTN.  Patient states she suffered a stroke on last month. She is recovering and still receiving therapy.   Patient has discomfort of right 4th and 5th digits. Pain is located interdigitally. She denies any redness, drainage or swelling.  Ngetich, Nelda Bucks, NP is patient's PCP. Last visit was Oct 11, 2019.  Past Medical History:  Diagnosis Date   Arthritis    oa   Chronic kidney disease    chronic kidney diseaase follow by primary md   Diabetes mellitus without complication ([place])    Diet controlled   Former smoker    H/O ventricular fibrillation 10 yrs ago dx   Hypertension    Prediabetes    Seasonal allergies    TIA (transient ischemic attack) none recent   Patient Active Problem List   Diagnosis Date Noted   Abdominal pain 07/02/2021   Arthritis 07/02/2021   Change in bowel habit 07/02/2021   Dependent edema 07/02/2021   Diverticular disease of colon 07/02/2021   Fecal urgency 07/02/2021   Flatulence, eructation and gas pain 07/02/2021   History of colonic polyps 07/02/2021   History of fall 07/02/2021   Need for assistance with personal care 07/02/2021   Other abnormalities of gait and mobility 07/02/2021   Impaired mobility 07/02/2021   Ventricular fibrillation ([place]) 07/02/2021   Hypokalemia 06/08/2021   Acute CVA (cerebrovascular accident) ([place]) 06/08/2021   CVA (cerebral vascular accident) ([place]) 06/07/2021   Type 2 diabetes mellitus without complication ([place]) 04/88/8916   AF (paroxysmal atrial fibrillation) ([place]) 06/07/2021   Swelling of lower leg 06/07/2021   Aphasia 06/07/2021   Intermediate stage  nonexudative age-related macular degeneration of both eyes 04/24/2020   Posterior vitreous detachment of right eye 04/24/2020   Degenerative retinal drusen of right eye 04/24/2020   Cystoid macular edema of right eye 04/24/2020   CKD (chronic kidney disease) stage 4, GFR 15-29 ml/min (HCC) 09/11/2017   Imbalance 09/16/2016   At risk for falls 09/16/2016   Muscle weakness 09/16/2016   Raynaud's disease without gangrene 06/10/2016   Lower leg edema 06/03/2016   Gout of multiple sites 06/03/2016   Hereditary and idiopathic peripheral neuropathy 06/03/2016   History of total knee replacement 10/06/2015   RBBB 09/06/2015   Preoperative cardiovascular examination 09/06/2015   Mild cognitive impairment with memory loss 09/04/2015   Loss of weight 09/04/2015   Primary osteoarthritis of right knee 07/15/2014   Xerosis cutis 07/15/2014   Superficial burn of groin 11/24/2013   B12 deficiency 09/23/2013   Essential hypertension, benign 09/23/2013   PVC's (premature ventricular contractions) 09/23/2013   Vitamin D deficiency 09/23/2013   Hyperglycemia 09/23/2013   DOE (dyspnea on exertion) 08/06/2013   Past Surgical History:  Procedure Laterality Date   NM MYOCAR PERF WALL MOTION  01/2008   lexiscan myoview - normal pattern of perfusion in all regions, EF 92%, no significant ischemia demonstated   TONSILLECTOMY  age 46   and adenoids   TOTAL KNEE ARTHROPLASTY Right 10/06/2015   Procedure: RIGHT TOTAL KNEE ARTHROPLASTY;  Surgeon: [name], MD;  Location: [facility];  Service: Orthopedics;  Laterality: Right;   TRANSTHORACIC ECHOCARDIOGRAM  12/2005  mild DUST, EF=>55%; mild mitral annular calcif; mild-mod aortic root calcif   Current Outpatient Medications on File Prior to Visit  Medication Sig Dispense Refill   amLODipine (NORVASC) 5 MG tablet Take 1 tablet (5 mg total) by mouth daily.     apixaban (ELIQUIS) 2.5 MG TABS tablet Take 1 tablet (2.5 mg total) by mouth 2 (two) times  daily. 60 tablet    feeding supplement, GLUCERNA SHAKE, (GLUCERNA SHAKE) LIQD Take 237 mLs by mouth 3 (three) times daily between meals.  0   ketorolac (ACULAR) 0.5 % ophthalmic solution INSTILL 1 DROP IN RIGHT EYE FOUR TIMES DAILY (Patient taking differently: Place 1 drop into the right eye 3 (three) times daily.) 5 mL 12   metoprolol tartrate (LOPRESSOR) 25 MG tablet Take 1 tablet (25 mg total) by mouth 2 (two) times daily.     Multiple Vitamins-Minerals (ONE-A-DAY PROACTIVE 65+) TABS Take 1 tablet by mouth daily with breakfast.     polyethylene glycol (MIRALAX / GLYCOLAX) 17 g packet Take 17 g by mouth daily. 14 each 0   senna-docusate (SENOKOT-S) 8.6-50 MG tablet Take 2 tablets by mouth 2 (two) times daily.     No current facility-administered medications on file prior to visit.    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   Drug use: No   Sexual activity: Never   Family History  Problem Relation Age of Onset   Congestive Heart Failure Mother    Alcohol abuse Father    Heart disease Son    Bipolar disorder Son    Lung cancer Brother    Post-traumatic stress disorder Daughter    Immunization History  Administered Date(s) Administered   Fluad Quad(high Dose 65+) 02/11/2019, 02/18/2020, 03/26/2021   Influenza, High Dose Seasonal PF 01/27/2016, 02/18/2018   Influenza,inj,Quad PF,6+ Mos 02/10/2017   Influenza-Unspecified 02/17/2013, 01/18/2014, 12/31/2014   Moderna Covid-19 Vaccine Bivalent Booster 6yrs & up  03/13/2021   Moderna Sars-Covid-2 Vaccination 06/07/2019, 07/05/2019, 04/11/2020   Pneumococcal Conjugate-13 07/15/2014   Pneumococcal Polysaccharide-23 05/20/2010   Tdap 07/09/2015   Zoster, Live 05/20/2010     Review of Systems: Negative except as noted in the HPI.   Objective: There were no vitals filed for this visit.  Cathryn Gallery is a pleasant 88 y.o. female in NAD. AAO X 3.  Vascular Examination: CFT <3 seconds b/l LE. Palpable pedal pulses b/l LE. Pedal hair sparse. No pain with calf compression b/l. Lower extremity skin temperature gradient within normal limits. Nonpitting edema noted BLE. No cyanosis or clubbing noted b/l LE.  Dermatological Examination: Pedal skin is warm and supple b/l LE. No open wounds b/l LE. No interdigital macerations noted b/l LE. Toenails 1-5 b/l elongated, discolored, dystrophic, thickened, crumbly with subungual debris and tenderness to dorsal palpation. Hyperkeratotic lesion(s) submet head 3 right foot, submet head 4 right foot, and 1st metatarsal head right foot.  No erythema,  no edema, no drainage, no fluctuance. Porokeratotic lesion(s) R 4th toe and R 5th toe. No erythema, no edema, no drainage, no fluctuance.  Musculoskeletal Examination: Muscle strength 5/5 to all lower extremity muscle groups bilaterally. No pain, crepitus or joint limitation noted with ROM bilateral LE. Hammertoe deformity noted 2-5 b/l. Utilizes wheelchair for mobility assistance.  Footwear Assessment: Does the patient wear appropriate shoes? Yes. Does the patient need inserts/orthotics? Yes.  Neurological Examination: Protective sensation diminished with 10g monofilament b/l.  Hemoglobin A1C Latest Ref Rng & Units 06/08/2021  HGBA1C 4.8 - 5.6 % 6.3(H)  Some recent data might be hidden   Assessment: 1. Pain due to onychomycosis of toenail   2. Callus   3. Porokeratosis   4. Acquired hammertoes of both feet   5. Diabetic peripheral neuropathy associated with type 2  diabetes mellitus ([place])   6. Encounter for diabetic foot exam ([place])     ADA Risk Categorization: High Risk  Patient has one or more of the following: Loss of protective sensation Absent pedal pulses Severe Foot deformity History of foot ulcer  Plan: -Diabetic foot examination performed today. -Continue foot and shoe inspections daily. Monitor blood glucose per PCP/Endocrinologist's recommendations. -Mycotic toenails 1-5 bilaterally were debrided in length and girth with sterile nail nippers and dremel without incident. -Callus(es) submet head 3 right foot, submet head 4 right foot, and 1st metatarsal head right foot pared utilizing sterile scalpel blade without complication or incident. Total number debrided =3. -Painful porokeratotic lesion(s) R 4th toe and R 5th toe pared and enucleated with sterile scalpel blade without incident. Total number of lesions debrided=2. -Dispensed foam toe separator. Apply to 4th webspace right foot every morning. Remove every evening. -Patient/POA to call should there be question/concern in the interim.  Return in about 3 months (around 09/29/2021).  Marzetta Board, DPM

## 2021-07-10 ENCOUNTER — Encounter (INDEPENDENT_AMBULATORY_CARE_PROVIDER_SITE_OTHER): Payer: Medicare PPO | Admitting: Ophthalmology

## 2021-07-17 ENCOUNTER — Inpatient Hospital Stay: Payer: Medicare PPO | Admitting: Adult Health

## 2021-07-19 ENCOUNTER — Encounter (INDEPENDENT_AMBULATORY_CARE_PROVIDER_SITE_OTHER): Payer: Self-pay | Admitting: Ophthalmology

## 2021-07-19 ENCOUNTER — Ambulatory Visit (INDEPENDENT_AMBULATORY_CARE_PROVIDER_SITE_OTHER): Payer: Medicare PPO | Admitting: Ophthalmology

## 2021-07-19 ENCOUNTER — Other Ambulatory Visit: Payer: Self-pay

## 2021-07-19 DIAGNOSIS — E119 Type 2 diabetes mellitus without complications: Secondary | ICD-10-CM | POA: Diagnosis not present

## 2021-07-19 DIAGNOSIS — H353132 Nonexudative age-related macular degeneration, bilateral, intermediate dry stage: Secondary | ICD-10-CM | POA: Diagnosis not present

## 2021-07-19 DIAGNOSIS — H35351 Cystoid macular degeneration, right eye: Secondary | ICD-10-CM | POA: Diagnosis not present

## 2021-07-19 NOTE — Assessment & Plan Note (Signed)
No active CME today, history of recurrences, on chronic suppressive use of ketorolac 1 drop right eye twice daily indefinitely ?

## 2021-07-19 NOTE — Assessment & Plan Note (Signed)
No detectable diabetic retinopathy 

## 2021-07-19 NOTE — Progress Notes (Signed)
07/19/2021     CHIEF COMPLAINT Patient presents for  Chief Complaint  Patient presents with   Cystoid Macular Edema      HISTORY OF PRESENT ILLNESS: Mia [name] is a 88 y.o. female who presents to the clinic today for:   HPI   7 mos fu ou oct. Pt states "my vision is worse in the left eye. I had a minor stroke about 2 months ago and it effected my left eye." Pt states she saw Dr. Lucianne Lei about 2 months ago, after the stroke, and she got a new glasses prescription but her glasses currently are not updated. Pt is using prescribed eye drops: Ketorolac OD, unsure how many times a day currently, states "must be about twice a day."  Confirm patient using ketorolac twice daily Last edited by Hurman Horn, MD on 07/19/2021  3:12 PM.      Referring physician: Lisabeth Pick, MD [address] [place],  [place] [address]  HISTORICAL INFORMATION:   Selected notes from the MEDICAL RECORD NUMBER    Lab Results  Component Value Date   HGBA1C 6.3 (H) 06/08/2021     CURRENT MEDICATIONS: Current Outpatient Medications (Ophthalmic Drugs)  Medication Sig   ketorolac (ACULAR) 0.5 % ophthalmic solution INSTILL 1 DROP IN RIGHT EYE FOUR TIMES DAILY (Patient taking differently: Place 1 drop into the right eye 3 (three) times daily.)   No current facility-administered medications for this visit. (Ophthalmic Drugs)   Current Outpatient Medications (Other)  Medication Sig   amLODipine (NORVASC) 5 MG tablet Take 1 tablet (5 mg total) by mouth daily.   apixaban (ELIQUIS) 2.5 MG TABS tablet Take 1 tablet (2.5 mg total) by mouth 2 (two) times daily.   feeding supplement, GLUCERNA SHAKE, (GLUCERNA SHAKE) LIQD Take 237 mLs by mouth 3 (three) times daily between meals.   metoprolol tartrate (LOPRESSOR) 25 MG tablet Take 1 tablet (25 mg total) by mouth 2 (two) times daily.   Multiple Vitamins-Minerals (ONE-A-DAY PROACTIVE 65+) TABS Take 1 tablet by mouth daily with breakfast.   polyethylene glycol  (MIRALAX / GLYCOLAX) 17 g packet Take 17 g by mouth daily.   senna-docusate (SENOKOT-S) 8.6-50 MG tablet Take 2 tablets by mouth 2 (two) times daily.   No current facility-administered medications for this visit. (Other)      REVIEW OF SYSTEMS: ROS   Positive for: Neurological Last edited by Hurman Horn, MD on 07/19/2021  3:12 PM.       ALLERGIES No Known Allergies  PAST MEDICAL HISTORY Past Medical History:  Diagnosis Date   Arthritis    oa   Chronic kidney disease    chronic kidney diseaase follow by primary md   Diabetes mellitus without complication ([place])    Diet controlled   Former smoker    H/O ventricular fibrillation 10 yrs ago dx   Hypertension    Prediabetes    Seasonal allergies    TIA (transient ischemic attack) none recent   Past Surgical History:  Procedure Laterality Date   NM MYOCAR PERF WALL MOTION  01/2008   lexiscan myoview - normal pattern of perfusion in all regions, EF 92%, no significant ischemia demonstated   TONSILLECTOMY  age 54   and adenoids   TOTAL KNEE ARTHROPLASTY Right 10/06/2015   Procedure: RIGHT TOTAL KNEE ARTHROPLASTY;  Surgeon: [name], MD;  Location: [facility];  Service: Orthopedics;  Laterality: Right;   TRANSTHORACIC ECHOCARDIOGRAM  12/2005   mild DUST, EF=>55%; mild mitral annular  calcif; mild-mod aortic root calcif    FAMILY HISTORY Family History  Problem Relation Age of Onset   Congestive Heart Failure Mother    Alcohol abuse Father    Heart disease Son    Bipolar disorder Son    Lung cancer Brother    Post-traumatic stress disorder Daughter     SOCIAL HISTORY Social History   Tobacco Use   Smoking status: Former    Packs/day: 0.25    Years: 40.00    Pack years: 10.00    Types: Cigarettes    Quit date: 07/30/2000    Years since quitting: 20.9   Smokeless tobacco: Never  Vaping Use   Vaping Use: Never used  Substance Use Topics   Alcohol use: Yes    Alcohol/week: 1.0 standard drink    Types:  1 Glasses of wine per week    Comment: every other day.   Drug use: No         OPHTHALMIC EXAM:  Base Eye Exam     Visual Acuity (ETDRS)       Right Left   Dist cc 20/30 -1 20/30    Correction: Glasses         Tonometry (Tonopen, 2:21 PM)       Right Left   Pressure 22 24         Pupils       Pupils Dark Light APD   Right PERRL 5 4 None   Left PERRL 5 4 None         Extraocular Movement       Right Left    Full Full         Neuro/Psych     Oriented x3: Yes   Mood/Affect: Normal         Dilation     Both eyes: 1.0% Mydriacyl, 2.5% Phenylephrine @ 2:21 PM           Slit Lamp and Fundus Exam     External Exam       Right Left   External Normal Normal         Slit Lamp Exam       Right Left   Lids/Lashes Normal Normal   Conjunctiva/Sclera White and quiet White and quiet   Cornea Clear Clear   Anterior Chamber Deep and quiet Deep and quiet   Iris Round and reactive Round and reactive   Lens Centered posterior chamber intraocular lens Centered posterior chamber intraocular lens   Anterior Vitreous Normal Normal         Fundus Exam       Right Left   Posterior Vitreous Posterior vitreous detachment Posterior vitreous detachment   Disc Normal Normal   C/D Ratio 0.35 0.35   Macula Hard drusen,  Hard drusen   Vessels Normal Normal   Periphery Normal Normal            IMAGING AND PROCEDURES  Imaging and Procedures for 07/19/21  OCT, Retina - OU - Both Eyes       Right Eye Quality was good. Scan locations included subfoveal. Central Foveal Thickness: 252. Progression has improved. Findings include abnormal foveal contour, retinal drusen .   Left Eye Quality was good. Scan locations included subfoveal. Central Foveal Thickness: 247. Progression has improved. Findings include abnormal foveal contour, retinal drusen .   Notes Resolved CME OD, status post consistent use of topical NSAID, ketorolac 2 times daily OD  will need to continue  ASSESSMENT/PLAN:  Cystoid macular edema of right eye No active CME today, history of recurrences, on chronic suppressive use of ketorolac 1 drop right eye twice daily indefinitely  Intermediate stage nonexudative age-related macular degeneration of both eyes Stable OU, no sign of CNVM  Type 2 diabetes mellitus without complication (HCC) No detectable diabetic retinopathy     ICD-10-CM   1. Cystoid macular edema of right eye  H35.351 OCT, Retina - OU - Both Eyes    2. Intermediate stage nonexudative age-related macular degeneration of both eyes  H35.3132     3. Type 2 diabetes mellitus without complication, unspecified whether long term insulin use (HCC)  E11.9       1.  OD with history of chronic and sometimes recurrent CME, currently not active.  2.  Stable in the past on ketorolac 2 and 3 times daily.  Patient compliantly using it twice daily now we will continue twice daily usage OD  3.  Intermediate ARMD OU, stable  4.  Follow-up Dr.  Lucianne Lei as scheduled  Ophthalmic Meds Ordered this visit:  No orders of the defined types were placed in this encounter.      Return in about 9 months (around 04/20/2022) for DILATE OU, OCT.  Patient Instructions  Okay for patient to use topical ketorolac to the right eye twice daily.  (gray or silver top)   Explained the diagnoses, plan, and follow up with the patient and they expressed understanding.  Patient expressed understanding of the importance of proper follow up care.   Clent Demark Caprice Wasko M.D. Diseases & Surgery of the Retina and Vitreous [facility] 07/19/21     Abbreviations: M myopia (nearsighted); A astigmatism; H hyperopia (farsighted); P presbyopia; Mrx spectacle prescription;  CTL contact lenses; OD right eye; OS left eye; OU both eyes  XT exotropia; ET esotropia; PEK punctate epithelial keratitis; PEE punctate epithelial erosions; DES dry eye syndrome; MGD  meibomian gland dysfunction; ATs artificial tears; PFAT's preservative free artificial tears; [place] nuclear sclerotic cataract; PSC posterior subcapsular cataract; ERM epi-retinal membrane; PVD posterior vitreous detachment; RD retinal detachment; DM diabetes mellitus; DR diabetic retinopathy; NPDR non-proliferative diabetic retinopathy; PDR proliferative diabetic retinopathy; CSME clinically significant macular edema; DME diabetic macular edema; dbh dot blot hemorrhages; CWS cotton wool spot; POAG primary open angle glaucoma; C/D cup-to-disc ratio; HVF humphrey visual field; GVF goldmann visual field; OCT optical coherence tomography; IOP intraocular pressure; BRVO Branch retinal vein occlusion; CRVO central retinal vein occlusion; CRAO central retinal artery occlusion; BRAO branch retinal artery occlusion; RT retinal tear; SB scleral buckle; PPV pars plana vitrectomy; VH Vitreous hemorrhage; PRP panretinal laser photocoagulation; IVK intravitreal kenalog; VMT vitreomacular traction; MH Macular hole;  NVD neovascularization of the disc; NVE neovascularization elsewhere; AREDS age related eye disease study; ARMD age related macular degeneration; POAG primary open angle glaucoma; EBMD epithelial/anterior basement membrane dystrophy; ACIOL anterior chamber intraocular lens; IOL intraocular lens; PCIOL posterior chamber intraocular lens; Phaco/IOL phacoemulsification with intraocular lens placement; [place] photorefractive keratectomy; LASIK laser assisted in situ keratomileusis; HTN hypertension; DM diabetes mellitus; COPD chronic obstructive pulmonary disease

## 2021-07-19 NOTE — Patient Instructions (Signed)
Okay for patient to use topical ketorolac to the right eye twice daily.  (gray or silver top) ?

## 2021-07-19 NOTE — Assessment & Plan Note (Signed)
Stable OU, no sign of CNVM ?

## 2021-09-22 ENCOUNTER — Emergency Department (HOSPITAL_COMMUNITY): Payer: Medicare PPO

## 2021-09-22 ENCOUNTER — Encounter (HOSPITAL_COMMUNITY): Payer: Self-pay | Admitting: Emergency Medicine

## 2021-09-22 ENCOUNTER — Emergency Department (HOSPITAL_COMMUNITY)
Admission: EM | Admit: 2021-09-22 | Discharge: 2021-09-22 | Disposition: A | Discharge status: Home / Self Care | Payer: Medicare PPO | Attending: Emergency Medicine | Admitting: Emergency Medicine

## 2021-09-22 DIAGNOSIS — N189 Chronic kidney disease, unspecified: Secondary | ICD-10-CM | POA: Insufficient documentation

## 2021-09-22 DIAGNOSIS — R531 Weakness: Secondary | ICD-10-CM | POA: Diagnosis not present

## 2021-09-22 DIAGNOSIS — R4781 Slurred speech: Secondary | ICD-10-CM | POA: Diagnosis not present

## 2021-09-22 DIAGNOSIS — Z7901 Long term (current) use of anticoagulants: Secondary | ICD-10-CM | POA: Insufficient documentation

## 2021-09-22 LAB — COMPREHENSIVE METABOLIC PANEL
ALT: 13 U/L (ref 0–44)
AST: 19 U/L (ref 15–41)
Albumin: 2.9 g/dL — ABNORMAL LOW (ref 3.5–5.0)
Alkaline Phosphatase: 74 U/L (ref 38–126)
Anion gap: 11 (ref 5–15)
BUN: 36 mg/dL — ABNORMAL HIGH (ref 8–23)
CO2: 21 mmol/L — ABNORMAL LOW (ref 22–32)
Calcium: 9.1 mg/dL (ref 8.9–10.3)
Chloride: 108 mmol/L (ref 98–111)
Creatinine, Ser: 2.01 mg/dL — ABNORMAL HIGH (ref 0.44–1.00)
GFR, Estimated: 23 mL/min — ABNORMAL LOW (ref >60)
Glucose, Bld: 125 mg/dL — ABNORMAL HIGH (ref 70–99)
Potassium: 4 mmol/L (ref 3.5–5.1)
Sodium: 140 mmol/L (ref 135–145)
Total Bilirubin: 0.7 mg/dL (ref 0.3–1.2)
Total Protein: 6.7 g/dL (ref 6.5–8.1)

## 2021-09-22 LAB — DIFFERENTIAL
Abs Immature Granulocytes: 0.05 10*3/uL (ref 0.00–0.07)
Basophils Absolute: 0 10*3/uL (ref 0.0–0.1)
Basophils Relative: 0 %
Eosinophils Absolute: 0.1 10*3/uL (ref 0.0–0.5)
Eosinophils Relative: 1 %
Immature Granulocytes: 1 %
Lymphocytes Relative: 8 %
Lymphs Abs: 0.8 10*3/uL (ref 0.7–4.0)
Monocytes Absolute: 0.9 10*3/uL (ref 0.1–1.0)
Monocytes Relative: 9 %
Neutro Abs: 8.1 10*3/uL — ABNORMAL HIGH (ref 1.7–7.7)
Neutrophils Relative %: 81 %

## 2021-09-22 LAB — IRON AND TIBC
Iron: 17 ug/dL — ABNORMAL LOW (ref 28–170)
Saturation Ratios: 7 % — ABNORMAL LOW (ref 10.4–31.8)
TIBC: 249 ug/dL — ABNORMAL LOW (ref 250–450)
UIBC: 232 ug/dL

## 2021-09-22 LAB — APTT: aPTT: 34 seconds (ref 24–36)

## 2021-09-22 LAB — FOLATE: Folate: 20.4 ng/mL (ref >5.9)

## 2021-09-22 LAB — CBC
HCT: 35.1 % — ABNORMAL LOW (ref 36.0–46.0)
Hemoglobin: 11.2 g/dL — ABNORMAL LOW (ref 12.0–15.0)
MCH: 29.5 pg (ref 26.0–34.0)
MCHC: 31.9 g/dL (ref 30.0–36.0)
MCV: 92.4 fL (ref 80.0–100.0)
Platelets: 294 10*3/uL (ref 150–400)
RBC: 3.8 MIL/uL — ABNORMAL LOW (ref 3.87–5.11)
RDW: 15.8 % — ABNORMAL HIGH (ref 11.5–15.5)
WBC: 10 10*3/uL (ref 4.0–10.5)
nRBC: 0 % (ref 0.0–0.2)

## 2021-09-22 LAB — RETICULOCYTES
Immature Retic Fract: 28.6 % — ABNORMAL HIGH (ref 2.3–15.9)
RBC.: 4.04 MIL/uL (ref 3.87–5.11)
Retic Count, Absolute: 42.4 10*3/uL (ref 19.0–186.0)
Retic Ct Pct: 1.1 % (ref 0.4–3.1)

## 2021-09-22 LAB — PROTIME-INR
INR: 1.5 — ABNORMAL HIGH (ref 0.8–1.2)
Prothrombin Time: 18.1 seconds — ABNORMAL HIGH (ref 11.4–15.2)

## 2021-09-22 LAB — POC OCCULT BLOOD, ED: Fecal Occult Bld: NEGATIVE

## 2021-09-22 LAB — VITAMIN B12: Vitamin B-12: 462 pg/mL (ref 180–914)

## 2021-09-22 LAB — FERRITIN: Ferritin: 180 ng/mL (ref 11–307)

## 2021-09-22 NOTE — ED Notes (Signed)
Patient transported to CT 

## 2021-09-22 NOTE — Discharge Instructions (Addendum)
Evaluated for weakness.  Your work-up is unremarkable.  Your CT head did not show anything acute.  Please follow-up with your primary care provider for reevaluation.  Thank you! ?

## 2021-09-22 NOTE — ED Provider Notes (Signed)
?[facility] ?Provider Note ? ? ?CSN: [id number] ?Arrival date & time: 09/22/21  1923 ? ?  ? ?History ? ?Chief Complaint  ?Patient presents with  ? Weakness  ? ? ?Mia [name] is a 88 y.o. female. ? ? ?Weakness ? ?Patient is a 88 year old female with multiple medical history including right bundle blanch block, CKD, history of TIA, and recent CVA in January 2023 who is coming from [facility] by EMS due to concern for right upper extremity weakness.  EMS report that the facility informed him that patient did have a right weakness that started about 0900.  At the same time she did have slurred speech and complained about blurry vision.  However, facility was not aware of this change until later this afternoon.  Due to concern for stroke and patient being concerned for stroke they called EMS.  EMS report patient has stated they did not find any acute weakness.  No intervention prior to coming to the emergency department.  Currently patient reports significant improvement of her weakness.  She denies any blurriness.  Per daughter and her son, her speech is nonslurred.  She denies abdominal pain, chest pain or shortness of breath.  Denies fever, cough or congestion.  Otherwise no other complaints. ? ?Home Medications ?Prior to Admission medications   ?Medication Sig Start Date End Date Taking? Authorizing Provider  ?amLODipine (NORVASC) 5 MG tablet Take 1 tablet (5 mg total) by mouth daily. 06/11/21   Bonnielee Haff, MD  ?apixaban (ELIQUIS) 2.5 MG TABS tablet Take 1 tablet (2.5 mg total) by mouth 2 (two) times daily. 06/11/21   Bonnielee Haff, MD  ?feeding supplement, GLUCERNA SHAKE, (GLUCERNA SHAKE) LIQD Take 237 mLs by mouth 3 (three) times daily between meals. 06/11/21   Bonnielee Haff, MD  ?ketorolac (ACULAR) 0.5 % ophthalmic solution INSTILL 1 DROP IN RIGHT EYE FOUR TIMES DAILY ?Patient taking differently: Place 1 drop into the right eye 3 (three) times daily. 04/30/21    Rankin, Clent Demark, MD  ?metoprolol tartrate (LOPRESSOR) 25 MG tablet Take 1 tablet (25 mg total) by mouth 2 (two) times daily. 06/11/21   Bonnielee Haff, MD  ?Multiple Vitamins-Minerals (ONE-A-DAY PROACTIVE 65+) TABS Take 1 tablet by mouth daily with breakfast.    [provider]  ?polyethylene glycol (MIRALAX / GLYCOLAX) 17 g packet Take 17 g by mouth daily. 06/11/21   Bonnielee Haff, MD  ?senna-docusate (SENOKOT-S) 8.6-50 MG tablet Take 2 tablets by mouth 2 (two) times daily. 06/11/21   Bonnielee Haff, MD  ?   ? ?Allergies    ?Patient has no known allergies.   ? ?Review of Systems   ?Review of Systems  ?Neurological:  Positive for weakness.  ? ?Physical Exam ?Updated Vital Signs ?BP 131/62   Pulse (!) 102   Temp 98.1 ?F (36.7 ?C) (Oral)   Resp 20   Ht 5\' 7"  (1.702 m)   Wt 55 kg   SpO2 95%   BMI 18.99 kg/m?  ?Physical Exam ?Constitutional:   ?   Comments: Chronically ill-appearing  ?HENT:  ?   Head: Normocephalic.  ?   Nose: No congestion.  ?   Mouth/Throat:  ?   Pharynx: Oropharynx is clear.  ?Eyes:  ?   Extraocular Movements: Extraocular movements intact.  ?   Pupils: Pupils are equal, round, and reactive to light.  ?Cardiovascular:  ?   Rate and Rhythm: Normal rate.  ?Pulmonary:  ?   Effort: Pulmonary effort is normal.  ?  Breath sounds: No wheezing.  ?Chest:  ?   Chest wall: No tenderness.  ?Abdominal:  ?   Tenderness: There is no guarding or rebound.  ?Musculoskeletal:     ?   General: No swelling, tenderness or deformity.  ?   Cervical back: Normal range of motion and neck supple. No tenderness.  ?   Comments: Right wrist in the flexed position.  Distally neurovascular intact.  No wrist tenderness.  ?Skin: ?   General: Skin is warm.  ?   Capillary Refill: Capillary refill takes less than 2 seconds.  ?Neurological:  ?   General: No focal deficit present.  ?   Mental Status: She is alert and oriented to person, place, and time. Mental status is at baseline.  ?   Sensory: No sensory deficit.  ?    Comments: No new focal neurological deficit.  Patient does have chronic right lower extremity weakness.  ? ? ?ED Results / Procedures / Treatments   ?Labs ?(all labs ordered are listed, but only abnormal results are displayed) ?Labs Reviewed  ?PROTIME-INR - Abnormal; Notable for the following components:  ?    Result Value  ? Prothrombin Time 18.1 (*)   ? INR 1.5 (*)   ? All other components within normal limits  ?CBC - Abnormal; Notable for the following components:  ? RBC 3.80 (*)   ? Hemoglobin 11.2 (*)   ? HCT 35.1 (*)   ? RDW 15.8 (*)   ? All other components within normal limits  ?DIFFERENTIAL - Abnormal; Notable for the following components:  ? Neutro Abs 8.1 (*)   ? All other components within normal limits  ?COMPREHENSIVE METABOLIC PANEL - Abnormal; Notable for the following components:  ? CO2 21 (*)   ? Glucose, Bld 125 (*)   ? BUN 36 (*)   ? Creatinine, Ser 2.01 (*)   ? Albumin 2.9 (*)   ? GFR, Estimated 23 (*)   ? All other components within normal limits  ?IRON AND TIBC - Abnormal; Notable for the following components:  ? Iron 17 (*)   ? TIBC 249 (*)   ? Saturation Ratios 7 (*)   ? All other components within normal limits  ?RETICULOCYTES - Abnormal; Notable for the following components:  ? Immature Retic Fract 28.6 (*)   ? All other components within normal limits  ?APTT  ?VITAMIN B12  ?FOLATE  ?FERRITIN  ?RAPID URINE DRUG SCREEN, HOSP PERFORMED  ?URINALYSIS, ROUTINE W REFLEX MICROSCOPIC  ?OCCULT BLOOD X 1 CARD TO LAB, STOOL  ?POC OCCULT BLOOD, ED  ? ? ?EKG ?EKG Interpretation ? ?Date/Time:  Saturday Sep 22 2021 20:34:00 EDT ?Ventricular Rate:  97 ?PR Interval:  133 ?QRS Duration: 128 ?QT Interval:  386 ?QTC Calculation: 491 ?R Axis:   -77 ?Text Interpretation: sinus with PAC Probable left atrial enlargement RBBB and LAFB Minimal ST elevation, lateral leads Confirmed by Aletta Edouard [phone]) on 09/22/2021 8:36:59 PM ? ?Radiology ?CT HEAD WO CONTRAST ? ?Result Date: 09/22/2021 ?CLINICAL DATA:  Slurred  speech and blurred vision. EXAM: CT HEAD WITHOUT CONTRAST TECHNIQUE: Contiguous axial images were obtained from the base of the skull through the vertex without intravenous contrast. RADIATION DOSE REDUCTION: This exam was performed according to the departmental dose-optimization program which includes automated exposure control, adjustment of the mA and/or kV according to patient size and/or use of iterative reconstruction technique. COMPARISON:  June 10, 2021 FINDINGS: Brain: There is moderate severity cerebral atrophy with widening of the extra-axial  spaces and ventricular dilatation. There are areas of decreased attenuation within the white matter tracts of the supratentorial brain, consistent with microvascular disease changes. Vascular: No hyperdense vessel or unexpected calcification. Skull: Normal. Negative for fracture or focal lesion. Sinuses/Orbits: No acute finding. Other: None. IMPRESSION: 1. No acute intracranial abnormality. 2. Generalized cerebral atrophy and microvascular disease changes of the supratentorial brain. Electronically Signed   By: Virgina Norfolk M.D.   On: 09/22/2021 20:13   ? ?Procedures ?Procedures  ? ?Medications Ordered in ED ?Medications - No data to display ? ?ED Course/ Medical Decision Making/ A&P ?Clinical Course as of 09/22/21 2359  ?Sat Sep 22, 2021  ?1937 She has brought in from her facility by ambulance for evaluation of left-sided weakness and some slurred speech that might of started at 9 AM.  Unclear if she woke up with symptoms or not.  Patient feels like symptoms have improved throughout the day.  She is otherwise well-appearing.  Some edema on her feet and has been complaining of some burning pain.  Getting labs EKG head CT [MB]  ?  ?Clinical Course User Index ?[MB] Hayden Rasmussen, MD  ? ?                        ?Medical Decision Making ?Problems Addressed: ?Weakness: acute illness or injury that poses a threat to life or bodily functions ? ?Amount and/or  Complexity of Data Reviewed ?Labs: ordered. Decision-making details documented in ED Course. ?Radiology: ordered and independent interpretation performed. Decision-making details documented in ED Course. ?ECG/medicine te

## 2021-09-22 NOTE — ED Notes (Signed)
Pt being driven to [facility] [place] by family ?

## 2021-09-22 NOTE — ED Notes (Signed)
ED provider at pt bedside, decided pt is not code stroke ?

## 2021-09-22 NOTE — ED Triage Notes (Signed)
Pt bib EMS from [facility], around 0900 began experiencing slurred speech,  blurred vision, and weakness to right hand. EMS not able to obtain LKW time, stroke assessment negative with EMS. Equal arm strength, no arm drift. No longer having weakness on right side. Hx stroke in January 2023.  ? ?DNR form at pt bedside with daughter.  ?

## 2021-09-22 NOTE — ED Notes (Signed)
E-signature pad unavailable at time of pt discharge. This RN discussed discharge materials with pt and answered all pt questions. Pt stated understanding of discharge material. ? ?

## 2021-10-01 ENCOUNTER — Ambulatory Visit: Payer: Medicare PPO | Admitting: Podiatry

## 2021-10-03 ENCOUNTER — Ambulatory Visit: Payer: Medicare PPO | Admitting: Family

## 2021-11-17 DEATH — deceased

## 2022-04-25 ENCOUNTER — Encounter (INDEPENDENT_AMBULATORY_CARE_PROVIDER_SITE_OTHER): Payer: Medicare PPO | Admitting: Ophthalmology
